# Patient Record
Sex: Male | Born: 1945
Health system: Southern US, Community
[De-identification: ages and names within clinical notes are randomized; demographics above are authoritative.]

## PROBLEM LIST (undated history)

## (undated) DIAGNOSIS — M109 Gout, unspecified: Secondary | ICD-10-CM

## (undated) DIAGNOSIS — E785 Hyperlipidemia, unspecified: Secondary | ICD-10-CM

## (undated) DIAGNOSIS — G20 Parkinson's disease: Secondary | ICD-10-CM

## (undated) DIAGNOSIS — G250 Essential tremor: Secondary | ICD-10-CM

## (undated) DIAGNOSIS — G20A1 Parkinson's disease without dyskinesia, without mention of fluctuations: Secondary | ICD-10-CM

## (undated) DIAGNOSIS — R809 Proteinuria, unspecified: Secondary | ICD-10-CM

## (undated) DIAGNOSIS — R7303 Prediabetes: Secondary | ICD-10-CM

## (undated) DIAGNOSIS — I10 Essential (primary) hypertension: Secondary | ICD-10-CM

## (undated) DIAGNOSIS — E559 Vitamin D deficiency, unspecified: Secondary | ICD-10-CM

## (undated) DIAGNOSIS — I2510 Atherosclerotic heart disease of native coronary artery without angina pectoris: Secondary | ICD-10-CM

## (undated) DIAGNOSIS — K259 Gastric ulcer, unspecified as acute or chronic, without hemorrhage or perforation: Secondary | ICD-10-CM

## (undated) HISTORY — DX: Gastric ulcer, unspecified as acute or chronic, without hemorrhage or perforation: K25.9

## (undated) HISTORY — PX: TONSILLECTOMY: SUR1361

## (undated) HISTORY — DX: Prediabetes: R73.03

## (undated) HISTORY — DX: Gout, unspecified: M10.9

## (undated) HISTORY — PX: CHOLECYSTECTOMY: SHX55

## (undated) HISTORY — DX: Vitamin D deficiency, unspecified: E55.9

## (undated) HISTORY — DX: Parkinson's disease without dyskinesia, without mention of fluctuations: G20.A1

## (undated) HISTORY — DX: Atherosclerotic heart disease of native coronary artery without angina pectoris: I25.10

## (undated) HISTORY — PX: CYST EXCISION: SHX5701

## (undated) HISTORY — DX: Parkinson's disease: G20

## (undated) HISTORY — DX: Proteinuria, unspecified: R80.9

## (undated) HISTORY — DX: Essential tremor: G25.0

## (undated) HISTORY — PX: KNEE SURGERY: SHX244

## (undated) HISTORY — PX: PACEMAKER IMPLANT: EP1218

---

## 2002-03-25 ENCOUNTER — Ambulatory Visit (HOSPITAL_COMMUNITY): Admission: RE | Admit: 2002-03-25 | Discharge: 2002-03-25 | Payer: Self-pay | Admitting: Gastroenterology

## 2004-02-19 ENCOUNTER — Ambulatory Visit (HOSPITAL_COMMUNITY): Admission: RE | Admit: 2004-02-19 | Discharge: 2004-02-19 | Payer: Self-pay | Admitting: Internal Medicine

## 2006-01-20 ENCOUNTER — Ambulatory Visit (HOSPITAL_BASED_OUTPATIENT_CLINIC_OR_DEPARTMENT_OTHER): Admission: RE | Admit: 2006-01-20 | Discharge: 2006-01-20 | Payer: Self-pay | Admitting: Surgery

## 2006-01-20 ENCOUNTER — Encounter (INDEPENDENT_AMBULATORY_CARE_PROVIDER_SITE_OTHER): Payer: Self-pay | Admitting: Specialist

## 2006-11-21 ENCOUNTER — Ambulatory Visit: Payer: Self-pay | Admitting: Cardiovascular Disease

## 2006-12-01 ENCOUNTER — Ambulatory Visit: Payer: Self-pay | Admitting: Cardiovascular Disease

## 2006-12-01 ENCOUNTER — Encounter: Payer: Self-pay | Admitting: Cardiology

## 2006-12-01 ENCOUNTER — Ambulatory Visit: Payer: Self-pay

## 2006-12-14 ENCOUNTER — Ambulatory Visit: Payer: Self-pay | Admitting: Cardiovascular Disease

## 2008-03-06 ENCOUNTER — Encounter: Admission: RE | Admit: 2008-03-06 | Discharge: 2008-03-06 | Payer: Self-pay | Admitting: Internal Medicine

## 2008-04-15 ENCOUNTER — Encounter: Admission: RE | Admit: 2008-04-15 | Discharge: 2008-04-15 | Payer: Self-pay | Admitting: *Deleted

## 2010-10-10 ENCOUNTER — Other Ambulatory Visit: Payer: Self-pay | Admitting: Family Medicine

## 2010-10-10 ENCOUNTER — Ambulatory Visit
Admission: RE | Admit: 2010-10-10 | Discharge: 2010-10-10 | Disposition: A | Discharge status: Home / Self Care | Payer: Self-pay | Source: Ambulatory Visit | Attending: Family Medicine | Admitting: Family Medicine

## 2010-10-10 ENCOUNTER — Encounter: Payer: Self-pay | Admitting: Family Medicine

## 2010-10-10 ENCOUNTER — Ambulatory Visit (INDEPENDENT_AMBULATORY_CARE_PROVIDER_SITE_OTHER): Payer: Self-pay | Admitting: Family Medicine

## 2010-10-10 DIAGNOSIS — M25562 Pain in left knee: Secondary | ICD-10-CM

## 2010-10-10 DIAGNOSIS — M25569 Pain in unspecified knee: Secondary | ICD-10-CM

## 2010-10-10 DIAGNOSIS — I10 Essential (primary) hypertension: Secondary | ICD-10-CM | POA: Insufficient documentation

## 2010-10-19 NOTE — Assessment & Plan Note (Signed)
Summary: Knee stiff/tm(rm3)   Vital Signs:  Patient Profile:   65 Years Old Male CC:      left knee pain Height:     215 inches Weight:      72 pounds O2 Sat:      99 % O2 treatment:    Room Air Temp:     97.5 degrees F oral Resp:     16 per minute BP sitting:   117 / 71  (left arm) Cuff size:   large  Pt. in pain?   yes    Location:   knee(left)    Intensity:   8    Type:       sharp  Vitals Entered By: Linton Flemings RN (October 10, 2010 3:41 PM)                   Updated Prior Medication List: GEMFIBROZIL  POWD (GEMFIBROZIL) twice daily ATENOLOL 50 MG TABS (ATENOLOL) daily COZAAR 100 MG TABS (LOSARTAN POTASSIUM) daily ZETIA 10 MG TABS (EZETIMIBE) daily AIRAVITE 2.5-25-1 MG TABS (FOLIC ACID-VIT B6-VIT B12)  maganesium 500mg  three times a day Vit D 2000 daily Vit B12 Current Allergies: ! ASAHistory of Present Illness Chief Complaint: left knee pain History of Present Illness:  Subjective:  Patient complains of onset of pain/stiffness in left knee 3 days ago after riding a new motorcycle for several hours.  Has pain when attempting to flex knee. + sensation of giving way.  Has been using crutches  REVIEW OF SYSTEMS Constitutional Symptoms      Denies fever, chills, night sweats, weight loss, weight gain, and fatigue.  Eyes       Denies change in vision, eye pain, eye discharge, glasses, contact lenses, and eye surgery. Ear/Nose/Throat/Mouth       Denies hearing loss/aids, change in hearing, ear pain, ear discharge, dizziness, frequent runny nose, frequent nose bleeds, sinus problems, sore throat, hoarseness, and tooth pain or bleeding.  Respiratory       Denies dry cough, productive cough, wheezing, shortness of breath, asthma, bronchitis, and emphysema/COPD.  Cardiovascular       Denies murmurs, chest pain, and tires easily with exhertion.    Gastrointestinal       Denies stomach pain, nausea/vomiting, diarrhea, constipation, blood in bowel movements, and  indigestion. Genitourniary       Denies painful urination, kidney stones, and loss of urinary control. Neurological       Denies paralysis, seizures, and fainting/blackouts. Musculoskeletal       Complains of joint stiffness.      Denies muscle pain, joint pain, decreased range of motion, redness, swelling, muscle weakness, and gout.  Skin       Denies bruising, unusual mles/lumps or sores, and hair/skin or nail changes.  Psych       Denies mood changes, temper/anger issues, anxiety/stress, speech problems, depression, and sleep problems. Other Comments: rode motorcycyle to [place], pain to left knee started friday night   Past History:  Past Medical History: Hyperlipidemia Hypertension  Past Surgical History: right knee x2  Family History: none  Social History: smoke- no alcohol- yes drugs-no   Objective:  No acute distress.  Walks slowly and deliberately; has difficulty bearing weight on left knee.  Left knee:  + mild effusion anteriorly.  No erythema or warmth.  + tenderness anteriorly over patella.   Patient cannot flex knee.  Cannot adequately assess knee because of patient's inabiltity to flex.  Distal neurovascular intact. X-ray left  knee:   IMPRESSION: No acute bony findings. Assessment New Problems: PATELLO-FEMORAL SYNDROME (ICD-719.46) KNEE PAIN, LEFT, ACUTE (ICD-719.46) HYPERTENSION (ICD-401.9) HYPERLIPIDEMIA (ICD-272.4)   Plan New Medications/Changes: VOLTAREN 1 % GEL (DICLOFENAC SODIUM) Apply four grams to affected area   three times a day to four times a day  #100gm x 2, 10/10/2010, Donna Christen MD  New Orders: T-DG Knee 2 Views*L* [73560] New Patient Level III [04540] Planning Comments:   Continue crutches for 3 to 5 days.  Continue applying ice packs several times daily.  Begin Voltaren gel.  Tylenol for pain at night.  When improved may use a neoprene knee sleeve.  When improved, begin knee exercises ([employer] information and  instruction patient handout given)  Follow-up with sports med clinic if not improving 2 weeks.   The patient and/or caregiver has been counseled thoroughly with regard to medications prescribed including dosage, schedule, interactions, rationale for use, and possible side effects and they verbalize understanding.  Diagnoses and expected course of recovery discussed and will return if not improved as expected or if the condition worsens. Patient and/or caregiver verbalized understanding.  Prescriptions: VOLTAREN 1 % GEL (DICLOFENAC SODIUM) Apply four grams to affected area   three times a day to four times a day  #100gm x 2   Entered and Authorized by:   Donna Christen MD   Signed by:   Donna Christen MD on 10/10/2010   Method used:   Print then Give to Patient   RxID:   [id number]   Orders Added: 1)  T-DG Knee 2 Views*L* [73560] 2)  New Patient Level III [08657]

## 2010-12-21 NOTE — Assessment & Plan Note (Signed)
[facility]                            CARDIOLOGY OFFICE NOTE   NAME:Barno, ELISHUA RADFORD                       MRN:          [id number]  DATE:12/14/2006                            DOB:          [birth year]    Pacen Watford was seen back in followup at the [facility] Office  on Dec 14, 2006.  He was initially evaluated here on November 21, 2006 for  exertional dyspnea.  He has underlying cardiac risk factors of  hypertension and dyslipidemia.  His physical exam and EKG were  unrevealing.  He underwent some diagnostic testing that included a chest  x-ray which showed no active lung disease.  He had a transthoracic  echocardiogram performed on December 01, 2006 that demonstrated normal left  ventricular systolic function with a left ventricular ejection fraction  in the range of 50-55% without regional wall motion abnormalities.  There was the mention of a density on the aortic valve that likely  represented artifact.  His diastolic filling parameters were normal.  He  also underwent an adenosine Myoview stress study on December 01, 2006 which  was negative for ischemia.  The left ventricular ejection fraction was  calculated at 56%.  There was no sign of scar of myocardial ischemia.   From a symptomatic standpoint, Mr. Gatliff is doing better.  He has  appreciated less dyspnea since his office visit.  He has not started his  exercise program because he wanted to make sure all of the tests had  good results prior to initiating regular exercise.  He has not had any  chest pain, orthopnea, PND, edema, or other complaints.   MEDICATIONS:  1. Colchicine 0.6 mg twice daily.  2. Hyzaar 100/25 mg daily.  3. Crestor 40 mg one-half daily.  4. Atenolol 50 mg daily.  5. Zetia 10 mg at bedtime.  6. Claritin 10 mg daily.  7. Cortisone cream 0.05% twice daily.  8. Fish oil 1200 mg, two daily.   ALLERGIES:  ASPIRIN.   PHYSICAL EXAMINATION:  GENERAL:  He is an alert and  oriented, well-  appearing male in no acute distress.  VITAL SIGNS:  Weight is 229 pounds.  Blood pressure 110/70, heart rate  68.   The remainder of the exam was deferred today, as our time today was  spent in discussion.   Mr. Mainville is a 65 year old man with mild shortness of breath that is  likely secondary to deconditioning.  He does not have any structural  heart disease.  His diastolic parameters on his echocardiogram are  normal, and he does not appear to have any signs of diastolic  dysfunction from hypertension.  His hypertension is under optimal  control on his current medication, and Dr. Oneta Rack is treating his  dyslipidemia aggressively as well.  We reviewed in detail today  approaches to a lifelong exercise program.  He certainly does not have  any evidence of cardiac disease and can confidently begin regular  exercise.   I would be happy to see Mr. Pryer back at any time if he develops  problems.  Otherwise,  I will plan on seeing him back just on an as  needed basis.     Veverly Fells. Excell Seltzer, MD     MDC/MedQ  DD: 12/14/2006  DT: 12/14/2006  Job #: [id number]   cc:   Lucky Cowboy, M.D.

## 2010-12-24 NOTE — Letter (Signed)
November 21, 2006    Lucky Cowboy, M.D.  [address], [address]  [place], [place] [address]   RE:  CRISTHIAN, VANHOOK  MRN:  [id number]  /  DOB:  Sep 24, 1945   Dear Dr. Oneta Rack:   It was my pleasure to see Leroy Griffin at the [facility]  in outpatient consultation on November 21, 2006.   As you know, Leroy Griffin is a 65 year old gentleman who presents here  with a chief complaint of shortness of breath.   Leroy Griffin has noted slowly progressive exertional dyspnea over the last  6-8 weeks. He has noticed this with activities such as mowing his lawn.  He occasionally has resting symptoms where his breathing feels  restricted. He does admit to some tightness in his chest but thinks this  may be related to his breathing. This does not seem to be a prominent  complaint for him.   He has not had any orthopnea, PND, edema, cough, fever or chills. He has  had no prolonged travel or recent surgeries.   He has lost a significant amount of weight over the last 1-1/2 years by  using [employer]. His weight has gone from 280 pounds down to 236  pounds. He informs me that he has a goal weight of 205-210 pounds. He  has no history of cardiac disease or cardiac problems. He was told by  his pediatrician when he was young that he had a heart murmur but this  has not been describes as an adult. He has no other complaints at  today's visit.   PAST MEDICAL HISTORY:  1. Essential hypertension. He has had hypertension for many years and      is on multi-drug therapy with good control. He reports blood      pressures generally in the range of 120/80.  2. Dyslipidemia. Currently on Crestor, Zetia and fish oil.  3. History of bleeding gastric ulcers related to nonsteroidal      antiinflammatory use.  4. Multiple right knee surgeries 5-10 years ago.  5. Cyst removal from the back approximately 1 year ago.  6. Arthritis.  7. Gout.  8. Hiatal hernia.   CURRENT MEDICATIONS:  1.  Colchicine 0.6 mg twice daily.  2. Hyzaar 100/25 mg daily.  3. Crestor daily.  4. Atenolol daily.  5. Zetia 10 mg at bedtime.  6. Fish oil 1000 mg 2 daily.  7. Claritin daily.  8. Cortisone cream twice daily.   ALLERGIES:  Aspirin and Nonsteroidals are listed but the intolerance is  due to bleeding ulcers an does not represent a true drug allergy.   SOCIAL HISTORY:  The patient is married with 1 child. He works as an  Banker for [employer]. He does not engage in regular  exercise. He is a remote smoker from the 33s. He has no history of  drug use and he drinks 1 alcoholic beverage daily. He drinks  approximately 2 cups of caffeinated beverages daily.   FAMILY HISTORY:  There is longevity in the family as his mother died at  age 62 of old age and father died at age 33. His father had a stroke at  age 70. He has 3 siblings who are in fairly good health. His 42 year old  brother has emphysema. His sisters have no medical problems and are ages  57 and 9. There is no coronary artery disease in the family.   REVIEW OF SYSTEMS:  A complete 12-point review of systems  was performed.  Pertinent positives included only aches and pains from arthritis and  gout as well as shortness of breath relating to his chief complaint. No  other pertinent positives to report.   PHYSICAL EXAMINATION:  GENERAL:  The patient is alert and oriented. He  is in no acute distress. He is a well-groomed gentleman.  VITAL SIGNS:  Weight is 236 pounds, blood pressure is 112/64, heart rate  is 56, respiratory rate is 12.  HEENT:  Normal.  NECK:  Normal carotid upstrokes without bruit. Jugular venous pressure  is normal with no thyromegaly or thyroid nodules.  LUNGS:  Clear to auscultation bilaterally.  HEART:  The apex is discreet and nondisplaced. There is no right  ventricular heave or lift. The heart is regular rate and rhythm without  murmurs or gallops.  ABDOMEN:  Soft, nontender, no  organomegaly, no masses, no abdominal  bruits.  BACK:  There is no paraspinal or flank tenderness.  EXTREMITIES:  No clubbing, cyanosis or edema. Peripheral pulses are 2+  and equal throughout. There are no femoral artery bruits.  SKIN:  Warm and dry without rash.  LYMPHATICS:  There is no adenopathy.  NEUROLOGIC:  Cranial nerves II-XII are intact. Strength is 5/5 and equal  in the arms and legs bilaterally.   EKG shows sinus bradycardia and is otherwise within normal limits.   ASSESSMENT:  Leroy Griffin is a 65 year old gentleman with exertional  dyspnea. Differential diagnosis is broad and includes myocardial  ischemia, left ventricular dysfunction or diastolic congestive heart  failure or a primary lung problem. Will start his evaluation with a  chest x-ray, echocardiogram and adenosine Myoview. If his echo and  Myoview study are normal, this would rule out any significant cardiac  component to his shortness of breath. He is at risk for coronary artery  disease in the setting of his risk factors of hypertension and  dyslipidemia even though he does not have classic symptoms. He is at  risk of diastolic heart failure in the setting of his hypertension  although it appears that his control is quite good. I will plan on  following up with Leroy Griffin after his studies are completed. I did not  detect any abnormalities on his physical examination today and his EKG  is normal. Both of these findings were reassuring.   Dr. Oneta Rack, thanks again for allowing me the opportunity to see Mr.  Griffin. Please feel free to call at any time with questions regarding  his care.    Sincerely,      Veverly Fells. Excell Seltzer, MD  Electronically Signed    MDC/MedQ  DD: 11/21/2006  DT: 11/21/2006  Job #: [id number]

## 2010-12-24 NOTE — Op Note (Signed)
   Leroy Griffin, Leroy Griffin                         ACCOUNT NO.:  [id number]   MEDICAL RECORD NO.:  [id number]                   PATIENT TYPE:  AMB   LOCATION:  ENDO                                 FACILITY:  [facility]   PHYSICIAN:  James L. Malon Kindle., M.D.          DATE OF BIRTH:  12/17/1945   DATE OF PROCEDURE:  03/25/2002  DATE OF DISCHARGE:                                 OPERATIVE REPORT   PROCEDURE:  Colonoscopy,.   MEDICATIONS:  Fentanyl 90 mcg, Versed 8 mg IV.   INDICATIONS FOR PROCEDURE:  Colon cancer screening.   DESCRIPTION OF PROCEDURE:  The procedure had been explained to the patient  and consent obtained. With the patient in the left lateral decubitus  position, the Olympus pediatric adjustable colonoscope was inserted and  advanced under direct visualization. The prep was excellent. We arrived at  the cecum without difficulty. The ileocecal valve and appendiceal orifice  were seen. The scope was withdrawn and the cecum, ascending colon, hepatic  flexure, transverse colon, splenic flexure, descending and sigmoid colon  were seen well. No polyps or other lesions were seen. The rectum was also  free of polyps. The scope was withdrawn and the patient tolerated the  procedure well.   ASSESSMENT:  1. Essentially normal colonoscopy.   PLAN:  Routine follow-up. Recommend yearly Hemoccults.  Consider colonoscopy  at age 4.                                               James L. Malon Kindle., M.D.    Waldron Session  D:  03/25/2002  T:  03/25/2002  Job:  [id number]   cc:   Marinus Maw, M.D.

## 2010-12-24 NOTE — Op Note (Signed)
Leroy Griffin, Leroy Griffin                ACCOUNT NO.:  [id number]   MEDICAL RECORD NO.:  [id number]          PATIENT TYPE:  AMB   LOCATION:  DSC                          FACILITY:  [facility]   PHYSICIAN:  Wilmon Arms. Corliss Skains, M.D. DATE OF BIRTH:  1946-07-03   DATE OF PROCEDURE:  01/20/2006  DATE OF DISCHARGE:                                 OPERATIVE REPORT   PREOPERATIVE DIAGNOSIS:  1.  Sebaceous cyst scalp.  2.  Chronic infected sebaceous cyst mid-back.   POSTOPERATIVE DIAGNOSIS:  1.  Sebaceous cyst scalp.  2.  Chronic infected sebaceous cyst mid-back.   PROCEDURE PERFORMED:  Excision of sebaceous cyst from the scalp and the mid-  back.   SURGEON:  Wilmon Arms. Tsuei, M.D.   ANESTHESIA:  General endotracheal.   INDICATIONS:  The patient is a 65 year old male in excellent health who  recently had a sebaceous cyst on his mid-back that became infected.  This  was drained and the inflammation has resolved.  He also has had a cyst on  the posterior right scalp for some time.  He requests excision of both of  these.   DESCRIPTION OF PROCEDURE:  The patient is brought to the operating room and  placed in supine position on stretcher.  After adequate general anesthesia  was obtained he is rotated to a prone position.  A small area of hair was  shaved around the scalp cyst.  This area was prepped with Betadine and  draped in sterile fashion.  An incision was made directly over the cyst  carried down to the capsule of cyst.  This was bluntly dissected free and  was delivered intact.  Hemostasis obtained with cautery.  The wound was then  closed with interrupted 4-0 Ethilon sutures.  The back was then prepped with  Betadine and draped in sterile fashion.  An elliptical incision was made  around the skin opening.  Dissection was carried down into the subcutaneous  tissues.  Cautery was used to excise the entire inflamed scarred cyst back  to healthy tissue.  Hemostasis was obtained with cautery.   The wound was  closed with 4-0 Ethilon.  Antibiotic ointment clean dressings were applied.  The patient was then extubated and brought to recovery room in stable  condition.      Wilmon Arms. Tsuei, M.D.  Electronically Signed     MKT/MEDQ  D:  01/20/2006  T:  01/20/2006  Job:  [id number]

## 2011-03-07 ENCOUNTER — Other Ambulatory Visit (HOSPITAL_COMMUNITY): Payer: Self-pay | Admitting: Internal Medicine

## 2011-03-07 ENCOUNTER — Ambulatory Visit (HOSPITAL_COMMUNITY)
Admission: RE | Admit: 2011-03-07 | Discharge: 2011-03-07 | Disposition: A | Discharge status: Home / Self Care | Payer: Medicare Other | Source: Ambulatory Visit | Attending: Internal Medicine | Admitting: Internal Medicine

## 2011-03-07 DIAGNOSIS — M545 Low back pain, unspecified: Secondary | ICD-10-CM | POA: Insufficient documentation

## 2011-03-07 DIAGNOSIS — R52 Pain, unspecified: Secondary | ICD-10-CM

## 2011-08-15 DIAGNOSIS — M109 Gout, unspecified: Secondary | ICD-10-CM | POA: Diagnosis not present

## 2011-08-15 DIAGNOSIS — I10 Essential (primary) hypertension: Secondary | ICD-10-CM | POA: Diagnosis not present

## 2011-09-06 DIAGNOSIS — R7309 Other abnormal glucose: Secondary | ICD-10-CM | POA: Diagnosis not present

## 2011-09-06 DIAGNOSIS — M109 Gout, unspecified: Secondary | ICD-10-CM | POA: Diagnosis not present

## 2011-09-06 DIAGNOSIS — E782 Mixed hyperlipidemia: Secondary | ICD-10-CM | POA: Diagnosis not present

## 2011-09-06 DIAGNOSIS — I10 Essential (primary) hypertension: Secondary | ICD-10-CM | POA: Diagnosis not present

## 2011-09-06 DIAGNOSIS — Z79899 Other long term (current) drug therapy: Secondary | ICD-10-CM | POA: Diagnosis not present

## 2011-09-06 DIAGNOSIS — E559 Vitamin D deficiency, unspecified: Secondary | ICD-10-CM | POA: Diagnosis not present

## 2011-10-25 DIAGNOSIS — M109 Gout, unspecified: Secondary | ICD-10-CM | POA: Diagnosis not present

## 2011-10-25 DIAGNOSIS — R809 Proteinuria, unspecified: Secondary | ICD-10-CM | POA: Diagnosis not present

## 2011-11-01 DIAGNOSIS — R809 Proteinuria, unspecified: Secondary | ICD-10-CM | POA: Diagnosis not present

## 2011-11-21 DIAGNOSIS — I10 Essential (primary) hypertension: Secondary | ICD-10-CM | POA: Diagnosis not present

## 2011-11-21 DIAGNOSIS — E782 Mixed hyperlipidemia: Secondary | ICD-10-CM | POA: Diagnosis not present

## 2011-11-21 DIAGNOSIS — M109 Gout, unspecified: Secondary | ICD-10-CM | POA: Diagnosis not present

## 2011-11-21 DIAGNOSIS — R7309 Other abnormal glucose: Secondary | ICD-10-CM | POA: Diagnosis not present

## 2011-11-21 DIAGNOSIS — E559 Vitamin D deficiency, unspecified: Secondary | ICD-10-CM | POA: Diagnosis not present

## 2011-11-21 DIAGNOSIS — Z79899 Other long term (current) drug therapy: Secondary | ICD-10-CM | POA: Diagnosis not present

## 2011-11-25 DIAGNOSIS — R809 Proteinuria, unspecified: Secondary | ICD-10-CM | POA: Diagnosis not present

## 2012-01-09 DIAGNOSIS — R002 Palpitations: Secondary | ICD-10-CM | POA: Diagnosis not present

## 2012-01-09 DIAGNOSIS — I10 Essential (primary) hypertension: Secondary | ICD-10-CM | POA: Diagnosis not present

## 2012-01-09 DIAGNOSIS — E782 Mixed hyperlipidemia: Secondary | ICD-10-CM | POA: Diagnosis not present

## 2012-01-09 DIAGNOSIS — R809 Proteinuria, unspecified: Secondary | ICD-10-CM | POA: Diagnosis not present

## 2012-01-09 DIAGNOSIS — M109 Gout, unspecified: Secondary | ICD-10-CM | POA: Diagnosis not present

## 2012-01-18 DIAGNOSIS — R809 Proteinuria, unspecified: Secondary | ICD-10-CM | POA: Diagnosis not present

## 2012-02-03 LAB — HM COLONOSCOPY

## 2012-03-22 DIAGNOSIS — R5381 Other malaise: Secondary | ICD-10-CM | POA: Diagnosis not present

## 2012-03-22 DIAGNOSIS — E782 Mixed hyperlipidemia: Secondary | ICD-10-CM | POA: Diagnosis not present

## 2012-03-22 DIAGNOSIS — R5383 Other fatigue: Secondary | ICD-10-CM | POA: Diagnosis not present

## 2012-03-22 DIAGNOSIS — L639 Alopecia areata, unspecified: Secondary | ICD-10-CM | POA: Diagnosis not present

## 2012-03-22 DIAGNOSIS — E559 Vitamin D deficiency, unspecified: Secondary | ICD-10-CM | POA: Diagnosis not present

## 2012-03-22 DIAGNOSIS — E538 Deficiency of other specified B group vitamins: Secondary | ICD-10-CM | POA: Diagnosis not present

## 2012-03-22 DIAGNOSIS — L259 Unspecified contact dermatitis, unspecified cause: Secondary | ICD-10-CM | POA: Diagnosis not present

## 2012-03-22 DIAGNOSIS — I10 Essential (primary) hypertension: Secondary | ICD-10-CM | POA: Diagnosis not present

## 2012-03-22 DIAGNOSIS — R7309 Other abnormal glucose: Secondary | ICD-10-CM | POA: Diagnosis not present

## 2012-03-22 DIAGNOSIS — D649 Anemia, unspecified: Secondary | ICD-10-CM | POA: Diagnosis not present

## 2012-03-22 DIAGNOSIS — M109 Gout, unspecified: Secondary | ICD-10-CM | POA: Diagnosis not present

## 2012-04-06 DIAGNOSIS — R809 Proteinuria, unspecified: Secondary | ICD-10-CM | POA: Diagnosis not present

## 2012-05-04 DIAGNOSIS — R809 Proteinuria, unspecified: Secondary | ICD-10-CM | POA: Diagnosis not present

## 2012-06-11 ENCOUNTER — Emergency Department (INDEPENDENT_AMBULATORY_CARE_PROVIDER_SITE_OTHER)
Admission: EM | Admit: 2012-06-11 | Discharge: 2012-06-11 | Disposition: A | Discharge status: Home / Self Care | Payer: Medicare Other | Source: Home / Self Care | Attending: Family Medicine | Admitting: Family Medicine

## 2012-06-11 ENCOUNTER — Emergency Department (INDEPENDENT_AMBULATORY_CARE_PROVIDER_SITE_OTHER): Payer: Medicare Other

## 2012-06-11 ENCOUNTER — Ambulatory Visit (INDEPENDENT_AMBULATORY_CARE_PROVIDER_SITE_OTHER): Payer: Medicare Other | Admitting: Sports Medicine

## 2012-06-11 ENCOUNTER — Encounter: Payer: Self-pay | Admitting: Emergency Medicine

## 2012-06-11 DIAGNOSIS — R209 Unspecified disturbances of skin sensation: Secondary | ICD-10-CM

## 2012-06-11 DIAGNOSIS — M25512 Pain in left shoulder: Secondary | ICD-10-CM | POA: Insufficient documentation

## 2012-06-11 DIAGNOSIS — M25519 Pain in unspecified shoulder: Secondary | ICD-10-CM | POA: Diagnosis not present

## 2012-06-11 DIAGNOSIS — M75100 Unspecified rotator cuff tear or rupture of unspecified shoulder, not specified as traumatic: Secondary | ICD-10-CM

## 2012-06-11 DIAGNOSIS — R200 Anesthesia of skin: Secondary | ICD-10-CM

## 2012-06-11 DIAGNOSIS — M67919 Unspecified disorder of synovium and tendon, unspecified shoulder: Secondary | ICD-10-CM | POA: Diagnosis not present

## 2012-06-11 DIAGNOSIS — M898X9 Other specified disorders of bone, unspecified site: Secondary | ICD-10-CM

## 2012-06-11 DIAGNOSIS — M719 Bursopathy, unspecified: Secondary | ICD-10-CM | POA: Diagnosis not present

## 2012-06-11 HISTORY — DX: Hyperlipidemia, unspecified: E78.5

## 2012-06-11 HISTORY — DX: Essential (primary) hypertension: I10

## 2012-06-11 MED ORDER — TRAMADOL HCL 50 MG PO TABS: 50.0000 mg | ORAL_TABLET | Freq: Four times a day (QID) | ORAL | Status: DC | PRN

## 2012-06-11 NOTE — Assessment & Plan Note (Signed)
Symptoms are classic for rotator cuff dysfunction. I do think his subscapularis and supraspinatus are involved. We'll start conservatively with rehabilitation exercises, and tramadol. He is unable to take NSAIDs due to GI bleeding requiring hospitalization and transfusions in the past. If he is no better at his followup visit, we will ultrasound, and possibly inject the subacromial bursa.

## 2012-06-11 NOTE — Progress Notes (Signed)
SPORTS MEDICINE CONSULTATION REPORT   Subjective:    I'm seeing this patient as a consultation for:  Dr. Cathren Harsh  CC: Left shoulder pain  HPI: Leroy Griffin is a very pleasant 66 year old male comes in with a one-year history of pain that he localizes over his left lateral deltoid. This pain does not wake him up at night, he is unable to reach overhead. The pain is localized, does not radiate, is moderate in nature. He denies any associated injuries. He's never been treated for this before.  Numbness and tingling: Notes numbness and tingling for many years in the last 2 fingers. He does note that he has a known cervical degenerative disc disease. He is agreeable to discuss this at a future visit.  Past medical history, Surgical history, Family history, Social history, Allergies, and medications have been entered into the medical record, reviewed, and no changes needed.   Review of Systems: No headache, visual changes, nausea, vomiting, diarrhea, constipation, dizziness, abdominal pain, skin rash, fevers, chills, night sweats, weight loss, swollen lymph nodes, body aches, joint swelling, muscle aches, chest pain, or shortness of breath.   Objective:   Vitals:  Afebrile, vital signs stable. General: Well Developed, well nourished, and in no acute distress.  Neuro/Psych: Alert and oriented x3, extra-ocular muscles intact, able to move all 4 extremities.  Skin: Warm and dry, no rashes noted.  Respiratory: Not using accessory muscles, speaking in full sentences, trachea midline.  Cardiovascular: Pulses palpable, no extremity edema. Abdomen: Does not appear distended. Left Shoulder: Inspection reveals no abnormalities, atrophy or asymmetry. Palpation is normal with no tenderness over AC joint or bicipital groove. ROM is limited in internal rotation to lower lumbar. Rotator cuff strength weak to abduction, internal rotation. Resisted internal rotation is very painful with a positive liftoff  sign. Positive Hawkins, and if they can sign with weakness, negative Neer sign. Speeds test is positive, Yergason test negative. No labral pathology noted with negative Obrien's, negative clunk and good stability. Normal scapular function observed. Positive painful arc without a drop arm sign No apprehension sign  X-rays reviewed by me, she'll mild glenohumeral degenerative changes, and some subacromial spurring, but type I acromion. No other signs of fracture dislocation. A small piece of wire was noted in his mid lateral upper arm, he was not tender over this area   Impression and Recommendations:   This case required medical decision making of moderate complexity.

## 2012-06-11 NOTE — ED Notes (Signed)
Left shoulder pain x 5 days.

## 2012-06-11 NOTE — Assessment & Plan Note (Signed)
Symptoms are suggestive of a bilateral C8 radiculopathy. With a history of cervical degenerative disc disease, and bilateral symptoms I would be worried about cervical spinal stenosis other than a lateralizing disc protrusion.

## 2012-06-11 NOTE — ED Provider Notes (Signed)
History     CSN: [id number]  Arrival date & time 06/11/12  1059   First MD Initiated Contact with Patient 06/11/12 1136      Chief Complaint  Patient presents with  . Shoulder Pain      HPI Comments: Patient states that about 5 days ago he was filling a gas tank, and later noticed pain and decrease range of motion in his left shoulder that has persisted.  He is left-handed, and states that he has had increasing frequency of episodes of pain in his left shoulder that generally improve spontaneously.  His last episode of left shoulder pain was about 6 weeks ago.  He recalls no injury.  Patient is a 66 y.o. male presenting with shoulder pain. The history is provided by the patient.  Shoulder Pain This is a recurrent problem. Episode onset: 5 days ago. The problem occurs constantly. The problem has been gradually worsening. Associated symptoms comments: none. Exacerbated by: raising his left arm. Nothing relieves the symptoms. Treatments tried: heat. The treatment provided no relief.    Past Medical History  Diagnosis Date  . Hypertension   . Hyperlipidemia     Past Surgical History  Procedure Date  . Rt knee     Family History  Problem Relation Age of Onset  . Dementia Mother   . Stroke Father     History  Substance Use Topics  . Smoking status: Never Smoker   . Smokeless tobacco: Not on file  . Alcohol Use: Yes      Review of Systems  All other systems reviewed and are negative.    Allergies  Aspirin  Home Medications   Current Outpatient Rx  Name  Route  Sig  Dispense  Refill  . ALLOPURINOL 300 MG PO TABS   Oral   Take 300 mg by mouth daily.         . ATENOLOL 50 MG PO TABS   Oral   Take 50 mg by mouth daily.         Marland Kitchen EZETIMIBE 10 MG PO TABS   Oral   Take 10 mg by mouth daily.         Marland Kitchen LOSARTAN POTASSIUM 50 MG PO TABS   Oral   Take 50 mg by mouth daily.         Marland Kitchen ROSUVASTATIN CALCIUM 10 MG PO TABS   Oral   Take 10 mg by mouth  daily.         . TRAMADOL HCL 50 MG PO TABS   Oral   Take 1 tablet (50 mg total) by mouth every 6 (six) hours as needed for pain.   50 tablet   2     BP 129/74  Pulse 60  Temp 97.7 F (36.5 C) (Oral)  Resp 16  Ht 6' (1.829 m)  Wt 225 lb (102.059 kg)  BMI 30.52 kg/m2  SpO2 97%  Physical Exam  Nursing note and vitals reviewed. Constitutional: He is oriented to person, place, and time. He appears well-developed and well-nourished. No distress.  HENT:  Head: Atraumatic.  Eyes: Conjunctivae normal are normal. Pupils are equal, round, and reactive to light.  Neck: Normal range of motion.  Cardiovascular: Normal rate, regular rhythm and normal heart sounds.   Pulmonary/Chest: Effort normal and breath sounds normal.  Musculoskeletal:       Left shoulder: He exhibits decreased range of motion, tenderness, pain and decreased strength. He exhibits no bony tenderness, no swelling, no effusion,  no crepitus, no deformity, no laceration, no spasm and normal pulse.       Patient has significant difficulty abducting his left shoulder above horizontal.  There is tenderness over the insertion of long head of biceps tendon.  He has difficulty with Aply's test.  Lift off test is positive for subscapularis dysfunction.  Empty can test is positive.  Distal Neurovascular function is intact.   Neurological: He is alert and oriented to person, place, and time.  Skin: Skin is warm and dry. No rash noted.    ED Course  Procedures none   Dg Shoulder Left  06/11/2012  *RADIOLOGY REPORT*  Clinical Data: Progressive chronic left shoulder pain.  Decreased range of motion.  LEFT SHOULDER - 2+ VIEW  Comparison: None.  Findings: Glenohumeral joint appears normal.  No soft tissue calcifications.  There is a small osteophyte on the inferior surface of the distal clavicle.  AC joint is otherwise normal.  There is a broken wire fragment in the soft tissues of the posterior aspect of the left upper arm.   IMPRESSION: Minimal osteophyte formation on the distal clavicle.  Osseous structures are otherwise normal.  Broken wire fragment in the soft tissues of the posterior aspect of the left upper arm.   Original Report Authenticated By: Francene Boyers, M.D.      1. Rotator cuff syndrome       MDM  Because of the patient's increasing frequency of episodes of shoulder pain, and significantly decreased left shoulder function today, will obtain consultation with Dr. Rodney Langton for more comprehensive management.        Lattie Haw, MD 06/12/12 1700

## 2012-06-15 DIAGNOSIS — R809 Proteinuria, unspecified: Secondary | ICD-10-CM | POA: Diagnosis not present

## 2012-06-19 DIAGNOSIS — Z23 Encounter for immunization: Secondary | ICD-10-CM | POA: Diagnosis not present

## 2012-06-19 DIAGNOSIS — E782 Mixed hyperlipidemia: Secondary | ICD-10-CM | POA: Diagnosis not present

## 2012-06-19 DIAGNOSIS — Z79899 Other long term (current) drug therapy: Secondary | ICD-10-CM | POA: Diagnosis not present

## 2012-06-19 DIAGNOSIS — I10 Essential (primary) hypertension: Secondary | ICD-10-CM | POA: Diagnosis not present

## 2012-06-19 DIAGNOSIS — Z125 Encounter for screening for malignant neoplasm of prostate: Secondary | ICD-10-CM | POA: Diagnosis not present

## 2012-06-19 DIAGNOSIS — M109 Gout, unspecified: Secondary | ICD-10-CM | POA: Diagnosis not present

## 2012-06-19 DIAGNOSIS — E559 Vitamin D deficiency, unspecified: Secondary | ICD-10-CM | POA: Diagnosis not present

## 2012-06-19 DIAGNOSIS — R7309 Other abnormal glucose: Secondary | ICD-10-CM | POA: Diagnosis not present

## 2012-06-19 DIAGNOSIS — Z1212 Encounter for screening for malignant neoplasm of rectum: Secondary | ICD-10-CM | POA: Diagnosis not present

## 2012-06-20 DIAGNOSIS — L639 Alopecia areata, unspecified: Secondary | ICD-10-CM | POA: Diagnosis not present

## 2012-06-27 DIAGNOSIS — I10 Essential (primary) hypertension: Secondary | ICD-10-CM | POA: Diagnosis not present

## 2012-06-27 DIAGNOSIS — R809 Proteinuria, unspecified: Secondary | ICD-10-CM | POA: Diagnosis not present

## 2012-07-09 ENCOUNTER — Ambulatory Visit (INDEPENDENT_AMBULATORY_CARE_PROVIDER_SITE_OTHER): Payer: Medicare Other | Admitting: Sports Medicine

## 2012-07-09 ENCOUNTER — Encounter: Payer: Self-pay | Admitting: Sports Medicine

## 2012-07-09 VITALS — BP 129/76 | HR 69 | Wt 220.0 lb

## 2012-07-09 DIAGNOSIS — M25519 Pain in unspecified shoulder: Secondary | ICD-10-CM

## 2012-07-09 DIAGNOSIS — M25512 Pain in left shoulder: Secondary | ICD-10-CM

## 2012-07-09 NOTE — Assessment & Plan Note (Signed)
Pain resolved with ultrasound-guided subacromial bursa injection. This confirms the rotator cuff is the pain generator. He should take it easy for a week, but continue the rehabilitation exercises. I will see him back in 4 weeks. He cannot take NSAIDs due to prior GI bleeds.

## 2012-07-09 NOTE — Progress Notes (Signed)
SPORTS MEDICINE CONSULTATION REPORT  Subjective:    CC: Followup  HPI: This pleasant patient comes back after about 4 weeks of rotator cuff rehabilitation for his left shoulder. He has been unable to use NSAIDs due to prior GI bleeds. Overall he notes he is about 50% better, but this does not work he wants to be. He also has some radicular type symptoms, but exam at my initial consultation was predominantly rotator cuff. The pain is localized over the left deltoid, worse with overhead activities, and reaching behind his back. Pain does not radiate, is moderate.  Past medical history, Surgical history, Family history, Social history, Allergies, and medications have been entered into the medical record, reviewed, and no changes needed.   Review of Systems: No headache, visual changes, nausea, vomiting, diarrhea, constipation, dizziness, abdominal pain, skin rash, fevers, chills, night sweats, weight loss, swollen lymph nodes, body aches, joint swelling, muscle aches, chest pain, shortness of breath, mood changes, visual or auditory hallucinations.   Objective:   Vitals:  Afebrile, vital signs stable. General: Well Developed, well nourished, and in no acute distress.  Neuro/Psych: Alert and oriented x3, extra-ocular muscles intact, able to move all 4 extremities.  Skin: Warm and dry, no rashes noted.  Respiratory: Not using accessory muscles, speaking in full sentences, trachea midline.  Cardiovascular: Pulses palpable, no extremity edema. Abdomen: Does not appear distended. Left Shoulder: Inspection reveals no abnormalities, atrophy or asymmetry. Palpation is normal with no tenderness over AC joint or bicipital groove. ROM is limited in internal rotation to lower lumbar. Rotator cuff strength weak to abduction, internal rotation. Resisted internal rotation is very painful with a positive liftoff sign. Positive Hawkins, and if they can sign with weakness, negative Neer sign. Speeds test is  positive, Yergason test negative. No labral pathology noted with negative Obrien's, negative clunk and good stability. Normal scapular function observed. Positive painful arc without a drop arm sign No apprehension sign  Procedure: Real-time Ultrasound Guided Injection of left subacromial bursa Device: GE Logiq E  Ultrasound guided injection is preferred based studies that show increased duration, increased effect, greater accuracy, decreased procedural pain, increased response rate, and decreased cost with ultrasound guided versus blind injection.  Verbal informed consent obtained.  Time-out conducted.  Noted no overlying erythema, induration, or other signs of local infection.  Skin prepped in a sterile fashion.  Local anesthesia: Topical Ethyl chloride.  With sterile technique and under real time ultrasound guidance:  1 cc Kenalog 40, 4 cc lidocaine injected into the bursa. Completed without difficulty  Pain immediately resolved suggesting accurate placement of the medication.  Advised to call if fevers/chills, erythema, induration, drainage, or persistent bleeding.  Images permanently stored and available for review in the ultrasound unit.  Impression: Technically successful ultrasound guided injection.  Impression and Recommendations:   This case required medical decision making of moderate complexity.

## 2012-07-25 DIAGNOSIS — L639 Alopecia areata, unspecified: Secondary | ICD-10-CM | POA: Diagnosis not present

## 2012-08-09 ENCOUNTER — Encounter: Payer: Self-pay | Admitting: Sports Medicine

## 2012-08-09 ENCOUNTER — Ambulatory Visit (INDEPENDENT_AMBULATORY_CARE_PROVIDER_SITE_OTHER): Payer: Medicare Other | Admitting: Sports Medicine

## 2012-08-09 VITALS — BP 138/85 | HR 50 | Wt 218.0 lb

## 2012-08-09 DIAGNOSIS — S46219A Strain of muscle, fascia and tendon of other parts of biceps, unspecified arm, initial encounter: Secondary | ICD-10-CM

## 2012-08-09 DIAGNOSIS — M25519 Pain in unspecified shoulder: Secondary | ICD-10-CM | POA: Diagnosis not present

## 2012-08-09 DIAGNOSIS — M25512 Pain in left shoulder: Secondary | ICD-10-CM

## 2012-08-09 NOTE — Progress Notes (Signed)
SPORTS MEDICINE CONSULTATION REPORT  Subjective:    CC: Followup  HPI: Left shoulder pain: Pain is 80% resolved, he has no pain at night, and no pain at rest after ultrasound-guided subacromial injection. He is very happy with the results.  Left biceps tendon rupture: Interestingly, 3 weeks ago he reached up with his left hand to swat a flight from the ceiling. He felt a pop, a short period of immediate pain, which then resolved. And then had some bruising. He now has a lump in his distal upper arm. He denies pain, and denies any loss of function.  Past medical history, Surgical history, Family history, Social history, Allergies, and medications have been entered into the medical record, reviewed, and no changes needed.   Review of Systems: No headache, visual changes, nausea, vomiting, diarrhea, constipation, dizziness, abdominal pain, skin rash, fevers, chills, night sweats, weight loss, swollen lymph nodes, body aches, joint swelling, muscle aches, chest pain, shortness of breath, mood changes, visual or auditory hallucinations.   Objective:   Vitals:  Afebrile, vital signs stable. General: Well Developed, well nourished, and in no acute distress.  Neuro/Psych: Alert and oriented x3, extra-ocular muscles intact, able to move all 4 extremities.  Skin: Warm and dry, no rashes noted.  Respiratory: Not using accessory muscles, speaking in full sentences, trachea midline.  Cardiovascular: Pulses palpable, no extremity edema. Abdomen: Does not appear distended. Left Shoulder: Inspection reveals no abnormalities, atrophy or asymmetry. Palpation is normal with no tenderness over AC joint or bicipital groove. ROM is full in all planes. Rotator cuff strength normal throughout. No signs of impingement with negative Neer and Hawkin's tests, empty can sign. Speeds and Yergason's tests normal. No labral pathology noted with negative Obrien's, negative clunk and good stability. Normal scapular  function observed. No painful arc and no drop arm sign. No apprehension sign Positive Popeye's sign with bruising in the distal upper arm. He has excellent strength elbow flexion, and is slightly weak to forearm supination.  Impression and Recommendations:   This case required medical decision making of moderate complexity.

## 2012-08-09 NOTE — Assessment & Plan Note (Signed)
80% resolved s/p injection. Continue rehabilitation exercises. Come back as needed.

## 2012-08-09 NOTE — Assessment & Plan Note (Signed)
With Popeye deformity Still has excellent strength to flexion, related to brachialis strength. This does not need surgical intervention, and will likely remain asymptomatic. Of note, he did rupture the right side 5 years ago.

## 2012-08-23 ENCOUNTER — Emergency Department (INDEPENDENT_AMBULATORY_CARE_PROVIDER_SITE_OTHER)
Admission: EM | Admit: 2012-08-23 | Discharge: 2012-08-23 | Disposition: A | Discharge status: Home / Self Care | Payer: BC Managed Care – PPO | Source: Home / Self Care | Attending: Family Medicine | Admitting: Family Medicine

## 2012-08-23 ENCOUNTER — Encounter: Payer: Self-pay | Admitting: *Deleted

## 2012-08-23 ENCOUNTER — Emergency Department (INDEPENDENT_AMBULATORY_CARE_PROVIDER_SITE_OTHER): Payer: Medicare Other

## 2012-08-23 DIAGNOSIS — M79609 Pain in unspecified limb: Secondary | ICD-10-CM

## 2012-08-23 DIAGNOSIS — S6990XA Unspecified injury of unspecified wrist, hand and finger(s), initial encounter: Secondary | ICD-10-CM | POA: Diagnosis not present

## 2012-08-23 DIAGNOSIS — M7989 Other specified soft tissue disorders: Secondary | ICD-10-CM

## 2012-08-23 DIAGNOSIS — M11839 Other specified crystal arthropathies, unspecified wrist: Secondary | ICD-10-CM

## 2012-08-23 DIAGNOSIS — M25539 Pain in unspecified wrist: Secondary | ICD-10-CM

## 2012-08-23 DIAGNOSIS — S63509A Unspecified sprain of unspecified wrist, initial encounter: Secondary | ICD-10-CM | POA: Diagnosis not present

## 2012-08-23 DIAGNOSIS — M1990 Unspecified osteoarthritis, unspecified site: Secondary | ICD-10-CM

## 2012-08-23 DIAGNOSIS — S59909A Unspecified injury of unspecified elbow, initial encounter: Secondary | ICD-10-CM | POA: Diagnosis not present

## 2012-08-23 DIAGNOSIS — W19XXXA Unspecified fall, initial encounter: Secondary | ICD-10-CM

## 2012-08-23 NOTE — ED Notes (Signed)
Patient reports falling yesterday and landing on left hand. Swelling and pain present, limited ROM on ulnar side. Used ice. Possible previous injury.

## 2012-08-23 NOTE — ED Provider Notes (Signed)
History     CSN: [id number]  Arrival date & time 08/23/12  1127   First MD Initiated Contact with Patient 08/23/12 1130      Chief Complaint  Patient presents with  . Hand Injury    left    HPI L hand and wrist pain x 2 days. Pt fell and landed on hand yesterday  Has significant pain and swelling since this point.  No numbness or paresthesias.  Decreased ROM 2/2 pain.   Past Medical History  Diagnosis Date  . Hypertension   . Hyperlipidemia     Past Surgical History  Procedure Date  . Rt knee     Family History  Problem Relation Age of Onset  . Dementia Mother   . Stroke Father     History  Substance Use Topics  . Smoking status: Never Smoker   . Smokeless tobacco: Not on file  . Alcohol Use: Yes      Review of Systems  All other systems reviewed and are negative.    Allergies  Aspirin  Home Medications   Current Outpatient Rx  Name  Route  Sig  Dispense  Refill  . ALLOPURINOL 300 MG PO TABS   Oral   Take 300 mg by mouth daily.         . ATENOLOL 50 MG PO TABS   Oral   Take 50 mg by mouth daily.         Marland Kitchen VITAMIN D 1000 UNITS PO TABS   Oral   Take 1,000 Units by mouth daily.         Marland Kitchen DIAZEPAM 5 MG PO TABS   Oral   Take 5 mg by mouth every 8 (eight) hours as needed.         Marland Kitchen EZETIMIBE 10 MG PO TABS   Oral   Take 10 mg by mouth daily.         . OMEGA-3 FATTY ACIDS 1000 MG PO CAPS   Oral   Take 2 g by mouth 2 (two) times daily.         Marland Kitchen FLAX SEED OIL 1000 MG PO CAPS   Oral   Take 1,000 mg by mouth daily.         Marland Kitchen LOSARTAN POTASSIUM 50 MG PO TABS   Oral   Take 50 mg by mouth daily.         Marland Kitchen MAGNESIUM GLUCONATE 500 MG PO TABS   Oral   Take 500 mg by mouth 2 (two) times daily.         Marland Kitchen PROPRANOLOL HCL ER 120 MG PO CP24   Oral   Take 120 mg by mouth daily.         Marland Kitchen ROSUVASTATIN CALCIUM 10 MG PO TABS   Oral   Take 20 mg by mouth daily.          . TRAMADOL HCL 50 MG PO TABS   Oral   Take 1  tablet (50 mg total) by mouth every 6 (six) hours as needed for pain.   50 tablet   2   . VITAMIN B-12 1000 MCG PO TABS   Oral   Take 1,000 mcg by mouth daily.         Marland Kitchen ZINC GLUCONATE 50 MG PO TABS   Oral   Take 50 mg by mouth daily.           BP 150/71  Pulse 63  Resp 12  Ht  6' (1.829 m)  Wt 220 lb (99.791 kg)  BMI 29.84 kg/m2  SpO2 96%  Physical Exam  Constitutional: He appears well-developed and well-nourished.  HENT:  Head: Normocephalic and atraumatic.  Eyes: Conjunctivae normal are normal. Pupils are equal, round, and reactive to light.  Neck: Normal range of motion. Neck supple.  Cardiovascular: Normal rate, regular rhythm and normal heart sounds.   Pulmonary/Chest: Effort normal.  Abdominal: Soft.  Musculoskeletal:       Hands:      Generalized swelling along dorsal aspect of L wrist.  Decreased ROM 2/2 pain  + ttp over distribution of ulnar styloid.    Neurological: He is alert.  Skin: Skin is warm.    ED Course  Procedures (including critical care time)  Labs Reviewed - No data to display Dg Wrist Complete Left  08/23/2012  *RADIOLOGY REPORT*  Clinical Data: Hand injury post fall  LEFT WRIST - COMPLETE 3+ VIEW  Comparison: None.  Findings: Four views of the left wrist submitted.  No acute fracture or subluxation.  No radiopaque foreign body.  IMPRESSION: No acute fracture or subluxation.   Original Report Authenticated By: Natasha Mead, M.D.    Dg Hand Complete Left  08/23/2012  *RADIOLOGY REPORT*  Clinical Data: Fall yesterday with swelling and pain.  LEFT HAND - COMPLETE 3+ VIEW  Comparison: None.  Findings: Dorsal soft tissue swelling at the level of the distal metacarpals and metacarpal phalangeal joints. No acute fracture or dislocation.  Degenerative change at the radiocarpal joints and distal radial ulnar joint.  There is a suggestion of calcification in the triangular fibrocartilage.  IMPRESSION: Dorsal soft tissue swelling, without acute osseous  abnormality.  Osteoarthritis.  Suspect  calcification in the triangular fibrocartilage indicative of calcium pyrophosphate deposition disease.   Original Report Authenticated By: Jeronimo Greaves, M.D.      1. 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 OA (osteoarthritis)       MDM  xrays negative for fracture.  Wrist brace placed.  Discussed RICE and tylenol (avoid NSAIDs in setting of hx/o ASA related GI bleeding). Noted CPPD disease in [facility]. Suspect this is an incidental finding.  Pt with prior hx/o pseudogout flares in the past. Pain is not similar.  Will hold on treatment.  Follow up with PCP if pain persists.     The patient and/or caregiver has been counseled thoroughly with regard to treatment plan and/or medications prescribed including dosage, schedule, interactions, rationale for use, and possible side effects and they verbalize understanding. Diagnoses and expected course of recovery discussed and will return if not improved as expected or if the condition worsens. Patient and/or caregiver verbalized understanding.               Doree Albee, MD 08/23/12 1258

## 2012-09-04 DIAGNOSIS — L639 Alopecia areata, unspecified: Secondary | ICD-10-CM | POA: Diagnosis not present

## 2012-09-26 DIAGNOSIS — H2510 Age-related nuclear cataract, unspecified eye: Secondary | ICD-10-CM | POA: Diagnosis not present

## 2012-10-02 DIAGNOSIS — I10 Essential (primary) hypertension: Secondary | ICD-10-CM | POA: Diagnosis not present

## 2012-10-02 DIAGNOSIS — M79609 Pain in unspecified limb: Secondary | ICD-10-CM | POA: Diagnosis not present

## 2012-10-02 DIAGNOSIS — R7309 Other abnormal glucose: Secondary | ICD-10-CM | POA: Diagnosis not present

## 2012-10-02 DIAGNOSIS — E782 Mixed hyperlipidemia: Secondary | ICD-10-CM | POA: Diagnosis not present

## 2012-10-02 DIAGNOSIS — E559 Vitamin D deficiency, unspecified: Secondary | ICD-10-CM | POA: Diagnosis not present

## 2012-10-02 DIAGNOSIS — Z79899 Other long term (current) drug therapy: Secondary | ICD-10-CM | POA: Diagnosis not present

## 2012-10-15 DIAGNOSIS — R809 Proteinuria, unspecified: Secondary | ICD-10-CM | POA: Diagnosis not present

## 2012-10-16 DIAGNOSIS — L639 Alopecia areata, unspecified: Secondary | ICD-10-CM | POA: Diagnosis not present

## 2012-10-16 DIAGNOSIS — R809 Proteinuria, unspecified: Secondary | ICD-10-CM | POA: Diagnosis not present

## 2012-10-23 DIAGNOSIS — M1990 Unspecified osteoarthritis, unspecified site: Secondary | ICD-10-CM | POA: Diagnosis not present

## 2012-10-23 DIAGNOSIS — R809 Proteinuria, unspecified: Secondary | ICD-10-CM | POA: Diagnosis not present

## 2012-10-23 DIAGNOSIS — I10 Essential (primary) hypertension: Secondary | ICD-10-CM | POA: Diagnosis not present

## 2012-11-08 DIAGNOSIS — Z1211 Encounter for screening for malignant neoplasm of colon: Secondary | ICD-10-CM | POA: Diagnosis not present

## 2012-11-21 DIAGNOSIS — M48061 Spinal stenosis, lumbar region without neurogenic claudication: Secondary | ICD-10-CM | POA: Diagnosis not present

## 2012-11-22 ENCOUNTER — Other Ambulatory Visit: Payer: Self-pay | Admitting: Neurological Surgery

## 2012-11-22 DIAGNOSIS — M48061 Spinal stenosis, lumbar region without neurogenic claudication: Secondary | ICD-10-CM

## 2012-11-28 ENCOUNTER — Ambulatory Visit
Admission: RE | Admit: 2012-11-28 | Discharge: 2012-11-28 | Disposition: A | Discharge status: Home / Self Care | Payer: Medicare Other | Source: Ambulatory Visit | Attending: Neurological Surgery | Admitting: Neurological Surgery

## 2012-11-28 DIAGNOSIS — M47817 Spondylosis without myelopathy or radiculopathy, lumbosacral region: Secondary | ICD-10-CM | POA: Diagnosis not present

## 2012-11-28 DIAGNOSIS — M48061 Spinal stenosis, lumbar region without neurogenic claudication: Secondary | ICD-10-CM

## 2012-11-28 DIAGNOSIS — M4310 Spondylolisthesis, site unspecified: Secondary | ICD-10-CM | POA: Diagnosis not present

## 2012-12-05 DIAGNOSIS — M48061 Spinal stenosis, lumbar region without neurogenic claudication: Secondary | ICD-10-CM | POA: Diagnosis not present

## 2013-01-02 DIAGNOSIS — M109 Gout, unspecified: Secondary | ICD-10-CM | POA: Diagnosis not present

## 2013-01-02 DIAGNOSIS — I10 Essential (primary) hypertension: Secondary | ICD-10-CM | POA: Diagnosis not present

## 2013-01-02 DIAGNOSIS — E782 Mixed hyperlipidemia: Secondary | ICD-10-CM | POA: Diagnosis not present

## 2013-01-02 DIAGNOSIS — R7309 Other abnormal glucose: Secondary | ICD-10-CM | POA: Diagnosis not present

## 2013-01-02 DIAGNOSIS — Z79899 Other long term (current) drug therapy: Secondary | ICD-10-CM | POA: Diagnosis not present

## 2013-02-26 DIAGNOSIS — R809 Proteinuria, unspecified: Secondary | ICD-10-CM | POA: Diagnosis not present

## 2013-03-14 DIAGNOSIS — R809 Proteinuria, unspecified: Secondary | ICD-10-CM | POA: Diagnosis not present

## 2013-03-14 DIAGNOSIS — E785 Hyperlipidemia, unspecified: Secondary | ICD-10-CM | POA: Diagnosis not present

## 2013-03-14 DIAGNOSIS — I10 Essential (primary) hypertension: Secondary | ICD-10-CM | POA: Diagnosis not present

## 2013-05-06 DIAGNOSIS — M79609 Pain in unspecified limb: Secondary | ICD-10-CM | POA: Diagnosis not present

## 2013-05-06 DIAGNOSIS — R7309 Other abnormal glucose: Secondary | ICD-10-CM | POA: Diagnosis not present

## 2013-05-06 DIAGNOSIS — I10 Essential (primary) hypertension: Secondary | ICD-10-CM | POA: Diagnosis not present

## 2013-05-06 DIAGNOSIS — Z79899 Other long term (current) drug therapy: Secondary | ICD-10-CM | POA: Diagnosis not present

## 2013-05-06 DIAGNOSIS — E559 Vitamin D deficiency, unspecified: Secondary | ICD-10-CM | POA: Diagnosis not present

## 2013-05-06 DIAGNOSIS — E782 Mixed hyperlipidemia: Secondary | ICD-10-CM | POA: Diagnosis not present

## 2013-05-14 DIAGNOSIS — L639 Alopecia areata, unspecified: Secondary | ICD-10-CM | POA: Diagnosis not present

## 2013-05-23 DIAGNOSIS — Z23 Encounter for immunization: Secondary | ICD-10-CM | POA: Diagnosis not present

## 2013-06-25 DIAGNOSIS — L639 Alopecia areata, unspecified: Secondary | ICD-10-CM | POA: Diagnosis not present

## 2013-06-26 ENCOUNTER — Encounter: Payer: Self-pay | Admitting: Internal Medicine

## 2013-08-14 DIAGNOSIS — L639 Alopecia areata, unspecified: Secondary | ICD-10-CM | POA: Diagnosis not present

## 2013-08-26 DIAGNOSIS — R7309 Other abnormal glucose: Secondary | ICD-10-CM | POA: Insufficient documentation

## 2013-08-26 DIAGNOSIS — E559 Vitamin D deficiency, unspecified: Secondary | ICD-10-CM | POA: Insufficient documentation

## 2013-08-26 DIAGNOSIS — M109 Gout, unspecified: Secondary | ICD-10-CM | POA: Insufficient documentation

## 2013-08-27 ENCOUNTER — Encounter: Payer: Self-pay | Admitting: Internal Medicine

## 2013-08-27 ENCOUNTER — Other Ambulatory Visit: Payer: Self-pay | Admitting: Internal Medicine

## 2013-08-27 ENCOUNTER — Ambulatory Visit (INDEPENDENT_AMBULATORY_CARE_PROVIDER_SITE_OTHER): Payer: Medicare Other | Admitting: Internal Medicine

## 2013-08-27 VITALS — BP 132/76 | HR 64 | Temp 98.8°F | Resp 18 | Wt 228.0 lb

## 2013-08-27 DIAGNOSIS — G250 Essential tremor: Secondary | ICD-10-CM | POA: Insufficient documentation

## 2013-08-27 DIAGNOSIS — E559 Vitamin D deficiency, unspecified: Secondary | ICD-10-CM

## 2013-08-27 DIAGNOSIS — R809 Proteinuria, unspecified: Secondary | ICD-10-CM | POA: Insufficient documentation

## 2013-08-27 DIAGNOSIS — G259 Extrapyramidal and movement disorder, unspecified: Secondary | ICD-10-CM | POA: Insufficient documentation

## 2013-08-27 DIAGNOSIS — I10 Essential (primary) hypertension: Secondary | ICD-10-CM | POA: Diagnosis not present

## 2013-08-27 DIAGNOSIS — E782 Mixed hyperlipidemia: Secondary | ICD-10-CM | POA: Diagnosis not present

## 2013-08-27 DIAGNOSIS — Z79899 Other long term (current) drug therapy: Secondary | ICD-10-CM | POA: Diagnosis not present

## 2013-08-27 DIAGNOSIS — R7309 Other abnormal glucose: Secondary | ICD-10-CM

## 2013-08-27 LAB — HEPATIC FUNCTION PANEL
ALT: 21 U/L (ref 0–53)
AST: 29 U/L (ref 0–37)
Albumin: 4 g/dL (ref 3.5–5.2)
Alkaline Phosphatase: 45 U/L (ref 39–117)
Bilirubin, Direct: 0.2 mg/dL (ref 0.0–0.3)
Indirect Bilirubin: 0.7 mg/dL (ref 0.0–0.9)
Total Bilirubin: 0.9 mg/dL (ref 0.3–1.2)
Total Protein: 6.8 g/dL (ref 6.0–8.3)

## 2013-08-27 LAB — CBC WITH DIFFERENTIAL/PLATELET
Basophils Absolute: 0 10*3/uL (ref 0.0–0.1)
Basophils Relative: 1 % (ref 0–1)
Eosinophils Absolute: 0.1 10*3/uL (ref 0.0–0.7)
Eosinophils Relative: 3 % (ref 0–5)
HCT: 48.1 % (ref 39.0–52.0)
Hemoglobin: 16.6 g/dL (ref 13.0–17.0)
Lymphocytes Relative: 34 % (ref 12–46)
Lymphs Abs: 1.5 10*3/uL (ref 0.7–4.0)
MCH: 34.2 pg — ABNORMAL HIGH (ref 26.0–34.0)
MCHC: 34.5 g/dL (ref 30.0–36.0)
MCV: 99.2 fL (ref 78.0–100.0)
Monocytes Absolute: 0.5 10*3/uL (ref 0.1–1.0)
Monocytes Relative: 12 % (ref 3–12)
Neutro Abs: 2.3 10*3/uL (ref 1.7–7.7)
Neutrophils Relative %: 50 % (ref 43–77)
Platelets: 157 10*3/uL (ref 150–400)
RBC: 4.85 MIL/uL (ref 4.22–5.81)
RDW: 14.4 % (ref 11.5–15.5)
WBC: 4.4 10*3/uL (ref 4.0–10.5)

## 2013-08-27 LAB — BASIC METABOLIC PANEL WITH GFR
BUN: 18 mg/dL (ref 6–23)
CO2: 28 mEq/L (ref 19–32)
Calcium: 10.3 mg/dL (ref 8.4–10.5)
Chloride: 103 mEq/L (ref 96–112)
Creat: 0.87 mg/dL (ref 0.50–1.35)
GFR, Est African American: >89 mL/min
GFR, Est Non African American: 88 mL/min
Glucose, Bld: 80 mg/dL (ref 70–99)
Potassium: 4.4 mEq/L (ref 3.5–5.3)
Sodium: 139 mEq/L (ref 135–145)

## 2013-08-27 LAB — LIPID PANEL
Cholesterol: 205 mg/dL — ABNORMAL HIGH (ref 0–200)
HDL: 84 mg/dL (ref >39)
LDL Cholesterol: 94 mg/dL (ref 0–99)
Total CHOL/HDL Ratio: 2.4 Ratio
Triglycerides: 133 mg/dL (ref <150)
VLDL: 27 mg/dL (ref 0–40)

## 2013-08-27 LAB — MAGNESIUM: Magnesium: 1.4 mg/dL — ABNORMAL LOW (ref 1.5–2.5)

## 2013-08-27 LAB — HEMOGLOBIN A1C
Hgb A1c MFr Bld: 5.7 % — ABNORMAL HIGH (ref <5.7)
Mean Plasma Glucose: 117 mg/dL — ABNORMAL HIGH (ref <117)

## 2013-08-27 LAB — TSH: TSH: 1.716 u[IU]/mL (ref 0.350–4.500)

## 2013-08-27 MED ORDER — LOSARTAN POTASSIUM 100 MG PO TABS: 50.0000 mg | ORAL_TABLET | Freq: Two times a day (BID) | ORAL | Status: DC

## 2013-08-27 NOTE — Progress Notes (Signed)
Patient ID: Leroy Griffin, male   DOB: 26-Nov-1945, 68 y.o.   MRN: [id number]   This very nice 68 y.o. MWM presents for 3 month follow up with Hypertension, Hyperlipidemia, Pre-Diabetes and Vitamin D Deficiency.    HTN predates since 69. BP has been controlled at home. Today's BP: 132/76 mmHg.  He also has been followed by Dr Caryn Section for Probable Benign Proteinuria. Patient denies any cardiac type chest pain, palpitations, dyspnea/orthopnea/PND, dizziness, claudication, or dependent edema.   Hyperlipidemia is controlled with diet & meds. Last Cholesterol was 202, Triglycerides were 133, HDL 64 and LDL 111. Patient denies myalgias or other med SE's.    Also, the patient has history of PreDiabetes with last A1c of 5.6% in Sept 2014. Patient denies any symptoms of reactive hypoglycemia, diabetic polys, paresthesias or visual blurring.   Further, Patient has history of Vitamin D Deficiency of 11 in 2009 with last vitamin D of   75 in Sept 2014. Patient supplements vitamin D without any suspected side-effects.    Medication List       acetaminophen 500 MG tablet  Commonly known as:  TYLENOL  Take 500 mg by mouth every 6 (six) hours as needed.     acetaminophen-codeine 300-30 MG per tablet  Commonly known as:  TYLENOL #3     allopurinol 300 MG tablet  Commonly known as:  ZYLOPRIM  Take 300 mg by mouth daily. Takes 1/2 tab daily     cholecalciferol 1000 UNITS tablet  Commonly known as:  VITAMIN D  Take 1,000 Units by mouth daily.     diazepam 5 MG tablet  Commonly known as:  VALIUM  Take 5 mg by mouth every 8 (eight) hours as needed.     fish oil-omega-3 fatty acids 1000 MG capsule  Take 2 g by mouth 2 (two) times daily.     Flax Seed Oil 1000 MG Caps  Take 1,000 mg by mouth daily.     losartan 100 MG tablet  Commonly known as:  COZAAR  Take 0.5 tablets (50 mg total) by mouth 2 (two) times daily.     magnesium gluconate 500 MG tablet  Commonly known as:  MAGONATE  Take 500 mg by  mouth 2 (two) times daily.     propranolol ER 120 MG 24 hr capsule  Commonly known as:  INDERAL LA  Take 120 mg by mouth daily.     rosuvastatin 20 MG tablet  Commonly known as:  CRESTOR  Take 20 mg by mouth daily.     vitamin B-12 1000 MCG tablet  Commonly known as:  CYANOCOBALAMIN  Take 1,000 mcg by mouth daily.     zinc gluconate 50 MG tablet  Take 50 mg by mouth daily.         Allergies  Allergen Reactions  . Aspirin     Internal bleeding  . Welchol [Colesevelam Hcl] Diarrhea  . Vasotec [Enalaprilat] Rash    PMHx:   Past Medical History  Diagnosis Date  . Hypertension   . Hyperlipidemia   . Prediabetes   . Gout   . Vitamin D deficiency     FHx:    Reviewed / unchanged  SHx:    Reviewed / unchanged  Systems Review: Constitutional: Denies fever, chills, wt changes, headaches, insomnia, fatigue, night sweats, change in appetite. Eyes: Denies redness, blurred vision, diplopia, discharge, itchy, watery eyes.  ENT: Denies discharge, congestion, post nasal drip, epistaxis, sore throat, earache, hearing loss, dental pain, tinnitus, vertigo,  sinus pain, snoring.  CV: Denies chest pain, palpitations, irregular heartbeat, syncope, dyspnea, diaphoresis, orthopnea, PND, claudication, edema. Respiratory: denies cough, dyspnea, DOE, pleurisy, hoarseness, laryngitis, wheezing.  Gastrointestinal: Denies dysphagia, odynophagia, heartburn, reflux, water brash, abdominal pain or cramps, nausea, vomiting, bloating, diarrhea, constipation, hematemesis, melena, hematochezia,  or hemorrhoids. Genitourinary: Denies dysuria, frequency, urgency, nocturia, hesitancy, discharge, hematuria, flank pain. Musculoskeletal: Denies arthralgias, myalgias, stiffness, jt. swelling, pain, limp, strain/sprain.  Skin: Denies pruritus, rash, hives, warts, acne, eczema, change in skin lesion(s). Neuro: No weakness, tremor, incoordination, spasms, paresthesia, or pain. Psychiatric: Denies confusion,  memory loss, or sensory loss. Endo: Denies change in weight, skin, hair change.  Heme/Lymph: No excessive bleeding, bruising, orenlarged lymph nodes.  BP: 132/76  Pulse: 64  Temp: 98.8 F (37.1 C)  Resp: 18    Estimated body mass index is 30.92 kg/(m^2) as calculated from the following:   Height as of 08/23/12: 6' (1.829 m).   Weight as of this encounter: 228 lb (103.42 kg).  On Exam: Appears well nourished - in no distress. Eyes: PERRLA, EOMs, conjunctiva no swelling or erythema. Sinuses: No frontal/maxillary tenderness ENT/Mouth: EAC's clear, TM's nl w/o erythema, bulging. Nares clear w/o erythema, swelling, exudates. Oropharynx clear without erythema or exudates. Oral hygiene is good. Tongue normal, non obstructing. Hearing intact.  Neck: Supple. Thyroid nl. Car 2+/2+ without bruits, nodes or JVD. Chest: Respirations nl with BS clear & equal w/o rales, rhonchi, wheezing or stridor.  Cor: Heart sounds normal w/ regular rate and rhythm without sig. murmurs, gallops, clicks, or rubs. Peripheral pulses normal and equal  without edema.  Abdomen: Soft & bowel sounds normal. Non-tender w/o guarding, rebound, hernias, masses, or organomegaly.  Lymphatics: Unremarkable.  Musculoskeletal: Full ROM all peripheral extremities, joint stability, 5/5 strength, and normal gait.  Skin: Warm, dry without exposed rashes, lesions, ecchymosis apparent.  Neuro: Cranial nerves intact, reflexes equal bilaterally. Sensory-motor testing grossly intact. Tendon reflexes grossly intact.  Pysch: Alert & oriented x 3. Insight and judgement nl & appropriate. No ideations.  Assessment and Plan:  1. Hypertension - Continue monitor blood pressure at home. Continue diet/meds same.  2. Hyperlipidemia - Continue diet/meds, exercise,& lifestyle modifications. Continue monitor periodic cholesterol/liver & renal functions   3. Pre-diabetes - Continue diet, exercise, lifestyle modifications. Monitor appropriate  labs.  4. Vitamin D Deficiency - Continue supplementation.  Recommended regular exercise, BP monitoring, weight control, and discussed med and SE's. Recommended labs to assess and monitor clinical status. Further disposition pending results of labs.

## 2013-08-27 NOTE — Patient Instructions (Signed)

## 2013-08-28 LAB — INSULIN, FASTING: Insulin fasting, serum: 10 u[IU]/mL (ref 3–28)

## 2013-08-28 LAB — VITAMIN D 25 HYDROXY (VIT D DEFICIENCY, FRACTURES): Vit D, 25-Hydroxy: 72 ng/mL (ref 30–89)

## 2013-09-25 DIAGNOSIS — L639 Alopecia areata, unspecified: Secondary | ICD-10-CM | POA: Diagnosis not present

## 2013-09-26 DIAGNOSIS — H259 Unspecified age-related cataract: Secondary | ICD-10-CM | POA: Diagnosis not present

## 2013-10-04 ENCOUNTER — Other Ambulatory Visit: Payer: Self-pay | Admitting: Physician Assistant

## 2013-10-04 MED ORDER — DIAZEPAM 5 MG PO TABS: 5.0000 mg | ORAL_TABLET | Freq: Three times a day (TID) | ORAL | Status: DC | PRN

## 2013-10-06 DIAGNOSIS — Z79899 Other long term (current) drug therapy: Secondary | ICD-10-CM | POA: Insufficient documentation

## 2013-10-06 NOTE — Patient Instructions (Addendum)

## 2013-10-06 NOTE — Progress Notes (Signed)
Patient ID: Leroy Griffin, male   DOB: [birth year], 68 y.o.   MRN: [id number]   Annual  Comprehensive Examination  This very nice 68 y.o.  MWM presents for complete physical.  Patient has been followed for HTN, Prediabetes, Hyperlipidemia, Benign Proteinuria, Essential Tremor and Vitamin D Deficiency.   HTN predates since 72. Patient's BP has been controlled at home.Today's BP: 130/86 mmHg. Patient denies any cardiac symptoms as chest pain, palpitations, shortness of breath, dizziness or ankle swelling.   Patient's hyperlipidemia is controlled with diet and medications. Patient denies myalgias or other medication SE's. Last Lipid Profile at goal in Jan 2014 is as follows: Lab Results  Component Value Date   CHOL 205* 08/27/2013   HDL 84 08/27/2013   LDLCALC 94 08/27/2013   TRIG 133 08/27/2013   CHOLHDL 2.4 08/27/2013        Patient has Obesity (BMI 31.20) and is screened for prediabetes and insulin resistance with A1c was 5.7% in Jan 2014 . Patient denies reactive hypoglycemic symptoms, visual blurring, diabetic polys, or paresthesias.    Patient has Benign Proteinuria and has been followed by Dr Caryn Section and now will be followed by Dr Julien Girt.   Finally, patient has history of Vitamin D Deficiency of 11 in 2009 with last vitamin D 75 in Sept 2014.   Medication List       acetaminophen-codeine 300-30 MG per tablet  Commonly known as:  TYLENOL #3     allopurinol 300 MG tablet  Commonly known as:  ZYLOPRIM  Take 300 mg by mouth daily. Takes 1/2 tab daily     diazepam 5 MG tablet  Commonly known as:  VALIUM  Take 1 tablet (5 mg total) by mouth every 8 (eight) hours as needed.     Fish Oil 1200 MG Cpdr  Take by mouth 2 (two) times daily.     Flax Seed Oil 1000 MG Caps  Take 1,000 mg by mouth daily.     losartan 100 MG tablet  Commonly known as:  COZAAR  Take 0.5 tablets (50 mg total) by mouth 2 (two) times daily.     magnesium gluconate 500 MG tablet  Commonly known as:   MAGONATE  Take 500 mg by mouth 2 (two) times daily.     propranolol ER 120 MG 24 hr capsule  Commonly known as:  INDERAL LA  Take 120 mg by mouth daily.     rosuvastatin 20 MG tablet  Commonly known as:  CRESTOR  Take 20 mg by mouth daily.     vitamin B-12 1000 MCG tablet  Commonly known as:  CYANOCOBALAMIN  Take 1,000 mcg by mouth daily.     VITAMIN D PO  Take 2,000 Units by mouth 2 (two) times daily.     zinc gluconate 50 MG tablet  Take 50 mg by mouth daily.       Allergies  Allergen Reactions  . Aspirin     Internal bleeding  . Welchol [Colesevelam Hcl] Diarrhea  . Vasotec [Enalaprilat] Rash    Past Medical History  Diagnosis Date  . Hypertension   . Hyperlipidemia   . Prediabetes   . Gout   . Vitamin D deficiency   . Essential tremor   . Proteinuria    Past Surgical History  Procedure Laterality Date  . Rt knee     Family History  Problem Relation Age of Onset  . Dementia Mother   . Stroke Father    History  Social History  . Marital Status: Married    Spouse Name: N/A    Number of Children: N/A  . Years of Education: N/A   Occupational History  . 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   Social History Main Topics  . Smoking status: Never Smoker   . Smokeless tobacco: Not on file  . Alcohol Use: 7.0 oz/week    14 drink(s) per week  . Drug Use: No  . Sexual Activity: Active    ROS Constitutional: Denies fever, chills, weight loss/gain, headaches, insomnia, fatigue, night sweats, and change in appetite. Eyes: Denies redness, blurred vision, diplopia, discharge, itchy, watery eyes.  ENT: Denies discharge, congestion, post nasal drip, epistaxis, sore throat, earache, hearing loss, dental pain, Tinnitus, Vertigo, Sinus pain, snoring.  Cardio: Denies chest pain, palpitations, irregular heartbeat, syncope, dyspnea, diaphoresis, orthopnea, PND, claudication, edema Respiratory: denies cough, dyspnea, DOE, pleurisy, hoarseness,  laryngitis, wheezing.  Gastrointestinal: Denies dysphagia, heartburn, reflux, water brash, pain, cramps, nausea, vomiting, bloating, diarrhea, constipation, hematemesis, melena, hematochezia, jaundice, hemorrhoids Genitourinary: Denies dysuria, frequency, urgency, nocturia, hesitancy, discharge, hematuria, flank pain Musculoskeletal: Denies arthralgia, myalgia, stiffness, Jt. Swelling, pain, limp, and strain/sprain. Skin: Denies puritis, rash, hives, warts, acne, eczema, changing in skin lesion Neuro: No weakness, tremor, incoordination, spasms, paresthesia, pain Psychiatric: Denies confusion, memory loss, sensory loss Endocrine: Denies change in weight, skin, hair change, nocturia, and paresthesia, diabetic polys, visual blurring, hyper / hypo glycemic episodes.  Heme/Lymph: No excessive bleeding, bruising, or elarged lymph nodes.  Filed Vitals:   10/07/13 1602  BP: 130/86  Pulse: 64  Temp: 98.6 F (37 C)  Resp: 16    Estimated body mass index is 31.28 kg/(m^2) as calculated from the following:   Height as of this encounter: 6' 0.5" (1.842 m).   Weight as of this encounter: 234 lb (106.142 kg).  Physical Exam General Appearance: Well nourished, in no apparent distress. Eyes: PERRLA, EOMs, conjunctiva no swelling or erythema, normal fundi and vessels. Sinuses: No frontal/maxillary tenderness ENT/Mouth: EACs patent / TMs  nl. Nares clear without erythema, swelling, mucoid exudates. Oral hygiene is good. No erythema, swelling, or exudate. Tongue normal, non-obstructing. Tonsils not swollen or erythematous. Hearing normal.  Neck: Supple, thyroid normal. No bruits, nodes or JVD. Respiratory: Respiratory effort normal.  BS equal and clear bilateral without rales, rhonci, wheezing or stridor. Cardio: Heart sounds are normal with regular rate and rhythm and no murmurs, rubs or gallops. Peripheral pulses are normal and equal bilaterally without edema. No aortic or femoral bruits. Chest:  symmetric with normal excursions and percussion.  Abdomen: Flat, soft, with bowl sounds. Nontender, no guarding, rebound, hernias, masses, or organomegaly.  Lymphatics: Non tender without lymphadenopathy.  Genitourinary: No hernias.Testes nl. DRE - prostate nl for age - smooth & firm w/o nodules. Musculoskeletal: Full ROM all peripheral extremities, joint stability, 5/5 strength, and normal gait. Skin: Warm and dry without rashes, lesions, cyanosis, clubbing or  ecchymosis.  Neuro: Cranial nerves intact, reflexes equal bilaterally. Normal muscle tone, no cerebellar symptoms. Sensation intact. Very slight high frequency low amplitude tremor of the hands - assym to the Rt. Pysch: Awake and oriented X 3, normal affect, insight and judgment appropriate.   Assessment and Plan  1. Comprehensive Annual Exam  2. Hypertension  3. Hyperlipidemia 4. Pre Diabetes 5. Vitamin D Deficiency 6. Benign Proteinuria &. Essential Tremor  Continue prudent diet as discussed, weight control, BP monitoring, regular exercise, and medications as discussed.  Discussed med effects and SE's. Routine screening  labs and tests as requested with regular follow-up as recommended.  EKG today shows Mobitz II AVB and as patient has been Assx , it's speculated that it may well be due to his Betablocker therapy which he is advised to discontinue. He is to return in 7948 Hr for repeat EKG and is advised if  EKG still abnormal that he will be referred to Cardiology.

## 2013-10-07 ENCOUNTER — Encounter: Payer: Self-pay | Admitting: Internal Medicine

## 2013-10-07 ENCOUNTER — Ambulatory Visit (INDEPENDENT_AMBULATORY_CARE_PROVIDER_SITE_OTHER): Payer: Medicare Other | Admitting: Internal Medicine

## 2013-10-07 VITALS — BP 130/86 | HR 64 | Temp 98.6°F | Resp 16 | Ht 72.5 in | Wt 234.0 lb

## 2013-10-07 DIAGNOSIS — I10 Essential (primary) hypertension: Secondary | ICD-10-CM | POA: Diagnosis not present

## 2013-10-07 DIAGNOSIS — Z125 Encounter for screening for malignant neoplasm of prostate: Secondary | ICD-10-CM

## 2013-10-07 DIAGNOSIS — Z0000 Encounter for general adult medical examination without abnormal findings: Secondary | ICD-10-CM

## 2013-10-07 DIAGNOSIS — R7309 Other abnormal glucose: Secondary | ICD-10-CM

## 2013-10-07 DIAGNOSIS — Z1212 Encounter for screening for malignant neoplasm of rectum: Secondary | ICD-10-CM

## 2013-10-07 DIAGNOSIS — E559 Vitamin D deficiency, unspecified: Secondary | ICD-10-CM

## 2013-10-07 DIAGNOSIS — E785 Hyperlipidemia, unspecified: Secondary | ICD-10-CM

## 2013-10-07 DIAGNOSIS — Z79899 Other long term (current) drug therapy: Secondary | ICD-10-CM | POA: Diagnosis not present

## 2013-10-08 LAB — URINALYSIS, MICROSCOPIC ONLY
Bacteria, UA: NONE SEEN
Casts: NONE SEEN
Crystals: NONE SEEN
Squamous Epithelial / LPF: NONE SEEN

## 2013-10-08 LAB — PSA: PSA: 1.69 ng/mL (ref <=4.00)

## 2013-10-08 LAB — MICROALBUMIN / CREATININE URINE RATIO
Creatinine, Urine: 118 mg/dL
Microalb Creat Ratio: 1167.5 mg/g — ABNORMAL HIGH (ref 0.0–30.0)
Microalb, Ur: 137.77 mg/dL — ABNORMAL HIGH (ref 0.00–1.89)

## 2013-10-09 ENCOUNTER — Ambulatory Visit (INDEPENDENT_AMBULATORY_CARE_PROVIDER_SITE_OTHER): Payer: Medicare Other | Admitting: Internal Medicine

## 2013-10-09 ENCOUNTER — Encounter: Payer: Self-pay | Admitting: Internal Medicine

## 2013-10-09 VITALS — BP 130/84 | HR 68 | Temp 97.9°F | Resp 18 | Wt 230.6 lb

## 2013-10-09 DIAGNOSIS — I499 Cardiac arrhythmia, unspecified: Secondary | ICD-10-CM | POA: Diagnosis not present

## 2013-10-09 DIAGNOSIS — R9431 Abnormal electrocardiogram [ECG] [EKG]: Secondary | ICD-10-CM | POA: Diagnosis not present

## 2013-10-09 NOTE — Progress Notes (Signed)
   Subjective:    Patient ID: Leroy Griffin, male    DOB: 05/03/1946, 68 y.o.   MRN: [id number]  HPI 68 yo MWM seen 3 days ago for annual CPE and screening EKG showed Mobitz II AV block. Patient has been completely asymptomatic and was advised to discontinue his beta  Blocker (Inderal 120 mg) and return for repeat EKG.  Meds, FH, SH and ROS unremarkable from 3 days ago    Review of Systems Neg to above.  BP: 130/84  Pulse: 68  Temp: 97.9 F (36.6 C)  Resp: 18   Objective:   Physical Exam HEENT - Neg. Neck - Supple. No Bruits/JVD. Chest- Clear . Cor- Nl HS w/slightly irregular rhythm and no sig M.No edema. Abd - Benign.  Repeat EKG - no clear cut Mobitz II - but appears possible wandering atrial pacemaker on rhythm strips.  Assessment & Plan:   1. Cardiac Arrhythmia ? SN Dysfunction  Discussed w/ Dr Sanjuana KavaMcAlhaney who will have his office contact patient for cardiac evaluation.

## 2013-10-10 ENCOUNTER — Encounter: Payer: Self-pay | Admitting: Internal Medicine

## 2013-10-10 ENCOUNTER — Ambulatory Visit (INDEPENDENT_AMBULATORY_CARE_PROVIDER_SITE_OTHER): Payer: Medicare Other | Admitting: Internal Medicine

## 2013-10-10 VITALS — BP 158/78 | HR 78 | Ht 72.5 in | Wt 231.0 lb

## 2013-10-10 DIAGNOSIS — I441 Atrioventricular block, second degree: Secondary | ICD-10-CM

## 2013-10-10 DIAGNOSIS — I455 Other specified heart block: Secondary | ICD-10-CM

## 2013-10-10 DIAGNOSIS — I495 Sick sinus syndrome: Secondary | ICD-10-CM | POA: Insufficient documentation

## 2013-10-10 NOTE — Progress Notes (Signed)
ELECTROPHYSIOLOGY CONSULT NOTE  Patient ID: Leroy Griffin, MRN: [id number], DOB/AGE: 70/09/1945 70 y.o. Admit date: (Not on file) Date of Consult: 10/10/2013  Primary Physician: Nadean CorwinMCKEOWN,WILLIAM DAVID, MD Primary Cardiologist: new  Chief Complaint:  Abnormal ECG   HPI Leroy Griffin is a 68 y.o. male  Referred because of an abnormal electrocardiogram concerning for Mobitz heart block.  The patient is a retired Clinical research associatelawyer from [employer] who is very sad and was no limitations of exercise tolerance. He climbs 3 flights of stairs routinely. 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 He denies episodes of lightheadedness or presyncope or syncope.  During a routine evaluation for his physical he was noted to have an abnormal heart rhythm. ECG was obtained that demonstrated multiple pauses. There are also some blocked PACs given rise to the concern about Mobitz heart block.    Past Medical History  Diagnosis Date  . Hypertension   . Hyperlipidemia   . Prediabetes   . Gout   . Vitamin D deficiency   . Essential tremor   . Proteinuria       Surgical History:  Past Surgical History  Procedure Laterality Date  . Rt knee       Home Meds: Prior to Admission medications   Medication Sig Start Date End Date Taking? Authorizing Provider  allopurinol (ZYLOPRIM) 300 MG tablet Take 300 mg by mouth daily. Takes 1/2 tab daily   Yes Historical Provider, MD  Cholecalciferol (VITAMIN D PO) Take 2,000 Units by mouth 2 (two) times daily.   Yes Historical Provider, MD  diazepam (VALIUM) 5 MG tablet Take 1 tablet (5 mg total) by mouth every 8 (eight) hours as needed. 10/04/13  Yes Quentin MullingAmanda Collier, PA-C  Flaxseed, Linseed, (FLAX SEED OIL) 1000 MG CAPS Take 1,000 mg by mouth daily.   Yes Historical Provider, MD  losartan (COZAAR) 100 MG tablet Take 0.5 tablets (50 mg total) by mouth 2 (two) times daily. 08/27/13  Yes Lucky CowboyWilliam McKeown, MD  magnesium gluconate (MAGONATE) 500 MG tablet Take 500 mg by mouth  2 (two) times daily.   Yes Historical Provider, MD  Omega-3 Fatty Acids (FISH OIL) 1200 MG CPDR Take by mouth 2 (two) times daily.   Yes Historical Provider, MD  rosuvastatin (CRESTOR) 20 MG tablet Take 20 mg by mouth daily.   Yes Historical Provider, MD  vitamin B-12 (CYANOCOBALAMIN) 1000 MCG tablet Take 1,000 mcg by mouth daily.   Yes Historical Provider, MD  zinc gluconate 50 MG tablet Take 50 mg by mouth daily.   Yes Historical Provider, MD     Allergies:  Allergies  Allergen Reactions  . Aspirin     Internal bleeding  . Welchol [Colesevelam Hcl] Diarrhea  . Vasotec [Enalaprilat] Rash    History   Social History  . Marital Status: Married    Spouse Name: N/A    Number of Children: N/A  . Years of Education: N/A   Occupational History  . Not on file.   Social History Main Topics  . Smoking status: Never Smoker   . Smokeless tobacco: Not on file  . Alcohol Use: 7.0 oz/week    14 drink(s) per week  . Drug Use: No  . Sexual Activity: Not on file   Other Topics Concern  . Not on file   Social History Narrative  . No narrative on file     Family History  Problem Relation Age of Onset  . Dementia Mother   .  Stroke Father      ROS:  Please see the history of present illness.     All other systems reviewed and negative.    Physical Exam: Blood pressure 158/78, pulse 78, height 6' 0.5" (1.842 m), weight 231 lb (104.781 kg). General: Well developed, well nourished male in no acute distress. Head: Normocephalic, atraumatic, sclera non-icteric, no xanthomas, nares are without discharge. EENT: normal Lymph Nodes:  none Back: without scoliosis/kyphosis, no CVA tendersness Neck: Negative for carotid bruits. JVD not elevated. Lungs: Clear bilaterally to auscultation without wheezes, rales, or rhonchi. Breathing is unlabored. Heart: RRR with S1 S2. No  murmur , rubs, or gallops appreciated. Abdomen: Soft, non-tender, non-distended with normoactive bowel sounds. No  hepatomegaly. No rebound/guarding. No obvious abdominal masses. Msk:  Strength and tone appear normal for age. Extremities: No clubbing or cyanosis. No  edema.  Distal pedal pulses are 2+ and equal bilaterally. Skin: Warm and Dry Neuro: Alert and oriented X 3. CN III-XII intact Grossly normal sensory and motor function . Psych:  Responds to questions appropriately with a normal affect.      Labs: Cardiac Enzymes No results found for this basename: CKTOTAL, CKMB, TROPONINI,  in the last 72 hours CBC Lab Results  Component Value Date   WBC 4.4 08/27/2013   HGB 16.6 08/27/2013   HCT 48.1 08/27/2013   MCV 99.2 08/27/2013   PLT 157 08/27/2013   PROTIME: No results found for this basename: LABPROT, INR,  in the last 72 hours Chemistry No results found for this basename: NA, K, CL, CO2, BUN, CREATININE, CALCIUM, LABALBU, PROT, BILITOT, ALKPHOS, ALT, AST, GLUCOSE,  in the last 168 hours Lipids Lab Results  Component Value Date   CHOL 205* 08/27/2013   HDL 84 08/27/2013   LDLCALC 94 08/27/2013   TRIG 133 08/27/2013   BNP No results found for this basename: probnp   Miscellaneous No results found for this basename: DDIMER    Radiology/Studies:  No results found.  EKG:  Sinus rhythm with PACs ECGs from outside hospital were reviewed these demonstrate sinus rhythm with blocked PACs, blocked PVCs. There is also sinus arrhythmia   Assessment and Plan:   Sinus arrhythmia with blocked PACs and PVCs  the patient has asymptomatic sinus arrhythmia and blocked PACs and PVCs. There is no effort intolerance or symptoms of lightheadedness. I reviewed the physiology with Leroy Griffin. She will let us know if any symptoms when form. Nothing needs to be done currently.   Sherryl Manges

## 2013-10-29 ENCOUNTER — Other Ambulatory Visit: Payer: Self-pay | Admitting: *Deleted

## 2013-10-29 MED ORDER — LOSARTAN POTASSIUM 100 MG PO TABS: 50.0000 mg | ORAL_TABLET | Freq: Two times a day (BID) | ORAL | Status: DC

## 2013-11-11 DIAGNOSIS — R809 Proteinuria, unspecified: Secondary | ICD-10-CM | POA: Diagnosis not present

## 2013-11-11 DIAGNOSIS — I129 Hypertensive chronic kidney disease with stage 1 through stage 4 chronic kidney disease, or unspecified chronic kidney disease: Secondary | ICD-10-CM | POA: Diagnosis not present

## 2013-11-11 DIAGNOSIS — E785 Hyperlipidemia, unspecified: Secondary | ICD-10-CM | POA: Diagnosis not present

## 2013-11-19 DIAGNOSIS — L639 Alopecia areata, unspecified: Secondary | ICD-10-CM | POA: Diagnosis not present

## 2013-11-27 ENCOUNTER — Other Ambulatory Visit: Payer: Self-pay | Admitting: Internal Medicine

## 2013-12-15 ENCOUNTER — Other Ambulatory Visit: Payer: Self-pay | Admitting: Internal Medicine

## 2013-12-25 DIAGNOSIS — L639 Alopecia areata, unspecified: Secondary | ICD-10-CM | POA: Diagnosis not present

## 2014-01-08 ENCOUNTER — Ambulatory Visit: Payer: Self-pay | Admitting: Physician Assistant

## 2014-01-28 ENCOUNTER — Encounter: Payer: Self-pay | Admitting: Physician Assistant

## 2014-01-28 ENCOUNTER — Ambulatory Visit (INDEPENDENT_AMBULATORY_CARE_PROVIDER_SITE_OTHER): Payer: Medicare Other | Admitting: Physician Assistant

## 2014-01-28 VITALS — BP 122/78 | HR 68 | Temp 98.2°F | Resp 16 | Ht 72.5 in | Wt 239.0 lb

## 2014-01-28 DIAGNOSIS — I10 Essential (primary) hypertension: Secondary | ICD-10-CM

## 2014-01-28 DIAGNOSIS — M109 Gout, unspecified: Secondary | ICD-10-CM

## 2014-01-28 DIAGNOSIS — R7309 Other abnormal glucose: Secondary | ICD-10-CM | POA: Diagnosis not present

## 2014-01-28 DIAGNOSIS — Z79899 Other long term (current) drug therapy: Secondary | ICD-10-CM

## 2014-01-28 DIAGNOSIS — Z23 Encounter for immunization: Secondary | ICD-10-CM

## 2014-01-28 DIAGNOSIS — Z1331 Encounter for screening for depression: Secondary | ICD-10-CM

## 2014-01-28 DIAGNOSIS — Z9181 History of falling: Secondary | ICD-10-CM

## 2014-01-28 DIAGNOSIS — R7303 Prediabetes: Secondary | ICD-10-CM

## 2014-01-28 DIAGNOSIS — E559 Vitamin D deficiency, unspecified: Secondary | ICD-10-CM

## 2014-01-28 DIAGNOSIS — E782 Mixed hyperlipidemia: Secondary | ICD-10-CM

## 2014-01-28 DIAGNOSIS — Z0000 Encounter for general adult medical examination without abnormal findings: Secondary | ICD-10-CM

## 2014-01-28 LAB — CBC WITH DIFFERENTIAL/PLATELET
Basophils Absolute: 0 10*3/uL (ref 0.0–0.1)
Basophils Relative: 1 % (ref 0–1)
Eosinophils Absolute: 0.2 10*3/uL (ref 0.0–0.7)
Eosinophils Relative: 5 % (ref 0–5)
HCT: 44.5 % (ref 39.0–52.0)
Hemoglobin: 15.6 g/dL (ref 13.0–17.0)
Lymphocytes Relative: 38 % (ref 12–46)
Lymphs Abs: 1.4 10*3/uL (ref 0.7–4.0)
MCH: 33.5 pg (ref 26.0–34.0)
MCHC: 35.1 g/dL (ref 30.0–36.0)
MCV: 95.7 fL (ref 78.0–100.0)
Monocytes Absolute: 0.5 10*3/uL (ref 0.1–1.0)
Monocytes Relative: 14 % — ABNORMAL HIGH (ref 3–12)
Neutro Abs: 1.5 10*3/uL — ABNORMAL LOW (ref 1.7–7.7)
Neutrophils Relative %: 42 % — ABNORMAL LOW (ref 43–77)
Platelets: 129 10*3/uL — ABNORMAL LOW (ref 150–400)
RBC: 4.65 MIL/uL (ref 4.22–5.81)
RDW: 14.4 % (ref 11.5–15.5)
WBC: 3.6 10*3/uL — ABNORMAL LOW (ref 4.0–10.5)

## 2014-01-28 LAB — BASIC METABOLIC PANEL WITH GFR
BUN: 16 mg/dL (ref 6–23)
CO2: 28 mEq/L (ref 19–32)
Calcium: 9.8 mg/dL (ref 8.4–10.5)
Chloride: 102 mEq/L (ref 96–112)
Creat: 0.9 mg/dL (ref 0.50–1.35)
GFR, Est African American: >89 mL/min
GFR, Est Non African American: 87 mL/min
Glucose, Bld: 88 mg/dL (ref 70–99)
Potassium: 4.3 mEq/L (ref 3.5–5.3)
Sodium: 138 mEq/L (ref 135–145)

## 2014-01-28 LAB — HEPATIC FUNCTION PANEL
ALT: 20 U/L (ref 0–53)
AST: 28 U/L (ref 0–37)
Albumin: 3.9 g/dL (ref 3.5–5.2)
Alkaline Phosphatase: 45 U/L (ref 39–117)
Bilirubin, Direct: 0.2 mg/dL (ref 0.0–0.3)
Indirect Bilirubin: 0.7 mg/dL (ref 0.2–1.2)
Total Bilirubin: 0.9 mg/dL (ref 0.2–1.2)
Total Protein: 6.3 g/dL (ref 6.0–8.3)

## 2014-01-28 LAB — LIPID PANEL
Cholesterol: 179 mg/dL (ref 0–200)
HDL: 54 mg/dL (ref >39)
LDL Cholesterol: 98 mg/dL (ref 0–99)
Total CHOL/HDL Ratio: 3.3 Ratio
Triglycerides: 135 mg/dL (ref <150)
VLDL: 27 mg/dL (ref 0–40)

## 2014-01-28 LAB — HEMOGLOBIN A1C
Hgb A1c MFr Bld: 5.5 % (ref <5.7)
Mean Plasma Glucose: 111 mg/dL (ref <117)

## 2014-01-28 LAB — MAGNESIUM: Magnesium: 1.2 mg/dL — ABNORMAL LOW (ref 1.5–2.5)

## 2014-01-28 NOTE — Patient Instructions (Signed)
Preventative Care for Adults, Male       REGULAR HEALTH EXAMS:  A routine yearly physical is a good way to check in with your primary care provider about your health and preventive screening. It is also an opportunity to share updates about your health and any concerns you have, and receive a thorough all-over exam.   Most health insurance companies pay for at least some preventative services.  Check with your health plan for specific coverages.  WHAT PREVENTATIVE SERVICES DO MEN NEED?  Adult men should have their weight and blood pressure checked regularly.   Men age 35 and older should have their cholesterol levels checked regularly.  Beginning at age 50 and continuing to age 75, men should be screened for colorectal cancer.  Certain people should may need continued testing until age 85.  Other cancer screening may include exams for testicular and prostate cancer.  Updating vaccinations is part of preventative care.  Vaccinations help protect against diseases such as the flu.  Lab tests are generally done as part of preventative care to screen for anemia and blood disorders, to screen for problems with the kidneys and liver, to screen for bladder problems, to check blood sugar, and to check your cholesterol level.  Preventative services generally include counseling about diet, exercise, avoiding tobacco, drugs, excessive alcohol consumption, and sexually transmitted infections.    GENERAL RECOMMENDATIONS FOR GOOD HEALTH:  Healthy diet:  Eat a variety of foods, including fruit, vegetables, animal or vegetable protein, such as meat, fish, chicken, and eggs, or beans, lentils, tofu, and grains, such as rice.  Drink plenty of water daily.  Decrease saturated fat in the diet, avoid lots of red meat, processed foods, sweets, fast foods, and fried foods.  Exercise:  Aerobic exercise helps maintain good heart health. At least 30-40 minutes of moderate-intensity exercise is recommended.  For example, a brisk walk that increases your heart rate and breathing. This should be done on most days of the week.   Find a type of exercise or a variety of exercises that you enjoy so that it becomes a part of your daily life.  Examples are running, walking, swimming, water aerobics, and biking.  For motivation and support, explore group exercise such as aerobic class, spin class, Zumba, Yoga,or  martial arts, etc.    Set exercise goals for yourself, such as a certain weight goal, walk or run in a race such as a 5k walk/run.  Speak to your primary care provider about exercise goals.  Disease prevention:  If you smoke or chew tobacco, find out from your caregiver how to quit. It can literally save your life, no matter how long you have been a tobacco user. If you do not use tobacco, never begin.   Maintain a healthy diet and normal weight. Increased weight leads to problems with blood pressure and diabetes.   The Body Mass Index or BMI is a way of measuring how much of your body is fat. Having a BMI above 27 increases the risk of heart disease, diabetes, hypertension, stroke and other problems related to obesity. Your caregiver can help determine your BMI and based on it develop an exercise and dietary program to help you achieve or maintain this important measurement at a healthful level.  High blood pressure causes heart and blood vessel problems.  Persistent high blood pressure should be treated with medicine if weight loss and exercise do not work.   Fat and cholesterol leaves deposits in your arteries   that can block them. This causes heart disease and vessel disease elsewhere in your body.  If your cholesterol is found to be high, or if you have heart disease or certain other medical conditions, then you may need to have your cholesterol monitored frequently and be treated with medication.   Ask if you should have a stress test if your history suggests this. A stress test is a test done on  a treadmill that looks for heart disease. This test can find disease prior to there being a problem.  Avoid drinking alcohol in excess (more than two drinks per day).  Avoid use of street drugs. Do not share needles with anyone. Ask for professional help if you need assistance or instructions on stopping the use of alcohol, cigarettes, and/or drugs.  Brush your teeth twice a day with fluoride toothpaste, and floss once a day. Good oral hygiene prevents tooth decay and gum disease. The problems can be painful, unattractive, and can cause other health problems. Visit your dentist for a routine oral and dental check up and preventive care every 6-12 months.   Look at your skin regularly.  Use a mirror to look at your back. Notify your caregivers of changes in moles, especially if there are changes in shapes, colors, a size larger than a pencil eraser, an irregular border, or development of new moles.  Safety:  Use seatbelts 100% of the time, whether driving or as a passenger.  Use safety devices such as hearing protection if you work in environments with loud noise or significant background noise.  Use safety glasses when doing any work that could send debris in to the eyes.  Use a helmet if you ride a bike or motorcycle.  Use appropriate safety gear for contact sports.  Talk to your caregiver about gun safety.  Use sunscreen with a SPF (or skin protection factor) of 15 or greater.  Lighter skinned people are at a greater risk of skin cancer. Don't forget to also wear sunglasses in order to protect your eyes from too much damaging sunlight. Damaging sunlight can accelerate cataract formation.   Practice safe sex. Use condoms. Condoms are used for birth control and to help reduce the spread of sexually transmitted infections (or STIs).  Some of the STIs are gonorrhea (the clap), chlamydia, syphilis, trichomonas, herpes, HPV (human papilloma virus) and HIV (human immunodeficiency virus) which causes AIDS.  The herpes, HIV and HPV are viral illnesses that have no cure. These can result in disability, cancer and death.   Keep carbon monoxide and smoke detectors in your home functioning at all times. Change the batteries every 6 months or use a model that plugs into the wall.   Vaccinations:  Stay up to date with your tetanus shots and other required immunizations. You should have a booster for tetanus every 10 years. Be sure to get your flu shot every year, since 5%-20% of the [place] population comes down with the flu. The flu vaccine changes each year, so being vaccinated once is not enough. Get your shot in the fall, before the flu season peaks.   Other vaccines to consider:  Pneumococcal vaccine to protect against certain types of pneumonia.  This is normally recommended for adults age 65 or older.  However, adults younger than 70 years old with certain underlying conditions such as diabetes, heart or lung disease should also receive the vaccine.  Shingles vaccine to protect against Varicella Zoster if you are older than age 60, or younger   than 70 years old with certain underlying illness.  Hepatitis A vaccine to protect against a form of infection of the liver by a virus acquired from food.  Hepatitis B vaccine to protect against a form of infection of the liver by a virus acquired from blood or body fluids, particularly if you work in health care.  If you plan to travel internationally, check with your local health department for specific vaccination recommendations.  Cancer Screening:  Most routine colon cancer screening begins at the age of 50. On a yearly basis, doctors may provide special easy to use take-home tests to check for hidden blood in the stool. Sigmoidoscopy or colonoscopy can detect the earliest forms of colon cancer and is life saving. These tests use a small camera at the end of a tube to directly examine the colon. Speak to your caregiver about this at age 50, when routine  screening begins (and is repeated every 5 years unless early forms of pre-cancerous polyps or small growths are found).   At the age of 50 men usually start screening for prostate cancer every year. Screening may begin at a younger age for those with higher risk. Those at higher risk include African-Americans or having a family history of prostate cancer. There are two types of tests for prostate cancer:   Prostate-specific antigen (PSA) testing. Recent studies raise questions about prostate cancer using PSA and you should discuss this with your caregiver.   Digital rectal exam (in which your doctor's lubricated and gloved finger feels for enlargement of the prostate through the anus).   Screening for testicular cancer.  Do a monthly exam of your testicles. Gently roll each testicle between your thumb and fingers, feeling for any abnormal lumps. The best time to do this is after a hot shower or bath when the tissues are looser. Notify your caregivers of any lumps, tenderness or changes in size or shape immediately.     

## 2014-01-28 NOTE — Progress Notes (Signed)
MEDICARE ANNUAL WELLNESS VISIT AND FOLLOW UP Assessment:   1. HYPERTENSION - CBC with Differential - BASIC METABOLIC PANEL WITH GFR - Hepatic function panel - TSH  2. Prediabetes Discussed general issues about diabetes pathophysiology and management., Educational material distributed., Suggested low cholesterol diet., Encouraged aerobic exercise., Discussed foot care., Reminded to get yearly retinal exam. - Hemoglobin A1c - HM DIABETES FOOT EXAM  3. Encounter for long-term (current) use of other medications - Magnesium  4. Mixed hyperlipidemia - Lipid panel  5. Vitamin D deficiency  6. Gout without tophus, unspecified cause, unspecified chronicity, unspecified site  TDAP this visit due to new grandson Prevnar next visit   Plan:   During the course of the visit the patient was educated and counseled about appropriate screening and preventive services including:    Pneumococcal vaccine   Hepatitis B vaccine  Td vaccine  Screening electrocardiogram  Colorectal cancer screening  Diabetes screening  Glaucoma screening  Nutrition counseling   Screening recommendations, referrals: Vaccinations: Tdap vaccine Will get TDAP due to new grandson Influenza vaccine will get this year Pneumococcal vaccine up to date Shingles vaccine not indicated Hep B vaccine not indicated  Nutrition assessed and recommended  Colonoscopy up to date Recommended yearly ophthalmology/optometry visit for glaucoma screening and checkup Recommended yearly dental visit for hygiene and checkup Advanced directives - requested  Conditions/risks identified: BMI: Discussed weight loss, diet, and increase physical activity.  Increase physical activity: AHA recommends 150 minutes of physical activity a week.  Medications reviewed Diabetes is at goal, ACE/ARB therapy: Yes. Urinary Incontinence is not an issue: discussed non pharmacology and pharmacology options.  Fall risk: moderate- discussed  PT, home fall assessment, medications.    Subjective:  Leroy Griffin is a 68 y.o. male who presents for Medicare Annual Wellness Visit and 3 month follow up for HTN, hyperlipidemia, prediabetes, and vitamin D Def.  Date of last medicare wellness visit was is unknown.  His blood pressure has been controlled at home, today their BP is BP: 122/78 mmHg He does workout. He denies chest pain, shortness of breath, dizziness.  He is on cholesterol medication and denies myalgias. His cholesterol is at goal. The cholesterol last visit was:   Lab Results  Component Value Date   CHOL 205* 08/27/2013   HDL 84 08/27/2013   LDLCALC 94 08/27/2013   TRIG 133 08/27/2013   CHOLHDL 2.4 08/27/2013   He has been working on diet and exercise for prediabetes, and denies paresthesia of the feet, polydipsia and polyuria. Last A1C in the office was:  Lab Results  Component Value Date   HGBA1C 5.7* 08/27/2013   Patient is on Vitamin D supplement.   He has chronic left shoulder pain and back pain, he states he is very active and does stationary bike and works out in his yard.  He has a new grandson Leroy Griffin in July.   Names of Other Physician/Practitioners you currently use: 1. [facility] here for primary care 2. Dr. Margarette AsalFreemond, [facility] doctor, last visit yearly 3. Dr. Pollyann KennedyaylorDr/ [place] , dentist, last visit q 3 months Patient Care Team: Lucky CowboyWilliam McKeown, MD as PCP - General (Internal Medicine) Zada Girtichard F Fox, MD as Consulting Physician (Nephrology) Vertell NovakJames L Edwards Jr., MD as Consulting Physician (Gastroenterology) Jacqlyn Krausshristina L Haverstock, MD as Referring Physician (Dermatology) Mittie BodoJeffrey L Klein, MD as Referring Physician (Cardiology)  Medication Review: Current Outpatient Prescriptions on File Prior to Visit  Medication Sig Dispense Refill  . acetaminophen-codeine (  TYLENOL #3) 300-30 MG per tablet TAKE 1 TABLET BY MOUTH FOUR TIMES DAILY AS NEEDED FOR PAIN  100  tablet  0  . allopurinol (ZYLOPRIM) 300 MG tablet Take 300 mg by mouth daily. Takes 1/2 tab daily      . Cholecalciferol (VITAMIN D PO) Take 2,000 Units by mouth 2 (two) times daily.      . CRESTOR 20 MG tablet TAKE 1 TABLET BY MOUTH DAILY FOR CHOLESTEROL  30 tablet  3  . diazepam (VALIUM) 5 MG tablet Take 1 tablet (5 mg total) by mouth every 8 (eight) hours as needed.  90 tablet  0  . Flaxseed, Linseed, (FLAX SEED OIL) 1000 MG CAPS Take 1,000 mg by mouth daily.      Marland Kitchen. losartan (COZAAR) 100 MG tablet Take 0.5 tablets (50 mg total) by mouth 2 (two) times daily.  90 tablet  1  . magnesium gluconate (MAGONATE) 500 MG tablet Take 500 mg by mouth 2 (two) times daily.      . Omega-3 Fatty Acids (FISH OIL) 1200 MG CPDR Take by mouth 2 (two) times daily.      . vitamin B-12 (CYANOCOBALAMIN) 1000 MCG tablet Take 1,000 mcg by mouth daily.      Marland Kitchen. zinc gluconate 50 MG tablet Take 50 mg by mouth daily.       No current facility-administered medications on file prior to visit.    Current Problems (verified) Patient Active Problem List   Diagnosis Date Noted  . Sinus pause   With blocked PACs, PVCs and sinus arrhythmia 10/10/2013  . Encounter for long-term (current) use of other medications 10/06/2013  . Mixed hyperlipidemia 08/27/2013  . Essential tremor   . Proteinuria   . Prediabetes   . Gout   . Vitamin D deficiency   . Rupture of left biceps tendon 08/09/2012  . Left rotator cuff syndrome 06/11/2012  . Numbness of fingers of both hands 06/11/2012  . HYPERTENSION 10/10/2010    Screening Tests Health Maintenance  Topic Date Due  . Colonoscopy  03/09/1994  . Influenza Vaccine  03/08/2014  . Tetanus/tdap  06/01/2020  . Pneumococcal Polysaccharide Vaccine Age 70 And Over  Completed  . Zostavax  Completed    Immunization History  Administered Date(s) Administered  . Influenza Split 05/23/2013  . Pneumococcal Polysaccharide-23 06/01/2010  . Td 06/01/2010  . Zoster 08/08/2004     Preventative care: Last colonoscopy: 2013  Prior vaccinations: TD or Tdap: 2011  Influenza: 2014 Pneumococcal: 2011 Prevnar Next visit Shingles/Zostavax: 2006  History reviewed: allergies, current medications, past family history, past medical history, past social history, past surgical history and problem list   Risk Factors: Tobacco History  Substance Use Topics  . Smoking status: Never Smoker   . Smokeless tobacco: Not on file  . Alcohol Use: 7.0 oz/week    14 drink(s) per week   He does not smoke.  Patient is not a former smoker. Are there smokers in your home (other than you)?  No  Alcohol Current alcohol use: social drinker  Caffeine Current caffeine use: coffee 1 /day  Exercise Current exercise: bicycling and yard work  Nutrition/Diet Current diet: in general, a "healthy" diet    Cardiac risk factors: advanced age (older than 8155 for men, 4665 for women), dyslipidemia, hypertension, male gender and obesity (BMI >= 30 kg/m2).  Depression Screen (Note: if answer to either of the following is "Yes", a more complete depression screening is indicated)   Q1: Over the past two  weeks, have you felt down, depressed or hopeless? No  Q2: Over the past two weeks, have you felt little interest or pleasure in doing things? No  Have you lost interest or pleasure in daily life? No  Do you often feel hopeless? No  Do you cry easily over simple problems? No  Activities of Daily Living In your present state of health, do you have any difficulty performing the following activities?:  Driving? No Managing money?  No Feeding yourself? No Getting from bed to chair? No Climbing a flight of stairs? No Preparing food and eating?: No Bathing or showering? No Getting dressed: No Getting to the toilet? No Using the toilet:No Moving around from place to place: No In the past year have you fallen or had a near fall?:Yes while carrying things   Are you sexually active?   No  Do you have more than one partner?  No  Vision Difficulties: No  Hearing Difficulties: No Do you often ask people to speak up or repeat themselves? No Do you experience ringing or noises in your ears? No Do you have difficulty understanding soft or whispered voices? No  Cognition  Do you feel that you have a problem with memory?No  Do you often misplace items? No  Do you feel safe at home?  Yes  Advanced directives Does patient have a Health Care Power of Attorney? Yes Does patient have a Living Will? Yes   Objective:   Blood pressure 122/78, pulse 68, temperature 98.2 F (36.8 C), resp. rate 16, height 6' 0.5" (1.842 m), weight 239 lb (108.41 kg). Body mass index is 31.95 kg/(m^2).  General appearance: alert, no distress, WD/WN, male Cognitive Testing  Alert? Yes  Normal Appearance?Yes  Oriented to person? Yes  Place? Yes   Time? Yes  Recall of three objects?  Yes  Can perform simple calculations? Yes  Displays appropriate judgment?Yes  Can read the correct time from a watch face?Yes  HEENT: normocephalic, sclerae anicteric, TMs pearly, nares patent, no discharge or erythema, pharynx normal Oral cavity: MMM, no lesions Neck: supple, no lymphadenopathy, no thyromegaly, no masses Heart: RRR, normal S1, S2, no murmurs Lungs: CTA bilaterally, no wheezes, rhonchi, or rales Abdomen: +bs, soft, non tender, non distended, no masses, no hepatomegaly, no splenomegaly Musculoskeletal: nontender, no swelling, no obvious deformity Extremities: no edema, no cyanosis, no clubbing Pulses: 2+ symmetric, upper and lower extremities, normal cap refill Neurological: alert, oriented x 3, CN2-12 intact, strength normal upper extremities and lower extremities, sensation normal throughout, DTRs 2+ throughout, no cerebellar signs, gait normal Psychiatric: normal affect, behavior normal, pleasant   Medicare Attestation I have personally reviewed: The patient's medical and social  history Their use of alcohol, tobacco or illicit drugs Their current medications and supplements The patient's functional ability including ADLs,fall risks, home safety risks, cognitive, and hearing and visual impairment Diet and physical activities Evidence for depression or mood disorders  The patient's weight, height, BMI, and visual acuity have been recorded in the chart.  I have made referrals, counseling, and provided education to the patient based on review of the above and I have provided the patient with a written personalized care plan for preventive services.     Quentin Mulling, PA-C   01/28/2014

## 2014-01-29 LAB — TSH: TSH: 2.082 u[IU]/mL (ref 0.350–4.500)

## 2014-02-04 DIAGNOSIS — L639 Alopecia areata, unspecified: Secondary | ICD-10-CM | POA: Diagnosis not present

## 2014-03-20 DIAGNOSIS — L639 Alopecia areata, unspecified: Secondary | ICD-10-CM | POA: Diagnosis not present

## 2014-03-27 ENCOUNTER — Other Ambulatory Visit: Payer: Self-pay | Admitting: Internal Medicine

## 2014-04-10 ENCOUNTER — Ambulatory Visit: Payer: Self-pay | Admitting: Internal Medicine

## 2014-04-23 DIAGNOSIS — M67919 Unspecified disorder of synovium and tendon, unspecified shoulder: Secondary | ICD-10-CM | POA: Diagnosis not present

## 2014-04-24 DIAGNOSIS — L639 Alopecia areata, unspecified: Secondary | ICD-10-CM | POA: Diagnosis not present

## 2014-04-25 ENCOUNTER — Other Ambulatory Visit: Payer: Self-pay | Admitting: Physician Assistant

## 2014-04-25 ENCOUNTER — Other Ambulatory Visit: Payer: Self-pay | Admitting: Internal Medicine

## 2014-04-30 DIAGNOSIS — M19019 Primary osteoarthritis, unspecified shoulder: Secondary | ICD-10-CM | POA: Diagnosis not present

## 2014-05-02 ENCOUNTER — Ambulatory Visit: Payer: Self-pay | Admitting: Internal Medicine

## 2014-05-05 DIAGNOSIS — M25819 Other specified joint disorders, unspecified shoulder: Secondary | ICD-10-CM | POA: Diagnosis not present

## 2014-05-06 ENCOUNTER — Ambulatory Visit: Payer: Medicare Other | Admitting: Internal Medicine

## 2014-05-06 ENCOUNTER — Encounter: Payer: Self-pay | Admitting: Internal Medicine

## 2014-05-06 NOTE — Progress Notes (Signed)
Patient ID: Brantley StageHeino W Mooney, male   DOB: 02/23/1946, 68 y.o.   MRN: [id number]  [facility]

## 2014-05-14 ENCOUNTER — Encounter: Payer: Self-pay | Admitting: Internal Medicine

## 2014-05-14 ENCOUNTER — Ambulatory Visit (INDEPENDENT_AMBULATORY_CARE_PROVIDER_SITE_OTHER): Payer: Medicare Other | Admitting: Internal Medicine

## 2014-05-14 VITALS — BP 122/74 | HR 64 | Temp 98.2°F | Resp 16 | Ht 72.5 in | Wt 227.0 lb

## 2014-05-14 DIAGNOSIS — Z79899 Other long term (current) drug therapy: Secondary | ICD-10-CM | POA: Diagnosis not present

## 2014-05-14 DIAGNOSIS — I10 Essential (primary) hypertension: Secondary | ICD-10-CM

## 2014-05-14 DIAGNOSIS — Z23 Encounter for immunization: Secondary | ICD-10-CM

## 2014-05-14 DIAGNOSIS — R7303 Prediabetes: Secondary | ICD-10-CM

## 2014-05-14 DIAGNOSIS — Z716 Tobacco abuse counseling: Secondary | ICD-10-CM

## 2014-05-14 DIAGNOSIS — E782 Mixed hyperlipidemia: Secondary | ICD-10-CM | POA: Diagnosis not present

## 2014-05-14 DIAGNOSIS — F17210 Nicotine dependence, cigarettes, uncomplicated: Secondary | ICD-10-CM | POA: Diagnosis not present

## 2014-05-14 DIAGNOSIS — R7309 Other abnormal glucose: Secondary | ICD-10-CM | POA: Diagnosis not present

## 2014-05-14 DIAGNOSIS — E559 Vitamin D deficiency, unspecified: Secondary | ICD-10-CM

## 2014-05-14 NOTE — Patient Instructions (Signed)

## 2014-05-14 NOTE — Progress Notes (Signed)
Patient ID: Leroy Griffin, male   DOB: 05/24/1946, 68 y.o.   MRN: [id number]   This very nice 68 y.o.male presents for 3 month follow up with Hypertension, Hyperlipidemia, Pre-Diabetes and Vitamin D Deficiency.    Patient is treated for HTN (1998)& BP has been controlled at home. Today's BP: 122/74 mmHg.  Patient has had no complaints of any cardiac type chest pain, palpitations, dyspnea/orthopnea/PND, dizziness, claudication, or dependent edema. Patient is being followed by Nephrology    Hyperlipidemia is controlled with diet & meds. Patient denies myalgias or other med SE's. Last Lipids were Total Chol 179; HDL  54; LDL 98; Trig 135 on 01/28/2014.   Also, the patient has history of PreDiabetes and has had no symptoms of reactive hypoglycemia, diabetic polys, paresthesias or visual blurring.  Last A1c was  5.5% on 01/28/2014.    Further, the patient also has history of Vitamin D Deficiency and supplements vitamin D without any suspected side-effects. Last vitamin D was 08/27/2013: Vit D, 25-Hydroxy 72 on    Medication List   acetaminophen-codeine 300-30 MG per tablet  Commonly known as:  TYLENOL #3  TAKE 1 TABLET BY MOUTH FOUR TIMES DAILY AS NEEDED FOR PAIN     allopurinol 300 MG tablet  Commonly known as:  ZYLOPRIM  TAKE 1/2 TABLET BY MOUTH DAILY FOR GOUT     CRESTOR 20 MG tablet  Generic drug:  rosuvastatin  TAKE 1 TABLET BY MOUTH EVERY DAY FOR CHOLESTEROL     diazepam 5 MG tablet  Commonly known as:  VALIUM  Take 1 tablet (5 mg total) by mouth every 8 (eight) hours as needed.     Fish Oil 1200 MG Cpdr  Take by mouth 2 (two) times daily.     Flax Seed Oil 1000 MG Caps  Take 1,000 mg by mouth daily.     losartan 100 MG tablet  Commonly known as:  COZAAR  TAKE 1/2 TABLET BY MOUTH TWICE DAILY     magnesium gluconate 500 MG tablet  Commonly known as:  MAGONATE  Take 500 mg by mouth 2 (two) times daily.     vitamin B-12 1000 MCG tablet  Commonly known as:  CYANOCOBALAMIN  Take  1,000 mcg by mouth daily.     VITAMIN D PO  Take 2,000 Units by mouth 2 (two) times daily.     zinc gluconate 50 MG tablet  Take 50 mg by mouth daily.     Allergies  Allergen Reactions  . Aspirin     Internal bleeding  . Welchol [Colesevelam Hcl] Diarrhea  . Vasotec [Enalaprilat] Rash   PMHx:   Past Medical History  Diagnosis Date  . Hypertension   . Hyperlipidemia   . Prediabetes   . Gout   . Vitamin D deficiency   . Essential tremor   . Proteinuria    FHx:    Reviewed / unchanged  SHx:    Reviewed / unchanged  Systems Review:  Constitutional: Denies fever, chills, wt changes, headaches, insomnia, fatigue, night sweats, change in appetite. Eyes: Denies redness, blurred vision, diplopia, discharge, itchy, watery eyes.  ENT: Denies discharge, congestion, post nasal drip, epistaxis, sore throat, earache, hearing loss, dental pain, tinnitus, vertigo, sinus pain, snoring.  CV: Denies chest pain, palpitations, irregular heartbeat, syncope, dyspnea, diaphoresis, orthopnea, PND, claudication or edema. Respiratory: denies cough, dyspnea, DOE, pleurisy, hoarseness, laryngitis, wheezing.  Gastrointestinal: Denies dysphagia, odynophagia, heartburn, reflux, water brash, abdominal pain or cramps, nausea, vomiting, bloating, diarrhea, constipation, hematemesis, melena,  hematochezia  or hemorrhoids. Genitourinary: Denies dysuria, frequency, urgency, nocturia, hesitancy, discharge, hematuria or flank pain. Musculoskeletal: Denies arthralgias, myalgias, stiffness, jt. swelling, pain, limping or strain/sprain.  Skin: Denies pruritus, rash, hives, warts, acne, eczema or change in skin lesion(s). Neuro: No weakness, tremor, incoordination, spasms, paresthesia or pain. Psychiatric: Denies confusion, memory loss or sensory loss. Endo: Denies change in weight, skin or hair change.  Heme/Lymph: No excessive bleeding, bruising or enlarged lymph nodes.  Exam:  BP 122/74  Pulse 64  Temp  98.2  F   Resp 16  Ht 6' 0.5"   Wt 227 lb    BMI 30.35  Appears well nourished and in no distress. Eyes: PERRLA, EOMs, conjunctiva no swelling or erythema. Sinuses: No frontal/maxillary tenderness ENT/Mouth: EAC's clear, TM's nl w/o erythema, bulging. Nares clear w/o erythema, swelling, exudates. Oropharynx clear without erythema or exudates. Oral hygiene is good. Tongue normal, non obstructing. Hearing intact.  Neck: Supple. Thyroid nl. Car 2+/2+ without bruits, nodes or JVD. Chest: Respirations nl with BS clear & equal w/o rales, rhonchi, wheezing or stridor.  Cor: Heart sounds normal w/ regular rate and rhythm without sig. murmurs, gallops, clicks, or rubs. Peripheral pulses normal and equal  without edema.  Abdomen: Soft & bowel sounds normal. Non-tender w/o guarding, rebound, hernias, masses, or organomegaly.  Lymphatics: Unremarkable.  Musculoskeletal: Full ROM all peripheral extremities, joint stability, 5/5 strength, and normal gait.  Skin: Warm, dry without exposed rashes, lesions or ecchymosis apparent.  Neuro: Cranial nerves intact, reflexes equal bilaterally. Sensory-motor testing grossly intact. Tendon reflexes grossly intact.  Pysch: Alert & oriented x 3.  Insight and judgement nl & appropriate. No ideations.  Assessment and Plan:  1. Hypertension - Continue monitor blood pressure at home. Continue diet/meds same.  2. Hyperlipidemia - Continue diet/meds, exercise,& lifestyle modifications. Continue monitor periodic cholesterol/liver & renal functions   3. T2_NIDDM Pre-Diabetes - Continue diet, exercise, lifestyle modifications. Monitor appropriate labs.  4. Vitamin D Deficiency - Continue supplementation.   Recommended regular exercise, BP monitoring, weight control, and discussed med and SE's. Recommended labs to assess and monitor clinical status. Further disposition pending results of labs.

## 2014-05-15 LAB — CBC WITH DIFFERENTIAL/PLATELET
Basophils Absolute: 0 10*3/uL (ref 0.0–0.1)
Basophils Relative: 1 % (ref 0–1)
Eosinophils Absolute: 0.1 10*3/uL (ref 0.0–0.7)
Eosinophils Relative: 2 % (ref 0–5)
HCT: 46.9 % (ref 39.0–52.0)
Hemoglobin: 16.7 g/dL (ref 13.0–17.0)
Lymphocytes Relative: 37 % (ref 12–46)
Lymphs Abs: 1.3 10*3/uL (ref 0.7–4.0)
MCH: 33.4 pg (ref 26.0–34.0)
MCHC: 35.6 g/dL (ref 30.0–36.0)
MCV: 93.8 fL (ref 78.0–100.0)
Monocytes Absolute: 0.5 10*3/uL (ref 0.1–1.0)
Monocytes Relative: 13 % — ABNORMAL HIGH (ref 3–12)
Neutro Abs: 1.6 10*3/uL — ABNORMAL LOW (ref 1.7–7.7)
Neutrophils Relative %: 47 % (ref 43–77)
Platelets: 147 10*3/uL — ABNORMAL LOW (ref 150–400)
RBC: 5 MIL/uL (ref 4.22–5.81)
RDW: 14.9 % (ref 11.5–15.5)
WBC: 3.5 10*3/uL — ABNORMAL LOW (ref 4.0–10.5)

## 2014-05-15 LAB — BASIC METABOLIC PANEL WITH GFR
BUN: 23 mg/dL (ref 6–23)
CO2: 27 mEq/L (ref 19–32)
Calcium: 9.9 mg/dL (ref 8.4–10.5)
Chloride: 98 mEq/L (ref 96–112)
Creat: 0.91 mg/dL (ref 0.50–1.35)
GFR, Est African American: >89 mL/min
GFR, Est Non African American: 85 mL/min
Glucose, Bld: 129 mg/dL — ABNORMAL HIGH (ref 70–99)
Potassium: 4.2 mEq/L (ref 3.5–5.3)
Sodium: 137 mEq/L (ref 135–145)

## 2014-05-15 LAB — HEPATIC FUNCTION PANEL
ALT: 20 U/L (ref 0–53)
AST: 25 U/L (ref 0–37)
Albumin: 4.1 g/dL (ref 3.5–5.2)
Alkaline Phosphatase: 46 U/L (ref 39–117)
Bilirubin, Direct: 0.2 mg/dL (ref 0.0–0.3)
Indirect Bilirubin: 0.8 mg/dL (ref 0.2–1.2)
Total Bilirubin: 1 mg/dL (ref 0.2–1.2)
Total Protein: 6.7 g/dL (ref 6.0–8.3)

## 2014-05-15 LAB — LIPID PANEL
Cholesterol: 201 mg/dL — ABNORMAL HIGH (ref 0–200)
HDL: 91 mg/dL (ref >39)
LDL Cholesterol: 90 mg/dL (ref 0–99)
Total CHOL/HDL Ratio: 2.2 Ratio
Triglycerides: 98 mg/dL (ref <150)
VLDL: 20 mg/dL (ref 0–40)

## 2014-05-15 LAB — MAGNESIUM: Magnesium: 1.7 mg/dL (ref 1.5–2.5)

## 2014-05-15 LAB — VITAMIN D 25 HYDROXY (VIT D DEFICIENCY, FRACTURES): Vit D, 25-Hydroxy: 73 ng/mL (ref 30–89)

## 2014-05-15 LAB — TSH: TSH: 2.444 u[IU]/mL (ref 0.350–4.500)

## 2014-05-15 LAB — HEMOGLOBIN A1C
Hgb A1c MFr Bld: 5.5 % (ref <5.7)
Mean Plasma Glucose: 111 mg/dL (ref <117)

## 2014-05-15 LAB — INSULIN, FASTING: Insulin fasting, serum: 32.2 u[IU]/mL — ABNORMAL HIGH (ref 2.0–19.6)

## 2014-05-26 DIAGNOSIS — M67912 Unspecified disorder of synovium and tendon, left shoulder: Secondary | ICD-10-CM | POA: Diagnosis not present

## 2014-05-27 DIAGNOSIS — L639 Alopecia areata, unspecified: Secondary | ICD-10-CM | POA: Diagnosis not present

## 2014-06-30 ENCOUNTER — Other Ambulatory Visit: Payer: Self-pay | Admitting: *Deleted

## 2014-06-30 ENCOUNTER — Encounter: Payer: Self-pay | Admitting: Internal Medicine

## 2014-06-30 MED ORDER — ROSUVASTATIN CALCIUM 20 MG PO TABS: ORAL_TABLET | ORAL | Status: DC

## 2014-08-26 ENCOUNTER — Ambulatory Visit (INDEPENDENT_AMBULATORY_CARE_PROVIDER_SITE_OTHER): Payer: Medicare Other | Admitting: Physician Assistant

## 2014-08-26 ENCOUNTER — Encounter: Payer: Self-pay | Admitting: Physician Assistant

## 2014-08-26 VITALS — BP 132/78 | HR 76 | Temp 97.9°F | Resp 16 | Ht 72.5 in | Wt 225.0 lb

## 2014-08-26 DIAGNOSIS — I10 Essential (primary) hypertension: Secondary | ICD-10-CM | POA: Diagnosis not present

## 2014-08-26 DIAGNOSIS — E559 Vitamin D deficiency, unspecified: Secondary | ICD-10-CM

## 2014-08-26 DIAGNOSIS — R7309 Other abnormal glucose: Secondary | ICD-10-CM | POA: Diagnosis not present

## 2014-08-26 DIAGNOSIS — M791 Myalgia, unspecified site: Secondary | ICD-10-CM

## 2014-08-26 DIAGNOSIS — R809 Proteinuria, unspecified: Secondary | ICD-10-CM

## 2014-08-26 DIAGNOSIS — R7303 Prediabetes: Secondary | ICD-10-CM

## 2014-08-26 DIAGNOSIS — E782 Mixed hyperlipidemia: Secondary | ICD-10-CM

## 2014-08-26 DIAGNOSIS — Z79899 Other long term (current) drug therapy: Secondary | ICD-10-CM

## 2014-08-26 DIAGNOSIS — E663 Overweight: Secondary | ICD-10-CM | POA: Insufficient documentation

## 2014-08-26 DIAGNOSIS — E669 Obesity, unspecified: Secondary | ICD-10-CM

## 2014-08-26 LAB — CBC WITH DIFFERENTIAL/PLATELET
Basophils Absolute: 0 10*3/uL (ref 0.0–0.1)
Basophils Relative: 0 % (ref 0–1)
Eosinophils Absolute: 0.1 10*3/uL (ref 0.0–0.7)
Eosinophils Relative: 2 % (ref 0–5)
HCT: 48.1 % (ref 39.0–52.0)
Hemoglobin: 16.5 g/dL (ref 13.0–17.0)
Lymphocytes Relative: 33 % (ref 12–46)
Lymphs Abs: 1.7 10*3/uL (ref 0.7–4.0)
MCH: 33.1 pg (ref 26.0–34.0)
MCHC: 34.3 g/dL (ref 30.0–36.0)
MCV: 96.6 fL (ref 78.0–100.0)
MPV: 9.5 fL (ref 8.6–12.4)
Monocytes Absolute: 0.5 10*3/uL (ref 0.1–1.0)
Monocytes Relative: 10 % (ref 3–12)
Neutro Abs: 2.8 10*3/uL (ref 1.7–7.7)
Neutrophils Relative %: 55 % (ref 43–77)
Platelets: 152 10*3/uL (ref 150–400)
RBC: 4.98 MIL/uL (ref 4.22–5.81)
RDW: 13.9 % (ref 11.5–15.5)
WBC: 5 10*3/uL (ref 4.0–10.5)

## 2014-08-26 LAB — HEMOGLOBIN A1C
Hgb A1c MFr Bld: 5.3 % (ref <5.7)
Mean Plasma Glucose: 105 mg/dL (ref <117)

## 2014-08-26 NOTE — Progress Notes (Signed)
Assessment and Plan:  Hypertension: Continue medication, monitor blood pressure at home. Continue DASH diet.  Reminder to go to the ER if any CP, SOB, nausea, dizziness, severe HA, changes vision/speech, left arm numbness and tingling, and jaw pain. Cholesterol: Continue diet and exercise. Check cholesterol.  Pre-diabetes-Continue diet and exercise. Check A1C Vitamin D Def- check level and continue medications.  Myalgias- stop statin, take NSAIDS PRN, increase fluids, will check CPK, TSH, ESR, magnesium, potassium, denies recent tick exposure. Gout- recheck Uric acid as needed, Diet discussed, continue medications.  Continue diet and meds as discussed. Further disposition pending results of labs.  HPI 69 y.o. male  presents for 3 month follow up with hypertension, hyperlipidemia, prediabetes and vitamin D. His blood pressure has been controlled at home, today their BP is BP: 132/78 mmHg He does not workout. He denies chest pain, shortness of breath, dizziness.  He is on cholesterol medication, crestor 66m and has fatigue/myalgias through out the day, has pain ankle, calves, knees. Some leg weakness, sees Dr. EEllene Routefor lower back pain.  His cholesterol is at goal. The cholesterol last visit was:   Lab Results  Component Value Date   CHOL 201* 05/14/2014   HDL 91 05/14/2014   LDLCALC 90 05/14/2014   TRIG 98 05/14/2014   CHOLHDL 2.2 05/14/2014  He has been working on diet and exercise for prediabetes, and denies paresthesia of the feet, polydipsia, polyuria and visual disturbances. Last A1C in the office was:  Lab Results  Component Value Date   HGBA1C 5.5 05/14/2014  Patient is on Vitamin D supplement.   Lab Results  Component Value Date   VD25OH 73 05/14/2014   BMI is Body mass index is 30.08 kg/(m^2)., he is working on diet and exercise. Wt Readings from Last 3 Encounters:  08/26/14 225 lb (102.059 kg)  05/14/14 227 lb (102.967 kg)  01/28/14 239 lb (108.41 kg)  Patient is on  allopurinol 1/2 tablet for gout and does not report a recent flare.   Current Medications:  Current Outpatient Prescriptions on File Prior to Visit  Medication Sig Dispense Refill  . acetaminophen-codeine (TYLENOL #3) 300-30 MG per tablet TAKE 1 TABLET BY MOUTH FOUR TIMES DAILY AS NEEDED FOR PAIN 100 tablet 0  . allopurinol (ZYLOPRIM) 300 MG tablet TAKE 1/2 TABLET BY MOUTH DAILY FOR GOUT 45 tablet 1  . Cholecalciferol (VITAMIN D PO) Take 2,000 Units by mouth 2 (two) times daily.    . diazepam (VALIUM) 5 MG tablet Take 1 tablet (5 mg total) by mouth every 8 (eight) hours as needed. 90 tablet 0  . Flaxseed, Linseed, (FLAX SEED OIL) 1000 MG CAPS Take 1,000 mg by mouth daily.    .Marland Kitchenlosartan (COZAAR) 100 MG tablet TAKE 1/2 TABLET BY MOUTH TWICE DAILY 90 tablet 1  . magnesium gluconate (MAGONATE) 500 MG tablet Take 500 mg by mouth 2 (two) times daily.    . Omega-3 Fatty Acids (FISH OIL) 1200 MG CPDR Take by mouth 2 (two) times daily.    . rosuvastatin (CRESTOR) 20 MG tablet TAKE 1 TABLET BY MOUTH EVERY DAY FOR CHOLESTEROL 30 tablet 2  . vitamin B-12 (CYANOCOBALAMIN) 1000 MCG tablet Take 1,000 mcg by mouth daily.    .Marland Kitchenzinc gluconate 50 MG tablet Take 50 mg by mouth daily.     No current facility-administered medications on file prior to visit.   Medical History:  Past Medical History  Diagnosis Date  . Hypertension   . Hyperlipidemia   . Prediabetes   .  Gout   . Vitamin D deficiency   . Essential tremor   . Proteinuria    Allergies:  Allergies  Allergen Reactions  . Aspirin     Internal bleeding  . Welchol [Colesevelam Hcl] Diarrhea  . Vasotec [Enalaprilat] Rash    Review of Systems:  Review of Systems  Constitutional: Negative.   HENT: Negative.   Respiratory: Negative.   Cardiovascular: Negative.   Gastrointestinal: Positive for diarrhea (depends on food). Negative for heartburn, nausea, vomiting, abdominal pain, constipation, blood in stool and melena.  Genitourinary:  Negative.   Musculoskeletal: Positive for myalgias (x 1 year) and back pain. Negative for joint pain, falls and neck pain.  Skin: Negative.   Neurological: Positive for focal weakness (legs). Negative for dizziness, tingling, tremors, sensory change, speech change, seizures and loss of consciousness.  Psychiatric/Behavioral: Negative.      Family history- Review and unchanged Social history- Review and unchanged Physical Exam: BP 132/78 mmHg  Pulse 76  Temp(Src) 97.9 F (36.6 C)  Resp 16  Ht 6' 0.5" (1.842 m)  Wt 225 lb (102.059 kg)  BMI 30.08 kg/m2 Wt Readings from Last 3 Encounters:  08/26/14 225 lb (102.059 kg)  05/14/14 227 lb (102.967 kg)  01/28/14 239 lb (108.41 kg)   General Appearance: Well nourished, in no apparent distress. Eyes: PERRLA, EOMs, conjunctiva no swelling or erythema Sinuses: No Frontal/maxillary tenderness ENT/Mouth: Ext aud canals clear, TMs without erythema, bulging. No erythema, swelling, or exudate on post pharynx.  Tonsils not swollen or erythematous. Hearing normal.  Neck: Supple, thyroid normal.  Respiratory: Respiratory effort normal, BS equal bilaterally without rales, rhonchi, wheezing or stridor.  Cardio: RRR with no MRGs. Brisk peripheral pulses without edema.  Abdomen: Soft, + BS.  Non tender, no guarding, rebound, hernias, masses. Lymphatics: Non tender without lymphadenopathy.  Musculoskeletal: Full ROM, 5/5 strength, normal gait.  Skin: Warm, dry without rashes, lesions, ecchymosis.  Neuro: Cranial nerves intact. Normal muscle tone, no cerebellar symptoms. Sensation intact. Decreased reflexes left leg. Psych: Awake and oriented X 3, normal affect, Insight and Judgment appropriate.    Vicie Mutters, PA-C 2:34 PM [facility]

## 2014-08-26 NOTE — Patient Instructions (Signed)
Muscle aches Stop the statin for 1 week and then start back after we get the lab back. We are getting a lab that will look for muscle break down with the statin.  We will check your potassium, magnesium to see if these are low which can cause muscle aches.  Please make sure you are taking 64 oz of water a day as long as you do now have a heart condition.  -Please take Tylenol or Aleve for pain. -You can take tylenol (500mg ) or tylenol arthritis (650mg ). The max you can take of tylenol a day is 3000mg  daily, this is a max of 6 pills a day of the regular tyelnol (500mg ) or a max of 4 a day of the tylenol arthritis (650mg ) as long as no other medications you are taking contain tylenol.  -Aleve/Naproxen Sodium- can take 1-2 in the morning and 1 at night. Max 3 a day. Take with food to avoid ulcers. Does affect your kidney function so use sparingly.   Benefiber is good for constipation/diarrhea/irritable bowel syndrome, it helps with weight loss and can help lower your bad cholesterol. Please do 1-2 TBSP in the morning in water, coffee, or tea. It can take up to a month before you can see a difference with your bowel movements. It is cheapest from [employer], [employer], [employer].

## 2014-08-27 LAB — INSULIN, FASTING: Insulin fasting, serum: 9.9 u[IU]/mL (ref 2.0–19.6)

## 2014-08-27 LAB — HEPATIC FUNCTION PANEL
ALT: 29 U/L (ref 0–53)
AST: 40 U/L — ABNORMAL HIGH (ref 0–37)
Albumin: 4 g/dL (ref 3.5–5.2)
Alkaline Phosphatase: 46 U/L (ref 39–117)
Bilirubin, Direct: 0.2 mg/dL (ref 0.0–0.3)
Indirect Bilirubin: 0.6 mg/dL (ref 0.2–1.2)
Total Bilirubin: 0.8 mg/dL (ref 0.2–1.2)
Total Protein: 6.7 g/dL (ref 6.0–8.3)

## 2014-08-27 LAB — BASIC METABOLIC PANEL WITH GFR
BUN: 17 mg/dL (ref 6–23)
CO2: 27 mEq/L (ref 19–32)
Calcium: 10.1 mg/dL (ref 8.4–10.5)
Chloride: 104 mEq/L (ref 96–112)
Creat: 0.93 mg/dL (ref 0.50–1.35)
GFR, Est African American: >89 mL/min
GFR, Est Non African American: 83 mL/min
Glucose, Bld: 88 mg/dL (ref 70–99)
Potassium: 4.6 mEq/L (ref 3.5–5.3)
Sodium: 140 mEq/L (ref 135–145)

## 2014-08-27 LAB — LIPID PANEL
Cholesterol: 187 mg/dL (ref 0–200)
HDL: 58 mg/dL (ref >39)
LDL Cholesterol: 70 mg/dL (ref 0–99)
Total CHOL/HDL Ratio: 3.2 Ratio
Triglycerides: 295 mg/dL — ABNORMAL HIGH (ref <150)
VLDL: 59 mg/dL — ABNORMAL HIGH (ref 0–40)

## 2014-08-27 LAB — TSH: TSH: 1.572 u[IU]/mL (ref 0.350–4.500)

## 2014-08-27 LAB — CK: Total CK: 78 U/L (ref 7–232)

## 2014-08-27 LAB — MAGNESIUM: Magnesium: 1.6 mg/dL (ref 1.5–2.5)

## 2014-08-27 LAB — SEDIMENTATION RATE: Sed Rate: 7 mm/hr (ref 0–16)

## 2014-09-04 ENCOUNTER — Encounter: Payer: Self-pay | Admitting: Physician Assistant

## 2014-09-08 MED ORDER — ATORVASTATIN CALCIUM 20 MG PO TABS: ORAL_TABLET | ORAL | Status: DC

## 2014-09-09 DIAGNOSIS — L639 Alopecia areata, unspecified: Secondary | ICD-10-CM | POA: Diagnosis not present

## 2014-09-17 ENCOUNTER — Other Ambulatory Visit: Payer: Self-pay | Admitting: Physician Assistant

## 2014-10-08 ENCOUNTER — Encounter: Payer: Self-pay | Admitting: Internal Medicine

## 2014-10-21 DIAGNOSIS — L639 Alopecia areata, unspecified: Secondary | ICD-10-CM | POA: Diagnosis not present

## 2014-11-17 ENCOUNTER — Other Ambulatory Visit: Payer: Self-pay | Admitting: Internal Medicine

## 2014-11-28 DIAGNOSIS — N189 Chronic kidney disease, unspecified: Secondary | ICD-10-CM | POA: Diagnosis not present

## 2014-11-28 DIAGNOSIS — R809 Proteinuria, unspecified: Secondary | ICD-10-CM | POA: Diagnosis not present

## 2014-12-01 DIAGNOSIS — E785 Hyperlipidemia, unspecified: Secondary | ICD-10-CM | POA: Diagnosis not present

## 2014-12-01 DIAGNOSIS — N189 Chronic kidney disease, unspecified: Secondary | ICD-10-CM | POA: Diagnosis not present

## 2014-12-01 DIAGNOSIS — R809 Proteinuria, unspecified: Secondary | ICD-10-CM | POA: Diagnosis not present

## 2014-12-01 DIAGNOSIS — I129 Hypertensive chronic kidney disease with stage 1 through stage 4 chronic kidney disease, or unspecified chronic kidney disease: Secondary | ICD-10-CM | POA: Diagnosis not present

## 2014-12-02 DIAGNOSIS — L639 Alopecia areata, unspecified: Secondary | ICD-10-CM | POA: Diagnosis not present

## 2014-12-03 ENCOUNTER — Encounter: Payer: Self-pay | Admitting: Internal Medicine

## 2014-12-03 ENCOUNTER — Ambulatory Visit (INDEPENDENT_AMBULATORY_CARE_PROVIDER_SITE_OTHER): Payer: Medicare Other | Admitting: Internal Medicine

## 2014-12-03 VITALS — BP 112/64 | HR 68 | Temp 97.9°F | Resp 16 | Ht 72.5 in | Wt 222.8 lb

## 2014-12-03 DIAGNOSIS — Z9181 History of falling: Secondary | ICD-10-CM

## 2014-12-03 DIAGNOSIS — Z23 Encounter for immunization: Secondary | ICD-10-CM | POA: Diagnosis not present

## 2014-12-03 DIAGNOSIS — E782 Mixed hyperlipidemia: Secondary | ICD-10-CM

## 2014-12-03 DIAGNOSIS — Z1212 Encounter for screening for malignant neoplasm of rectum: Secondary | ICD-10-CM

## 2014-12-03 DIAGNOSIS — Z79899 Other long term (current) drug therapy: Secondary | ICD-10-CM | POA: Diagnosis not present

## 2014-12-03 DIAGNOSIS — R7309 Other abnormal glucose: Secondary | ICD-10-CM | POA: Diagnosis not present

## 2014-12-03 DIAGNOSIS — M109 Gout, unspecified: Secondary | ICD-10-CM

## 2014-12-03 DIAGNOSIS — E559 Vitamin D deficiency, unspecified: Secondary | ICD-10-CM

## 2014-12-03 DIAGNOSIS — R7303 Prediabetes: Secondary | ICD-10-CM

## 2014-12-03 DIAGNOSIS — I10 Essential (primary) hypertension: Secondary | ICD-10-CM | POA: Diagnosis not present

## 2014-12-03 DIAGNOSIS — Z125 Encounter for screening for malignant neoplasm of prostate: Secondary | ICD-10-CM

## 2014-12-03 DIAGNOSIS — G250 Essential tremor: Secondary | ICD-10-CM

## 2014-12-03 DIAGNOSIS — Z1331 Encounter for screening for depression: Secondary | ICD-10-CM

## 2014-12-03 LAB — CBC WITH DIFFERENTIAL/PLATELET
Basophils Absolute: 0 10*3/uL (ref 0.0–0.1)
Basophils Relative: 0 % (ref 0–1)
Eosinophils Absolute: 0.1 10*3/uL (ref 0.0–0.7)
Eosinophils Relative: 2 % (ref 0–5)
HCT: 46.9 % (ref 39.0–52.0)
Hemoglobin: 16.1 g/dL (ref 13.0–17.0)
Lymphocytes Relative: 28 % (ref 12–46)
Lymphs Abs: 1.3 10*3/uL (ref 0.7–4.0)
MCH: 33.6 pg (ref 26.0–34.0)
MCHC: 34.3 g/dL (ref 30.0–36.0)
MCV: 97.9 fL (ref 78.0–100.0)
MPV: 9.2 fL (ref 8.6–12.4)
Monocytes Absolute: 0.5 10*3/uL (ref 0.1–1.0)
Monocytes Relative: 10 % (ref 3–12)
Neutro Abs: 2.9 10*3/uL (ref 1.7–7.7)
Neutrophils Relative %: 60 % (ref 43–77)
Platelets: 160 10*3/uL (ref 150–400)
RBC: 4.79 MIL/uL (ref 4.22–5.81)
RDW: 14.7 % (ref 11.5–15.5)
WBC: 4.8 10*3/uL (ref 4.0–10.5)

## 2014-12-03 LAB — LIPID PANEL
Cholesterol: 218 mg/dL — ABNORMAL HIGH (ref 0–200)
HDL: 59 mg/dL (ref >=40)
LDL Cholesterol: 118 mg/dL — ABNORMAL HIGH (ref 0–99)
Total CHOL/HDL Ratio: 3.7 Ratio
Triglycerides: 205 mg/dL — ABNORMAL HIGH (ref <150)
VLDL: 41 mg/dL — ABNORMAL HIGH (ref 0–40)

## 2014-12-03 LAB — BASIC METABOLIC PANEL WITH GFR
BUN: 18 mg/dL (ref 6–23)
CO2: 28 mEq/L (ref 19–32)
Calcium: 10 mg/dL (ref 8.4–10.5)
Chloride: 101 mEq/L (ref 96–112)
Creat: 0.86 mg/dL (ref 0.50–1.35)
GFR, Est African American: >89 mL/min
GFR, Est Non African American: 88 mL/min
Glucose, Bld: 122 mg/dL — ABNORMAL HIGH (ref 70–99)
Potassium: 4.5 mEq/L (ref 3.5–5.3)
Sodium: 139 mEq/L (ref 135–145)

## 2014-12-03 LAB — HEMOGLOBIN A1C
Hgb A1c MFr Bld: 5.5 % (ref <5.7)
Mean Plasma Glucose: 111 mg/dL (ref <117)

## 2014-12-03 LAB — HEPATIC FUNCTION PANEL
ALT: 21 U/L (ref 0–53)
AST: 29 U/L (ref 0–37)
Albumin: 4.1 g/dL (ref 3.5–5.2)
Alkaline Phosphatase: 49 U/L (ref 39–117)
Bilirubin, Direct: 0.2 mg/dL (ref 0.0–0.3)
Indirect Bilirubin: 0.7 mg/dL (ref 0.2–1.2)
Total Bilirubin: 0.9 mg/dL (ref 0.2–1.2)
Total Protein: 7.3 g/dL (ref 6.0–8.3)

## 2014-12-03 LAB — MAGNESIUM: Magnesium: 1.7 mg/dL (ref 1.5–2.5)

## 2014-12-03 LAB — URIC ACID: Uric Acid, Serum: 4.2 mg/dL (ref 4.0–7.8)

## 2014-12-03 LAB — TSH: TSH: 1.35 u[IU]/mL (ref 0.350–4.500)

## 2014-12-03 MED ORDER — PROPRANOLOL HCL ER 120 MG PO CP24: 120.0000 mg | ORAL_CAPSULE | Freq: Every day | ORAL | Status: DC

## 2014-12-03 NOTE — Progress Notes (Signed)
Patient ID: Leroy Griffin, male   DOB: 09/19/1945, 69 y.o.   MRN: [id number]  Annual Comprehensive Examination  This very nice MWM presents for complete physical.  Patient has been followed for HTN, Prediabetes, Hyperlipidemia, and Vitamin D Deficiency.   HTN predates since [id number]. Patient's BP has been controlled at home.Today's BP: 112/64 mmHg. Patient denies any cardiac symptoms as chest pain, palpitations, shortness of breath, dizziness or ankle swelling. Several years ago patient was referred to Dr Caryn SectionFox for proteinuria with concerns that it might be due to Crestor. He had since deen started back on Crestor until recently changed due to very high co-pay and was switched to atorvastatin.     Patient's hyperlipidemia is controlled with diet and medications. Patient denies myalgias or other medication SE's. Last lipids were at goal - Total Chol  187; HDL 58; LDL  70; but with elevated Triglycerides 295 on 08/26/2014.   Patient has morbid obesity and  is monitored for prediabetes and patient denies reactive hypoglycemic symptoms, visual blurring, diabetic polys or paresthesias. Last A1c was 5.3% on 08/26/2014.   Finally, patient has history of Vitamin D Deficiency of 11 in 2009 and last vitamin D was  73 on 05/14/2014.      Medication Sig  . acetaminophen-codeine (TYLENOL #3) 300-30 MG per tablet TAKE 1 TABLET BY MOUTH FOUR TIMES DAILY  . allopurinol (ZYLOPRIM) 300 MG tablet TAKE 1/2 TABLET BY MOUTH DAILY FOR GOUT  . atorvastatin (LIPITOR) 20 MG tablet 1/2-1 pill daily for cholesterol  . VITAMIN D  Take 2,000 Units by mouth 2 (two) times daily.  . Diazepam 5 MG tablet Take 1 tablet (5 mg total) by mouth every 8 (eight) hours as needed.  Marland Kitchen. FLAX SEED OIL 1000 MG CAPS Take 1,000 mg by mouth daily.  Marland Kitchen. losartan  100 MG tablet TAKE 1/2 TABLET BY MOUTH TWICE DAILY  . magnesium gluconate  500 MG tablet Take 500 mg by mouth 2 (two) times daily.  Marland Kitchen. FISH OIL 1200 MG  Take by mouth 2 (two) times daily.  .  vitamin B-12  1000 MCG tablet Take 1,000 mcg by mouth daily.  Marland Kitchen. zinc gluconate 50 MG tablet Take 50 mg by mouth daily.   Allergies  Allergen Reactions  . Aspirin     Internal bleeding  . Welchol [Colesevelam Hcl] Diarrhea  . Vasotec [Enalaprilat] Rash   Past Medical History  Diagnosis Date  . Hypertension   . Hyperlipidemia   . Prediabetes   . Gout   . Vitamin D deficiency   . Essential tremor   . Proteinuria    Health Maintenance  Topic Date Due  . COLONOSCOPY  03/09/1994  . PNA vac Low Risk Adult (2 of 2 - PCV13) 06/02/2011  . INFLUENZA VACCINE  03/09/2015  . TETANUS/TDAP  01/29/2024  . ZOSTAVAX  Completed   Immunization History  Administered Date(s) Administered  . Influenza Split 05/23/2013  . Influenza, High Dose Seasonal PF 05/14/2014  . Pneumococcal Polysaccharide-23 06/01/2010  . Td 06/01/2010  . Tdap 01/28/2014  . Zoster 08/08/2004   Past Surgical History  Procedure Laterality Date  . Rt knee     Family History  Problem Relation Age of Onset  . Dementia Mother   . Stroke Father    History   Social History  . Marital Status: Married    Spouse Name: N/A  . Number of Children: N/A  . Years of Education: N/A   Occupational History  . 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   Social History Main Topics  . Smoking status: Never Smoker   . Smokeless tobacco: Not on file  . Alcohol Use: 7.0 oz/week    14 drink(s) per week  . Drug Use: No  . Sexual Activity: Not on file    ROS Constitutional: Denies fever, chills, weight loss/gain, headaches, insomnia,  night sweats or change in appetite. Does c/o fatigue. Eyes: Denies redness, blurred vision, diplopia, discharge, itchy or watery eyes.  ENT: Denies discharge, congestion, post nasal drip, epistaxis, sore throat, earache, hearing loss, dental pain, Tinnitus, Vertigo, Sinus pain or snoring.  Cardio: Denies chest pain, palpitations, irregular heartbeat, syncope,  dyspnea, diaphoresis, orthopnea, PND, claudication or edema Respiratory: denies cough, dyspnea, DOE, pleurisy, hoarseness, laryngitis or wheezing.  Gastrointestinal: Denies dysphagia, heartburn, reflux, water brash, pain, cramps, nausea, vomiting, bloating, diarrhea, constipation, hematemesis, melena, hematochezia, jaundice or hemorrhoids Genitourinary: Denies dysuria, frequency, urgency, nocturia, hesitancy, discharge, hematuria or flank pain Musculoskeletal: Denies arthralgia, myalgia, stiffness, Jt. Swelling, pain, limp or strain/sprain. Denies Falls. Skin: Denies puritis, rash, hives, warts, acne, eczema or change in skin lesion Neuro: No weakness, tremor, incoordination, spasms, paresthesia or pain Psychiatric: Denies confusion, memory loss or sensory loss. Denies Depression. Endocrine: Denies change in weight, skin, hair change, nocturia, and paresthesia, diabetic polys, visual blurring or hyper / hypo glycemic episodes.  Heme/Lymph: No excessive bleeding, bruising or enlarged lymph nodes.  Physical Exam  BP 112/64   Pulse 68  Temp 97.9 F   Resp 16  Ht 6' 0.5"   Wt 222 lb 12.8 oz     BMI 29.79   General Appearance: Well nourished, in no apparent distress. Eyes: PERRLA, EOMs, conjunctiva no swelling or erythema, normal fundi and vessels. Sinuses: No frontal/maxillary tenderness ENT/Mouth: EACs patent / TMs  nl. Nares clear without erythema, swelling, mucoid exudates. Oral hygiene is good. No erythema, swelling, or exudate. Tongue normal, non-obstructing. Tonsils not swollen or erythematous. Hearing normal.  Neck: Supple, thyroid normal. No bruits, nodes or JVD. Respiratory: Respiratory effort normal.  BS equal and clear bilateral without rales, rhonci, wheezing or stridor. Cardio: Heart sounds are normal with regular rate and rhythm and no murmurs, rubs or gallops. Peripheral pulses are normal and equal bilaterally without edema. No aortic or femoral bruits. Chest: symmetric with  normal excursions and percussion.  Abdomen: Flat, soft, with bowl sounds. Nontender, no guarding, rebound, hernias, masses, or organomegaly.  Lymphatics: Non tender without lymphadenopathy.  Genitourinary: No hernias.Testes nl. DRE - prostate nl for age - smooth & firm w/o nodules. Musculoskeletal: Full ROM all peripheral extremities, joint stability, 5/5 strength, and normal gait. Skin: Warm and dry without rashes, lesions, cyanosis, clubbing or  ecchymosis.  Neuro: Cranial nerves intact, reflexes equal bilaterally. Normal muscle tone, no cerebellar symptoms. Sensation intact.  Pysch: Awake and oriented X 3 with normal affect, insight and judgment appropriate.   Assessment and Plan  1. Essential hypertension  - Microalbumin / creatinine urine ratio - TSH  2. Hyperlipidemia  - Lipid panel  3. Prediabetes  - Hemoglobin A1c - Insulin, random  4. Vitamin D deficiency  - Vit D  25 hydroxy (rtn osteoporosis monitoring)  5. Gout   - Uric acid  6. Essential tremor   7. Morbid obesity   8. Screening for rectal cancer  - POC Hemoccult Bld/Stl (3-Cd Home Screen); Future  9. Screening for prostate cancer   10. Depression screen   11. At low risk for fall   12.  Medication management  - Urine Microscopic - CBC with Differential/Platelet - BASIC METABOLIC PANEL WITH GFR - Hepatic function panel - Magnesium   Continue prudent diet as discussed, weight control, BP monitoring, regular exercise, and medications as discussed.  Discussed med effects and SE's. Routine screening labs and tests as requested with regular follow-up as recommended. Over 40 minutes of exam, counseling &  chart review was performed

## 2014-12-03 NOTE — Patient Instructions (Signed)
++++++++++++++++++++++++++++++++++  Recommend Low dose or baby Aspirin 81 mg daily   To reduce risk of Colon Cancer 20 %, Skin Cancer 26 % , Melanoma 46% and   Pancreatic cancer 60%  +++++++++++++++++++++++++++++++++ +++++++++++++++++++++++++++++++++++++++++++++++++++++++++++  Vitamin D goal is between 70-100.   Please make sure that you are taking your Vitamin D as directed.   It is very important as a natural antiinflammatory   helping hair, skin, and nails, as well as reducing stroke and heart attack risk.   It helps your bones and helps with mood.  It also decreases numerous cancer risks so please take it as directed.   +++++++++++++++++++++++++++++++++++++++++++++++++++++++++++  Recommend the book "The END of DIETING" by Dr Joel Fuhrman   & the book "The END of DIABETES " by Dr Joel Fuhrman  At [employer] - get book & Audio CD's     Being diabetic has a  300% increased risk for heart attack, stroke, cancer, and alzheimer- type vascular dementia. It is very important that you work harder with diet by avoiding all foods that are white. Avoid white rice (brown & wild rice is OK), white potatoes (sweetpotatoes in moderation is OK), White bread or wheat bread or anything made out of white flour like bagels, donuts, rolls, buns, biscuits, cakes, pastries, cookies, pizza crust, and pasta (made from white flour & egg whites) - vegetarian pasta or spinach or wheat pasta is OK. Multigrain breads like Arnold's or Pepper[employer], or multigrain sandwich thins or flatbreads.  Diet, exercise and weight loss can reverse and cure diabetes in the early stages.  Diet, exercise and weight loss is very important in the control and prevention of complications of diabetes which affects every system in your body, ie. Brain - dementia/stroke, eyes - glaucoma/blindness, heart - heart attack/heart failure, kidneys - dialysis, stomach - gastric paralysis, intestines - malabsorption, nerves - severe painful  neuritis, circulation - gangrene & loss of a leg(s), and finally cancer and Alzheimers.    I recommend avoid fried & greasy foods,  sweets/candy, white rice (brown or wild rice or Quinoa is OK), white potatoes (sweet potatoes are OK) - anything made from white flour - bagels, doughnuts, rolls, buns, biscuits,white and wheat breads, pizza crust and traditional pasta made of white flour & egg white(vegetarian pasta or spinach or wheat pasta is OK).  Multi-grain bread is OK - like multi-grain flat bread or sandwich thins. Avoid alcohol in excess. Exercise is also important.    Eat all the vegetables you want - avoid meat, especially red meat and dairy - especially cheese.  Cheese is the most concentrated form of trans-fats which is the worst thing to clog up our arteries. Veggie cheese is OK which can be found in the fresh produce section at [employer] or [employer] or [employer]  ++++++++++++++++++++++++++++++++++++++++++++++++++++++++ Preventive Care for Adults A healthy lifestyle and preventive care can promote health and wellness. Preventive health guidelines for men include the following key practices:  A routine yearly physical is a good way to check with your health care provider about your health and preventative screening. It is a chance to share any concerns and updates on your health and to receive a thorough exam.  Visit your dentist for a routine exam and preventative care every 6 months. Brush your teeth twice a day and floss once a day. Good oral hygiene prevents tooth decay and gum disease.  The frequency of eye exams is based on your age, health, family medical history, use of contact lenses,   and other factors. Follow your health care provider's recommendations for frequency of eye exams.  Eat a healthy diet. Foods such as vegetables, fruits, whole grains, low-fat dairy products, and lean protein foods contain the nutrients you need without too many calories. Decrease your intake of  foods high in solid fats, added sugars, and salt. Eat the right amount of calories for you.Get information about a proper diet from your health care provider, if necessary.  Regular physical exercise is one of the most important things you can do for your health. Most adults should get at least 150 minutes of moderate-intensity exercise (any activity that increases your heart rate and causes you to sweat) each week. In addition, most adults need muscle-strengthening exercises on 2 or more days a week.  Maintain a healthy weight. The body mass index (BMI) is a screening tool to identify possible weight problems. It provides an estimate of body fat based on height and weight. Your health care provider can find your BMI and can help you achieve or maintain a healthy weight.For adults 20 years and older:  A BMI below 18.5 is considered underweight.  A BMI of 18.5 to 24.9 is normal.  A BMI of 25 to 29.9 is considered overweight.  A BMI of 30 and above is considered obese.  Maintain normal blood lipids and cholesterol levels by exercising and minimizing your intake of saturated fat. Eat a balanced diet with plenty of fruit and vegetables. Blood tests for lipids and cholesterol should begin at age 20 and be repeated every 5 years. If your lipid or cholesterol levels are high, you are over 50, or you are at high risk for heart disease, you may need your cholesterol levels checked more frequently.Ongoing high lipid and cholesterol levels should be treated with medicines if diet and exercise are not working.  If you smoke, find out from your health care provider how to quit. If you do not use tobacco, do not start.  Lung cancer screening is recommended for adults aged 55-80 years who are at high risk for developing lung cancer because of a history of smoking. A yearly low-dose CT scan of the lungs is recommended for people who have at least a 30-pack-year history of smoking and are a current smoker or  have quit within the past 15 years. A pack year of smoking is smoking an average of 1 pack of cigarettes a day for 1 year (for example: 1 pack a day for 30 years or 2 packs a day for 15 years). Yearly screening should continue until the smoker has stopped smoking for at least 15 years. Yearly screening should be stopped for people who develop a health problem that would prevent them from having lung cancer treatment.  If you choose to drink alcohol, do not have more than 2 drinks per day. One drink is considered to be 12 ounces (355 mL) of beer, 5 ounces (148 mL) of wine, or 1.5 ounces (44 mL) of liquor.  Avoid use of street drugs. Do not share needles with anyone. Ask for help if you need support or instructions about stopping the use of drugs.  High blood pressure causes heart disease and increases the risk of stroke. Your blood pressure should be checked at least every 1-2 years. Ongoing high blood pressure should be treated with medicines, if weight loss and exercise are not effective.  If you are 45-79 years old, ask your health care provider if you should take aspirin to prevent heart disease.    Diabetes screening involves taking a blood sample to check your fasting blood sugar level. Testing should be considered at a younger age or be carried out more frequently if you are overweight and have at least 1 risk factor for diabetes.  Colorectal cancer can be detected and often prevented. Most routine colorectal cancer screening begins at the age of 50 and continues through age 75. However, your health care provider may recommend screening at an earlier age if you have risk factors for colon cancer. On a yearly basis, your health care provider may provide home test kits to check for hidden blood in the stool. Use of a small camera at the end of a tube to directly examine the colon (sigmoidoscopy or colonoscopy) can detect the earliest forms of colorectal cancer. Talk to your health care provider about  this at age 50, when routine screening begins. Direct exam of the colon should be repeated every 5-10 years through age 75, unless early forms of precancerous polyps or small growths are found.  Hepatitis C blood testing is recommended for all people born from 1945 through 1965 and any individual with known risks for hepatitis C.  Screening for abdominal aortic aneurysm (AAA)  by ultrasound is recommended for people who have history of high blood pressure or who are current or former smokers.  Healthy men should  receive prostate-specific antigen (PSA) blood tests as part of routine cancer screening. Talk with your health care provider about prostate cancer screening.  Testicular cancer screening is  recommended for adult males. Screening includes self-exam, a health care provider exam, and other screening tests. Consult with your health care provider about any symptoms you have or any concerns you have about testicular cancer.  Use sunscreen. Apply sunscreen liberally and repeatedly throughout the day. You should seek shade when your shadow is shorter than you. Protect yourself by wearing long sleeves, pants, a wide-brimmed hat, and sunglasses year round, whenever you are outdoors.  Once a month, do a whole-body skin exam, using a mirror to look at the skin on your back. Tell your health care provider about new moles, moles that have irregular borders, moles that are larger than a pencil eraser, or moles that have changed in shape or color.  Stay current with required vaccines (immunizations).  Influenza vaccine. All adults should be immunized every year.  Tetanus, diphtheria, and acellular pertussis (Td, Tdap) vaccine. An adult who has not previously received Tdap or who does not know his vaccine status should receive 1 dose of Tdap. This initial dose should be followed by tetanus and diphtheria toxoids (Td) booster doses every 10 years. Adults with an unknown or incomplete history of completing  a 3-dose immunization series with Td-containing vaccines should begin or complete a primary immunization series including a Tdap dose. Adults should receive a Td booster every 10 years.  Zoster vaccine. One dose is recommended for adults aged 60 years or older unless certain conditions are present.    PREVNAR - Pneumococcal 13-valent conjugate (PCV13) vaccine. When indicated, a person who is uncertain of his immunization history and has no record of immunization should receive the PCV13 vaccine. An adult aged 19 years or older who has certain medical conditions and has not been previously immunized should receive 1 dose of PCV13 vaccine. This PCV13 should be followed with a dose of pneumococcal polysaccharide (PPSV23) vaccine. The PPSV23 vaccine dose should be obtained at least 8 weeks after the dose of PCV13 vaccine. An adult aged 19 years or   older who has certain medical conditions and previously received 1 or more doses of PPSV23 vaccine should receive 1 dose of PCV13. The PCV13 vaccine dose should be obtained 1 or more years after the last PPSV23 vaccine dose.    PNEUMOVAX - Pneumococcal polysaccharide (PPSV23) vaccine. When PCV13 is also indicated, PCV13 should be obtained first. All adults aged 65 years and older should be immunized. An adult younger than age 65 years who has certain medical conditions should be immunized. Any person who resides in a nursing home or long-term care facility should be immunized. An adult smoker should be immunized. People with an immunocompromised condition and certain other conditions should receive both PCV13 and PPSV23 vaccines. People with human immunodeficiency virus (HIV) infection should be immunized as soon as possible after diagnosis. Immunization during chemotherapy or radiation therapy should be avoided. Routine use of PPSV23 vaccine is not recommended for American Indians, [place] Natives, or people younger than 65 years unless there are medical conditions  that require PPSV23 vaccine. When indicated, people who have unknown immunization and have no record of immunization should receive PPSV23 vaccine. One-time revaccination 5 years after the first dose of PPSV23 is recommended for people aged 19-64 years who have chronic kidney failure, nephrotic syndrome, asplenia, or immunocompromised conditions. People who received 1-2 doses of PPSV23 before age 65 years should receive another dose of PPSV23 vaccine at age 65 years or later if at least 5 years have passed since the previous dose. Doses of PPSV23 are not needed for people immunized with PPSV23 at or after age 65 years.    Hepatitis A vaccine. Adults who wish to be protected from this disease, have certain high-risk conditions, work with hepatitis A-infected animals, work in hepatitis A research labs, or travel to or work in countries with a high rate of hepatitis A should be immunized. Adults who were previously unvaccinated and who anticipate close contact with an international adoptee during the first 60 days after arrival in the [place] from a country with a high rate of hepatitis A should be immunized.    Hepatitis B vaccine. Adults should be immunized if they wish to be protected from this disease, have certain high-risk conditions, may be exposed to blood or other infectious body fluids, are household contacts or sex partners of hepatitis B positive people, are clients or workers in certain care facilities, or travel to or work in countries with a high rate of hepatitis B.   Preventive Service / Frequency   Ages 65 and over  Blood pressure check.  Lipid and cholesterol check.  Lung cancer screening. / Every year if you are aged 55-80 years and have a 30-pack-year history of smoking and currently smoke or have quit within the past 15 years. Yearly screening is stopped once you have quit smoking for at least 15 years or develop a health problem that would prevent you from having lung  cancer treatment.  Fecal occult blood test (FOBT) of stool. You may not have to do this test if you get a colonoscopy every 10 years.  Flexible sigmoidoscopy** or colonoscopy.** / Every 5 years for a flexible sigmoidoscopy or every 10 years for a colonoscopy beginning at age 50 and continuing until age 75.  Hepatitis C blood test.** / For all people born from 1945 through 1965 and any individual with known risks for hepatitis C.  Abdominal aortic aneurysm (AAA) screening./ Screening current or former smokers or have Hypertension.  Skin self-exam. / Monthly.  Influenza   vaccine. / Every year.  Tetanus, diphtheria, and acellular pertussis (Tdap/Td) vaccine.** / 1 dose of Td every 10 years.   Zoster vaccine.** / 1 dose for adults aged 60 years or older.         Pneumococcal 13-valent conjugate (PCV13) vaccine.    Pneumococcal polysaccharide (PPSV23) vaccine.     Hepatitis A vaccine.** / Consult your health care provider.  Hepatitis B vaccine.** / Consult your health care provider. Screening for abdominal aortic aneurysm (AAA)  by ultrasound is recommended for people who have history of high blood pressure or who are current or former smokers. 

## 2014-12-04 LAB — URINALYSIS, MICROSCOPIC ONLY
Bacteria, UA: NONE SEEN
Casts: NONE SEEN
Crystals: NONE SEEN
Squamous Epithelial / LPF: NONE SEEN

## 2014-12-04 LAB — INSULIN, RANDOM: Insulin: 16.7 u[IU]/mL (ref 2.0–19.6)

## 2014-12-04 LAB — VITAMIN D 25 HYDROXY (VIT D DEFICIENCY, FRACTURES): Vit D, 25-Hydroxy: 46 ng/mL (ref 30–100)

## 2014-12-13 NOTE — Addendum Note (Signed)
Addended by: Rubert Frediani A on: 12/13/2014 01:17 PM   Modules accepted: Orders

## 2014-12-13 NOTE — Addendum Note (Signed)
Addended by: Bernece Gall A on: 12/13/2014 12:06 PM   Modules accepted: Orders

## 2015-01-02 DIAGNOSIS — Z886 Allergy status to analgesic agent status: Secondary | ICD-10-CM | POA: Diagnosis not present

## 2015-01-02 DIAGNOSIS — Z823 Family history of stroke: Secondary | ICD-10-CM | POA: Diagnosis not present

## 2015-01-02 DIAGNOSIS — E785 Hyperlipidemia, unspecified: Secondary | ICD-10-CM | POA: Diagnosis not present

## 2015-01-02 DIAGNOSIS — K922 Gastrointestinal hemorrhage, unspecified: Secondary | ICD-10-CM | POA: Diagnosis not present

## 2015-01-02 DIAGNOSIS — K319 Disease of stomach and duodenum, unspecified: Secondary | ICD-10-CM | POA: Diagnosis not present

## 2015-01-02 DIAGNOSIS — R0602 Shortness of breath: Secondary | ICD-10-CM | POA: Diagnosis not present

## 2015-01-02 DIAGNOSIS — K921 Melena: Secondary | ICD-10-CM | POA: Diagnosis not present

## 2015-01-02 DIAGNOSIS — Z79899 Other long term (current) drug therapy: Secondary | ICD-10-CM | POA: Diagnosis not present

## 2015-01-02 DIAGNOSIS — K2960 Other gastritis without bleeding: Secondary | ICD-10-CM | POA: Diagnosis not present

## 2015-01-02 DIAGNOSIS — I10 Essential (primary) hypertension: Secondary | ICD-10-CM | POA: Diagnosis not present

## 2015-01-03 DIAGNOSIS — K253 Acute gastric ulcer without hemorrhage or perforation: Secondary | ICD-10-CM | POA: Diagnosis not present

## 2015-01-03 DIAGNOSIS — K922 Gastrointestinal hemorrhage, unspecified: Secondary | ICD-10-CM | POA: Diagnosis not present

## 2015-01-03 DIAGNOSIS — K921 Melena: Secondary | ICD-10-CM | POA: Diagnosis not present

## 2015-01-03 DIAGNOSIS — K259 Gastric ulcer, unspecified as acute or chronic, without hemorrhage or perforation: Secondary | ICD-10-CM | POA: Diagnosis not present

## 2015-01-09 DIAGNOSIS — H259 Unspecified age-related cataract: Secondary | ICD-10-CM | POA: Diagnosis not present

## 2015-01-14 ENCOUNTER — Ambulatory Visit: Payer: Medicare Other | Admitting: Internal Medicine

## 2015-01-14 ENCOUNTER — Encounter: Payer: Self-pay | Admitting: Internal Medicine

## 2015-01-14 ENCOUNTER — Ambulatory Visit (INDEPENDENT_AMBULATORY_CARE_PROVIDER_SITE_OTHER): Payer: Medicare Other | Admitting: Physician Assistant

## 2015-01-14 VITALS — BP 126/60 | HR 68 | Temp 97.9°F | Resp 16 | Ht 77.0 in | Wt 217.6 lb

## 2015-01-14 DIAGNOSIS — I10 Essential (primary) hypertension: Secondary | ICD-10-CM | POA: Diagnosis not present

## 2015-01-14 DIAGNOSIS — Z0001 Encounter for general adult medical examination with abnormal findings: Secondary | ICD-10-CM | POA: Diagnosis not present

## 2015-01-14 DIAGNOSIS — R809 Proteinuria, unspecified: Secondary | ICD-10-CM

## 2015-01-14 DIAGNOSIS — I455 Other specified heart block: Secondary | ICD-10-CM

## 2015-01-14 DIAGNOSIS — R7303 Prediabetes: Secondary | ICD-10-CM

## 2015-01-14 DIAGNOSIS — Z0000 Encounter for general adult medical examination without abnormal findings: Secondary | ICD-10-CM

## 2015-01-14 DIAGNOSIS — E559 Vitamin D deficiency, unspecified: Secondary | ICD-10-CM

## 2015-01-14 DIAGNOSIS — R6889 Other general symptoms and signs: Secondary | ICD-10-CM

## 2015-01-14 DIAGNOSIS — R7309 Other abnormal glucose: Secondary | ICD-10-CM | POA: Diagnosis not present

## 2015-01-14 DIAGNOSIS — M25512 Pain in left shoulder: Secondary | ICD-10-CM

## 2015-01-14 DIAGNOSIS — Z9181 History of falling: Secondary | ICD-10-CM

## 2015-01-14 DIAGNOSIS — K253 Acute gastric ulcer without hemorrhage or perforation: Secondary | ICD-10-CM

## 2015-01-14 DIAGNOSIS — K259 Gastric ulcer, unspecified as acute or chronic, without hemorrhage or perforation: Secondary | ICD-10-CM

## 2015-01-14 DIAGNOSIS — D62 Acute posthemorrhagic anemia: Secondary | ICD-10-CM

## 2015-01-14 DIAGNOSIS — M109 Gout, unspecified: Secondary | ICD-10-CM

## 2015-01-14 DIAGNOSIS — Z79899 Other long term (current) drug therapy: Secondary | ICD-10-CM

## 2015-01-14 DIAGNOSIS — E782 Mixed hyperlipidemia: Secondary | ICD-10-CM

## 2015-01-14 DIAGNOSIS — G250 Essential tremor: Secondary | ICD-10-CM

## 2015-01-14 HISTORY — DX: Gastric ulcer, unspecified as acute or chronic, without hemorrhage or perforation: K25.9

## 2015-01-14 NOTE — Progress Notes (Signed)
MEDICARE ANNUAL WELLNESS VISIT AND FOLLOW UP Assessment:   1. Essential hypertension - continue medications, DASH diet, exercise and monitor at home. Call if greater than 130/80.  - CBC with Differential/Platelet - BASIC METABOLIC PANEL WITH GFR  2. Prediabetes Discussed general issues about diabetes pathophysiology and management., Educational material distributed., Suggested low cholesterol diet., Encouraged aerobic exercise., Discussed foot care., Reminded to get yearly retinal exam.  3. Sinus pause   With blocked PACs, PVCs and sinus arrhythmia Monitor, asypmptomatic  4. Essential tremor Monitor, avoid triggers  5. Hyperlipidemia -continue medications, check lipids, decrease fatty foods, increase activity.   6. Morbid obesity (BMI 30+) Obesity with co morbidities- long discussion about weight loss, diet, and exercise  7. Left rotator cuff syndrome Follow up ortho PRN, continue medications  8. Medication management  9. Gout without tophus, unspecified cause, unspecified chronicity, unspecified site Gout- recheck Uric acid as needed, Diet discussed, continue medications.  10. Proteinuria Monitor, continue control HTN, sugars, chol  11. Vitamin D deficiency Continue supplement  12. Acute gastric ulcer Continue protonix, no ASA, NSAIDS, alcohol  13. Acute blood loss anemia Check labs, still some mild SOB improving, no CP - Iron and TIBC - Ferritin - Folate RBC   Plan:   During the course of the visit the patient was educated and counseled about appropriate screening and preventive services including:    Pneumococcal vaccine   Hepatitis B vaccine  Td vaccine  Screening electrocardiogram  Colorectal cancer screening  Diabetes screening  Glaucoma screening  Nutrition counseling   Conditions/risks identified: BMI: Discussed weight loss, diet, and increase physical activity.  Increase physical activity: AHA recommends 150 minutes of physical activity a  week.  Medications reviewed Diabetes is at goal, ACE/ARB therapy: Yes. Urinary Incontinence is not an issue: discussed non pharmacology and pharmacology options.  Fall risk: moderate- discussed PT, home fall assessment, medications.    Subjective:  Leroy Griffin is a 69 y.o. male who presents for Medicare Annual Wellness Visit and 3 month follow up for HTN, hyperlipidemia, prediabetes, and vitamin D Def.  Date of last medicare wellness visit was 02/02/2014  His blood pressure has been controlled at home, today their BP is BP: 126/60 mmHg He does workout. He denies chest pain, shortness of breath, dizziness.  He is on cholesterol medication and denies myalgias. His cholesterol is at goal. The cholesterol last visit was:   Lab Results  Component Value Date   CHOL 218* 12/03/2014   HDL 59 12/03/2014   LDLCALC 118* 12/03/2014   TRIG 205* 12/03/2014   CHOLHDL 3.7 12/03/2014   He has been working on diet and exercise for prediabetes, and denies paresthesia of the feet, polydipsia and polyuria. Last A1C in the office was:  Lab Results  Component Value Date   HGBA1C 5.5 12/03/2014   Patient is on Vitamin D supplement.   He has chronic left shoulder pain and back pain, he states he is very active and does stationary bike and works out in his yard.  He has a new grandson Leroy Griffin, 71 year old. He has a history of GI bleeds, most recently he had melana and [facility], had EGD and found small ulcer, He denies NSAID, very rare will have a beer, did start bASA daily, he is off ASA and is on protonix. H Pylori negative.  BUN was 39, Cr 0,93, GFR >60, H/H 10.2/30.6, MCV 100, and platetlets 140.  He still has some epigastric discomfort and some  SOB with exertion, no CP, nausea, dizziness.   Names of Other Physician/Practitioners you currently use: 1. [facility] here for primary care 2. Dr. Margarette Asal, [facility] eye doctor, last  visit yearly 3. Dr. Pollyann Kennedy , dentist, last visit q 3 months Patient Care Team: Lucky Cowboy, MD as PCP - General (Internal Medicine) Marina Gravel, MD as Consulting Physician (Nephrology) Carman Ching, MD as Consulting Physician (Gastroenterology) Jacqlyn Krauss, MD as Referring Physician (Dermatology) Mittie Bodo, MD as Referring Physician (Cardiology)  Medication Review: Current Outpatient Prescriptions on File Prior to Visit  Medication Sig Dispense Refill  . acetaminophen-codeine (TYLENOL #3) 300-30 MG per tablet TAKE 1 TABLET BY MOUTH FOUR TIMES DAILY 100 tablet 3  . allopurinol (ZYLOPRIM) 300 MG tablet TAKE 1/2 TABLET BY MOUTH DAILY FOR GOUT 45 tablet 3  . atorvastatin (LIPITOR) 20 MG tablet 1/2-1 pill daily for cholesterol 30 tablet 3  . Cholecalciferol (VITAMIN D PO) Take 4,000 Units by mouth 2 (two) times daily.     . diazepam (VALIUM) 5 MG tablet Take 1 tablet (5 mg total) by mouth every 8 (eight) hours as needed. 90 tablet 0  . Flaxseed, Linseed, (FLAX SEED OIL) 1000 MG CAPS Take 1,000 mg by mouth daily.    Marland Kitchen losartan (COZAAR) 100 MG tablet TAKE 1/2 TABLET BY MOUTH TWICE DAILY 90 tablet 1  . magnesium gluconate (MAGONATE) 500 MG tablet Take 500 mg by mouth 2 (two) times daily.    . Omega-3 Fatty Acids (FISH OIL) 1200 MG CPDR Take by mouth 2 (two) times daily.    . vitamin B-12 (CYANOCOBALAMIN) 1000 MCG tablet Take 1,000 mcg by mouth daily.    Marland Kitchen zinc gluconate 50 MG tablet Take 50 mg by mouth daily.     No current facility-administered medications on file prior to visit.    Current Problems (verified) Patient Active Problem List   Diagnosis Date Noted  . Morbid obesity (BMI 30+) 08/26/2014  . Sinus pause   With blocked PACs, PVCs and sinus arrhythmia 10/10/2013  . Medication management 10/06/2013  . Hyperlipidemia 08/27/2013  . Essential tremor   . Proteinuria   . Prediabetes   . Gout   . Vitamin D deficiency   . Rupture of left biceps tendon  08/09/2012  . Left rotator cuff syndrome 06/11/2012  . Numbness of fingers of both hands 06/11/2012  . Essential hypertension 10/10/2010    Screening Tests Health Maintenance  Topic Date Due  . COLONOSCOPY  03/09/1994  . INFLUENZA VACCINE  03/09/2015  . TETANUS/TDAP  01/29/2024  . ZOSTAVAX  Completed  . PNA vac Low Risk Adult  Completed    Immunization History  Administered Date(s) Administered  . Influenza Split 05/23/2013  . Influenza, High Dose Seasonal PF 05/14/2014  . Pneumococcal Conjugate-13 12/03/2014  . Pneumococcal Polysaccharide-23 06/01/2010  . Td 06/01/2010  . Tdap 01/28/2014  . Zoster 08/08/2004    Preventative care: Last colonoscopy: 2013 EGD: 2016  Prior vaccinations: TD or Tdap: 2011  Influenza: 2014 Pneumococcal: 2011 Prevnar Next visit Shingles/Zostavax: 2006  Allergies Allergies  Allergen Reactions  . Aspirin     Internal bleeding  . Nsaids     Internal bleeding  . Welchol [Colesevelam Hcl] Diarrhea  . Vasotec [Enalaprilat] Rash   Surgical history Past Surgical History  Procedure Laterality Date  . Rt knee     Family history Family History  Problem Relation Age of Onset  . Dementia Mother   . Stroke  Father     Risk Factors: obacco History  Substance Use Topics  . Smoking status: Never Smoker   . Smokeless tobacco: Not on file  . Alcohol Use: 7.0 oz/week    14 drink(s) per week   He does not smoke.  Patient is not a former smoker. Are there smokers in your home (other than you)?  No  Alcohol Current alcohol use: social drinker  Caffeine Current caffeine use: coffee 1 /day  Exercise Current exercise: bicycling and yard work  Nutrition/Diet Current diet: in general, a "healthy" diet    Cardiac risk factors: advanced age (older than 76 for men, 58 for women), dyslipidemia, hypertension, male gender and obesity (BMI >= 30 kg/m2).  Depression Screen (Note: if answer to either of the following is "Yes", a more  complete depression screening is indicated)   Q1: Over the past two weeks, have you felt down, depressed or hopeless? No  Q2: Over the past two weeks, have you felt little interest or pleasure in doing things? No  Have you lost interest or pleasure in daily life? No  Do you often feel hopeless? No  Do you cry easily over simple problems? No  Activities of Daily Living In your present state of health, do you have any difficulty performing the following activities?:  Driving? No Managing money?  No Feeding yourself? No Getting from bed to chair? No Climbing a flight of stairs? No Preparing food and eating?: No Bathing or showering? No Getting dressed: No Getting to the toilet? No Using the toilet:No Moving around from place to place: No In the past year have you fallen or had a near fall?:Yes while carrying things   Are you sexually active?  No  Do you have more than one partner?  No  Vision Difficulties: No  Hearing Difficulties: No Do you often ask people to speak up or repeat themselves? No Do you experience ringing or noises in your ears? No Do you have difficulty understanding soft or whispered voices? No  Cognition  Do you feel that you have a problem with memory?No  Do you often misplace items? No  Do you feel safe at home?  Yes  Advanced directives Does patient have a Health Care Power of Attorney? Yes Does patient have a Living Will? Yes   Objective:   Blood pressure 126/60, pulse 68, temperature 97.9 F (36.6 C), resp. rate 16, height  (1.956 m), weight 217 lb 9.6 oz (98.703 kg). Body mass index is 25.8 kg/(m^2).  General appearance: alert, no distress, WD/WN, male Cognitive Testing  Alert? Yes  Normal Appearance?Yes  Oriented to person? Yes  Place? Yes   Time? Yes  Recall of three objects?  Yes  Can perform simple calculations? Yes  Displays appropriate judgment?Yes  Can read the correct time from a watch face?Yes  HEENT: normocephalic, sclerae  anicteric, TMs pearly, nares patent, no discharge or erythema, pharynx normal Oral cavity: MMM, no lesions Neck: supple, no lymphadenopathy, no thyromegaly, no masses Heart: RRR, + occ PVCs, normal S1, S2, no murmurs Lungs: CTA bilaterally, no wheezes, rhonchi, or rales Abdomen: +bs, soft, non tender, non distended, no masses, no hepatomegaly, no splenomegaly Musculoskeletal: nontender, no swelling, no obvious deformity Extremities: no edema, no cyanosis, no clubbing Pulses: 2+ symmetric, upper and lower extremities, normal cap refill Neurological: alert, oriented x 3, CN2-12 intact, strength normal upper extremities and lower extremities, sensation normal throughout, DTRs 2+ throughout, no cerebellar signs, gait normal Psychiatric: normal affect, behavior normal,  pleasant   Medicare Attestation I have personally reviewed: The patient's medical and social history Their use of alcohol, tobacco or illicit drugs Their current medications and supplements The patient's functional ability including ADLs,fall risks, home safety risks, cognitive, and hearing and visual impairment Diet and physical activities Evidence for depression or mood disorders  The patient's weight, height, BMI, and visual acuity have been recorded in the chart.  I have made referrals, counseling, and provided education to the patient based on review of the above and I have provided the patient with a written personalized care plan for preventive services.     Quentin Mulling, PA-C   01/14/2015

## 2015-01-14 NOTE — Patient Instructions (Signed)
Preventive Care for Adults A healthy lifestyle and preventive care can promote health and wellness. Preventive health guidelines for men include the following key practices:  A routine yearly physical is a good way to check with your health care provider about your health and preventative screening. It is a chance to share any concerns and updates on your health and to receive a thorough exam.  Visit your dentist for a routine exam and preventative care every 6 months. Brush your teeth twice a day and floss once a day. Good oral hygiene prevents tooth decay and gum disease.  The frequency of eye exams is based on your age, health, family medical history, use of contact lenses, and other factors. Follow your health care provider's recommendations for frequency of eye exams.  Eat a healthy diet. Foods such as vegetables, fruits, whole grains, low-fat dairy products, and lean protein foods contain the nutrients you need without too many calories. Decrease your intake of foods high in solid fats, added sugars, and salt. Eat the right amount of calories for you.Get information about a proper diet from your health care provider, if necessary.  Regular physical exercise is one of the most important things you can do for your health. Most adults should get at least 150 minutes of moderate-intensity exercise (any activity that increases your heart rate and causes you to sweat) each week. In addition, most adults need muscle-strengthening exercises on 2 or more days a week.  Maintain a healthy weight. The body mass index (BMI) is a screening tool to identify possible weight problems. It provides an estimate of body fat based on height and weight. Your health care provider can find your BMI and can help you achieve or maintain a healthy weight.For adults 20 years and older:  A BMI below 18.5 is considered underweight.  A BMI of 18.5 to 24.9 is normal.  A BMI of 25 to 29.9 is considered overweight.  A BMI  of 30 and above is considered obese.  Maintain normal blood lipids and cholesterol levels by exercising and minimizing your intake of saturated fat. Eat a balanced diet with plenty of fruit and vegetables. Blood tests for lipids and cholesterol should begin at age 50 and be repeated every 5 years. If your lipid or cholesterol levels are high, you are over 50, or you are at high risk for heart disease, you may need your cholesterol levels checked more frequently.Ongoing high lipid and cholesterol levels should be treated with medicines if diet and exercise are not working.  If you smoke, find out from your health care provider how to quit. If you do not use tobacco, do not start.  Lung cancer screening is recommended for adults aged 73-80 years who are at high risk for developing lung cancer because of a history of smoking. A yearly low-dose CT scan of the lungs is recommended for people who have at least a 30-pack-year history of smoking and are a current smoker or have quit within the past 15 years. A pack year of smoking is smoking an average of 1 pack of cigarettes a day for 1 year (for example: 1 pack a day for 30 years or 2 packs a day for 15 years). Yearly screening should continue until the smoker has stopped smoking for at least 15 years. Yearly screening should be stopped for people who develop a health problem that would prevent them from having lung cancer treatment.  If you choose to drink alcohol, do not have more than  2 drinks per day. One drink is considered to be 12 ounces (355 mL) of beer, 5 ounces (148 mL) of wine, or 1.5 ounces (44 mL) of liquor.  Avoid use of street drugs. Do not share needles with anyone. Ask for help if you need support or instructions about stopping the use of drugs.  High blood pressure causes heart disease and increases the risk of stroke. Your blood pressure should be checked at least every 1-2 years. Ongoing high blood pressure should be treated with  medicines, if weight loss and exercise are not effective.  If you are 45-79 years old, ask your health care provider if you should take aspirin to prevent heart disease.  Diabetes screening involves taking a blood sample to check your fasting blood sugar level. Testing should be considered at a younger age or be carried out more frequently if you are overweight and have at least 1 risk factor for diabetes.  Colorectal cancer can be detected and often prevented. Most routine colorectal cancer screening begins at the age of 50 and continues through age 75. However, your health care provider may recommend screening at an earlier age if you have risk factors for colon cancer. On a yearly basis, your health care provider may provide home test kits to check for hidden blood in the stool. Use of a small camera at the end of a tube to directly examine the colon (sigmoidoscopy or colonoscopy) can detect the earliest forms of colorectal cancer. Talk to your health care provider about this at age 50, when routine screening begins. Direct exam of the colon should be repeated every 5-10 years through age 75, unless early forms of precancerous polyps or small growths are found.  Hepatitis C blood testing is recommended for all people born from 1945 through 1965 and any individual with known risks for hepatitis C.  New guidelines recommend a once time screening for HIV.   Screening for abdominal aortic aneurysm (AAA)  by ultrasound is recommended for people who have history of high blood pressure or who are current or former smokers.  Healthy men should  receive prostate-specific antigen (PSA) blood tests as part of routine cancer screening. Talk with your health care provider about prostate cancer screening.  Testicular cancer screening is  recommended for adult males. Screening includes self-exam, a health care provider exam, and other screening tests. Consult with your health care provider about any symptoms you  have or any concerns you have about testicular cancer.  Use sunscreen. Apply sunscreen liberally and repeatedly throughout the day. You should seek shade when your shadow is shorter than you. Protect yourself by wearing long sleeves, pants, a wide-brimmed hat, and sunglasses year round, whenever you are outdoors.  Once a month, do a whole-body skin exam, using a mirror to look at the skin on your back. Tell your health care provider about new moles, moles that have irregular borders, moles that are larger than a pencil eraser, or moles that have changed in shape or color.  Stay current with required vaccines (immunizations).  Influenza vaccine. All adults should be immunized every year.  Tetanus, diphtheria, and acellular pertussis (Td, Tdap) vaccine. An adult who has not previously received Tdap or who does not know his vaccine status should receive 1 dose of Tdap. This initial dose should be followed by tetanus and diphtheria toxoids (Td) booster doses every 10 years. Adults with an unknown or incomplete history of completing a 3-dose immunization series with Td-containing vaccines should begin or   complete a primary immunization series including a Tdap dose. Adults should receive a Td booster every 10 years.  Zoster vaccine. One dose is recommended for adults aged 60 years or older unless certain conditions are present.    PREVNAR - Pneumococcal 13-valent conjugate (PCV13) vaccine. When indicated, a person who is uncertain of his immunization history and has no record of immunization should receive the PCV13 vaccine. An adult aged 19 years or older who has certain medical conditions and has not been previously immunized should receive 1 dose of PCV13 vaccine. This PCV13 should be followed with a dose of pneumococcal polysaccharide (PPSV23) vaccine. The PPSV23 vaccine dose should be obtained at least 8 weeks after the dose of PCV13 vaccine. An adult aged 19 years or older who has certain medical  conditions and previously received 1 or more doses of PPSV23 vaccine should receive 1 dose of PCV13. The PCV13 vaccine dose should be obtained 1 or more years after the last PPSV23 vaccine dose.    PNEUMOVAX - Pneumococcal polysaccharide (PPSV23) vaccine. When PCV13 is also indicated, PCV13 should be obtained first. All adults aged 65 years and older should be immunized. An adult younger than age 65 years who has certain medical conditions should be immunized. Any person who resides in a nursing home or long-term care facility should be immunized. An adult smoker should be immunized. People with an immunocompromised condition and certain other conditions should receive both PCV13 and PPSV23 vaccines. People with human immunodeficiency virus (HIV) infection should be immunized as soon as possible after diagnosis. Immunization during chemotherapy or radiation therapy should be avoided. Routine use of PPSV23 vaccine is not recommended for American Indians, [place] Natives, or people younger than 65 years unless there are medical conditions that require PPSV23 vaccine. When indicated, people who have unknown immunization and have no record of immunization should receive PPSV23 vaccine. One-time revaccination 5 years after the first dose of PPSV23 is recommended for people aged 19-64 years who have chronic kidney failure, nephrotic syndrome, asplenia, or immunocompromised conditions. People who received 1-2 doses of PPSV23 before age 65 years should receive another dose of PPSV23 vaccine at age 65 years or later if at least 5 years have passed since the previous dose. Doses of PPSV23 are not needed for people immunized with PPSV23 at or after age 65 years.    Hepatitis A vaccine. Adults who wish to be protected from this disease, have certain high-risk conditions, work with hepatitis A-infected animals, work in hepatitis A research labs, or travel to or work in countries with a high rate of hepatitis A should be  immunized. Adults who were previously unvaccinated and who anticipate close contact with an international adoptee during the first 60 days after arrival in the [place] from a country with a high rate of hepatitis A should be immunized.    Hepatitis B vaccine. Adults should be immunized if they wish to be protected from this disease, have certain high-risk conditions, may be exposed to blood or other infectious body fluids, are household contacts or sex partners of hepatitis B positive people, are clients or workers in certain care facilities, or travel to or work in countries with a high rate of hepatitis B.   Preventive Service / Frequency   Ages 65 and over  Blood pressure check.  Lipid and cholesterol check.  Lung cancer screening. / Every year if you are aged 55-80 years and have a 30-pack-year history of smoking and currently smoke or have quit   within the past 15 years. Yearly screening is stopped once you have quit smoking for at least 15 years or develop a health problem that would prevent you from having lung cancer treatment.  Fecal occult blood test (FOBT) of stool. You may not have to do this test if you get a colonoscopy every 10 years.  Flexible sigmoidoscopy** or colonoscopy.** / Every 5 years for a flexible sigmoidoscopy or every 10 years for a colonoscopy beginning at age 74 and continuing until age 12.  Hepatitis C blood test.** / For all people born from [birth year] through 1965 and any individual with known risks for hepatitis C.  Abdominal aortic aneurysm (AAA) screening./ Screening current or former smokers or have Hypertension.  Skin self-exam. / Monthly.  Influenza vaccine. / Every year.  Tetanus, diphtheria, and acellular pertussis (Tdap/Td) vaccine.** / 1 dose of Td every 10 years.   Zoster vaccine.** / 1 dose for adults aged 23 years or older.         Pneumococcal 13-valent conjugate (PCV13) vaccine.    Pneumococcal polysaccharide (PPSV23) vaccine.      Hepatitis A vaccine.** / Consult your health care provider.  Hepatitis B vaccine.** / Consult your health care provider. Screening for abdominal aortic aneurysm (AAA)  by ultrasound is recommended for people who have history of high blood pressure or who are current or former smokers.    Peptic Ulcer A peptic ulcer is a sore in the lining of your esophagus (esophageal ulcer), stomach (gastric ulcer), or in the first part of your small intestine (duodenal ulcer). The ulcer causes erosion into the deeper tissue. CAUSES  Normally, the lining of the stomach and the small intestine protects itself from the acid that digests food. The protective lining can be damaged by:  An infection caused by a bacterium called Helicobacter pylori (H. pylori).  Regular use of nonsteroidal anti-inflammatory drugs (NSAIDs), such as ibuprofen or aspirin.  Smoking tobacco. Other risk factors include being older than 50, drinking alcohol excessively, and having a family history of ulcer disease.  SYMPTOMS   Burning pain or gnawing in the area between the chest and the belly button.  Heartburn.  Nausea and vomiting.  Bloating. The pain can be worse on an empty stomach and at night. If the ulcer results in bleeding, it can cause:  Black, tarry stools.  Vomiting of bright red blood.  Vomiting of coffee-ground-looking materials. DIAGNOSIS  A diagnosis is usually made based upon your history and an exam. Other tests and procedures may be performed to find the cause of the ulcer. Finding a cause will help determine the best treatment. Tests and procedures may include:  Blood tests, stool tests, or breath tests to check for the bacterium H. pylori.  An upper gastrointestinal (GI) series of the esophagus, stomach, and small intestine.  An endoscopy to examine the esophagus, stomach, and small intestine.  A biopsy. TREATMENT  Treatment may include:  Eliminating the cause of the ulcer, such as  smoking, NSAIDs, or alcohol.  Medicines to reduce the amount of acid in your digestive tract.  Antibiotic medicines if the ulcer is caused by the H. pylori bacterium.  An upper endoscopy to treat a bleeding ulcer.  Surgery if the bleeding is severe or if the ulcer created a hole somewhere in the digestive system. HOME CARE INSTRUCTIONS   Avoid tobacco, alcohol, and caffeine. Smoking can increase the acid in the stomach, and continued smoking will impair the healing of ulcers.  Avoid foods and drinks  that seem to cause discomfort or aggravate your ulcer.  Only take medicines as directed by your caregiver. Do not substitute over-the-counter medicines for prescription medicines without talking to your caregiver.  Keep any follow-up appointments and tests as directed. SEEK MEDICAL CARE IF:   Your do not improve within 7 days of starting treatment.  You have ongoing indigestion or heartburn. SEEK IMMEDIATE MEDICAL CARE IF:   You have sudden, sharp, or persistent abdominal pain.  You have bloody or dark black, tarry stools.  You vomit blood or vomit that looks like coffee grounds.  You become light-headed, weak, or feel faint.  You become sweaty or clammy. MAKE SURE YOU:   Understand these instructions.  Will watch your condition.  Will get help right away if you are not doing well or get worse. Document Released: 07/22/2000 Document Revised: 12/09/2013 Document Reviewed: 02/22/2012 [facility] Patient Information 2015 [employer], [place]. This information is not intended to replace advice given to you by your health care provider. Make sure you discuss any questions you have with your health care provider.

## 2015-01-15 ENCOUNTER — Other Ambulatory Visit: Payer: Self-pay | Admitting: Physician Assistant

## 2015-01-15 LAB — CBC WITH DIFFERENTIAL/PLATELET
Basophils Absolute: 0 10*3/uL (ref 0.0–0.1)
Basophils Relative: 1 % (ref 0–1)
Eosinophils Absolute: 0.1 10*3/uL (ref 0.0–0.7)
Eosinophils Relative: 3 % (ref 0–5)
HCT: 33.6 % — ABNORMAL LOW (ref 39.0–52.0)
Hemoglobin: 11.1 g/dL — ABNORMAL LOW (ref 13.0–17.0)
Lymphocytes Relative: 40 % (ref 12–46)
Lymphs Abs: 1.6 10*3/uL (ref 0.7–4.0)
MCH: 33.3 pg (ref 26.0–34.0)
MCHC: 33 g/dL (ref 30.0–36.0)
MCV: 100.9 fL — ABNORMAL HIGH (ref 78.0–100.0)
MPV: 9.5 fL (ref 8.6–12.4)
Monocytes Absolute: 0.5 10*3/uL (ref 0.1–1.0)
Monocytes Relative: 12 % (ref 3–12)
Neutro Abs: 1.7 10*3/uL (ref 1.7–7.7)
Neutrophils Relative %: 44 % (ref 43–77)
Platelets: 198 10*3/uL (ref 150–400)
RBC: 3.33 MIL/uL — ABNORMAL LOW (ref 4.22–5.81)
RDW: 14.9 % (ref 11.5–15.5)
WBC: 3.9 10*3/uL — ABNORMAL LOW (ref 4.0–10.5)

## 2015-01-15 LAB — FERRITIN: Ferritin: 132 ng/mL (ref 22–322)

## 2015-01-15 LAB — BASIC METABOLIC PANEL WITH GFR
BUN: 17 mg/dL (ref 6–23)
CO2: 27 mEq/L (ref 19–32)
Calcium: 9.5 mg/dL (ref 8.4–10.5)
Chloride: 104 mEq/L (ref 96–112)
Creat: 1.03 mg/dL (ref 0.50–1.35)
GFR, Est African American: 85 mL/min
GFR, Est Non African American: 73 mL/min
Glucose, Bld: 75 mg/dL (ref 70–99)
Potassium: 5.2 mEq/L (ref 3.5–5.3)
Sodium: 140 mEq/L (ref 135–145)

## 2015-01-15 LAB — FOLATE RBC: RBC Folate: 775 ng/mL (ref >280)

## 2015-01-15 LAB — IRON AND TIBC
%SAT: 32 % (ref 20–55)
Iron: 76 ug/dL (ref 42–165)
TIBC: 237 ug/dL (ref 215–435)
UIBC: 161 ug/dL (ref 125–400)

## 2015-01-18 NOTE — Progress Notes (Signed)
Rescheduled

## 2015-02-09 ENCOUNTER — Encounter: Payer: Self-pay | Admitting: *Deleted

## 2015-03-05 DIAGNOSIS — L639 Alopecia areata, unspecified: Secondary | ICD-10-CM | POA: Diagnosis not present

## 2015-03-08 ENCOUNTER — Other Ambulatory Visit: Payer: Self-pay | Admitting: Physician Assistant

## 2015-03-09 NOTE — Telephone Encounter (Signed)
Called Rx into [facility].  

## 2015-03-10 ENCOUNTER — Encounter: Payer: Self-pay | Admitting: Internal Medicine

## 2015-03-10 ENCOUNTER — Ambulatory Visit (INDEPENDENT_AMBULATORY_CARE_PROVIDER_SITE_OTHER): Payer: Medicare Other | Admitting: Internal Medicine

## 2015-03-10 VITALS — BP 126/70 | HR 76 | Temp 98.6°F | Resp 16 | Ht 72.5 in | Wt 221.0 lb

## 2015-03-10 DIAGNOSIS — R7309 Other abnormal glucose: Secondary | ICD-10-CM

## 2015-03-10 DIAGNOSIS — E782 Mixed hyperlipidemia: Secondary | ICD-10-CM

## 2015-03-10 DIAGNOSIS — E559 Vitamin D deficiency, unspecified: Secondary | ICD-10-CM | POA: Diagnosis not present

## 2015-03-10 DIAGNOSIS — R739 Hyperglycemia, unspecified: Secondary | ICD-10-CM

## 2015-03-10 DIAGNOSIS — R7303 Prediabetes: Secondary | ICD-10-CM

## 2015-03-10 DIAGNOSIS — Z79899 Other long term (current) drug therapy: Secondary | ICD-10-CM | POA: Diagnosis not present

## 2015-03-10 DIAGNOSIS — I10 Essential (primary) hypertension: Secondary | ICD-10-CM

## 2015-03-10 LAB — BASIC METABOLIC PANEL WITH GFR
BUN: 16 mg/dL (ref 7–25)
CO2: 25 mmol/L (ref 20–31)
Calcium: 10 mg/dL (ref 8.6–10.3)
Chloride: 104 mmol/L (ref 98–110)
Creat: 0.95 mg/dL (ref 0.70–1.18)
GFR, Est African American: >89 mL/min (ref >=60)
GFR, Est Non African American: 80 mL/min (ref >=60)
Glucose, Bld: 78 mg/dL (ref 65–99)
Potassium: 4.4 mmol/L (ref 3.5–5.3)
Sodium: 142 mmol/L (ref 135–146)

## 2015-03-10 LAB — LIPID PANEL
Cholesterol: 171 mg/dL (ref 125–200)
HDL: 71 mg/dL (ref >=40)
LDL Cholesterol: 83 mg/dL (ref <130)
Total CHOL/HDL Ratio: 2.4 Ratio (ref <=5.0)
Triglycerides: 87 mg/dL (ref <150)
VLDL: 17 mg/dL (ref <30)

## 2015-03-10 LAB — CBC WITH DIFFERENTIAL/PLATELET
Basophils Absolute: 0 10*3/uL (ref 0.0–0.1)
Basophils Relative: 1 % (ref 0–1)
Eosinophils Absolute: 0.1 10*3/uL (ref 0.0–0.7)
Eosinophils Relative: 2 % (ref 0–5)
HCT: 43.2 % (ref 39.0–52.0)
Hemoglobin: 14.9 g/dL (ref 13.0–17.0)
Lymphocytes Relative: 37 % (ref 12–46)
Lymphs Abs: 1.3 10*3/uL (ref 0.7–4.0)
MCH: 33.5 pg (ref 26.0–34.0)
MCHC: 34.5 g/dL (ref 30.0–36.0)
MCV: 97.1 fL (ref 78.0–100.0)
MPV: 9.5 fL (ref 8.6–12.4)
Monocytes Absolute: 0.4 10*3/uL (ref 0.1–1.0)
Monocytes Relative: 11 % (ref 3–12)
Neutro Abs: 1.7 10*3/uL (ref 1.7–7.7)
Neutrophils Relative %: 49 % (ref 43–77)
Platelets: 134 10*3/uL — ABNORMAL LOW (ref 150–400)
RBC: 4.45 MIL/uL (ref 4.22–5.81)
RDW: 13.8 % (ref 11.5–15.5)
WBC: 3.5 10*3/uL — ABNORMAL LOW (ref 4.0–10.5)

## 2015-03-10 LAB — HEPATIC FUNCTION PANEL
ALT: 23 U/L (ref 9–46)
AST: 30 U/L (ref 10–35)
Albumin: 4 g/dL (ref 3.6–5.1)
Alkaline Phosphatase: 48 U/L (ref 40–115)
Bilirubin, Direct: 0.1 mg/dL (ref <=0.2)
Indirect Bilirubin: 0.4 mg/dL (ref 0.2–1.2)
Total Bilirubin: 0.5 mg/dL (ref 0.2–1.2)
Total Protein: 6.6 g/dL (ref 6.1–8.1)

## 2015-03-10 LAB — HEMOGLOBIN A1C
Hgb A1c MFr Bld: 5.2 % (ref <5.7)
Mean Plasma Glucose: 103 mg/dL (ref <117)

## 2015-03-10 LAB — MAGNESIUM: Magnesium: 1.4 mg/dL — ABNORMAL LOW (ref 1.5–2.5)

## 2015-03-10 LAB — TSH: TSH: 2.067 u[IU]/mL (ref 0.350–4.500)

## 2015-03-10 NOTE — Progress Notes (Signed)
Patient ID: Leroy Griffin, male   DOB: 11-Nov-1945, 69 y.o.   MRN: [id number]  Assessment and Plan:  Hypertension:  -Continue medication,  -monitor blood pressure at home.  -Continue DASH diet.   -Reminder to go to the ER if any CP, SOB, nausea, dizziness, severe HA, changes vision/speech, left arm numbness and tingling, and jaw pain.  Cholesterol: -Continue diet and exercise.  -Check cholesterol.   Pre-diabetes: -Continue diet and exercise.  -Check A1C  Vitamin D Def: -check level -continue medications.   Continue diet and meds as discussed. Further disposition pending results of labs.  HPI 69 y.o. male  presents for 3 month follow up with hypertension, hyperlipidemia, prediabetes and vitamin D.   His blood pressure has been controlled at home, today their BP is BP: 126/70 mmHg.   He does workout. He denies chest pain, shortness of breath, dizziness.   He is on cholesterol medication and denies myalgias. His cholesterol is not at goal. The cholesterol last visit was:   Lab Results  Component Value Date   CHOL 218* 12/03/2014   HDL 59 12/03/2014   LDLCALC 118* 12/03/2014   TRIG 205* 12/03/2014   CHOLHDL 3.7 12/03/2014     He has been working on diet and exercise for prediabetes, and denies foot ulcerations, hyperglycemia, hypoglycemia , increased appetite, nausea, paresthesia of the feet, polydipsia, polyuria, visual disturbances, vomiting and weight loss. Last A1C in the office was:  Lab Results  Component Value Date   HGBA1C 5.5 12/03/2014  He reports that he is usually doing meats and he is doing a lot of salads.  He is working on cutting portion sizes and avoiding white starches.    Patient is on Vitamin D supplement.  Lab Results  Component Value Date   VD25OH 46 12/03/2014     Patient reports that his back has been bothering him.  He reports that he was recommended to have lumbar fusion.  He feels that his back has been doing well.  He only takes tylenol 3 as  needed for when his back is really bothering him.  Current Medications:  Current Outpatient Prescriptions on File Prior to Visit  Medication Sig Dispense Refill  . allopurinol (ZYLOPRIM) 300 MG tablet TAKE 1/2 TABLET BY MOUTH DAILY FOR GOUT 45 tablet 3  . atorvastatin (LIPITOR) 20 MG tablet TAKE 1/2 TO 1 TABLET BY MOUTH EVERY DAY FOR CHOLESTEROL 30 tablet 5  . Cholecalciferol (VITAMIN D PO) Take 4,000 Units by mouth daily.     . diazepam (VALIUM) 5 MG tablet TAKE 1 TABLET BY MOUTH EVERY 8 HOURS AS NEEDED 90 tablet 0  . Flaxseed, Linseed, (FLAX SEED OIL) 1000 MG CAPS Take 1,000 mg by mouth daily.    Marland Kitchen losartan (COZAAR) 100 MG tablet TAKE 1/2 TABLET BY MOUTH TWICE DAILY 90 tablet 1  . magnesium gluconate (MAGONATE) 500 MG tablet Take 500 mg by mouth 2 (two) times daily.    . Omega-3 Fatty Acids (FISH OIL) 1200 MG CPDR Take by mouth at bedtime.     . vitamin B-12 (CYANOCOBALAMIN) 1000 MCG tablet Take 1,000 mcg by mouth daily.    Marland Kitchen zinc gluconate 50 MG tablet Take 50 mg by mouth daily.     No current facility-administered medications on file prior to visit.    Medical History:  Past Medical History  Diagnosis Date  . Hypertension   . Hyperlipidemia   . Prediabetes   . Gout   . Vitamin D deficiency   .  Essential tremor   . Proteinuria     Allergies:  Allergies  Allergen Reactions  . Aspirin     Internal bleeding  . Nsaids     Internal bleeding  . Welchol [Colesevelam Hcl] Diarrhea  . Vasotec [Enalaprilat] Rash     Review of Systems:  Review of Systems  Constitutional: Negative for fever, chills and malaise/fatigue.  HENT: Negative for congestion, ear pain and sore throat.   Eyes: Negative.   Respiratory: Negative for cough, shortness of breath and wheezing.   Cardiovascular: Negative for chest pain, palpitations, claudication and leg swelling.  Gastrointestinal: Negative for heartburn, diarrhea, constipation, blood in stool and melena.  Genitourinary: Negative.   Skin:  Negative.   Neurological: Negative for dizziness, sensory change, loss of consciousness and headaches.  Psychiatric/Behavioral: Negative for depression. The patient is not nervous/anxious and does not have insomnia.     Family history- Review and unchanged  Social history- Review and unchanged  Physical Exam: BP 126/70 mmHg  Pulse 76  Temp(Src) 98.6 F (37 C) (Temporal)  Resp 16  Ht 6' 0.5" (1.842 m)  Wt 221 lb (100.245 kg)  BMI 29.54 kg/m2 Wt Readings from Last 3 Encounters:  03/10/15 221 lb (100.245 kg)  01/14/15 217 lb 9.6 oz (98.703 kg)  12/03/14 222 lb 12.8 oz (101.061 kg)    General Appearance: Well nourished well developed, in no apparent distress. Eyes: PERRLA, EOMs, conjunctiva no swelling or erythema ENT/Mouth: Ear canals normal without obstruction, swelling, erythma, discharge.  TMs normal bilaterally.  Oropharynx moist, clear, without exudate, or postoropharyngeal swelling. Neck: Supple, thyroid normal,no cervical adenopathy  Respiratory: Respiratory effort normal, Breath sounds clear A&P without rhonchi, wheeze, or rale.  No retractions, no accessory usage. Cardio: Reg irregularly with no MRGs. Brisk peripheral pulses without edema.  Abdomen: Soft, + BS,  Non tender, no guarding, rebound, hernias, masses. Musculoskeletal: Full ROM, 5/5 strength, Normal gait Skin: Warm, dry without rashes, lesions, ecchymosis.  Neuro: Awake and oriented X 3, Cranial nerves intact. Normal muscle tone, no cerebellar symptoms. Psych: Normal affect, Insight and Judgment appropriate.    Terri Piedra, PA-C 11:05 AM [facility]

## 2015-03-11 LAB — VITAMIN D 25 HYDROXY (VIT D DEFICIENCY, FRACTURES): Vit D, 25-Hydroxy: 76 ng/mL (ref 30–100)

## 2015-03-11 LAB — INSULIN, RANDOM: Insulin: 8.2 u[IU]/mL (ref 2.0–19.6)

## 2015-04-07 DIAGNOSIS — L639 Alopecia areata, unspecified: Secondary | ICD-10-CM | POA: Diagnosis not present

## 2015-04-12 DIAGNOSIS — T6091XA Toxic effect of unspecified pesticide, accidental (unintentional), initial encounter: Secondary | ICD-10-CM | POA: Diagnosis not present

## 2015-04-12 DIAGNOSIS — T7840XA Allergy, unspecified, initial encounter: Secondary | ICD-10-CM | POA: Diagnosis not present

## 2015-04-12 DIAGNOSIS — L509 Urticaria, unspecified: Secondary | ICD-10-CM | POA: Diagnosis not present

## 2015-05-07 ENCOUNTER — Other Ambulatory Visit: Payer: Self-pay | Admitting: Internal Medicine

## 2015-05-20 ENCOUNTER — Other Ambulatory Visit: Payer: Self-pay | Admitting: Internal Medicine

## 2015-05-20 DIAGNOSIS — L639 Alopecia areata, unspecified: Secondary | ICD-10-CM | POA: Diagnosis not present

## 2015-06-11 ENCOUNTER — Ambulatory Visit (INDEPENDENT_AMBULATORY_CARE_PROVIDER_SITE_OTHER): Payer: Medicare Other | Admitting: Internal Medicine

## 2015-06-11 ENCOUNTER — Encounter: Payer: Self-pay | Admitting: Internal Medicine

## 2015-06-11 VITALS — BP 140/76 | HR 80 | Temp 97.0°F | Resp 16 | Ht 72.5 in | Wt 220.0 lb

## 2015-06-11 DIAGNOSIS — Z6829 Body mass index (BMI) 29.0-29.9, adult: Secondary | ICD-10-CM

## 2015-06-11 DIAGNOSIS — E559 Vitamin D deficiency, unspecified: Secondary | ICD-10-CM | POA: Diagnosis not present

## 2015-06-11 DIAGNOSIS — R7309 Other abnormal glucose: Secondary | ICD-10-CM | POA: Diagnosis not present

## 2015-06-11 DIAGNOSIS — Z23 Encounter for immunization: Secondary | ICD-10-CM

## 2015-06-11 DIAGNOSIS — E782 Mixed hyperlipidemia: Secondary | ICD-10-CM

## 2015-06-11 DIAGNOSIS — R7303 Prediabetes: Secondary | ICD-10-CM

## 2015-06-11 DIAGNOSIS — I10 Essential (primary) hypertension: Secondary | ICD-10-CM | POA: Diagnosis not present

## 2015-06-11 DIAGNOSIS — I209 Angina pectoris, unspecified: Secondary | ICD-10-CM

## 2015-06-11 DIAGNOSIS — Z79899 Other long term (current) drug therapy: Secondary | ICD-10-CM

## 2015-06-11 DIAGNOSIS — M109 Gout, unspecified: Secondary | ICD-10-CM | POA: Diagnosis not present

## 2015-06-11 DIAGNOSIS — M1000 Idiopathic gout, unspecified site: Secondary | ICD-10-CM | POA: Diagnosis not present

## 2015-06-11 LAB — CBC WITH DIFFERENTIAL/PLATELET
Basophils Absolute: 0 10*3/uL (ref 0.0–0.1)
Basophils Relative: 1 % (ref 0–1)
Eosinophils Absolute: 0.1 10*3/uL (ref 0.0–0.7)
Eosinophils Relative: 3 % (ref 0–5)
HCT: 48.5 % (ref 39.0–52.0)
Hemoglobin: 16.2 g/dL (ref 13.0–17.0)
Lymphocytes Relative: 37 % (ref 12–46)
Lymphs Abs: 1.6 10*3/uL (ref 0.7–4.0)
MCH: 32.3 pg (ref 26.0–34.0)
MCHC: 33.4 g/dL (ref 30.0–36.0)
MCV: 96.6 fL (ref 78.0–100.0)
MPV: 9.3 fL (ref 8.6–12.4)
Monocytes Absolute: 0.4 10*3/uL (ref 0.1–1.0)
Monocytes Relative: 10 % (ref 3–12)
Neutro Abs: 2.1 10*3/uL (ref 1.7–7.7)
Neutrophils Relative %: 49 % (ref 43–77)
Platelets: 146 10*3/uL — ABNORMAL LOW (ref 150–400)
RBC: 5.02 MIL/uL (ref 4.22–5.81)
RDW: 15.5 % (ref 11.5–15.5)
WBC: 4.3 10*3/uL (ref 4.0–10.5)

## 2015-06-11 LAB — BASIC METABOLIC PANEL WITH GFR
BUN: 19 mg/dL (ref 7–25)
CO2: 30 mmol/L (ref 20–31)
Calcium: 10 mg/dL (ref 8.6–10.3)
Chloride: 101 mmol/L (ref 98–110)
Creat: 0.95 mg/dL (ref 0.70–1.18)
GFR, Est African American: >89 mL/min (ref >=60)
GFR, Est Non African American: 80 mL/min (ref >=60)
Glucose, Bld: 92 mg/dL (ref 65–99)
Potassium: 4.6 mmol/L (ref 3.5–5.3)
Sodium: 140 mmol/L (ref 135–146)

## 2015-06-11 LAB — HEPATIC FUNCTION PANEL
ALT: 21 U/L (ref 9–46)
AST: 26 U/L (ref 10–35)
Albumin: 4 g/dL (ref 3.6–5.1)
Alkaline Phosphatase: 52 U/L (ref 40–115)
Bilirubin, Direct: 0.2 mg/dL (ref <=0.2)
Indirect Bilirubin: 0.7 mg/dL (ref 0.2–1.2)
Total Bilirubin: 0.9 mg/dL (ref 0.2–1.2)
Total Protein: 6.7 g/dL (ref 6.1–8.1)

## 2015-06-11 LAB — LIPID PANEL
Cholesterol: 183 mg/dL (ref 125–200)
HDL: 75 mg/dL (ref >=40)
LDL Cholesterol: 89 mg/dL (ref <130)
Total CHOL/HDL Ratio: 2.4 Ratio (ref <=5.0)
Triglycerides: 93 mg/dL (ref <150)
VLDL: 19 mg/dL (ref <30)

## 2015-06-11 LAB — HEMOGLOBIN A1C
Hgb A1c MFr Bld: 5.5 % (ref <5.7)
Mean Plasma Glucose: 111 mg/dL (ref <117)

## 2015-06-11 LAB — URIC ACID: Uric Acid, Serum: 3.7 mg/dL — ABNORMAL LOW (ref 4.0–7.8)

## 2015-06-11 LAB — MAGNESIUM: Magnesium: 1.7 mg/dL (ref 1.5–2.5)

## 2015-06-11 LAB — TSH: TSH: 1.946 u[IU]/mL (ref 0.350–4.500)

## 2015-06-11 NOTE — Patient Instructions (Signed)

## 2015-06-11 NOTE — Progress Notes (Signed)
Patient ID: Leroy Griffin, male   DOB: 08/19/1945, 69 y.o.   MRN: [id number]     This very nice 69 y.o. MWM presents for 6 month follow up with Hypertension, Hyperlipidemia, Pre-Diabetes, Gout  and Vitamin D Deficiency.      Patient is treated for HTN & BP has been controlled at home. Today's BP: 140/76 mmHg. Today patient mentions he's had some DOE walking a flight of stairs for the last 1-2 months. Very vague as to whether he actually has a discomfort vs the dyspnea, but it is usu promptly relieved with rest. Has seen Dr Graciela HusbandsKlein in the very remote past for a neg nuclear stress test and last year for a ? Abn EKG felt OK by Dr Graciela HusbandsKlein. Patient has had no complaints of  palpitations, orthopnea/PND, dizziness, claudication, or dependent edema.  Also, patient has been followed by Dr Caryn SectionFox for benign proteinuria.      Hyperlipidemia is controlled with diet & meds. Patient denies myalgias or other med SE's. Last Lipids were at goal with Cholesterol 171; HDL 71; LDL 83; and Triglycerides 87 on 03/10/2015.     Also, the patient has history of Morbid Obesity (BMI 30+) and is screened proactively & expectantly for PreDiabetes and has had no symptoms of reactive hypoglycemia, diabetic polys, paresthesias or visual blurring.  His last A1c was 5.2% on 03/10/2015.     In May 2016, patient  was admitted overnight for UGI bleed attributed to a challenge & retrial on bASA 81 mg as confirmed by EGD. He was treated for a period of time with Protonix. Patient also has gout which is asymptomatic on allopurinol. Further, the patient also has history of Vitamin D Deficiency of "11" in 2009 and he supplements vitamin D without any suspected side-effects. Last vitamin D was 76 in 03/10/2015.  Medication Sig  . TYLENOL #3 TAKE 1 TABLET BY MOUTH FOUR TIMES DAILY  . allopurinol  300 MG tablet TAKE 1/2 TABLET BY MOUTH DAILY FOR GOUT  . atorvastatin  20 MG tablet TAKE 1/2 TO 1 TABLET BY MOUTH EVERY DAY FOR CHOLESTEROL  . VITAMIN D Take 8,000  Units by mouth daily.   . diazepam  5 MG tablet TAKE 1 TABLET BY MOUTH EVERY 8 HOURS AS NEEDED  . FLAX SEED OIL 1000 MG Take 1,000 mg by mouth daily.  Marland Kitchen. losartan100 MG tablet TAKE 1/2 TABLET BY MOUTH TWICE DAILY  . magnesium 500 MG  Take 500 mg by mouth 2 (two) times daily.  Marland Kitchen. FISH OIL 1200 MG Take by mouth at bedtime.   . vitamin B-12  1000 MCG tab Take 1,000 mcg by mouth daily.  Marland Kitchen. zinc 50 MG tablet Take 50 mg by mouth daily.   Allergies  Allergen Reactions  . Aspirin     Internal bleeding  . Nsaids     Internal bleeding  . Welchol [Colesevelam Hcl] Diarrhea  . Vasotec [Enalaprilat] Rash   PMHx:   Past Medical History  Diagnosis Date  . Hypertension   . Hyperlipidemia   . Prediabetes   . Gout   . Vitamin D deficiency   . Essential tremor   . Proteinuria    Immunization History  Administered Date(s) Administered  . Influenza Split 05/23/2013  . Influenza, High Dose Seasonal PF 05/14/2014  . Pneumococcal Conjugate-13 12/03/2014  . Pneumococcal Polysaccharide-23 06/01/2010  . Td 06/01/2010  . Tdap 01/28/2014  . Zoster 08/08/2004   Past Surgical History  Procedure Laterality Date  . Rt  knee     FHx:    Reviewed / unchanged  SHx:    Reviewed / unchanged  Systems Review:  Constitutional: Denies fever, chills, wt changes, headaches, insomnia, fatigue, night sweats, change in appetite. Eyes: Denies redness, blurred vision, diplopia, discharge, itchy, watery eyes.  ENT: Denies discharge, congestion, post nasal drip, epistaxis, sore throat, earache, hearing loss, dental pain, tinnitus, vertigo, sinus pain, snoring.  CV: Denies chest pain, palpitations, irregular heartbeat, syncope, dyspnea, diaphoresis, orthopnea, PND, claudication or edema. Respiratory: denies cough, dyspnea, DOE, pleurisy, hoarseness, laryngitis, wheezing.  Gastrointestinal: Denies dysphagia, odynophagia, heartburn, reflux, water brash, abdominal pain or cramps, nausea, vomiting, bloating, diarrhea,  constipation, hematemesis, melena, hematochezia  or hemorrhoids. Genitourinary: Denies dysuria, frequency, urgency, nocturia, hesitancy, discharge, hematuria or flank pain. Musculoskeletal: Denies arthralgias, myalgias, stiffness, jt. swelling, pain, limping or strain/sprain.  Skin: Denies pruritus, rash, hives, warts, acne, eczema or change in skin lesion(s). Neuro: No weakness, tremor, incoordination, spasms, paresthesia or pain. Psychiatric: Denies confusion, memory loss or sensory loss. Endo: Denies change in weight, skin or hair change.  Heme/Lymph: No excessive bleeding, bruising or enlarged lymph nodes.  Physical Exam  BP 140/76 mmHg  Pulse 80  Temp(Src) 97 F (36.1 C)  Resp 16  Ht 6' 0.5" (1.842 m)  Wt 220 lb (99.791 kg)  BMI 29.41 kg/m2  Appears well nourished and in no distress. Eyes: PERRLA, EOMs, conjunctiva no swelling or erythema. Sinuses: No frontal/maxillary tenderness ENT/Mouth: EAC's clear, TM's nl w/o erythema, bulging. Nares clear w/o erythema, swelling, exudates. Oropharynx clear without erythema or exudates. Oral hygiene is good. Tongue normal, non obstructing. Hearing intact.  Neck: Supple. Thyroid nl. Car 2+/2+ without bruits, nodes or JVD. Chest: Respirations nl with BS clear & equal w/o rales, rhonchi, wheezing or stridor.  Cor: Heart sounds normal w/ regular rate and rhythm without sig. murmurs, gallops, clicks, or rubs. Peripheral pulses normal and equal  without edema.  Abdomen: Soft & bowel sounds normal. Non-tender w/o guarding, rebound, hernias, masses, or organomegaly.  Lymphatics: Unremarkable.  Musculoskeletal: Full ROM all peripheral extremities, joint stability, 5/5 strength, and normal gait.  Skin: Warm, dry without exposed rashes, lesions or ecchymosis apparent.  Neuro: Cranial nerves intact, reflexes equal bilaterally. Sensory-motor testing grossly intact. Tendon reflexes grossly intact.  Pysch: Alert & oriented x 3.  Insight and judgement nl &  appropriate. No ideations.  Assessment and Plan:  1. Essential hypertension  - TSH  2. Hyperlipidemia  - Lipid panel  3. Prediabetes  - Hemoglobin A1c - Insulin, random  4. Vitamin D deficiency  - Vit D  25 hydroxy   5. Idiopathic gout  - Uric acid  6. Medication management  - CBC with Differential/Platelet - BASIC METABOLIC PANEL WITH GFR - Hepatic function panel - Magnesium  7. Other abnormal glucose  - Hemoglobin A1c - Insulin, random  8. BMI 29.0-29.9,adult  9. Query CP/exertional Dyspnea  -  Refer back to Dr Clide Cliff for consideration for Provacative Cardiac Testing   Recommended regular exercise, BP monitoring, weight control, and discussed med and SE's. Recommended labs to assess and monitor clinical status. Further disposition pending results of labs. Over 30 minutes of exam, counseling, chart review was performed

## 2015-06-12 LAB — INSULIN, RANDOM: Insulin: 13 u[IU]/mL (ref 2.0–19.6)

## 2015-06-12 LAB — VITAMIN D 25 HYDROXY (VIT D DEFICIENCY, FRACTURES): Vit D, 25-Hydroxy: 70 ng/mL (ref 30–100)

## 2015-06-26 ENCOUNTER — Ambulatory Visit: Payer: Medicare Other | Admitting: Internal Medicine

## 2015-06-26 ENCOUNTER — Encounter: Payer: Self-pay | Admitting: Cardiology

## 2015-06-26 ENCOUNTER — Ambulatory Visit (INDEPENDENT_AMBULATORY_CARE_PROVIDER_SITE_OTHER): Payer: Medicare Other | Admitting: Cardiology

## 2015-06-26 VITALS — BP 120/78 | HR 77 | Ht 72.0 in | Wt 219.0 lb

## 2015-06-26 DIAGNOSIS — R079 Chest pain, unspecified: Secondary | ICD-10-CM

## 2015-06-26 DIAGNOSIS — R0609 Other forms of dyspnea: Secondary | ICD-10-CM | POA: Diagnosis not present

## 2015-06-26 DIAGNOSIS — E785 Hyperlipidemia, unspecified: Secondary | ICD-10-CM | POA: Diagnosis not present

## 2015-06-26 DIAGNOSIS — I10 Essential (primary) hypertension: Secondary | ICD-10-CM

## 2015-06-26 DIAGNOSIS — R0600 Dyspnea, unspecified: Secondary | ICD-10-CM

## 2015-06-26 DIAGNOSIS — I498 Other specified cardiac arrhythmias: Secondary | ICD-10-CM

## 2015-06-26 NOTE — Patient Instructions (Addendum)
Medication Instructions:  Your physician recommends that you continue on your current medications as directed. Please refer to the Current Medication list given to you today.   Labwork: None ordered  Testing/Procedures: Your physician has requested that you have a lexiscan myoview. For further information please visit [email or web address]. Please follow instruction sheet, as given.    Follow-Up: Your physician recommends that you schedule a follow-up appointment in:  6 WEEKS WITH DR. Graciela HusbandsKLEIN   Any Other Special Instructions Will Be Listed Below (If Applicable).  Pharmacologic Stress Electrocardiogram A pharmacologic stress electrocardiogram is a heart (cardiac) test that uses nuclear imaging to evaluate the blood supply to your heart. This test may also be called a pharmacologic stress electrocardiography. Pharmacologic means that a medicine is used to increase your heart rate and blood pressure.  This stress test is done to find areas of poor blood flow to the heart by determining the extent of coronary artery disease (CAD). Some people exercise on a treadmill, which naturally increases the blood flow to the heart. For those people unable to exercise on a treadmill, a medicine is used. This medicine stimulates your heart and will cause your heart to beat harder and more quickly, as if you were exercising.  Pharmacologic stress tests can help determine:  The adequacy of blood flow to your heart during increased levels of activity in order to clear you for discharge home.  The extent of coronary artery blockage caused by CAD.  Your prognosis if you have suffered a heart attack.  The effectiveness of cardiac procedures done, such as an angioplasty, which can increase the circulation in your coronary arteries.  Causes of chest pain or pressure. LET [facility] PROVID[facility] KNOW ABOUT:  Any allergies you have.  All medicines you are taking, including vitamins, herbs, eye drops, creams,  and over-the-counter medicines.  Previous problems you or members of your family have had with the use of anesthetics.  Any blood disorders you have.  Previous surgeries you have had.  Medical conditions you have.  Possibility of pregnancy, if this applies.  If you are currently breastfeeding. RISKS AND COMPLICATIONS Generally, this is a safe procedure. However, as with any procedure, complications can occur. Possible complications include:  You develop pain or pressure in the following areas:  Chest.  Jaw or neck.  Between your shoulder blades.  Radiating down your left arm.  Headache.  Dizziness or light-headedness.  Shortness of breath.  Increased or irregular heartbeat.  Low blood pressure.  Nausea or vomiting.  Flushing.  Redness going up the arm and slight pain during injection of medicine.  Heart attack (rare). BEFORE THE PROCEDURE   Avoid all forms of caffeine for 24 hours before your test or as directed by your health care provider. This includes coffee, tea (even decaffeinated tea), caffeinated sodas, chocolate, cocoa, and certain pain medicines.  Follow your health care provider's instructions regarding eating and drinking before the test.  Take your medicines as directed at regular times with water unless instructed otherwise. Exceptions may include:  If you have diabetes, ask how you are to take your insulin or pills. It is common to adjust insulin dosing the morning of the test.  If you are taking beta-blocker medicines, it is important to talk to your health care provider about these medicines well before the date of your test. Taking beta-blocker medicines may interfere with the test. In some cases, these medicines need to be changed or stopped 24 hours or more before the test.  If you wear a nitroglycerin patch, it may need to be removed prior to the test. Ask your health care provider if the patch should be removed before the test.  If you  use an inhaler for any breathing condition, bring it with you to the test.  If you are an outpatient, bring a snack so you can eat right after the stress phase of the test.  Do not smoke for 4 hours prior to the test or as directed by your health care provider.  Do not apply lotions, powders, creams, or oils on your chest prior to the test.  Wear comfortable shoes and clothing. Let your health care provider know if you were unable to complete or follow the preparations for your test. PROCEDURE   Multiple patches (electrodes) will be put on your chest. If needed, small areas of your chest may be shaved to get better contact with the electrodes. Once the electrodes are attached to your body, multiple wires will be attached to the electrodes, and your heart rate will be monitored.  An IV access will be started. A nuclear trace (isotope) is given. The isotope may be given intravenously, or it may be swallowed. Nuclear refers to several types of radioactive isotopes, and the nuclear isotope lights up the arteries so that the nuclear images are clear. The isotope is absorbed by your body. This results in low radiation exposure.  A resting nuclear image is taken to show how your heart functions at rest.  A medicine is given through the IV access.  A second scan is done about 1 hour after the medicine injection and determines how your heart functions under stress.  During this stress phase, you will be connected to an electrocardiogram machine. Your blood pressure and oxygen levels will be monitored. AFTER THE PROCEDURE   Your heart rate and blood pressure will be monitored after the test.  You may return to your normal schedule, including diet,activities, and medicines, unless your health care provider tells you otherwise.   This information is not intended to replace advice given to you by your health care provider. Make sure you discuss any questions you have with your health care  provider.   Document Released: 12/11/2008 Document Revised: 07/30/2013 Document Reviewed: 04/01/2013 Elsevier Interactive Patient Education [employer]   If you need a refill on your cardiac medications before your next appointment, please call your pharmacy.

## 2015-06-26 NOTE — Progress Notes (Signed)
Cardiology Office Note   Date:  06/26/2015   ID:  Leroy Griffin, DOB 01/28/1946, MRN [id number]  PCP:  Nadean CorwinMCKEOWN,WILLIAM DAVID, MD  Cardiologist:  Dr. Graciela HusbandsKlein    Chief Complaint  Patient presents with  . Shortness of Breath    with exertion      History of Present Illness: Leroy StageHeino W Griffin is a 69 y.o. male who presents for DOE.    He has a history of HTN, hyperlipidemia-controlled with diet and exercise. Morbid obesity. Hx UGI bleed, and gout.  Hx of GI bleed on ASA X 2 and NSAIDS X 1 most recent episode 3 months ago. He is unable to take ASA.    Has been seen by Dr. Graciela HusbandsKlein for an abnormal heart rhythm. ECG was obtained that demonstrated multiple pauses. There are also some blocked PACs given rise to the concern about Mobitz heart block.  Dr. Graciela HusbandsKlein felt the arrhthymia was blocked PACs and PVCs.  Over last 2 months pt has been DOE no real chest pain. And the SOB is not every day but it is noticeable and not his usual.  He has no lightheadedness, no diaphoresis and no nausea.  With rest it resolves.  No awareness of irregular HR.   Past Medical History  Diagnosis Date  . Hypertension   . Hyperlipidemia   . Prediabetes   . Gout   . Vitamin D deficiency   . Essential tremor   . Proteinuria     Past Surgical History  Procedure Laterality Date  . Rt knee       Current Outpatient Prescriptions  Medication Sig Dispense Refill  . acetaminophen-codeine (TYLENOL #3) 300-30 MG tablet TAKE 1 TABLET BY MOUTH FOUR TIMES DAILY 100 tablet 0  . allopurinol (ZYLOPRIM) 300 MG tablet TAKE 1/2 TABLET BY MOUTH DAILY FOR GOUT 45 tablet 3  . atorvastatin (LIPITOR) 20 MG tablet Take 20 mg by mouth daily at 6 PM.    . Cholecalciferol (VITAMIN D PO) Take 8,000 Units by mouth daily.     . diazepam (VALIUM) 5 MG tablet TAKE 1 TABLET BY MOUTH EVERY 8 HOURS AS NEEDED 90 tablet 0  . Flaxseed, Linseed, (FLAX SEED OIL) 1000 MG CAPS Take 1,000 mg by mouth daily.    Marland Kitchen. losartan (COZAAR) 100 MG tablet  TAKE 1/2 TABLET BY MOUTH TWICE DAILY 90 tablet 1  . magnesium gluconate (MAGONATE) 500 MG tablet Take 500 mg by mouth 2 (two) times daily.    . Omega-3 Fatty Acids (FISH OIL) 1200 MG CPDR Take by mouth at bedtime.     . vitamin B-12 (CYANOCOBALAMIN) 1000 MCG tablet Take 1,000 mcg by mouth daily.    Marland Kitchen. zinc gluconate 50 MG tablet Take 50 mg by mouth daily.     No current facility-administered medications for this visit.    Allergies:   Enalapril; Aspirin; Nsaids; Welchol; and Vasotec    Social History:  The patient  reports that he has never smoked. He does not have any smokeless tobacco history on file. He reports that he drinks about 7.0 oz of alcohol per week. He reports that he does not use illicit drugs.   Family History:  The patient's family history includes Dementia in his mother; Emphysema in his brother; Stroke in his father.    ROS:  General:no colds or fevers, no weight changes Skin:no rashes or ulcers HEENT:no blurred vision, no congestion CV:see HPI PUL:see HPI GI:no diarrhea constipation or melena, no indigestion GU:no hematuria, no dysuria MS:no  joint pain, no claudication Neuro:no syncope, no lightheadedness Endo:no diabetes, no thyroid disease  Wt Readings from Last 3 Encounters:  06/26/15 219 lb (99.338 kg)  06/11/15 220 lb (99.791 kg)  03/10/15 221 lb (100.245 kg)     PHYSICAL EXAM: VS:  BP 120/78 mmHg  Pulse 77  Ht 6' (1.829 m)  Wt 219 lb (99.338 kg)  BMI 29.70 kg/m2 , BMI Body mass index is 29.7 kg/(m^2). General:Pleasant affect, NAD Skin:Warm and dry, brisk capillary refill HEENT:normocephalic, sclera clear, mucus membranes moist Neck:supple, no JVD, no bruits  Heart:S1S2 RRR without murmur, gallup, rub or click Lungs:clear without rales, rhonchi, or wheezes ZOX:WRUE, non tender, + BS, do not palpate liver spleen or masses Ext:no lower ext edema, 2+ pedal pulses, 2+ radial pulses Neuro:alert and oriented X 3, MAE, follows commands, + facial  symmetry    EKG:  EKG is ordered today. The ekg ordered today demonstrates SR with sinus arrhythmia with 1st degree AV block at 232 ms, no ST changes from previous.     Recent Labs: 06/11/2015: ALT 21; BUN 19; Creat 0.95; Hemoglobin 16.2; Magnesium 1.7; Platelets 146*; Potassium 4.6; Sodium 140; TSH 1.946    Lipid Panel    Component Value Date/Time   CHOL 183 06/11/2015 1149   TRIG 93 06/11/2015 1149   HDL 75 06/11/2015 1149   CHOLHDL 2.4 06/11/2015 1149   VLDL 19 06/11/2015 1149   LDLCALC 89 06/11/2015 1149       Other studies Reviewed: Additional studies/ records that were reviewed today include: PCP OV and Dr. Odessa Fleming OV.  Labs..   ASSESSMENT AND PLAN:  1.  DOE, possible anginal equivalent, new over last 2 months will plan lexiscan myoview to rule out ischemia. Pt cannot work on treadmill due to knee pain.  He will follow up with Dr. Graciela Husbands for results.  If positive we discussed poss cardiac cath, but his hx of GI bleed on ASA, NSAIDS will be considered.   2. HTN controlled  3. Hyperlipidemia controlled on last labs, followed by PCP.  4. Hx GI bleed X 3.    Current medicines are reviewed with the patient today.  The patient Has no concerns regarding medicines.  The following changes have been made:  See above Labs/ tests ordered today include:see above  Disposition:   FU:  see above  Nyoka Lint, NP  06/26/2015 5:30 PM    [facility] [address] [place], [place], [place]  [address]/ [address] [place], [place] Phone: [phone]; Fax: [phone]  [phone]

## 2015-06-30 DIAGNOSIS — L639 Alopecia areata, unspecified: Secondary | ICD-10-CM | POA: Diagnosis not present

## 2015-07-01 ENCOUNTER — Encounter: Payer: Self-pay | Admitting: Cardiovascular Disease

## 2015-07-13 ENCOUNTER — Ambulatory Visit (HOSPITAL_COMMUNITY): Payer: Medicare Other | Attending: Cardiology

## 2015-07-13 VITALS — Ht 72.0 in | Wt 219.0 lb

## 2015-07-13 DIAGNOSIS — R0609 Other forms of dyspnea: Secondary | ICD-10-CM | POA: Insufficient documentation

## 2015-07-13 DIAGNOSIS — I10 Essential (primary) hypertension: Secondary | ICD-10-CM | POA: Insufficient documentation

## 2015-07-13 DIAGNOSIS — G4440 Drug-induced headache, not elsewhere classified, not intractable: Secondary | ICD-10-CM

## 2015-07-13 DIAGNOSIS — R51 Headache: Secondary | ICD-10-CM | POA: Diagnosis not present

## 2015-07-13 DIAGNOSIS — R079 Chest pain, unspecified: Secondary | ICD-10-CM

## 2015-07-13 DIAGNOSIS — R0602 Shortness of breath: Secondary | ICD-10-CM

## 2015-07-13 LAB — MYOCARDIAL PERFUSION IMAGING
LV dias vol: 124 mL
LV sys vol: 57 mL
Peak HR: 82 {beats}/min
RATE: 0.26
Rest HR: 63 {beats}/min
SDS: 3
SRS: 4
SSS: 7
TID: 0.85

## 2015-07-13 MED ORDER — TECHNETIUM TC 99M SESTAMIBI GENERIC - CARDIOLITE
30.8000 | Freq: Once | INTRAVENOUS | Status: AC | PRN
  Administered 2015-07-13: 30.8 via INTRAVENOUS

## 2015-07-13 MED ORDER — REGADENOSON 0.4 MG/5ML IV SOLN
0.4000 mg | Freq: Once | INTRAVENOUS | Status: AC
  Administered 2015-07-13: 0.4 mg via INTRAVENOUS

## 2015-07-13 MED ORDER — AMINOPHYLLINE 25 MG/ML IV SOLN
75.0000 mg | Freq: Once | INTRAVENOUS | Status: AC
  Administered 2015-07-13: 75 mg via INTRAVENOUS

## 2015-07-13 MED ORDER — TECHNETIUM TC 99M SESTAMIBI GENERIC - CARDIOLITE
10.6000 | Freq: Once | INTRAVENOUS | Status: AC | PRN
  Administered 2015-07-13: 11 via INTRAVENOUS

## 2015-07-14 ENCOUNTER — Other Ambulatory Visit: Payer: Self-pay | Admitting: *Deleted

## 2015-07-14 DIAGNOSIS — R079 Chest pain, unspecified: Secondary | ICD-10-CM

## 2015-07-16 ENCOUNTER — Encounter: Payer: Self-pay | Admitting: Cardiology

## 2015-07-19 ENCOUNTER — Other Ambulatory Visit: Payer: Self-pay | Admitting: Internal Medicine

## 2015-08-05 ENCOUNTER — Ambulatory Visit: Payer: Medicare Other | Admitting: Internal Medicine

## 2015-08-11 ENCOUNTER — Other Ambulatory Visit: Payer: Self-pay

## 2015-08-11 ENCOUNTER — Ambulatory Visit (HOSPITAL_COMMUNITY): Payer: Medicare Other | Attending: Cardiovascular Disease

## 2015-08-11 ENCOUNTER — Telehealth: Payer: Self-pay | Admitting: *Deleted

## 2015-08-11 DIAGNOSIS — I517 Cardiomegaly: Secondary | ICD-10-CM | POA: Diagnosis not present

## 2015-08-11 DIAGNOSIS — I10 Essential (primary) hypertension: Secondary | ICD-10-CM | POA: Insufficient documentation

## 2015-08-11 DIAGNOSIS — R079 Chest pain, unspecified: Secondary | ICD-10-CM

## 2015-08-11 DIAGNOSIS — Z6829 Body mass index (BMI) 29.0-29.9, adult: Secondary | ICD-10-CM | POA: Diagnosis not present

## 2015-08-11 DIAGNOSIS — E785 Hyperlipidemia, unspecified: Secondary | ICD-10-CM | POA: Diagnosis not present

## 2015-08-11 NOTE — Telephone Encounter (Signed)
-----   Message from Leone BrandLaura R Ingold, NP sent at 08/11/2015  2:57 PM EST ----- Let pt know some mild decrease in EF - keep appt with Dr. Graciela HusbandsKlein

## 2015-08-11 NOTE — Telephone Encounter (Signed)
Called pt and advised him of his ECHO results.  Pt also was reminded of his apt with Dr. Graciela HusbandsKlein 08/24/15.  Pt verbalized understanding.

## 2015-08-14 ENCOUNTER — Ambulatory Visit: Payer: Medicare Other | Admitting: Cardiology

## 2015-08-20 ENCOUNTER — Other Ambulatory Visit: Payer: Self-pay | Admitting: Internal Medicine

## 2015-08-20 DIAGNOSIS — M8949 Other hypertrophic osteoarthropathy, multiple sites: Secondary | ICD-10-CM

## 2015-08-20 DIAGNOSIS — M150 Primary generalized (osteo)arthritis: Principal | ICD-10-CM

## 2015-08-20 DIAGNOSIS — M159 Polyosteoarthritis, unspecified: Secondary | ICD-10-CM

## 2015-08-21 NOTE — Progress Notes (Signed)
Patient Care Team: Lucky Cowboy, MD as PCP - General (Internal Medicine) Marina Gravel, MD as Consulting Physician (Nephrology) Carman Ching, MD as Consulting Physician (Gastroenterology) Jacqlyn Krauss, MD as Referring Physician (Dermatology) Mittie Bodo, MD as Referring Physician (Cardiology)   HPI  Leroy Griffin is a 70 y.o. male Seen a year ago with asymptomatic PACs and PVCs.  Seen 11/16 LI-NP for DOE. Concern for possible ischemia and thus underwent lexi myoview with EF 45-55% and no perfusion defects Subsequently echo>> Distal septal and inferobasal hypokinesis. The cavity size was mildly dilated. There was mild concentric hypertrophy. Systolic function was normal. The estimated ejection fraction was in the range of 50% to 55%.  His dyspnea on exertion is notable things like chopping wood. He's had no complaint chest discomfort and no edema nocturnal dyspnea or palpitations.    Records and Results Reviewed outpt records and imaging   Past Medical History  Diagnosis Date  . Hypertension   . Hyperlipidemia   . Prediabetes   . Gout   . Vitamin D deficiency   . Essential tremor   . Proteinuria     Past Surgical History  Procedure Laterality Date  . Rt knee      Current Outpatient Prescriptions  Medication Sig Dispense Refill  . acetaminophen-codeine (TYLENOL #3) 300-30 MG tablet TAKE 1 TABLET BY MOUTH FOUR TIMES DAILY 120 tablet 5  . allopurinol (ZYLOPRIM) 300 MG tablet TAKE 1/2 TABLET BY MOUTH DAILY FOR GOUT 45 tablet 3  . atorvastatin (LIPITOR) 20 MG tablet TAKE 1/2 TO 1 TABLET BY MOUTH EVERY DAY FOR CHOLESTEROL 90 tablet 1  . Cholecalciferol (VITAMIN D PO) Take 8,000 Units by mouth daily.     . diazepam (VALIUM) 5 MG tablet TAKE 1 TABLET BY MOUTH EVERY 8 HOURS AS NEEDED 90 tablet 0  . Flaxseed, Linseed, (FLAX SEED OIL) 1000 MG CAPS Take 1,000 mg by mouth daily.    Marland Kitchen losartan (COZAAR) 100 MG tablet TAKE 1/2 TABLET BY MOUTH TWICE  DAILY 90 tablet 1  . magnesium gluconate (MAGONATE) 500 MG tablet Take 500 mg by mouth 2 (two) times daily.    . Omega-3 Fatty Acids (FISH OIL) 1200 MG CPDR Take by mouth at bedtime.     . vitamin B-12 (CYANOCOBALAMIN) 1000 MCG tablet Take 1,000 mcg by mouth daily.    Marland Kitchen zinc gluconate 50 MG tablet Take 50 mg by mouth daily.     No current facility-administered medications for this visit.    Allergies  Allergen Reactions  . Enalapril Rash  . Aspirin     Internal bleeding  . Nsaids     Internal bleeding  . Welchol [Colesevelam Hcl] Diarrhea  . Vasotec [Enalaprilat] Rash      Review of Systems negative except from HPI and PMH  Physical Exam BP 122/68 mmHg  Pulse 82  Ht 6\' 1"  (1.854 m)  Wt 221 lb (100.245 kg)  BMI 29.16 kg/m2 Well developed and well nourished in no acute distress HENT normal E scleral and icterus clear Neck Supple JVP flat; carotids brisk and full Clear to ausculation  Regular rate and rhythm,2/6 early systolic Soft with active bowel sounds No clubbing cyanosis  Edema Alert and oriented, grossly normal motor and sensory function Skin Warm and Dry  ECG 11/16   Sinus with short marked sinus arrhythmia 77 23/09/35 Morning The patient has mild LV dysfunctionfar we don't have an explanation for it. CardiacMRI  May give [place] further insights  by looking LV function and structure  His ECG suggests the possibility of atrial tachycardia contributing to his dyspnea. Stress testing would be most useful here. Unfortunately his Myoview was done pharmacologically.  Cardiopulmonary stress testing would give us further insight are limited to heart rate response to exercise but also whether there may be a pulmonary contribution or fitness contribution.  As relates to therapy for his cardiomyopathy he is already on an ARB; this would be reasonable based on the SAVE trial  We spent more than 50% of our >25 min visit in face to face counseling regarding the above

## 2015-08-24 ENCOUNTER — Encounter: Payer: Self-pay | Admitting: Internal Medicine

## 2015-08-24 ENCOUNTER — Telehealth (HOSPITAL_COMMUNITY): Payer: Self-pay | Admitting: Vascular Surgery

## 2015-08-24 ENCOUNTER — Ambulatory Visit (INDEPENDENT_AMBULATORY_CARE_PROVIDER_SITE_OTHER): Payer: Medicare Other | Admitting: Internal Medicine

## 2015-08-24 ENCOUNTER — Other Ambulatory Visit: Payer: Self-pay | Admitting: Internal Medicine

## 2015-08-24 VITALS — BP 122/68 | HR 82 | Ht 73.0 in | Wt 221.0 lb

## 2015-08-24 DIAGNOSIS — I519 Heart disease, unspecified: Secondary | ICD-10-CM

## 2015-08-24 DIAGNOSIS — R0600 Dyspnea, unspecified: Secondary | ICD-10-CM

## 2015-08-24 DIAGNOSIS — R0609 Other forms of dyspnea: Secondary | ICD-10-CM

## 2015-08-24 NOTE — Telephone Encounter (Signed)
Rx called in to [facility] pharmacy.

## 2015-08-24 NOTE — Telephone Encounter (Signed)
Left pt message to make appt to get CPX

## 2015-08-24 NOTE — Patient Instructions (Signed)
Medication Instructions: - no changes  Labwork: - none  Procedures/Testing: - Your physician has recommended that you have a cardiopulmonary stress test (CPX). CPX testing is a non-invasive measurement of heart and lung function. It replaces a traditional treadmill stress test. This type of test provides a tremendous amount of information that relates not only to your present condition but also for future outcomes. This test combines measurements of you ventilation, respiratory gas exchange in the lungs, electrocardiogram (EKG), blood pressure and physical response before, during, and following an exercise protocol.  - Your physician has requested that you have a cardiac MRI. Cardiac MRI uses a computer to create images of your heart as its beating, producing both still and moving pictures of your heart and major blood vessels. For further information please visit [email or web address]. Please follow the instruction sheet given to you today for more information.  Follow-Up: - Your physician recommends that you schedule a follow-up appointment in: 6-8 weeks with Dr. Graciela HusbandsKlein.   Any Additional Special Instructions Will Be Listed Below (If Applicable).  ** The day of your Cardiac MRI- please take Valium 10 mg x one dose [facility] **

## 2015-08-31 ENCOUNTER — Other Ambulatory Visit: Payer: Self-pay | Admitting: Physician Assistant

## 2015-09-07 ENCOUNTER — Ambulatory Visit (HOSPITAL_COMMUNITY)
Admission: RE | Admit: 2015-09-07 | Discharge: 2015-09-07 | Disposition: A | Discharge status: Home / Self Care | Payer: Medicare Other | Source: Ambulatory Visit | Attending: Internal Medicine | Admitting: Internal Medicine

## 2015-09-07 DIAGNOSIS — I519 Heart disease, unspecified: Secondary | ICD-10-CM

## 2015-09-07 DIAGNOSIS — Z0189 Encounter for other specified special examinations: Secondary | ICD-10-CM | POA: Insufficient documentation

## 2015-09-07 DIAGNOSIS — R0600 Dyspnea, unspecified: Secondary | ICD-10-CM

## 2015-09-07 DIAGNOSIS — R0609 Other forms of dyspnea: Secondary | ICD-10-CM

## 2015-09-07 LAB — CREATININE, SERUM
Creatinine, Ser: 0.99 mg/dL (ref 0.61–1.24)
GFR calc Af Amer: >60 mL/min (ref >60)
GFR calc non Af Amer: >60 mL/min (ref >60)

## 2015-09-07 MED ORDER — GADOBENATE DIMEGLUMINE 529 MG/ML IV SOLN
30.0000 mL | Freq: Once | INTRAVENOUS | Status: AC | PRN
  Administered 2015-09-07: 30 mL via INTRAVENOUS

## 2015-09-11 ENCOUNTER — Encounter: Payer: Self-pay | Admitting: Internal Medicine

## 2015-09-11 ENCOUNTER — Ambulatory Visit (INDEPENDENT_AMBULATORY_CARE_PROVIDER_SITE_OTHER): Payer: Medicare Other | Admitting: Internal Medicine

## 2015-09-11 VITALS — BP 122/70 | HR 62 | Temp 98.2°F | Resp 16 | Ht 72.05 in | Wt 219.0 lb

## 2015-09-11 DIAGNOSIS — R7303 Prediabetes: Secondary | ICD-10-CM | POA: Diagnosis not present

## 2015-09-11 DIAGNOSIS — Z79899 Other long term (current) drug therapy: Secondary | ICD-10-CM | POA: Diagnosis not present

## 2015-09-11 DIAGNOSIS — I10 Essential (primary) hypertension: Secondary | ICD-10-CM | POA: Diagnosis not present

## 2015-09-11 DIAGNOSIS — E559 Vitamin D deficiency, unspecified: Secondary | ICD-10-CM | POA: Diagnosis not present

## 2015-09-11 DIAGNOSIS — E782 Mixed hyperlipidemia: Secondary | ICD-10-CM | POA: Diagnosis not present

## 2015-09-11 DIAGNOSIS — R7309 Other abnormal glucose: Secondary | ICD-10-CM | POA: Diagnosis not present

## 2015-09-11 LAB — LIPID PANEL
Cholesterol: 167 mg/dL (ref 125–200)
HDL: 58 mg/dL (ref >=40)
LDL Cholesterol: 93 mg/dL (ref <130)
Total CHOL/HDL Ratio: 2.9 Ratio (ref <=5.0)
Triglycerides: 79 mg/dL (ref <150)
VLDL: 16 mg/dL (ref <30)

## 2015-09-11 LAB — CBC WITH DIFFERENTIAL/PLATELET
Basophils Absolute: 0 10*3/uL (ref 0.0–0.1)
Basophils Relative: 1 % (ref 0–1)
Eosinophils Absolute: 0.1 10*3/uL (ref 0.0–0.7)
Eosinophils Relative: 3 % (ref 0–5)
HCT: 47.7 % (ref 39.0–52.0)
Hemoglobin: 16.2 g/dL (ref 13.0–17.0)
Lymphocytes Relative: 42 % (ref 12–46)
Lymphs Abs: 1.6 10*3/uL (ref 0.7–4.0)
MCH: 33.1 pg (ref 26.0–34.0)
MCHC: 34 g/dL (ref 30.0–36.0)
MCV: 97.3 fL (ref 78.0–100.0)
MPV: 9.4 fL (ref 8.6–12.4)
Monocytes Absolute: 0.5 10*3/uL (ref 0.1–1.0)
Monocytes Relative: 13 % — ABNORMAL HIGH (ref 3–12)
Neutro Abs: 1.5 10*3/uL — ABNORMAL LOW (ref 1.7–7.7)
Neutrophils Relative %: 41 % — ABNORMAL LOW (ref 43–77)
Platelets: 130 10*3/uL — ABNORMAL LOW (ref 150–400)
RBC: 4.9 MIL/uL (ref 4.22–5.81)
RDW: 14.5 % (ref 11.5–15.5)
WBC: 3.7 10*3/uL — ABNORMAL LOW (ref 4.0–10.5)

## 2015-09-11 LAB — BASIC METABOLIC PANEL WITH GFR
BUN: 16 mg/dL (ref 7–25)
CO2: 28 mmol/L (ref 20–31)
Calcium: 9.7 mg/dL (ref 8.6–10.3)
Chloride: 100 mmol/L (ref 98–110)
Creat: 0.9 mg/dL (ref 0.70–1.18)
GFR, Est African American: >89 mL/min (ref >=60)
GFR, Est Non African American: 86 mL/min (ref >=60)
Glucose, Bld: 94 mg/dL (ref 65–99)
Potassium: 4.7 mmol/L (ref 3.5–5.3)
Sodium: 140 mmol/L (ref 135–146)

## 2015-09-11 LAB — HEPATIC FUNCTION PANEL
ALT: 22 U/L (ref 9–46)
AST: 29 U/L (ref 10–35)
Albumin: 3.8 g/dL (ref 3.6–5.1)
Alkaline Phosphatase: 46 U/L (ref 40–115)
Bilirubin, Direct: 0.2 mg/dL (ref <=0.2)
Indirect Bilirubin: 0.7 mg/dL (ref 0.2–1.2)
Total Bilirubin: 0.9 mg/dL (ref 0.2–1.2)
Total Protein: 6.4 g/dL (ref 6.1–8.1)

## 2015-09-11 LAB — HEMOGLOBIN A1C
Hgb A1c MFr Bld: 5.5 % (ref <5.7)
Mean Plasma Glucose: 111 mg/dL (ref <117)

## 2015-09-11 LAB — TSH: TSH: 2.056 u[IU]/mL (ref 0.350–4.500)

## 2015-09-11 NOTE — Progress Notes (Signed)
Patient ID: OWENS HARA, male   DOB: 1946/01/03, 70 y.o.   MRN: [id number]  Assessment and Plan:  Hypertension:  -Continue medication,  -monitor blood pressure at home.  -Continue DASH diet.   -Reminder to go to the ER if any CP, SOB, nausea, dizziness, severe HA, changes vision/speech, left arm numbness and tingling, and jaw pain.  Cholesterol: -Continue diet and exercise.  -Check cholesterol.   Pre-diabetes: -Continue diet and exercise.  -Check A1C  Vitamin D Def: -check level -continue medications.   Cardiac rehab for shortness of breath -under Dr. Odessa Fleming care -MRI done -EF 54% -PFTs going  Continue diet and meds as discussed. Further disposition pending results of labs.  HPI 70 y.o. male  presents for 3 month follow up with hypertension, hyperlipidemia, prediabetes and vitamin D.   His blood pressure has been controlled at home, today their BP is BP: 122/70 mmHg.   He does not workout. He denies chest pain and dizziness.   He is on cholesterol medication and denies myalgias. His cholesterol is at goal. The cholesterol last visit was:   Lab Results  Component Value Date   CHOL 183 06/11/2015   HDL 75 06/11/2015   LDLCALC 89 06/11/2015   TRIG 93 06/11/2015   CHOLHDL 2.4 06/11/2015     He has been working on diet and exercise for prediabetes, and denies foot ulcerations, hyperglycemia, hypoglycemia , increased appetite, nausea, paresthesia of the feet, polydipsia, polyuria, visual disturbances, vomiting and weight loss. Last A1C in the office was:  Lab Results  Component Value Date   HGBA1C 5.5 06/11/2015    Patient is on Vitamin D supplement.  Lab Results  Component Value Date   VD25OH 41 06/11/2015      He has been following for cardiology with Dr. Graciela Husbands.  He reports that they told him his EF was 54% and he has a scheduled MRI of his heart and also a CPX test to be done next week.  He is due to see Dr. Graciela Husbands next month at March 8th.    Current  Medications:  Current Outpatient Prescriptions on File Prior to Visit  Medication Sig Dispense Refill  . acetaminophen-codeine (TYLENOL #3) 300-30 MG tablet TAKE 1 TABLET BY MOUTH FOUR TIMES DAILY 100 tablet 0  . allopurinol (ZYLOPRIM) 300 MG tablet TAKE 1/2 TABLET BY MOUTH DAILY FOR GOUT 45 tablet 3  . atorvastatin (LIPITOR) 20 MG tablet TAKE 1/2 TO 1 TABLET BY MOUTH EVERY DAY FOR CHOLESTEROL (Patient taking differently: 1 TABLET BY MOUTH EVERY DAY FOR CHOLESTEROL) 90 tablet 1  . Cholecalciferol (VITAMIN D PO) Take 8,000 Units by mouth daily.     . diazepam (VALIUM) 5 MG tablet TAKE 1 TABLET BY MOUTH EVERY 8 HOURS AS NEEDED 30 tablet 0  . Flaxseed, Linseed, (FLAX SEED OIL) 1000 MG CAPS Take 1,000 mg by mouth daily.    Marland Kitchen losartan (COZAAR) 100 MG tablet TAKE 1/2 TABLET BY MOUTH TWICE DAILY 90 tablet 1  . magnesium gluconate (MAGONATE) 500 MG tablet Take 500 mg by mouth 2 (two) times daily.    . Omega-3 Fatty Acids (FISH OIL) 1200 MG CPDR Take by mouth at bedtime.     . vitamin B-12 (CYANOCOBALAMIN) 1000 MCG tablet Take 1,000 mcg by mouth daily.    Marland Kitchen zinc gluconate 50 MG tablet Take 50 mg by mouth daily.     No current facility-administered medications on file prior to visit.    Medical History:  Past Medical History  Diagnosis Date  . Hypertension   . Hyperlipidemia   . Prediabetes   . Gout   . Vitamin D deficiency   . Essential tremor   . Proteinuria     Allergies:  Allergies  Allergen Reactions  . Enalapril Rash  . Aspirin     Internal bleeding  . Nsaids     Internal bleeding  . Welchol [Colesevelam Hcl] Diarrhea  . Vasotec [Enalaprilat] Rash     Review of Systems:  Review of Systems  Constitutional: Negative for fever, chills and malaise/fatigue.  HENT: Negative for congestion, ear pain and sore throat.   Eyes: Negative.   Respiratory: Positive for shortness of breath. Negative for cough and wheezing.   Cardiovascular: Negative for chest pain, palpitations and leg  swelling.  Gastrointestinal: Negative for heartburn, diarrhea, constipation, blood in stool and melena.  Genitourinary: Negative.   Skin: Negative.   Neurological: Negative for dizziness, loss of consciousness and headaches.  Psychiatric/Behavioral: Negative for depression. The patient is not nervous/anxious and does not have insomnia.     Family history- Review and unchanged  Social history- Review and unchanged  Physical Exam: BP 122/70 mmHg  Pulse 62  Temp(Src) 98.2 F (36.8 C) (Temporal)  Resp 16  Ht 6' 0.05" (1.83 m)  Wt 219 lb (99.338 kg)  BMI 29.66 kg/m2 Wt Readings from Last 3 Encounters:  09/11/15 219 lb (99.338 kg)  08/24/15 221 lb (100.245 kg)  07/13/15 219 lb (99.338 kg)    General Appearance: Well nourished well developed, in no apparent distress. Eyes: PERRLA, EOMs, conjunctiva no swelling or erythema ENT/Mouth: Ear canals normal without obstruction, swelling, erythma, discharge.  TMs normal bilaterally.  Oropharynx moist, clear, without exudate, or postoropharyngeal swelling. Neck: Supple, thyroid normal,no cervical adenopathy  Respiratory: Respiratory effort normal, Breath sounds clear A&P without rhonchi, wheeze, or rale.  No retractions, no accessory usage. Cardio: RRR with no MRGs. Brisk peripheral pulses without edema.  Abdomen: Soft, + BS,  Non tender, no guarding, rebound, hernias, masses. Musculoskeletal: Full ROM, 5/5 strength, Normal gait Skin: Warm, dry without rashes, lesions, ecchymosis.  Neuro: Awake and oriented X 3, Cranial nerves intact. Normal muscle tone, no cerebellar symptoms. Psych: Normal affect, Insight and Judgment appropriate.    Terri Piedra, PA-C 11:14 AM [facility]

## 2015-09-14 ENCOUNTER — Ambulatory Visit (HOSPITAL_COMMUNITY): Payer: Medicare Other | Attending: Internal Medicine

## 2015-09-14 DIAGNOSIS — R0600 Dyspnea, unspecified: Secondary | ICD-10-CM | POA: Insufficient documentation

## 2015-09-14 DIAGNOSIS — R0609 Other forms of dyspnea: Secondary | ICD-10-CM | POA: Diagnosis not present

## 2015-10-14 ENCOUNTER — Encounter: Payer: Self-pay | Admitting: Internal Medicine

## 2015-10-14 ENCOUNTER — Ambulatory Visit (INDEPENDENT_AMBULATORY_CARE_PROVIDER_SITE_OTHER): Payer: Medicare Other | Admitting: Internal Medicine

## 2015-10-14 VITALS — BP 128/74 | HR 76 | Ht 73.0 in | Wt 221.4 lb

## 2015-10-14 DIAGNOSIS — I498 Other specified cardiac arrhythmias: Secondary | ICD-10-CM

## 2015-10-14 DIAGNOSIS — I429 Cardiomyopathy, unspecified: Secondary | ICD-10-CM

## 2015-10-14 NOTE — Patient Instructions (Signed)
Your physician wants you to follow-up in: ONE YEAR WITH DR KLEIN You will receive a reminder letter in the mail two months in advance. If you don't receive a letter, please call our office to schedule the follow-up appointment.    

## 2015-10-14 NOTE — Progress Notes (Signed)
Patient Care Team: Lucky CowboyWilliam McKeown, MD as PCP - General (Internal Medicine) Jacqlyn Krausshristina L Haverstock, MD as Referring Physician (Dermatology) Mittie BodoJeffrey L Klein, MD as Referring Physician (Cardiology) Annie SableKellie Goldsborough, MD as Consulting Physician (Nephrology) Marney Settingandy Peters, MD as Referring Physician (Gastroenterology)   HPI  Leroy Griffin is a 70 y.o. male Seen in follow-up for dyspnea on exertion associated with mild abnormalities on Myoview scan and echocardiogram with an ejection fraction of 45-50%  Further assessment of LV function by MR scanning demonstrated mild LVH depression EF 51% without evidence of gadolinium enhancement  Cardiopulmonary stress testing demonstrated "no evidence of overt ventilatory or circulatory limitation. At peak exercise, patient appears limited due to deconditioning and his body habitus. There is evidence of mild chronotropic incompetence." Maximum heart rate was 114  He has a history of asymptomatic PACs and PVCs.    as he exercises he notes improved exercise tolerance   Records and Results Reviewed outpt records and imaging   Past Medical History  Diagnosis Date  . Hypertension   . Hyperlipidemia   . Prediabetes   . Gout   . Vitamin D deficiency   . Essential tremor   . Proteinuria     Past Surgical History  Procedure Laterality Date  . Rt knee      Current Outpatient Prescriptions  Medication Sig Dispense Refill  . acetaminophen-codeine (TYLENOL #3) 300-30 MG tablet Take 1 tablet by mouth daily.    Marland Kitchen. allopurinol (ZYLOPRIM) 300 MG tablet TAKE 1/2 TABLET BY MOUTH DAILY FOR GOUT 45 tablet 3  . atorvastatin (LIPITOR) 20 MG tablet TAKE 1/2 TO 1 TABLET BY MOUTH EVERY DAY FOR CHOLESTEROL (Patient taking differently: 1 TABLET BY MOUTH EVERY DAY FOR CHOLESTEROL) 90 tablet 1  . Cholecalciferol (VITAMIN D PO) Take 8,000 Units by mouth daily.     . diazepam (VALIUM) 2 MG tablet Take 2 mg by mouth daily.    . Flaxseed, Linseed, (FLAX SEED  OIL) 1000 MG CAPS Take 1,000 mg by mouth daily.    Marland Kitchen. losartan (COZAAR) 100 MG tablet TAKE 1/2 TABLET BY MOUTH TWICE DAILY 90 tablet 1  . magnesium gluconate (MAGONATE) 500 MG tablet Take 500 mg by mouth 2 (two) times daily.    . Omega-3 Fatty Acids (FISH OIL) 1200 MG CPDR Take by mouth at bedtime.     . vitamin B-12 (CYANOCOBALAMIN) 1000 MCG tablet Take 1,000 mcg by mouth daily.    Marland Kitchen. zinc gluconate 50 MG tablet Take 50 mg by mouth daily.     No current facility-administered medications for this visit.    Allergies  Allergen Reactions  . Enalapril Rash  . Aspirin     Internal bleeding  . Nsaids     Internal bleeding  . Welchol [Colesevelam Hcl] Diarrhea  . Vasotec [Enalaprilat] Rash      Review of Systems negative except from HPI and PMH  Physical Exam BP 128/74 mmHg  Pulse 76  Ht 6\' 1"  (1.854 m)  Wt 221 lb 6.4 oz (100.426 kg)  BMI 29.22 kg/m2 Well developed and well nourished in no acute distress HENT normal E scleral and icterus clear Neck Supple JVP flat; carotids brisk and full Clear to ausculation  Regular rate and rhythm,2/6 early systolic Soft with active bowel sounds No clubbing cyanosis  Edema Alert and oriented, grossly normal motor and sensory function Skin Warm and Dry  ECG   Sinus at 76 Intervals 23/09/35    Impression  PACs/PVCs  Chronotropic incompetence-borderline  Dyspnea on exertion  Cardiomyopathy-mild   Continuing to improve with increase in his exercise.  It is possible that there is an association between his mild cardiomyopathy and is chronotropic limits. On the other hand, these may just be at the extremes of the standard distribution of the normal population. As he is exercising and things are improving, we will continue him on his current ARB and then anticipate seeing him in a year unless her changes in symptoms.  It is

## 2015-10-22 ENCOUNTER — Other Ambulatory Visit: Payer: Self-pay | Admitting: Internal Medicine

## 2015-11-13 ENCOUNTER — Other Ambulatory Visit: Payer: Self-pay | Admitting: Physician Assistant

## 2015-11-13 ENCOUNTER — Encounter: Payer: Self-pay | Admitting: Physician Assistant

## 2015-12-03 DIAGNOSIS — I129 Hypertensive chronic kidney disease with stage 1 through stage 4 chronic kidney disease, or unspecified chronic kidney disease: Secondary | ICD-10-CM | POA: Diagnosis not present

## 2015-12-03 DIAGNOSIS — R809 Proteinuria, unspecified: Secondary | ICD-10-CM | POA: Diagnosis not present

## 2015-12-03 DIAGNOSIS — E785 Hyperlipidemia, unspecified: Secondary | ICD-10-CM | POA: Diagnosis not present

## 2015-12-04 ENCOUNTER — Other Ambulatory Visit: Payer: Self-pay | Admitting: Internal Medicine

## 2015-12-04 ENCOUNTER — Other Ambulatory Visit: Payer: Self-pay | Admitting: Physician Assistant

## 2015-12-04 DIAGNOSIS — F419 Anxiety disorder, unspecified: Secondary | ICD-10-CM

## 2015-12-04 DIAGNOSIS — I10 Essential (primary) hypertension: Secondary | ICD-10-CM

## 2015-12-04 DIAGNOSIS — M1000 Idiopathic gout, unspecified site: Secondary | ICD-10-CM

## 2015-12-07 NOTE — Telephone Encounter (Signed)
Rx called into [facility]

## 2015-12-07 NOTE — Progress Notes (Signed)
Rx was called in to pharmacy. 

## 2015-12-08 ENCOUNTER — Encounter: Payer: Self-pay | Admitting: Internal Medicine

## 2015-12-21 ENCOUNTER — Encounter: Payer: Self-pay | Admitting: Internal Medicine

## 2016-02-16 ENCOUNTER — Ambulatory Visit (INDEPENDENT_AMBULATORY_CARE_PROVIDER_SITE_OTHER): Payer: Medicare Other | Admitting: Internal Medicine

## 2016-02-16 ENCOUNTER — Encounter: Payer: Self-pay | Admitting: Internal Medicine

## 2016-02-16 VITALS — BP 118/72 | HR 76 | Temp 97.5°F | Resp 16 | Ht 72.0 in | Wt 224.6 lb

## 2016-02-16 DIAGNOSIS — R7303 Prediabetes: Secondary | ICD-10-CM

## 2016-02-16 DIAGNOSIS — G250 Essential tremor: Secondary | ICD-10-CM | POA: Diagnosis not present

## 2016-02-16 DIAGNOSIS — E559 Vitamin D deficiency, unspecified: Secondary | ICD-10-CM | POA: Diagnosis not present

## 2016-02-16 DIAGNOSIS — M1000 Idiopathic gout, unspecified site: Secondary | ICD-10-CM | POA: Diagnosis not present

## 2016-02-16 DIAGNOSIS — I10 Essential (primary) hypertension: Secondary | ICD-10-CM

## 2016-02-16 DIAGNOSIS — Z125 Encounter for screening for malignant neoplasm of prostate: Secondary | ICD-10-CM | POA: Diagnosis not present

## 2016-02-16 DIAGNOSIS — Z136 Encounter for screening for cardiovascular disorders: Secondary | ICD-10-CM | POA: Diagnosis not present

## 2016-02-16 DIAGNOSIS — Z1212 Encounter for screening for malignant neoplasm of rectum: Secondary | ICD-10-CM

## 2016-02-16 DIAGNOSIS — Z79899 Other long term (current) drug therapy: Secondary | ICD-10-CM

## 2016-02-16 DIAGNOSIS — E782 Mixed hyperlipidemia: Secondary | ICD-10-CM | POA: Diagnosis not present

## 2016-02-16 DIAGNOSIS — N320 Bladder-neck obstruction: Secondary | ICD-10-CM | POA: Diagnosis not present

## 2016-02-16 LAB — CBC WITH DIFFERENTIAL/PLATELET
Basophils Absolute: 0 cells/uL (ref 0–200)
Basophils Relative: 0 %
Eosinophils Absolute: 112 cells/uL (ref 15–500)
Eosinophils Relative: 2 %
HCT: 45.4 % (ref 38.5–50.0)
Hemoglobin: 15.6 g/dL (ref 13.2–17.1)
Lymphocytes Relative: 38 %
Lymphs Abs: 2128 cells/uL (ref 850–3900)
MCH: 33.3 pg — ABNORMAL HIGH (ref 27.0–33.0)
MCHC: 34.4 g/dL (ref 32.0–36.0)
MCV: 96.8 fL (ref 80.0–100.0)
MPV: 9.9 fL (ref 7.5–12.5)
Monocytes Absolute: 560 cells/uL (ref 200–950)
Monocytes Relative: 10 %
Neutro Abs: 2800 cells/uL (ref 1500–7800)
Neutrophils Relative %: 50 %
Platelets: 143 10*3/uL (ref 140–400)
RBC: 4.69 MIL/uL (ref 4.20–5.80)
RDW: 14.4 % (ref 11.0–15.0)
WBC: 5.6 10*3/uL (ref 3.8–10.8)

## 2016-02-16 NOTE — Progress Notes (Signed)
Patient ID: Leroy Griffin, male   DOB: 06/19/1946, 70 y.o.   MRN: [id number]  [facility]    Lucky CowboyWilliam Jaaron Oleson, M.D.    Dyanne CarrelAmanda R. Steffanie Dunnollier, P.A.-C      Terri Piedraourtney Forcucci, P.A.-C   [facility]                [address]                [place], [place] [id number] Telephone [phone] Telefax [phone]) [phone]  Comprehensive Evaluation & Examination     This very nice 70 y.o. MWM presents for a comprehensive evaluation and management of multiple medical co-morbidities.  Patient has been followed for HTN, Prediabetes, Hyperlipidemia and Vitamin D Deficiency.     HTN predates since 481988. Patient's BP has been controlled at home usu ranging betw 120-130/65-75. .Today's BP is  118/72 mmHg. Patient has HT CKD 2  (GFR 86 ml/min) and proteinuria and is followed by Dr Julien GirtKelli Goldsborough. Patient is followed by Dr Graciela HusbandsKlein for asymptomatic PVB's & PAB's and last year had neg & normal nuclear cardiac screening. Patient denies any cardiac symptoms as chest pain, palpitations, dizziness or ankle swelling, but does have exertional shortness of breath attributed to deconditioning.      Patient's hyperlipidemia is controlled with diet and medications. Patient denies myalgias or other medication SE's. Last lipids were Cholesterol 167; HDL 58; LDL 93; Triglycerides 79 on 09/11/2015.      Patient has Morbid Obesity & is proactively screened expectantly for prediabetes  and patient denies reactive hypoglycemic symptoms, visual blurring, diabetic polys or paresthesias. Last A1c was  5.5% on 09/11/2015.     Finally, patient has history of Vitamin D Deficiency of "11" in 2009 and last vitamin D was 70 on 06/11/2015.    Medication Sig  . TYL #3 300-30 MG TAKE 1 TABLET BY MOUTH FOUR TIMES DAILY  . Allopurinol 300 MG  Take 1/2 to 1 tablet daily as directed for Gout  . atorvastatin 20 MG  TAKE 1/2 TO 1 TABLET BY MOUTH EVERY DAY FOR CHOL  . VITAMIN D  Take 8,000  Units by mouth daily.   . diazepam  5 MG tablet Take 1/2 to 1 tablet 3 x/day if needed for anxiety or muscle spasm  . FLAX SEED OIL 1000 MG  Take 1,000 mg by mouth daily.  Marland Kitchen. losartan ) 100 MG tablet TAKE 1/2 TABLET BY MOUTH TWICE DAILY  . magnesium  500 MG tablet Take 500 mg by mouth 2 (two) times daily.  . Omega-3 FISH OIL 1200 MG  Take by mouth at bedtime.   . vitamin B-12 1000 MCG tab Take 1,000 mcg by mouth daily.  Marland Kitchen. zinc  50 MG tablet Take 50 mg by mouth daily.   Allergies  Allergen Reactions  . Enalapril Rash  . Aspirin     Internal bleeding  . Nsaids     Internal bleeding  . Welchol [Colesevelam Hcl] Diarrhea  . Vasotec [Enalaprilat] Rash   Past Medical History  Diagnosis Date  . Hypertension   . Hyperlipidemia   . Prediabetes   . Gout   . Vitamin D deficiency   . Essential tremor   . Proteinuria    Health Maintenance  Topic Date Due  . Hepatitis C Screening  Nov 15, 1945  . COLONOSCOPY  03/09/1994  . INFLUENZA VACCINE  03/08/2016  . TETANUS/TDAP  01/29/2024  . ZOSTAVAX  Completed  . PNA vac Low  Risk Adult  Completed   Immunization History  Administered Date(s) Administered  . Influenza Split 05/23/2013  . Influenza, High Dose Seasonal PF 05/14/2014, 06/11/2015  . Pneumococcal Conjugate-13 12/03/2014  . Pneumococcal Polysaccharide-23 06/01/2010  . Td 06/01/2010  . Tdap 01/28/2014  . Zoster 08/08/2004   Past Surgical History  Procedure Laterality Date  . Rt knee     Family History  Problem Relation Age of Onset  . Dementia Mother   . Stroke Father   . Emphysema Brother    Social History   Social History  . Marital Status: Married    Spouse Name: N/A  . Number of Children: N/A  . Years of Education: N/A   Occupational History  . Retired [employer]   Social History Main Topics  . Smoking status: Never Smoker   . Smokeless tobacco: Not on file  . Alcohol Use: 7.0 oz/week    14 drink(s) per week  . Drug Use: No    ROS Constitutional: Denies  fever, chills, weight loss/gain, headaches, insomnia,  night sweats or change in appetite. Does c/o fatigue. Eyes: Denies redness, blurred vision, diplopia, discharge, itchy or watery eyes.  ENT: Denies discharge, congestion, post nasal drip, epistaxis, sore throat, earache, hearing loss, dental pain, Tinnitus, Vertigo, Sinus pain or snoring.  Cardio: Denies chest pain, palpitations, irregular heartbeat, syncope, dyspnea, diaphoresis, orthopnea, PND, claudication or edema Respiratory: denies cough, dyspnea, DOE, pleurisy, hoarseness, laryngitis or wheezing.  Gastrointestinal: Denies dysphagia, heartburn, reflux, water brash, pain, cramps, nausea, vomiting, bloating, diarrhea, constipation, hematemesis, melena, hematochezia, jaundice or hemorrhoids Genitourinary: Denies dysuria, frequency, urgency, nocturia, hesitancy, discharge, hematuria or flank pain Musculoskeletal: Denies arthralgia, myalgia, stiffness, Jt. Swelling, pain, limp or strain/sprain. Denies Falls. Skin: Denies puritis, rash, hives, warts, acne, eczema or change in skin lesion Neuro: No weakness, tremor, incoordination, spasms, paresthesia or pain Psychiatric: Denies confusion, memory loss or sensory loss. Denies Depression. Endocrine: Denies change in weight, skin, hair change, nocturia, and paresthesia, diabetic polys, visual blurring or hyper / hypo glycemic episodes.  Heme/Lymph: No excessive bleeding, bruising or enlarged lymph nodes.  Physical Exam  BP 118/72     Pulse 76  Temp97.5 F   Resp 16  Ht 6' Wt 224 lb 9.6 oz     BMI 30.45   General Appearance: Well nourished, in no apparent distress. Eyes: PERRLA, EOMs, conjunctiva no swelling or erythema, normal fundi and vessels. Sinuses: No frontal/maxillary tenderness ENT/Mouth: EACs patent / TMs  nl. Nares clear without erythema, swelling, mucoid exudates. Oral hygiene is good. No erythema, swelling, or exudate. Tongue normal, non-obstructing. Tonsils not swollen or  erythematous. Hearing normal.  Neck: Supple, thyroid normal. No bruits, nodes or JVD. Respiratory: Respiratory effort normal.  BS equal and clear bilateral without rales, rhonci, wheezing or stridor. Cardio: Heart sounds are normal with regular rate and rhythm and no murmurs, rubs or gallops. Peripheral pulses are normal and equal bilaterally without edema. No aortic or femoral bruits. Chest: symmetric with normal excursions and percussion.  Abdomen: Soft, with Nl bowel sounds. Nontender, no guarding, rebound, hernias, masses, or organomegaly.  Lymphatics: Non tender without lymphadenopathy.  Genitourinary: No hernias.Testes nl. DRE - prostate nl for age - smooth & firm w/o nodules. Musculoskeletal: Full ROM all peripheral extremities, joint stability, 5/5 strength, and normal gait. Skin: Warm and dry without rashes, lesions, cyanosis, clubbing or  ecchymosis.  Neuro: Cranial nerves intact, reflexes equal bilaterally. Normal muscle tone, no cerebellar symptoms. Sensation intact.  Pysch: Alert and oriented X 3 with  normal affect, insight and judgment appropriate.   Assessment and Plan  1. Annual Preventative/Screening Exam   - Microalbumin / creatinine urine ratio - EKG 12-Lead - [place], RETROPERITNL ABD,  LTD - TSH  2. Hyperlipidemia  - [place], RETROPERITNL ABD,  LTD - Lipid panel - TSH  3. Prediabetes  - [place], RETROPERITNL ABD,  LTD - Hemoglobin A1c - Insulin, random  4. Vitamin D deficiency  - VITAMIN D 25 Hydroxy   5. Morbid obesity due to excess calories (HCC)   6. Idiopathic gout, unspecified chronicity, unspecified site   7. Essential tremor   8. Screening for rectal cancer  - POC Hemoccult Bld/Stl   9. Prostate cancer screening  - PSA  10. Medication management  - Urinalysis, Routine w reflex microscopic  - CBC with Differential/Platelet - BASIC METABOLIC PANEL WITH GFR - Magnesium - Hepatic function panel  11. Screening for AAA (aortic abdominal  aneurysm)  - [place], RETROPERITNL ABD,  LTD  12. Screening for ischemic heart disease  - [place], RETROPERITNL ABD,  LTD  13. Bladder neck obstruction  - PSA   Continue prudent diet as discussed, weight control, BP monitoring, regular exercise, and medications as discussed.  Discussed med effects and SE's. Routine screening labs and tests as requested with regular follow-up as recommended. Over 40 minutes of exam, counseling, chart review and high complex critical decision making was performed

## 2016-02-16 NOTE — Patient Instructions (Signed)

## 2016-02-17 LAB — MICROALBUMIN / CREATININE URINE RATIO
Creatinine, Urine: 247 mg/dL (ref 20–370)
Microalb Creat Ratio: 334 mcg/mg creat — ABNORMAL HIGH (ref <30)
Microalb, Ur: 82.4 mg/dL

## 2016-02-17 LAB — LIPID PANEL
Cholesterol: 189 mg/dL (ref 125–200)
HDL: 53 mg/dL (ref >=40)
LDL Cholesterol: 87 mg/dL (ref <130)
Total CHOL/HDL Ratio: 3.6 Ratio (ref <=5.0)
Triglycerides: 245 mg/dL — ABNORMAL HIGH (ref <150)
VLDL: 49 mg/dL — ABNORMAL HIGH (ref <30)

## 2016-02-17 LAB — HEPATIC FUNCTION PANEL
ALT: 16 U/L (ref 9–46)
AST: 22 U/L (ref 10–35)
Albumin: 4.1 g/dL (ref 3.6–5.1)
Alkaline Phosphatase: 50 U/L (ref 40–115)
Bilirubin, Direct: 0.2 mg/dL (ref <=0.2)
Indirect Bilirubin: 0.7 mg/dL (ref 0.2–1.2)
Total Bilirubin: 0.9 mg/dL (ref 0.2–1.2)
Total Protein: 6.6 g/dL (ref 6.1–8.1)

## 2016-02-17 LAB — BASIC METABOLIC PANEL WITH GFR
BUN: 23 mg/dL (ref 7–25)
CO2: 27 mmol/L (ref 20–31)
Calcium: 9.7 mg/dL (ref 8.6–10.3)
Chloride: 103 mmol/L (ref 98–110)
Creat: 1.31 mg/dL — ABNORMAL HIGH (ref 0.70–1.18)
GFR, Est African American: 63 mL/min (ref >=60)
GFR, Est Non African American: 54 mL/min — ABNORMAL LOW (ref >=60)
Glucose, Bld: 112 mg/dL — ABNORMAL HIGH (ref 65–99)
Potassium: 4.6 mmol/L (ref 3.5–5.3)
Sodium: 140 mmol/L (ref 135–146)

## 2016-02-17 LAB — URINALYSIS, ROUTINE W REFLEX MICROSCOPIC
Bilirubin Urine: NEGATIVE
Glucose, UA: NEGATIVE
Hgb urine dipstick: NEGATIVE
Ketones, ur: NEGATIVE
Leukocytes, UA: NEGATIVE
Nitrite: NEGATIVE
Specific Gravity, Urine: 1.021 (ref 1.001–1.035)
pH: 6.5 (ref 5.0–8.0)

## 2016-02-17 LAB — URINALYSIS, MICROSCOPIC ONLY
Bacteria, UA: NONE SEEN [HPF]
Casts: NONE SEEN [LPF]
Crystals: NONE SEEN [HPF]
Squamous Epithelial / LPF: NONE SEEN [HPF] (ref <=5)
Yeast: NONE SEEN [HPF]

## 2016-02-17 LAB — HEMOGLOBIN A1C
Hgb A1c MFr Bld: 5.4 % (ref <5.7)
Mean Plasma Glucose: 108 mg/dL

## 2016-02-17 LAB — MAGNESIUM: Magnesium: 1.8 mg/dL (ref 1.5–2.5)

## 2016-02-17 LAB — TSH: TSH: 2.55 mIU/L (ref 0.40–4.50)

## 2016-02-17 LAB — PSA: PSA: 1.32 ng/mL (ref <=4.00)

## 2016-02-17 LAB — INSULIN, RANDOM: Insulin: 20.2 u[IU]/mL — ABNORMAL HIGH (ref 2.0–19.6)

## 2016-02-17 LAB — VITAMIN D 25 HYDROXY (VIT D DEFICIENCY, FRACTURES): Vit D, 25-Hydroxy: 74 ng/mL (ref 30–100)

## 2016-02-23 DIAGNOSIS — H259 Unspecified age-related cataract: Secondary | ICD-10-CM | POA: Diagnosis not present

## 2016-03-05 ENCOUNTER — Other Ambulatory Visit: Payer: Self-pay | Admitting: Internal Medicine

## 2016-05-19 ENCOUNTER — Encounter: Payer: Self-pay | Admitting: Internal Medicine

## 2016-05-19 ENCOUNTER — Ambulatory Visit (INDEPENDENT_AMBULATORY_CARE_PROVIDER_SITE_OTHER): Payer: Medicare Other | Admitting: Internal Medicine

## 2016-05-19 VITALS — BP 142/80 | HR 70 | Temp 98.2°F | Resp 16 | Wt 223.0 lb

## 2016-05-19 DIAGNOSIS — Z23 Encounter for immunization: Secondary | ICD-10-CM | POA: Diagnosis not present

## 2016-05-19 DIAGNOSIS — E559 Vitamin D deficiency, unspecified: Secondary | ICD-10-CM

## 2016-05-19 DIAGNOSIS — Z0001 Encounter for general adult medical examination with abnormal findings: Secondary | ICD-10-CM | POA: Diagnosis not present

## 2016-05-19 DIAGNOSIS — I455 Other specified heart block: Secondary | ICD-10-CM

## 2016-05-19 DIAGNOSIS — Z136 Encounter for screening for cardiovascular disorders: Secondary | ICD-10-CM | POA: Diagnosis not present

## 2016-05-19 DIAGNOSIS — R7303 Prediabetes: Secondary | ICD-10-CM

## 2016-05-19 DIAGNOSIS — I10 Essential (primary) hypertension: Secondary | ICD-10-CM | POA: Diagnosis not present

## 2016-05-19 DIAGNOSIS — R6889 Other general symptoms and signs: Secondary | ICD-10-CM

## 2016-05-19 DIAGNOSIS — Z79899 Other long term (current) drug therapy: Secondary | ICD-10-CM

## 2016-05-19 DIAGNOSIS — G250 Essential tremor: Secondary | ICD-10-CM

## 2016-05-19 DIAGNOSIS — E782 Mixed hyperlipidemia: Secondary | ICD-10-CM | POA: Diagnosis not present

## 2016-05-19 DIAGNOSIS — Z0000 Encounter for general adult medical examination without abnormal findings: Secondary | ICD-10-CM

## 2016-05-19 LAB — CBC WITH DIFFERENTIAL/PLATELET
Basophils Absolute: 0 cells/uL (ref 0–200)
Basophils Relative: 0 %
Eosinophils Absolute: 98 cells/uL (ref 15–500)
Eosinophils Relative: 2 %
HCT: 47 % (ref 38.5–50.0)
Hemoglobin: 16.1 g/dL (ref 13.2–17.1)
Lymphocytes Relative: 38 %
Lymphs Abs: 1862 cells/uL (ref 850–3900)
MCH: 33.1 pg — ABNORMAL HIGH (ref 27.0–33.0)
MCHC: 34.3 g/dL (ref 32.0–36.0)
MCV: 96.7 fL (ref 80.0–100.0)
MPV: 9.1 fL (ref 7.5–12.5)
Monocytes Absolute: 441 cells/uL (ref 200–950)
Monocytes Relative: 9 %
Neutro Abs: 2499 cells/uL (ref 1500–7800)
Neutrophils Relative %: 51 %
Platelets: 144 10*3/uL (ref 140–400)
RBC: 4.86 MIL/uL (ref 4.20–5.80)
RDW: 14.4 % (ref 11.0–15.0)
WBC: 4.9 10*3/uL (ref 3.8–10.8)

## 2016-05-19 LAB — LIPID PANEL
Cholesterol: 195 mg/dL (ref 125–200)
HDL: 57 mg/dL (ref >=40)
LDL Cholesterol: 102 mg/dL (ref <130)
Total CHOL/HDL Ratio: 3.4 Ratio (ref <=5.0)
Triglycerides: 178 mg/dL — ABNORMAL HIGH (ref <150)
VLDL: 36 mg/dL — ABNORMAL HIGH (ref <30)

## 2016-05-19 LAB — BASIC METABOLIC PANEL WITHOUT GFR
BUN: 19 mg/dL (ref 7–25)
CO2: 25 mmol/L (ref 20–31)
Calcium: 9.9 mg/dL (ref 8.6–10.3)
Chloride: 102 mmol/L (ref 98–110)
Creat: 0.87 mg/dL (ref 0.70–1.18)
GFR, Est African American: >89 mL/min
GFR, Est Non African American: 86 mL/min
Glucose, Bld: 91 mg/dL (ref 65–99)
Potassium: 4.5 mmol/L (ref 3.5–5.3)
Sodium: 138 mmol/L (ref 135–146)

## 2016-05-19 LAB — HEPATIC FUNCTION PANEL
ALT: 16 U/L (ref 9–46)
AST: 26 U/L (ref 10–35)
Albumin: 4 g/dL (ref 3.6–5.1)
Alkaline Phosphatase: 48 U/L (ref 40–115)
Bilirubin, Direct: 0.2 mg/dL (ref <=0.2)
Indirect Bilirubin: 0.7 mg/dL (ref 0.2–1.2)
Total Bilirubin: 0.9 mg/dL (ref 0.2–1.2)
Total Protein: 6.7 g/dL (ref 6.1–8.1)

## 2016-05-19 LAB — TSH: TSH: 2.2 mIU/L (ref 0.40–4.50)

## 2016-05-19 NOTE — Progress Notes (Signed)
MEDICARE ANNUAL WELLNESS VISIT AND FOLLOW UP Assessment:   1. Need for prophylactic vaccination and inoculation against influenza -due next year - Flu vaccine HIGH DOSE PF (Fluzone High dose)  2. Essential hypertension -well controlled -dash diet -monitor at home -cont meds - TSH  3. Sinus pause   With blocked PACs, PVCs and sinus arrhythmia -followed by cards -cont diet and exercise as tolerated  4. Essential tremor -cont valium -possible that this is tremor from parkinsons instead - TSH  5. Hyperlipidemia -cont diet and exercise - Lipid panel  6. Prediabetes -cont diet and exercise - Hemoglobin A1c  7. Vitamin D deficiency -cont Vit D supplement  8. Medication management -check labs quartely - CBC with Differential/Platelet - BASIC METABOLIC PANEL WITH GFR - Hepatic function panel  9.  Balance difficulty -some cog wheel rigidity, shuffling bent over gait, tremor and mild micrographia on exam as well as mask facies.  Concern for possible parkinsons.   -get MRI and will have him see Dr. Oneta RackMcKeown for further evaluation -can do trial of sinemet at next visit when he comes in to see Dr. Oneta RackMcKeown.   -also may benefit from physical therapy  Over 30 minutes of exam, counseling, chart review, and critical decision making was performed  Future Appointments Date Time Provider Department Center  08/24/2016 11:00 AM Lucky CowboyWilliam McKeown, MD [facility] None  03/28/2017 3:00 PM Lucky CowboyWilliam McKeown, MD [facility] None     Plan:   During the course of the visit the patient was educated and counseled about appropriate screening and preventive services including:    Pneumococcal vaccine   Influenza vaccine  Prevnar 13  Td vaccine  Screening electrocardiogram  Colorectal cancer screening  Diabetes screening  Glaucoma screening  Nutrition counseling    Subjective:  Leroy Griffin is a 70 y.o. male who presents for Medicare Annual Wellness Visit and 3 month follow up  for HTN, hyperlipidemia, prediabetes, and vitamin D Def.   His blood pressure has been controlled at home, today their BP is BP: (!) 142/80 He does not workout. He denies chest pain, shortness of breath, dizziness.  He is on cholesterol medication and denies myalgias. His cholesterol is at goal. The cholesterol last visit was:  Lab Results  Component Value Date   CHOL 189 02/16/2016   HDL 53 02/16/2016   LDLCALC 87 02/16/2016   TRIG 245 (H) 02/16/2016   CHOLHDL 3.6 02/16/2016   He does have diet controlled prediabetes history.  He has been well controlled and asymptomatic for the last several months.  He does exercise daily with his home bicycle. Lab Results  Component Value Date   HGBA1C 5.4 02/16/2016   Last GFR Lab Results  Component Value Date   GFRNONAA 54 (L) 02/16/2016     Lab Results  Component Value Date   GFRAA 63 02/16/2016   Patient is on Vitamin D supplement.   Lab Results  Component Value Date   VD25OH 74 02/16/2016      Patient reports that he has been having some trouble with walking.  He reports that his balance is off.  He feels like his knees are aching and he has some known issues with his back.  He reports that he has fallen or stumbled multiple times.  He reports that he is tripping a lot.  He reports that he does not feel buckling sensations.  No tingling or numbness.  He does feel like the quads get sore, but no weak sensations.  He does  lean forward with walking.  He constantly holds on to frames and furniture to walk without falling or stumbling.  No dizziness or lightheadedness.  He reports that he only takes a half tablet of valium once in the morning.  He will take a full tablet of Tylenol #3 in the mornings.  He reports that his hand writing is getting smaller and worse.  He does have difficulty sometimes initiating movements.  He does have a tremor which responds well to valium.  There is no family history of parkinsons disease he is aware of, but both  his dad and his brother also stoop significantly with walking.      Medication Review: Current Outpatient Prescriptions on File Prior to Visit  Medication Sig Dispense Refill  . acetaminophen-codeine (TYLENOL #3) 300-30 MG tablet TAKE 1 TABLET BY MOUTH FOUR TIMES DAILY 100 tablet 3  . allopurinol (ZYLOPRIM) 300 MG tablet Take 1/2 to 1 tablet daily as directed for Gout 90 tablet 1  . atorvastatin (LIPITOR) 20 MG tablet TAKE 1/2 TO 1 TABLET BY MOUTH EVERY DAY FOR CHOLESTEROL 90 tablet 1  . Cholecalciferol (VITAMIN D PO) Take 8,000 Units by mouth daily.     . diazepam (VALIUM) 5 MG tablet Take 1/2 to 1 tablet 3 x/day if needed for anxiety or muscle spasm 90 tablet 1  . Flaxseed, Linseed, (FLAX SEED OIL) 1000 MG CAPS Take 1,000 mg by mouth daily.    . magnesium gluconate (MAGONATE) 500 MG tablet Take 500 mg by mouth 2 (two) times daily.    . Omega-3 Fatty Acids (FISH OIL) 1200 MG CPDR Take by mouth at bedtime.     . vitamin B-12 (CYANOCOBALAMIN) 1000 MCG tablet Take 1,000 mcg by mouth daily.    Marland Kitchen zinc gluconate 50 MG tablet Take 50 mg by mouth daily.     No current facility-administered medications on file prior to visit.     Allergies: Allergies  Allergen Reactions  . Enalapril Rash  . Aspirin     Internal bleeding  . Nsaids     Internal bleeding  . Welchol [Colesevelam Hcl] Diarrhea  . Vasotec [Enalaprilat] Rash    Current Problems (verified) has Essential hypertension; Left rotator cuff syndrome; Prediabetes; Gout; Vitamin D deficiency; Hyperlipidemia; Essential tremor; Proteinuria; Medication management; Sinus pause   With blocked PACs, PVCs and sinus arrhythmia; Morbid obesity (BMI 30+); Gastric ulcer; and BMI 29.0-29.9,adult on his problem list.  Screening Tests Immunization History  Administered Date(s) Administered  . Influenza Split 05/23/2013  . Influenza, High Dose Seasonal PF 05/14/2014, 06/11/2015  . Pneumococcal Conjugate-13 12/03/2014  . Pneumococcal  Polysaccharide-23 06/01/2010  . Td 06/01/2010  . Tdap 01/28/2014  . Zoster 08/08/2004    Preventative care: Last colonoscopy: 2013, [place]  Prior vaccinations: TD or Tdap: 2015  Influenza: 2017  Pneumococcal: 2011 Prevnar13: 2016 Shingles/Zostavax: 2006  Names of Other Physician/Practitioners you currently use: 1. [facility] here for primary care 2. , eye doctor, last visit  3. , dentist, last visit  Patient Care Team: Lucky Cowboy, MD as PCP - General (Internal Medicine) Jacqlyn Krauss, MD as Referring Physician (Dermatology) Mittie Bodo, MD as Referring Physician (Cardiology) Annie Sable, MD as Consulting Physician (Nephrology) Marney Setting, MD as Referring Physician (Gastroenterology)  Surgical: He  has a past surgical history that includes Rt knee. Family His family history includes Dementia in his mother; Emphysema in his brother; Stroke in his father. Social history  He reports that he  has never smoked. He does not have any smokeless tobacco history on file. He reports that he drinks about 7.0 oz of alcohol per week . He reports that he does not use drugs.  MEDICARE WELLNESS OBJECTIVES: Physical activity:   Cardiac risk factors:   Depression/mood screen:   Depression screen [facility] 2/9 02/16/2016  Decreased Interest 0  Down, Depressed, Hopeless 0  PHQ - 2 Score 0    ADLs:  In your present state of health, do you have any difficulty performing the following activities: 02/16/2016  Hearing? N  Vision? N  Difficulty concentrating or making decisions? N  Walking or climbing stairs? N  Dressing or bathing? N  Doing errands, shopping? N  Some recent data might be hidden     Cognitive Testing  Alert? Yes  Normal Appearance?Yes  Oriented to person? Yes  Place? Yes   Time? Yes  Recall of three objects?  Yes  Can perform simple calculations? Yes  Displays appropriate judgment?Yes  Can read the  correct time from a watch face?Yes  EOL planning:     Objective:   Today's Vitals   05/19/16 1107  BP: (!) 142/80  Pulse: 70  Resp: 16  Temp: 98.2 F (36.8 C)  TempSrc: Temporal  Weight: 223 lb (101.2 kg)  Height: (P) 6' (1.829 m)   Body mass index is 30.24 kg/m (pended).  General appearance: alert, no distress, WD/WN, male, Mask facies HEENT: normocephalic, sclerae anicteric, TMs pearly, nares patent, no discharge or erythema, pharynx normal Oral cavity: MMM, no lesions Neck: supple, no lymphadenopathy, no thyromegaly, no masses Heart: RRR, normal S1, S2, no murmurs Lungs: CTA bilaterally, no wheezes, rhonchi, or rales Abdomen: +bs, soft, non tender, non distended, no masses, no hepatomegaly, no splenomegaly Musculoskeletal: nontender, no swelling, no obvious deformity Extremities: no edema, no cyanosis, no clubbing Pulses: 2+ symmetric, upper and lower extremities, normal cap refill Neurological: alert, oriented x 3, CN2-12 intact, strength normal upper extremities and lower extremities, micrographia with writing although handwriting is legible, cogwheel rigidity present in bilateral upper extremities, mild tremor of the right hand present, sensation normal throughout, DTRs 2+ throughout, no cerebellar signs, shuffling bent over gait.   Psychiatric: normal affect, behavior normal, pleasant   Medicare Attestation I have personally reviewed: The patient's medical and social history Their use of alcohol, tobacco or illicit drugs Their current medications and supplements The patient's functional ability including ADLs,fall risks, home safety risks, cognitive, and hearing and visual impairment Diet and physical activities Evidence for depression or mood disorders  The patient's weight, height, BMI, and visual acuity have been recorded in the chart.  I have made referrals, counseling, and provided education to the patient based on review of the above and I have provided the  patient with a written personalized care plan for preventive services.     Terri Piedra, PA-C   05/19/2016

## 2016-05-20 LAB — HEMOGLOBIN A1C
Hgb A1c MFr Bld: 5.2 % (ref <5.7)
Mean Plasma Glucose: 103 mg/dL

## 2016-05-31 DIAGNOSIS — Z23 Encounter for immunization: Secondary | ICD-10-CM | POA: Diagnosis not present

## 2016-05-31 DIAGNOSIS — L639 Alopecia areata, unspecified: Secondary | ICD-10-CM | POA: Diagnosis not present

## 2016-06-13 ENCOUNTER — Other Ambulatory Visit: Payer: Self-pay | Admitting: Internal Medicine

## 2016-06-14 DIAGNOSIS — R809 Proteinuria, unspecified: Secondary | ICD-10-CM | POA: Diagnosis not present

## 2016-06-16 ENCOUNTER — Encounter: Payer: Self-pay | Admitting: Internal Medicine

## 2016-06-16 ENCOUNTER — Ambulatory Visit (INDEPENDENT_AMBULATORY_CARE_PROVIDER_SITE_OTHER): Payer: Medicare Other | Admitting: Internal Medicine

## 2016-06-16 VITALS — BP 178/82 | HR 60 | Temp 97.0°F | Resp 16 | Ht 72.0 in | Wt 220.6 lb

## 2016-06-16 DIAGNOSIS — G20 Parkinson's disease: Secondary | ICD-10-CM | POA: Diagnosis not present

## 2016-06-16 MED ORDER — CARBIDOPA-LEVODOPA 10-100 MG PO TABS: ORAL_TABLET | ORAL | 5 refills | Status: DC

## 2016-06-16 NOTE — Patient Instructions (Signed)
Parkinson Disease Parkinson disease is a disorder of the central nervous system, which includes the brain and spinal cord. A person with this disease slowly loses the ability to completely control body movements. Within the brain, there is a group of nerve cells (basal ganglia) that help control movement. The basal ganglia are damaged and do not work properly in a person with Parkinson disease. In addition, the basal ganglia produce and use a brain chemical called dopamine. The dopamine chemical sends messages to other parts of the body to control and coordinate body movements. Dopamine levels are low in a person with Parkinson disease. If the dopamine levels are low, then the body does not receive the correct messages it needs to move normally.  CAUSES  The exact reason why the basal ganglia get damaged is not known. Some medical researchers have thought that infection, genes, environment, and certain medicines may contribute to the cause.  SYMPTOMS   An early symptom of Parkinson disease is often an uncontrolled shaking (tremor) of the hands. The tremor will often disappear when the affected hand is consciously used.  As the disease progresses, walking, talking, getting out of a chair, and new movements become more difficult.  Muscles get stiff and movements become slower.  Balance and coordination become harder.  Depression, trouble swallowing, urinary problems, constipation, and sleep problems can occur.  Later in the disease, memory and thought processes may deteriorate. DIAGNOSIS  There are no specific tests to diagnose Parkinson disease. You may be referred to a neurologist for evaluation. Your caregiver will ask about your medical history, symptoms, and perform a physical exam. Blood tests and imaging tests of your brain may be performed to rule out other diseases. The imaging tests may include an MRI or a CT scan. TREATMENT  The goal of treatment is to relieve symptoms. Medicines may be  prescribed once the symptoms become troublesome. Medicine will not stop the progression of the disease, but medicine can make movement and balance better and help control tremors. Speech and occupational therapy may also be prescribed. Sometimes, surgical treatment of the brain can be done in young people. HOME CARE INSTRUCTIONS  Get regular exercise and rest periods during the day to help prevent exhaustion and depression.  If getting dressed becomes difficult, replace buttons and zippers with Velcro and elastic on your clothing.  Take all medicine as directed by your caregiver.  Install grab bars or railings in your home to prevent falls.  Go to speech or occupational therapy as directed.  Keep all follow-up visits as directed by your caregiver. SEEK MEDICAL CARE IF:  Your symptoms are not controlled with your medicine.  You fall.  You have trouble swallowing or choke on your food. MAKE SURE YOU:  Understand these instructions.  Will watch your condition.  Will get help right away if you are not doing well or get worse.

## 2016-06-16 NOTE — Progress Notes (Signed)
[facility]   Leroy Griffin, M.D.    Dyanne CarrelAmanda R. Steffanie Dunnollier, P.A.-C      Terri Piedraourtney Forcucci, P.A.-C  [facility]                [address]                [place], [place] [phone] Telephone [phone] Telefax [phone] Subjective:    Patient ID: Leroy Griffin, male    DOB: 04/12/1946, 70 y.o.   MRN: [id number]  HPI  This nice 70 yo MWM with HTN, HLD, PreDM, Familial/Essential Tremor and Vit D Def returns for re-evaluation for ? of mild parkinsonian sx's. Patient does recount long hx/o of forward stooped posture and tendency for instability with falling.   Medication Sig  . acetaminophen-codeine (TYLENOL #3) 300-30 MG tablet TAKE 1 TABLET BY MOUTH FOUR TIMES DAILY AS NEEDED FOR PAIN  . allopurinol (ZYLOPRIM) 300 MG tablet Take 1/2 to 1 tablet daily as directed for Gout  . atorvastatin (LIPITOR) 20 MG tablet TAKE 1/2 TO 1 TABLET BY MOUTH EVERY DAY FOR CHOLESTEROL  . Cholecalciferol (VITAMIN D PO) Take 8,000 Units by mouth daily.   . diazepam (VALIUM) 5 MG tablet Take 1/2 to 1 tablet 3 x/day if needed for anxiety or muscle spasm  . Flaxseed, Linseed, (FLAX SEED OIL) 1000 MG CAPS Take 1,000 mg by mouth daily.  . magnesium gluconate (MAGONATE) 500 MG tablet Take 500 mg by mouth 2 (two) times daily.  . Omega-3 Fatty Acids (FISH OIL) 1200 MG CPDR Take by mouth at bedtime.   . vitamin B-12 (CYANOCOBALAMIN) 1000 MCG tablet Take 1,000 mcg by mouth daily.  Marland Kitchen. zinc gluconate 50 MG tablet Take 50 mg by mouth daily.   Allergies  Allergen Reactions  . Enalapril Rash  . Aspirin     Internal bleeding  . Nsaids     Internal bleeding  . Welchol [Colesevelam Hcl] Diarrhea  . Vasotec [Enalaprilat] Rash   Past Medical History:  Diagnosis Date  . Essential tremor   . Gout   . Hyperlipidemia   . Hypertension   . Prediabetes   . Proteinuria   . Vitamin D deficiency    Review of Systems  10 point systems review negative except  as above..    Objective:   Physical Exam   BP (!) 178/82   Pulse 60   Temp 97 F (36.1 C)   Resp 16   Ht 6' (1.829 m)   Wt 220 lb 9.6 oz (100.1 kg)   BMI 29.92 kg/m   HEENT - Eac's patent. TM's Nl. EOM's full. PERRLA. NasoOroPharynx clear. Neck - supple. Nl Thyroid. Carotids 2+ & No bruits, nodes, JVD Chest - Clear equal BS w/o Rales, rhonchi, wheezes. Cor - Nl HS. RRR w/o sig MGR. PP 1(+). No edema. Abd - No palpable organomegaly, masses or tenderness. BS nl. MS- FROM w/o deformities. ? of a few beats of mild cog-wheeling. Mild disdiadochokinesia. Gait forward stooped and shuffling.(-) neg Glabellar reflex. Expressive Facies.  Reduced arm swing.  Neuro - No obvious Cr N abnormalities. Sensory, motor and Cerebellar functions appear Nl w/o focal abnormalities.    Assessment & Plan:   1. Parkinson disease (HCC)   - carbidopa-levodopa (SINEMET) 10-100 MG tablet; Take 1 tablet 2 to 4  X /day as directed  Dispense: 120 tablet; Refill: 5 - discussed med & SE's and recommended slow titration from 1 up to 2  Then 3 and 4 tabs/daily.  - Discussed importance of stretching and conditioning types exercises.

## 2016-06-26 ENCOUNTER — Other Ambulatory Visit: Payer: Self-pay | Admitting: Internal Medicine

## 2016-06-26 DIAGNOSIS — F419 Anxiety disorder, unspecified: Secondary | ICD-10-CM

## 2016-07-21 ENCOUNTER — Ambulatory Visit (INDEPENDENT_AMBULATORY_CARE_PROVIDER_SITE_OTHER): Payer: Medicare Other | Admitting: Internal Medicine

## 2016-07-21 VITALS — BP 130/72 | HR 64 | Temp 97.7°F | Resp 16 | Ht 72.0 in | Wt 221.2 lb

## 2016-07-21 DIAGNOSIS — G20 Parkinson's disease: Secondary | ICD-10-CM | POA: Diagnosis not present

## 2016-07-21 DIAGNOSIS — I10 Essential (primary) hypertension: Secondary | ICD-10-CM

## 2016-07-21 DIAGNOSIS — R809 Proteinuria, unspecified: Secondary | ICD-10-CM | POA: Diagnosis not present

## 2016-07-23 ENCOUNTER — Encounter: Payer: Self-pay | Admitting: Internal Medicine

## 2016-07-23 MED ORDER — CARBIDOPA-LEVODOPA 25-250 MG PO TABS: 1.0000 | ORAL_TABLET | Freq: Four times a day (QID) | ORAL | 2 refills | Status: DC

## 2016-07-23 NOTE — Progress Notes (Signed)
[facility]   Lucky CowboyWilliam Joydan Gretzinger, M.D.    Dyanne CarrelAmanda R. Steffanie Dunnollier, P.A.-C      Terri Piedraourtney Forcucci, P.A.-C  [facility]                [address]                [place], [place] [phone] Telephone [phone] Telefax [phone]  Subjective:    Patient ID: Leroy StageHeino W Griffin, male    DOB: 07/25/1946, 70 y.o.   MRN: [id number]  HPI       This nice 70 yo MWM has hx/o  HTN, HLD, PreDM, Familial/Essential Tremor and Vit D Def returns for re-evaluation for mild parkinson's. Patient was started on low dose Sinemet 10/100 which he is taking 2 x/day w/o any apparent side effects, but also notes no significant change in his sx's which are bradykinesia and sl festinating stooped gait.   Medication Sig  . acetaminophen-codeine (TYLENOL #3) 300-30 MG tablet TAKE 1 TABLET BY MOUTH FOUR TIMES DAILY AS NEEDED FOR PAIN  . allopurinol (ZYLOPRIM) 300 MG tablet Take 1/2 to 1 tablet daily as directed for Gout  . atorvastatin (LIPITOR) 20 MG tablet TAKE 1/2 TO 1 TABLET BY MOUTH EVERY DAY FOR CHOLESTEROL  . carbidopa-levodopa (SINEMET) 10-100 MG tablet Take 1 tablet 2 to 4  X /day as directed  . Cholecalciferol (VITAMIN D PO) Take 8,000 Units by mouth daily.   . diazepam (VALIUM) 5 MG tablet TAKE 1/2-1 TABLET BY MOUTH THREE TIMES DAILY AS NEEDED FOR ANXIETY OR MUSCLE SPASMS  . Flaxseed, Linseed, (FLAX SEED OIL) 1000 MG CAPS Take 1,000 mg by mouth daily.  . magnesium gluconate (MAGONATE) 500 MG tablet Take 500 mg by mouth 2 (two) times daily.  . Omega-3 Fatty Acids (FISH OIL) 1200 MG CPDR Take by mouth at bedtime.   Marland Kitchen. OVER THE COUNTER MEDICATION Tumeric 1 tablet daily.  . vitamin B-12 (CYANOCOBALAMIN) 1000 MCG tablet Take 1,000 mcg by mouth daily.  Marland Kitchen. zinc gluconate 50 MG tablet Take 50 mg by mouth daily.   No facility-administered medications prior to visit.    Allergies  Allergen Reactions  . Enalapril Rash  . Aspirin     Internal bleeding  .  Nsaids     Internal bleeding  . Welchol [Colesevelam Hcl] Diarrhea  . Vasotec [Enalaprilat] Rash   Past Medical History:  Diagnosis Date  . Essential tremor   . Gout   . Hyperlipidemia   . Hypertension   . Prediabetes   . Proteinuria   . Vitamin D deficiency    Review of Systems 10 point systems review negative except as above.    Objective:   Physical Exam  BP 130/72   Pulse 64   Temp 97.7 F (36.5 C)   Resp 16   Ht 6' (1.829 m)   Wt 221 lb 3.2 oz (100.3 kg)   BMI 30.00 kg/m   Expressive facies.  HEENT - Eac's patent. TM's Nl. EOM's full. PERRLA. NasoOroPharynx clear. Neck - supple. Nl Thyroid. Carotids 2+ & No bruits, nodes, JVD Chest - Clear equal BS w/o Rales, rhonchi, wheezes. Cor - Nl HS. RRR w/o sig MGR. PP 1(+). No edema. MS- FROM w/o deformities. Muscle power, tone and bulk Nl.  A few beats of cog wheeling at the elbows. Ambulates stooped forward w/slow and sl shuffling  broad based gait. Arm swing is reduced.  Neuro - No obvious Cr N abnormalities. Sensory,  motor and Cerebellar functions appear Nl w/o focal abnormalities.    Assessment & Plan:   1. Parkinson disease (HCC)  -discussed increasing Sinemet 10/100 to tid x 1 week and then to bid (Bkfst and lunch) & 2 tabs at supper for 2sd week and given new Rx- Sinemet 25/250 to then transition to tid w/ meals.  - ROV 6 wks - discussed meds /SE's.  2. Essential hypertension

## 2016-07-23 NOTE — Patient Instructions (Signed)
Parkinson Disease °Parkinson disease is a long-term (chronic) condition that gets worse over time (is progressive). Parkinson disease limits your ability to control your movements and move your body normally. This condition is a type of movement disorder. °Each person with Parkinson disease is affected differently. The condition can range from mild to severe. Parkinson disease tends to progress slowly over several years. °What are the causes? °Parkinson disease results from a loss of brain cells (neurons) in a specific part of the brain (substantia nigra). Some of the neurons in the substantia nigra make an important brain chemical (dopamine). Dopamine is needed to control movement. As the condition gets worse, neurons make less dopamine. This makes it hard to move or control your movements. °The exact cause of why neurons are lost or produce less dopamine is not known. Genetic and environmental factors may contribute to the cause of Parkinson disease. °What increases the risk? °This condition is more likely to develop in: °· Men. °· People who are 60 years of age or older. °· People who have a family history of Parkinson disease. °What are the signs or symptoms? °Symptoms of this condition can vary from person to person. The main (primary) symptoms are related to movement (motor symptoms). These include: °· Uncontrolled shaking movements (tremor). Tremors usually start in a hand or foot when you are resting (resting tremor). The tremor may stop when you move around. °· Slowing of movement. You may lose facial expression and have trouble making the small movements that are needed to button clothing or brush your teeth. You may walk with short, shuffling steps. °· Stiff movement (rigidity). This mostly affects your arms, legs, neck, and upper body. You may walk without swinging your arms. Rigidity can be painful. °· Loss of balance and stability when standing. You may sway, fall backward, and have trouble making  turns. °Secondary motor symptoms of this condition include: °· Shrinking handwriting. °· Stooped posture. °· Slowed speech. °· Trouble swallowing. °· Drooling. °· Sexual dysfunction. °· Muscle cramps. °· Loss of smell. °Additional symptoms that are not related to movement include: °· Constipation. °· Mood swings. °· Depression or anxiety. °· Sleep disturbances. °· Confusion. °· Loss of mental abilities (dementia). °· Low blood pressure. °· Trouble concentrating. °How is this diagnosed? °Parkinson disease can be hard to diagnose in its early stages. A diagnosis may be made based on symptoms, a medical history, and physical exam. During your exam, your health care provider will look for: °· Lack of facial expression. °· Resting tremor. °· Stiffness in your neck, arms, and legs. °· Abnormal walk. °· Trouble with balance. °You may have brain imaging tests done to check for a loss of dopamine-producing areas of the brain. Your healthcare provider may also grade the severity of your condition as mild, moderate, or advanced. Parkinson disease progression is different for everyone. You may not progress to the advanced stage. °Mild Parkinson disease involves: °· Movement problems that do not affect daily activities. °· Movement problems on one side of the body. °· Movement problems that are controlled with medicines. °· Good response to exercise. °Moderate Parkinson disease involves: °· Movement problems on both sides of the body. °· Slowing of movement. °· Coordination and balance problems. °· Less of a response to medicine. °· More side effects from medicines. °Advanced Parkinson disease involves: °· Extreme difficulty walking. °· Inability to live alone safely. °· Signs of dementia. °· Difficulty controlling symptoms with medicine. °How is this treated? °There is no cure for Parkinson   disease. Treatment focuses on relieving your symptoms. Treatment may include: °· Medicines. °· Speech, occupational, and physical  therapy. °· Surgery. °Everyone responds to medicines differently. Your response may change over time. Work with your health care provider to find the best medicines for you. These may include: °· Dopamine replacement drugs. These are the most effective medicines. A long-term side effect of these medicines is uncontrolled movements (dyskinesias). °· Dopamine agonists. These drugs act like dopamine to stimulate dopamine receptors in the brain. Side effects include nausea and sleepiness, but they cause less dyskinesia. °· Other medicines to reduce tremor, prevent dopamine breakdown, reduce dyskinesia, and reduce dementia that is related to Parkinson disease. °Another treatment is deep brain stimulation surgery to reduce tremors and dyskinesia. This procedure involves placing electrodes in the brain. The electrodes are attached to an electric pulse generator that acts like a pacemaker for your brain. This may be an option if you have had the condition for at least four years and are not responding well to medicines. °Follow these instructions at home: °· Take over-the-counter and prescription medicines only as told by your health care provider. °· Install grab bars and railings in your home to prevent falls. °· Follow instructions from your health care provider about eating or drinking restrictions. °· Return to your normal activities as told by your health care provider. Ask your health care provider what activities are safe for you. °· Get regular exercise as told by your health care provider or a physical therapist. °· Keep all follow-up visits as told by your health care provider. This is important. These include any visits with a speech therapist or occupational therapist. °· Consider joining a support group for people with Parkinson disease. °Contact a health care provider if: °· Medicines do not help your symptoms. °· You are unsteady or have fallen at home. °· You need more support to function well at  home. °· You have trouble swallowing. °· You have severe constipation. °· You are struggling with side effects from your medicines. °· You see or hear things that are not real (hallucinate). °· You feel confused, anxious, or depressed. °Get help right away if: °· You are injured after a fall. °· You cannot swallow without choking. °· You have chest pain or trouble breathing. °· You do not feel safe at home. °This information is not intended to replace advice given to you by your health care provider. Make sure you discuss any questions you have with your health care provider. °Document Released: 07/22/2000 Document Revised: 12/28/2015 Document Reviewed: 05/15/2015 °Elsevier Interactive Patient Education © 2017 [employer] ° °

## 2016-07-25 DIAGNOSIS — I129 Hypertensive chronic kidney disease with stage 1 through stage 4 chronic kidney disease, or unspecified chronic kidney disease: Secondary | ICD-10-CM | POA: Diagnosis not present

## 2016-07-25 DIAGNOSIS — Z6830 Body mass index (BMI) 30.0-30.9, adult: Secondary | ICD-10-CM | POA: Diagnosis not present

## 2016-07-25 DIAGNOSIS — E785 Hyperlipidemia, unspecified: Secondary | ICD-10-CM | POA: Diagnosis not present

## 2016-07-25 DIAGNOSIS — R809 Proteinuria, unspecified: Secondary | ICD-10-CM | POA: Diagnosis not present

## 2016-08-15 ENCOUNTER — Other Ambulatory Visit: Payer: Self-pay | Admitting: Internal Medicine

## 2016-08-15 DIAGNOSIS — I10 Essential (primary) hypertension: Secondary | ICD-10-CM

## 2016-08-24 ENCOUNTER — Ambulatory Visit: Payer: Self-pay | Admitting: Internal Medicine

## 2016-09-01 ENCOUNTER — Other Ambulatory Visit: Payer: Self-pay | Admitting: *Deleted

## 2016-09-01 ENCOUNTER — Ambulatory Visit (INDEPENDENT_AMBULATORY_CARE_PROVIDER_SITE_OTHER): Payer: Medicare Other | Admitting: Internal Medicine

## 2016-09-01 ENCOUNTER — Encounter: Payer: Self-pay | Admitting: Internal Medicine

## 2016-09-01 VITALS — BP 120/82 | HR 72 | Temp 97.5°F | Resp 16 | Ht 72.0 in | Wt 226.8 lb

## 2016-09-01 DIAGNOSIS — M21611 Bunion of right foot: Secondary | ICD-10-CM

## 2016-09-01 DIAGNOSIS — Z79899 Other long term (current) drug therapy: Secondary | ICD-10-CM | POA: Diagnosis not present

## 2016-09-01 DIAGNOSIS — E559 Vitamin D deficiency, unspecified: Secondary | ICD-10-CM | POA: Diagnosis not present

## 2016-09-01 DIAGNOSIS — G20 Parkinson's disease: Secondary | ICD-10-CM

## 2016-09-01 DIAGNOSIS — M1000 Idiopathic gout, unspecified site: Secondary | ICD-10-CM

## 2016-09-01 DIAGNOSIS — R7303 Prediabetes: Secondary | ICD-10-CM | POA: Diagnosis not present

## 2016-09-01 DIAGNOSIS — E782 Mixed hyperlipidemia: Secondary | ICD-10-CM | POA: Diagnosis not present

## 2016-09-01 DIAGNOSIS — I10 Essential (primary) hypertension: Secondary | ICD-10-CM

## 2016-09-01 DIAGNOSIS — G20A1 Parkinson's disease without dyskinesia, without mention of fluctuations: Secondary | ICD-10-CM

## 2016-09-01 LAB — CBC WITH DIFFERENTIAL/PLATELET
Basophils Absolute: 44 cells/uL (ref 0–200)
Basophils Relative: 1 %
Eosinophils Absolute: 132 cells/uL (ref 15–500)
Eosinophils Relative: 3 %
HCT: 48.1 % (ref 38.5–50.0)
Hemoglobin: 16.1 g/dL (ref 13.2–17.1)
Lymphocytes Relative: 45 %
Lymphs Abs: 1980 cells/uL (ref 850–3900)
MCH: 32.7 pg (ref 27.0–33.0)
MCHC: 33.5 g/dL (ref 32.0–36.0)
MCV: 97.6 fL (ref 80.0–100.0)
MPV: 9.5 fL (ref 7.5–12.5)
Monocytes Absolute: 528 cells/uL (ref 200–950)
Monocytes Relative: 12 %
Neutro Abs: 1716 cells/uL (ref 1500–7800)
Neutrophils Relative %: 39 %
Platelets: 126 10*3/uL — ABNORMAL LOW (ref 140–400)
RBC: 4.93 MIL/uL (ref 4.20–5.80)
RDW: 14.6 % (ref 11.0–15.0)
WBC: 4.4 10*3/uL (ref 3.8–10.8)

## 2016-09-01 LAB — TSH: TSH: 1.93 mIU/L (ref 0.40–4.50)

## 2016-09-01 MED ORDER — CLOBETASOL PROPIONATE 0.05 % EX OINT
1.0000 | TOPICAL_OINTMENT | Freq: Two times a day (BID) | CUTANEOUS | 3 refills | Status: DC
Start: 2016-09-01 — End: 2016-09-02

## 2016-09-01 MED ORDER — CARBIDOPA-LEVODOPA 25-250 MG PO TABS: ORAL_TABLET | ORAL | 1 refills | Status: DC

## 2016-09-01 MED ORDER — CARBIDOPA-LEVODOPA 25-250 MG PO TABS: 1.0000 | ORAL_TABLET | Freq: Four times a day (QID) | ORAL | 2 refills | Status: DC

## 2016-09-01 NOTE — Progress Notes (Signed)
[facility] Lucky Cowboy, M.D.        Dyanne Carrel. Steffanie Dunn, P.A.-C       Terri Piedra, P.A.-C  [facility]                [address]                [place], [place]. [address] Telephone [phone] Telefax [phone] ______________________________________________________________________     This very nice 71 y.o. 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 Other significant hx is that of hospitalizations x 2 in 1986 and 1990 from NSAID induced bleeding Duodenal ulcers.     Patient also has concerns re: a painful bunion of the medial Rt 2sd toe not responding to OTC local treatments and requests referral.     Recently in Nov 2017 started on low dose of Sinemet for parkinsonian sn's and very slowly titrated  from 2 up to 4 tablets/day and he reports no perceivable improvement in his sx's manifest as poor balance and shuffling gait. Patient has long standing hx/o of hereditary action type tremor and recently in last fall was noted to have a progressive worsening of his gait. He was given Rx Sinemet 25/250 at OV 1 month ago and has not yet started that higher dose. Also he feels his balance is worse and he has started using a cane for gait stability. Reports worsening macrographia.      Patient is treated for HTN & BP has been controlled at home. Today's BP is at goal - 120/82.  Cardiopulmonary testing and Cardiac MRI  in Feb 2017 per Dr Graciela Husbands were low risk for ischemic CAD.  Patient also has hx/o idiopathic proteinuria with CKD2 (GFR 86) followed by Dr Julien Girt. Patient has had no complaints of any cardiac type chest pain, palpitations, dyspnea/orthopnea/PND, dizziness, claudication, or dependent  edema.     Hyperlipidemia is controlled with diet & meds. Patient denies myalgias or other med SE's. Last Lipids were  Lab Results  Component Value Date   CHOL 195 05/19/2016   HDL 57 05/19/2016   LDLCALC 102 05/19/2016   TRIG 178 (H) 05/19/2016   CHOLHDL 3.4 05/19/2016      Also, the patient has history of PreDiabetes and has had no symptoms of reactive hypoglycemia, diabetic polys, paresthesias or visual blurring.  Last A1c was  Lab Results  Component Value Date   HGBA1C 5.2 05/19/2016      Further, the patient also has history of Vitamin D Deficiency and supplements vitamin D without any suspected side-effects. Last vitamin D was   Lab Results  Component Value Date   VD25OH 74 02/16/2016   Current Outpatient Prescriptions on File Prior to Visit  Medication Sig  . acetaminophen-codeine (TYLENOL #3) 300-30 MG tablet Take by mouth.  Marland Kitchen allopurinol (ZYLOPRIM) 300 MG tablet Take 1/2 to 1 tablet daily as directed for Gout  . atorvastatin (LIPITOR) 20 MG tablet TAKE 1/2 TO 1 TABLET BY MOUTH EVERY DAY FOR CHOLESTEROL  . Cholecalciferol (VITAMIN D PO) Take 8,000 Units by mouth daily.   . diazepam (VALIUM) 5 MG tablet TAKE 1/2-1 TABLET BY MOUTH THREE TIMES DAILY AS NEEDED FOR ANXIETY OR MUSCLE SPASMS  . Flaxseed, Linseed, (FLAX SEED OIL) 1000 MG CAPS Take 1,000 mg  by mouth daily.  Marland Kitchen. losartan (COZAAR) 100 MG tablet TAKE 1/2 TABLET BY MOUTH TWICE DAILY  . magnesium gluconate (MAGONATE) 500 MG tablet Take 500 mg by mouth 2 (two) times daily.  . Omega-3 Fatty Acids (FISH OIL) 1200 MG CPDR Take by mouth at bedtime.   Marland Kitchen. OVER THE COUNTER MEDICATION Tumeric 1 tablet daily.  . vitamin B-12 (CYANOCOBALAMIN) 1000 MCG tablet Take 1,000 mcg by mouth daily.  Marland Kitchen. zinc gluconate 50 MG tablet Take 50 mg by mouth daily.   No current facility-administered medications on file prior to visit.    Allergies  Allergen Reactions  . Enalapril Rash  . Aspirin     Internal bleeding  . Nsaids     Internal  bleeding  . Welchol [Colesevelam Hcl] Diarrhea  . Vasotec [Enalaprilat] Rash   PMHx:   Past Medical History:  Diagnosis Date  . Essential tremor   . Gout   . Hyperlipidemia   . Hypertension   . Prediabetes   . Proteinuria   . Vitamin D deficiency    Immunization History  Administered Date(s) Administered  . Influenza Split 05/23/2013  . Influenza, High Dose Seasonal PF 05/14/2014, 06/11/2015, 05/19/2016  . Pneumococcal Conjugate-13 12/03/2014  . Pneumococcal Polysaccharide-23 06/01/2010  . Td 06/01/2010  . Tdap 01/28/2014  . Zoster 08/08/2004   Past Surgical History:  Procedure Laterality Date  . Rt knee     FHx:    Reviewed / unchanged  SHx:    Reviewed / unchanged  Systems Review:  Constitutional: Denies fever, chills, wt changes, headaches, insomnia, fatigue, night sweats, change in appetite. Eyes: Denies redness, blurred vision, diplopia, discharge, itchy, watery eyes.  ENT: Denies discharge, congestion, post nasal drip, epistaxis, sore throat, earache, hearing loss, dental pain, tinnitus, vertigo, sinus pain, snoring.  CV: Denies chest pain, palpitations, irregular heartbeat, syncope, dyspnea, diaphoresis, orthopnea, PND, claudication or edema. Respiratory: denies cough, dyspnea, DOE, pleurisy, hoarseness, laryngitis, wheezing.  Gastrointestinal: Denies dysphagia, odynophagia, heartburn, reflux, water brash, abdominal pain or cramps, nausea, vomiting, bloating, diarrhea, constipation, hematemesis, melena, hematochezia  or hemorrhoids. Genitourinary: Denies dysuria, frequency, urgency, nocturia, hesitancy, discharge, hematuria or flank pain. Musculoskeletal: c/o arthralgias of hands. No myalgias, some stiffness, no jt. swelling. Skin: Denies pruritus, rash, hives, warts, acne, eczema or change in skin lesion(s). Neuro: Asbabove Psychiatric: Denies confusion, memory loss or sensory loss. Endo: Denies change in weight, skin or hair change.  Heme/Lymph: No excessive  bleeding, bruising or enlarged lymph nodes.  Physical Exam  BP 120/82   P 72   T 97.5 F   R 16   Ht 6'    Wt 226 lb 12.8 oz    BMI 30.76   Appears over nourished and in no distress.  Eyes: PERRLA, EOMs, conjunctiva no swelling or erythema. Sinuses: No frontal/maxillary tenderness ENT/Mouth: EAC's clear, TM's nl w/o erythema, bulging. Nares clear w/o erythema, swelling, exudates. Oropharynx clear without erythema or exudates. Oral hygiene is good. Tongue normal, non obstructing. Hearing intact.  Neck: Supple. Thyroid nl. Car 2+/2+ without bruits, nodes or JVD. Chest: Respirations nl with BS clear & equal w/o rales, rhonchi, wheezing or stridor.  Cor: Heart sounds normal w/ regular rate and rhythm without sig. murmurs, gallops, clicks, or rubs. Peripheral pulses normal and equal  without edema.  Abdomen: Soft & bowel sounds normal. Non-tender w/o guarding, rebound, hernias, masses, or organomegaly.  Lymphatics: Unremarkable.  Musculoskeletal: FROM w/o deformities.  Mild /subtle cog wheeling. Mild shuffling stooped gait with reduced arm swing. Tender bunion  of medial Rt 2sd toe. Skin: Warm, dry without exposed rashes, lesions or ecchymosis apparent.  Neuro: Cranial nerves intact. Expressive facies.Tendon reflexes flat thru-out. Sensory-motor testing grossly intact. Mild dysdiadochokinesis. Neg glabellar.   Assessment and Plan:  1. Essential hypertension  - Continue medication, monitor blood pressure at home.  - Continue DASH diet. Reminder to go to the ER if any CP,  SOB, nausea, dizziness, severe HA, changes vision/speech,  left arm numbness and tingling and jaw pain. - CBC with Differential/Platelet - BASIC METABOLIC PANEL WITH GFR - TSH  2. Hyperlipidemia  - Continue diet/meds, exercise,& lifestyle modifications.  - Continue monitor periodic cholesterol/liver & renal functions  - Hepatic function panel - Lipid panel - TSH  3. Prediabetes  - Continue diet, exercise,  lifestyle modifications.  - Monitor appropriate labs. - Hemoglobin A1c - Insulin, random  4. Vitamin D deficiency  - Continue supplementation. -VITAMIN D 25 Hydroxy   5. Parkinson disease (HCC)  - carbidopa-levodopa (SINEMET) 25-250 MG tablet; Take 1 tablet by mouth 4 (four) times daily.  Dispense:120 tablet; Refill: 2  - Ambulatory referral to Neurology  6. Idiopathic gout  - Uric acid  7. Medication management  - CBC with Differential/Platelet - BASIC METABOLIC PANEL WITH GFR - Hepatic function panel - Magnesium - Lipid panel - VITAMIN D 25 Hydroxy  - Uric acid  8. Bunion, Rt 2sd toe  - Referral to Orthopedics       Recommended regular exercise, BP monitoring, weight control, and discussed med and SE's. Recommended labs to assess and monitor clinical status. Further disposition pending results of labs. Over 30 minutes of exam, counseling, chart review was performed

## 2016-09-01 NOTE — Patient Instructions (Signed)
Parkinson Disease °Parkinson disease is a long-term (chronic) condition that gets worse over time (is progressive). Parkinson disease limits your ability to control your movements and move your body normally. This condition is a type of movement disorder. °Each person with Parkinson disease is affected differently. The condition can range from mild to severe. Parkinson disease tends to progress slowly over several years. °What are the causes? °Parkinson disease results from a loss of brain cells (neurons) in a specific part of the brain (substantia nigra). Some of the neurons in the substantia nigra make an important brain chemical (dopamine). Dopamine is needed to control movement. As the condition gets worse, neurons make less dopamine. This makes it hard to move or control your movements. °The exact cause of why neurons are lost or produce less dopamine is not known. Genetic and environmental factors may contribute to the cause of Parkinson disease. °What increases the risk? °This condition is more likely to develop in: °· Men. °· People who are 60 years of age or older. °· People who have a family history of Parkinson disease. °What are the signs or symptoms? °Symptoms of this condition can vary from person to person. The main (primary) symptoms are related to movement (motor symptoms). These include: °· Uncontrolled shaking movements (tremor). Tremors usually start in a hand or foot when you are resting (resting tremor). The tremor may stop when you move around. °· Slowing of movement. You may lose facial expression and have trouble making the small movements that are needed to button clothing or brush your teeth. You may walk with short, shuffling steps. °· Stiff movement (rigidity). This mostly affects your arms, legs, neck, and upper body. You may walk without swinging your arms. Rigidity can be painful. °· Loss of balance and stability when standing. You may sway, fall backward, and have trouble making  turns. °Secondary motor symptoms of this condition include: °· Shrinking handwriting. °· Stooped posture. °· Slowed speech. °· Trouble swallowing. °· Drooling. °· Sexual dysfunction. °· Muscle cramps. °· Loss of smell. °Additional symptoms that are not related to movement include: °· Constipation. °· Mood swings. °· Depression or anxiety. °· Sleep disturbances. °· Confusion. °· Loss of mental abilities (dementia). °· Low blood pressure. °· Trouble concentrating. °How is this diagnosed? °Parkinson disease can be hard to diagnose in its early stages. A diagnosis may be made based on symptoms, a medical history, and physical exam. During your exam, your health care provider will look for: °· Lack of facial expression. °· Resting tremor. °· Stiffness in your neck, arms, and legs. °· Abnormal walk. °· Trouble with balance. °You may have brain imaging tests done to check for a loss of dopamine-producing areas of the brain. Your healthcare provider may also grade the severity of your condition as mild, moderate, or advanced. Parkinson disease progression is different for everyone. You may not progress to the advanced stage. °Mild Parkinson disease involves: °· Movement problems that do not affect daily activities. °· Movement problems on one side of the body. °· Movement problems that are controlled with medicines. °· Good response to exercise. °Moderate Parkinson disease involves: °· Movement problems on both sides of the body. °· Slowing of movement. °· Coordination and balance problems. °· Less of a response to medicine. °· More side effects from medicines. °Advanced Parkinson disease involves: °· Extreme difficulty walking. °· Inability to live alone safely. °· Signs of dementia. °· Difficulty controlling symptoms with medicine. °How is this treated? °There is no cure for Parkinson   disease. Treatment focuses on relieving your symptoms. Treatment may include:  Medicines.  Speech, occupational, and physical  therapy.  Surgery. Everyone responds to medicines differently. Your response may change over time. Work with your health care provider to find the best medicines for you. These may include:  Dopamine replacement drugs. These are the most effective medicines. A long-term side effect of these medicines is uncontrolled movements (dyskinesias).  Dopamine agonists. These drugs act like dopamine to stimulate dopamine receptors in the brain. Side effects include nausea and sleepiness, but they cause less dyskinesia.  Other medicines to reduce tremor, prevent dopamine breakdown, reduce dyskinesia, and reduce dementia that is related to Parkinson disease. Another treatment is deep brain stimulation surgery to reduce tremors and dyskinesia. This procedure involves placing electrodes in the brain. The electrodes are attached to an electric pulse generator that acts like a pacemaker for your brain. This may be an option if you have had the condition for at least four years and are not responding well to medicines. Follow these instructions at home:  Take over-the-counter and prescription medicines only as told by your health care provider.  Install grab bars and railings in your home to prevent falls.  Follow instructions from your health care provider about eating or drinking restrictions.  Return to your normal activities as told by your health care provider. Ask your health care provider what activities are safe for you.  Get regular exercise as told by your health care provider or a physical therapist.  Keep all follow-up visits as told by your health care provider. This is important. These include any visits with a speech therapist or occupational therapist.  Consider joining a support group for people with Parkinson disease. Contact a health care provider if:  Medicines do not help your symptoms.  You are unsteady or have fallen at home.  You need more support to function well at  home.  You have trouble swallowing.  You have severe constipation.  You are struggling with side effects from your medicines.  You see or hear things that are not real (hallucinate).  You feel confused, anxious, or depressed. Get help right away if:  You are injured after a fall.  You cannot swallow without choking.  You have chest pain or trouble breathing.  You do not feel safe at home. This information is not intended to replace advice given to you by your health care provider. Make sure you discuss any questions you have with your health care provider. Document Released: 07/22/2000 Document Revised: 12/28/2015 Document Reviewed: 05/15/2015 Elsevier Interactive Patient Education  2017 [employer]  ++++++++++++++++++++++++++++++++++ Recommend Adult Low Dose Aspirin or  coated  Aspirin 81 mg daily  To reduce risk of Colon Cancer 20 %,  Skin Cancer 26 % ,  Melanoma 46%  and  Pancreatic cancer 60% +++++++++++++++++++++++++ Vitamin D goal  is between 70-100.  Please make sure that you are taking your Vitamin D as directed.  It is very important as a natural anti-inflammatory  helping hair, skin, and nails, as well as reducing stroke and heart attack risk.  It helps your bones and helps with mood. It also decreases numerous cancer risks so please take it as directed.  Low Vit D is associated with a 200-300% higher risk for CANCER  and 200-300% higher risk for HEART   ATTACK  &  STROKE.   .....................................Marland Kitchen It is also associated with higher death rate at younger ages,  autoimmune diseases like Rheumatoid arthritis, Lupus, Multiple  Sclerosis.    Also many other serious conditions, like depression, Alzheimer's Dementia, infertility, muscle aches, fatigue, fibromyalgia - just to name a few. ++++++++++++++++++++ Recommend the book "The END of DIETING" by Dr Monico HoarJoel Fuhrman  & the book "The END of DIABETES " by Dr Monico HoarJoel Fuhrman At [email or web address] - get book &  Audio CD's    Being diabetic has a  300% increased risk for heart attack, stroke, cancer, and alzheimer- type vascular dementia. It is very important that you work harder with diet by avoiding all foods that are white. Avoid white rice (brown & wild rice is OK), white potatoes (sweetpotatoes in moderation is OK), White bread or wheat bread or anything made out of white flour like bagels, donuts, rolls, buns, biscuits, cakes, pastries, cookies, pizza crust, and pasta (made from white flour & egg whites) - vegetarian pasta or spinach or wheat pasta is OK. Multigrain breads like Arnold's or Pepperidge [employer], or multigrain sandwich thins or flatbreads.  Diet, exercise and weight loss can reverse and cure diabetes in the early stages.  Diet, exercise and weight loss is very important in the control and prevention of complications of diabetes which affects every system in your body, ie. Brain - dementia/stroke, eyes - glaucoma/blindness, heart - heart attack/heart failure, kidneys - dialysis, stomach - gastric paralysis, intestines - malabsorption, nerves - severe painful neuritis, circulation - gangrene & loss of a leg(s), and finally cancer and Alzheimers.    I recommend avoid fried & greasy foods,  sweets/candy, white rice (brown or wild rice or Quinoa is OK), white potatoes (sweet potatoes are OK) - anything made from white flour - bagels, doughnuts, rolls, buns, biscuits,white and wheat breads, pizza crust and traditional pasta made of white flour & egg white(vegetarian pasta or spinach or wheat pasta is OK).  Multi-grain bread is OK - like multi-grain flat bread or sandwich thins. Avoid alcohol in excess. Exercise is also important.    Eat all the vegetables you want - avoid meat, especially red meat and dairy - especially cheese.  Cheese is the most concentrated form of trans-fats which is the worst thing to clog up our arteries. Veggie cheese is OK which can be found in the fresh produce section at  [employer] or [employer] or [employer]  +++++++++++++++++++++ DASH Eating Plan  DASH stands for "Dietary Approaches to Stop Hypertension."   The DASH eating plan is a healthy eating plan that has been shown to reduce high blood pressure (hypertension). Additional health benefits may include reducing the risk of type 2 diabetes mellitus, heart disease, and stroke. The DASH eating plan may also help with weight loss. WHAT DO I NEED TO KNOW ABOUT THE DASH EATING PLAN? For the DASH eating plan, you will follow these general guidelines:  Choose foods with a percent daily value for sodium of less than 5% (as listed on the food label).  Use salt-free seasonings or herbs instead of table salt or sea salt.  Check with your health care provider or pharmacist before using salt substitutes.  Eat lower-sodium products, often labeled as "lower sodium" or "no salt added."  Eat fresh foods.  Eat more vegetables, fruits, and low-fat dairy products.  Choose whole grains. Look for the word "whole" as the first word in the ingredient list.  Choose fish   Limit sweets, desserts, sugars, and sugary drinks.  Choose heart-healthy fats.  Eat veggie cheese   Eat more home-cooked food and less restaurant, buffet, and fast food.  Limit fried  foods.  Adriana Simas foods using methods other than frying.  Limit canned vegetables. If you do use them, rinse them well to decrease the sodium.  When eating at a restaurant, ask that your food be prepared with less salt, or no salt if possible.                      WHAT FOODS CAN I EAT? Read Dr Francis Dowse Fuhrman's books on The End of Dieting & The End of Diabetes  Grains Whole grain or whole wheat bread. Brown rice. Whole grain or whole wheat pasta. Quinoa, bulgur, and whole grain cereals. Low-sodium cereals. Corn or whole wheat flour tortillas. Whole grain cornbread. Whole grain crackers. Low-sodium crackers.  Vegetables Fresh or frozen vegetables (raw, steamed,  roasted, or grilled). Low-sodium or reduced-sodium tomato and vegetable juices. Low-sodium or reduced-sodium tomato sauce and paste. Low-sodium or reduced-sodium canned vegetables.   Fruits All fresh, canned (in natural juice), or frozen fruits.  Protein Products  All fish and seafood.  Dried beans, peas, or lentils. Unsalted nuts and seeds. Unsalted canned beans.  Dairy Low-fat dairy products, such as skim or 1% milk, 2% or reduced-fat cheeses, low-fat ricotta or cottage cheese, or plain low-fat yogurt. Low-sodium or reduced-sodium cheeses.  Fats and Oils Tub margarines without trans fats. Light or reduced-fat mayonnaise and salad dressings (reduced sodium). Avocado. Safflower, olive, or canola oils. Natural peanut or almond butter.  Other Unsalted popcorn and pretzels. The items listed above may not be a complete list of recommended foods or beverages. Contact your dietitian for more options.  +++++++++++++++  WHAT FOODS ARE NOT RECOMMENDED? Grains/ White flour or wheat flour White bread. White pasta. White rice. Refined cornbread. Bagels and croissants. Crackers that contain trans fat.  Vegetables  Creamed or fried vegetables. Vegetables in a . Regular canned vegetables. Regular canned tomato sauce and paste. Regular tomato and vegetable juices.  Fruits Dried fruits. Canned fruit in light or heavy syrup. Fruit juice.  Meat and Other Protein Products Meat in general - RED meat & White meat.  Fatty cuts of meat. Ribs, chicken wings, all processed meats as bacon, sausage, bologna, salami, fatback, hot dogs, bratwurst and packaged luncheon meats.  Dairy Whole or 2% milk, cream, half-and-half, and cream cheese. Whole-fat or sweetened yogurt. Full-fat cheeses or blue cheese. Non-dairy creamers and whipped toppings. Processed cheese, cheese spreads, or cheese curds.  Condiments Onion and garlic salt, seasoned salt, table salt, and sea salt. Canned and packaged gravies.  Worcestershire sauce. Tartar sauce. Barbecue sauce. Teriyaki sauce. Soy sauce, including reduced sodium. Steak sauce. Fish sauce. Oyster sauce. Cocktail sauce. Horseradish. Ketchup and mustard. Meat flavorings and tenderizers. Bouillon cubes. Hot sauce. Tabasco sauce. Marinades. Taco seasonings. Relishes.  Fats and Oils Butter, stick margarine, lard, shortening and bacon fat. Coconut, palm kernel, or palm oils. Regular salad dressings.  Pickles and olives. Salted popcorn and pretzels.  The items listed above may not be a complete list of foods and beverages to avoid.

## 2016-09-02 ENCOUNTER — Other Ambulatory Visit: Payer: Self-pay | Admitting: *Deleted

## 2016-09-02 LAB — BASIC METABOLIC PANEL WITH GFR
BUN: 17 mg/dL (ref 7–25)
CO2: 25 mmol/L (ref 20–31)
Calcium: 10.2 mg/dL (ref 8.6–10.3)
Chloride: 102 mmol/L (ref 98–110)
Creat: 1.04 mg/dL (ref 0.70–1.18)
GFR, Est African American: 83 mL/min (ref >=60)
GFR, Est Non African American: 71 mL/min (ref >=60)
Glucose, Bld: 90 mg/dL (ref 65–99)
Potassium: 4.6 mmol/L (ref 3.5–5.3)
Sodium: 140 mmol/L (ref 135–146)

## 2016-09-02 LAB — LIPID PANEL
Cholesterol: 204 mg/dL — ABNORMAL HIGH (ref <200)
HDL: 53 mg/dL (ref >40)
LDL Cholesterol: 115 mg/dL — ABNORMAL HIGH (ref <100)
Total CHOL/HDL Ratio: 3.8 Ratio (ref <5.0)
Triglycerides: 181 mg/dL — ABNORMAL HIGH (ref <150)
VLDL: 36 mg/dL — ABNORMAL HIGH (ref <30)

## 2016-09-02 LAB — INSULIN, RANDOM: Insulin: 6.3 u[IU]/mL (ref 2.0–19.6)

## 2016-09-02 LAB — MAGNESIUM: Magnesium: 1.9 mg/dL (ref 1.5–2.5)

## 2016-09-02 LAB — HEPATIC FUNCTION PANEL
ALT: 13 U/L (ref 9–46)
AST: 19 U/L (ref 10–35)
Albumin: 3.8 g/dL (ref 3.6–5.1)
Alkaline Phosphatase: 50 U/L (ref 40–115)
Bilirubin, Direct: 0.2 mg/dL (ref <=0.2)
Indirect Bilirubin: 0.9 mg/dL (ref 0.2–1.2)
Total Bilirubin: 1.1 mg/dL (ref 0.2–1.2)
Total Protein: 6.7 g/dL (ref 6.1–8.1)

## 2016-09-02 LAB — HEMOGLOBIN A1C
Hgb A1c MFr Bld: 5.1 % (ref <5.7)
Mean Plasma Glucose: 100 mg/dL

## 2016-09-02 LAB — URIC ACID: Uric Acid, Serum: 4.9 mg/dL (ref 4.0–8.0)

## 2016-09-02 LAB — VITAMIN D 25 HYDROXY (VIT D DEFICIENCY, FRACTURES): Vit D, 25-Hydroxy: 46 ng/mL (ref 30–100)

## 2016-09-02 MED ORDER — TRIAMCINOLONE ACETONIDE 0.1 % EX OINT: 1.0000 "application " | TOPICAL_OINTMENT | Freq: Three times a day (TID) | CUTANEOUS | 3 refills | Status: DC

## 2016-09-09 ENCOUNTER — Ambulatory Visit (INDEPENDENT_AMBULATORY_CARE_PROVIDER_SITE_OTHER): Payer: Medicare Other | Admitting: Orthopedic Surgery

## 2016-09-09 ENCOUNTER — Encounter (INDEPENDENT_AMBULATORY_CARE_PROVIDER_SITE_OTHER): Payer: Self-pay | Admitting: Orthopedic Surgery

## 2016-09-09 VITALS — Ht 72.0 in | Wt 226.0 lb

## 2016-09-09 DIAGNOSIS — L97511 Non-pressure chronic ulcer of other part of right foot limited to breakdown of skin: Secondary | ICD-10-CM

## 2016-09-09 NOTE — Progress Notes (Signed)
Office Visit Note   Patient: Leroy Griffin           Date of Birth: [birth year]           MRN: [id number] Visit Date: 09/09/2016              Requested by: Lucky Cowboy, MD [address] [place], [place] [address] PCP: Nadean Corwin, MD  Chief Complaint  Patient presents with  . Right Foot - Pain    HPI: Patient complains of a "sore spot" on the right foot second toe. Pt states that he has been using a pad to off load pressure on this are and it has helped a little but the pt states his PCP has advised to come here for eval.  The inside of the toe between the second and great toe there is a small knot that the pt states has been there for years and it is now is painful and without the padding it becomes irritated and states that it has caused a change in his gait. Autumn L Forrest, RMA  Patient states that he is recently been diagnosed with Parkinson's.  Assessment & Plan: Visit Diagnoses:  1. Skin ulcer of second toe, right, limited to breakdown of skin (HCC)     Plan: Discussed with patient there are 2 options we can use padding in the first webspace he was given 2 pads to try which are different than what he is been using. We could also plan for resection of the PIP joint with pinning of the joint with removal of pin in 2 weeks. Discussed the patient would be off his foot for essentially 2 weeks. Patient states her like to proceed with conservative treatment this time and will call if he wants to consider surgical intervention.  Follow-Up Instructions: Return if symptoms worsen or fail to improve.   Ortho Exam Examination patient is alert oriented no adenopathy well-dressed normal affect normal respiratory effort he has good dorsalis pedis pulse. He has a prominent arthritic changes of the PIP joint of the second toe this is rubbing on the great toe causing a small ulcer. There is no sausage digit swelling no cellulitis no signs of infection. There is no  exposed tendon or bone.  Imaging: No results found.  Orders:  No orders of the defined types were placed in this encounter.  No orders of the defined types were placed in this encounter.    Procedures: No procedures performed  Clinical Data: No additional findings.  Subjective: Review of Systems  Objective: Vital Signs: Ht 6' (1.829 m)   Wt 226 lb (102.5 kg)   BMI 30.65 kg/m   Specialty Comments:  No specialty comments available.  PMFS History: Patient Active Problem List   Diagnosis Date Noted  . Skin ulcer of second toe, right, limited to breakdown of skin (HCC) 09/09/2016  . BMI 29.0-29.9,adult 06/11/2015  . Gastric ulcer 01/14/2015  . Morbid obesity (BMI 30+) 08/26/2014  . Sinus pause   With blocked PACs, PVCs and sinus arrhythmia 10/10/2013  . Medication management 10/06/2013  . Hyperlipidemia 08/27/2013  . Essential tremor   . Proteinuria   . Prediabetes   . Gout   . Vitamin D deficiency   . Left rotator cuff syndrome 06/11/2012  . Essential hypertension 10/10/2010   Past Medical History:  Diagnosis Date  . Essential tremor   . Gout   . Hyperlipidemia   . Hypertension   . Prediabetes   . Proteinuria   .  Vitamin D deficiency     Family History  Problem Relation Age of Onset  . Dementia Mother   . Stroke Father   . Emphysema Brother     Past Surgical History:  Procedure Laterality Date  . Rt knee     Social History   Occupational History  . Not on file.   Social History Main Topics  . Smoking status: Never Smoker  . Smokeless tobacco: Never Used  . Alcohol use 7.0 oz/week    14 Standard drinks or equivalent per week  . Drug use: No  . Sexual activity: Not on file

## 2016-09-15 ENCOUNTER — Other Ambulatory Visit: Payer: Self-pay | Admitting: Internal Medicine

## 2016-09-23 NOTE — Progress Notes (Addendum)
Leroy Griffin was seen today in the movement disorders clinic for neurologic consultation at the request of MCKEOWN,WILLIAM DAVID, MD.  The consultation is for the evaluation of PD.  The records that were made available to me were reviewed.  Pt reports a long hx of tremor for about 5 years.  He was seen by neurology several years ago and just told he had tremor.  He was not placed on medication then.    Pt noted by PCP to have shuffling and festinating gait in November, 2017 and was placed on carbidopa/levodopa 10/100 at that time.    Pt states that gait changed about 1.5-2 years ago and he noted that he was "leaning forward" and stated that his dad did same thing.  Pt states that saw Dr. Danielle Dess some years ago about back and told him he would need lumbar fusion but didn't recommend it given age.  The patient initially started on this twice per day and did not notice significant benefit with this.  He followed up about a month later and ultimately transitioned to carbidopa/levodopa 25/250 3 times per day but admits that he didn't take that right away but is now on that.  That may have helped some.  He takes it right after he eats.    Specific Symptoms:  Tremor: Yes.   (notices it most when doing "heavy work" like splitting firewood or gets hot; stress also increases it) Family hx of similar:  No. (brother in law with PD) Voice: some softer per wife (but pt thinks that wife doesn't hear well) Sleep: sleeps well  Vivid Dreams:  Yes.    Acting out dreams:  No. Wet Pillows: No. Postural symptoms:  Yes.  , mild  Falls?  Yes.  , last 1+ year ago when daughters puppy grabbed slipper while going up stair Bradykinesia symptoms: shorter stride length Loss of smell:  No. Loss of taste:  No. Urinary Incontinence:  No. Difficulty Swallowing:  No. Handwriting, micrographia: Yes.   Trouble with ADL's:  No.  Trouble buttoning clothing: No. (not usually unless button down collar) Depression:  No. Memory  changes:  Intermittently trouble but rarely Hallucinations:  No.  visual distortions: No. N/V:  No. Lightheaded:  No.  Syncope: No. Diplopia:  No. Dyskinesia:  No.  Neuroimaging has not previously been performed.   PREVIOUS MEDICATIONS: Sinemet  ALLERGIES:   Allergies  Allergen Reactions  . Enalapril Rash  . Aspirin     Internal bleeding  . Nsaids     Internal bleeding  . Welchol [Colesevelam Hcl] Diarrhea  . Vasotec [Enalaprilat] Rash    CURRENT MEDICATIONS:  Outpatient Encounter Prescriptions as of 09/27/2016  Medication Sig  . acetaminophen-codeine (TYLENOL #3) 300-30 MG tablet Take by mouth.  Marland Kitchen allopurinol (ZYLOPRIM) 300 MG tablet Take 1/2 to 1 tablet daily as directed for Gout  . atorvastatin (LIPITOR) 20 MG tablet TAKE 1/2 TO 1 TABLET BY MOUTH EVERY DAY FOR CHOLESTEROL  . carbidopa-levodopa (SINEMET) 25-250 MG tablet Take 1 tablet 3 to 4 x/ day as directed (Patient taking differently: 1 TID)  . Cholecalciferol (VITAMIN D PO) Take 8,000 Units by mouth daily.   . diazepam (VALIUM) 5 MG tablet TAKE 1/2-1 TABLET BY MOUTH THREE TIMES DAILY AS NEEDED FOR ANXIETY OR MUSCLE SPASMS  . Flaxseed, Linseed, (FLAX SEED OIL) 1000 MG CAPS Take 1,000 mg by mouth daily.  Marland Kitchen losartan (COZAAR) 100 MG tablet TAKE 1/2 TABLET BY MOUTH TWICE DAILY (Patient taking differently: take 1/2  tablet by mouth twice daily)  . magnesium gluconate (MAGONATE) 500 MG tablet Take 500 mg by mouth 2 (two) times daily.  . Omega-3 Fatty Acids (FISH OIL) 1200 MG CPDR Take by mouth at bedtime.   . triamcinolone ointment (KENALOG) 0.1 % Apply 1 application topically 3 (three) times daily.  . vitamin B-12 (CYANOCOBALAMIN) 1000 MCG tablet Take 1,000 mcg by mouth daily.  Marland Kitchen zinc gluconate 50 MG tablet Take 50 mg by mouth daily.  . [DISCONTINUED] OVER THE COUNTER MEDICATION Tumeric 1 tablet daily.   No facility-administered encounter medications on file as of 09/27/2016.     PAST MEDICAL HISTORY:   Past Medical  History:  Diagnosis Date  . Essential tremor   . Gout   . Hyperlipidemia   . Hypertension   . Parkinson's disease (HCC)   . Prediabetes   . Proteinuria   . Vitamin D deficiency     PAST SURGICAL HISTORY:   Past Surgical History:  Procedure Laterality Date  . CYST EXCISION     back  . KNEE SURGERY Right   . TONSILLECTOMY      SOCIAL HISTORY:   Social History   Social History  . Marital status: Married    Spouse name: N/A  . Number of children: N/A  . Years of education: N/A   Occupational History  . retired     Advertising account planner trucks, Loss adjuster, chartered   Social History Main Topics  . Smoking status: Never Smoker  . Smokeless tobacco: Never Used  . Alcohol use 7.0 oz/week    14 Standard drinks or equivalent per week     Comment: 2 drinks a night (gin and tonic)  . Drug use: No  . Sexual activity: Not on file   Other Topics Concern  . Not on file   Social History Narrative  . No narrative on file    FAMILY HISTORY:   Family Status  Relation Status  . Mother Deceased  . Father Deceased  . Sister Alive  . Brother Deceased  . Daughter Alive    ROS:  Some SOB for which he saw cardiology - is mostly DOE.   No lateralizing weakness or paresthesias.  A complete 10 system review of systems was obtained and was unremarkable apart from what is mentioned above.  PHYSICAL EXAMINATION:    VITALS:   Vitals:   09/27/16 0942  BP: (!) 144/84  Pulse: 66  Weight: 230 lb (104.3 kg)  Height: 6' (1.829 m)    GEN:  The patient appears stated age and is in NAD. HEENT:  Normocephalic, atraumatic.  The mucous membranes are dry. The superficial temporal arteries are without ropiness or tenderness. CV:  RRR Lungs:  CTAB Neck/HEME:  There are no carotid bruits bilaterally.  Neurological examination:  Orientation: The patient is alert and oriented x3. Fund of knowledge is appropriate.  Recent and remote memory are intact.  Attention and concentration are normal.    Able to name  objects and repeat phrases. Cranial nerves: There is good facial symmetry. Pupils are equal round and reactive to light bilaterally. Fundoscopic exam reveals clear margins bilaterally. Extraocular muscles are intact. The visual fields are full to confrontational testing. The speech is fluent and clear. Soft palate rises symmetrically and there is no tongue deviation. Hearing is intact to conversational tone. Sensation: Sensation is intact to light and pinprick throughout (facial, trunk, extremities). Vibration is decreased at the bilateral big toe. There is no extinction with double simultaneous stimulation. There is  no sensory dermatomal level identified. Motor: Strength is 5/5 in the bilateral upper and lower extremities.   Shoulder shrug is equal and symmetric.  There is no pronator drift. Deep tendon reflexes: Deep tendon reflexes are 2/4 at the bilateral biceps, triceps, brachioradialis, right patella, absent at the left patella and bilateral  achilles. Plantar responses are downgoing bilaterally.  Movement examination: Tone: There is normal tone in the bilateral upper extremities.  The tone in the lower extremities is normal  Abnormal movements: There is LE dyskinesia, overall mild.  He has some mild tremor when asked to pour water from one glass to another but doesn't spill it.   Coordination:  There is no decremation with RAM's, with any form of RAMS, including alternating supination and pronation of the forearm, hand opening and closing, finger taps, heel taps and toe taps. Gait and Station: The patient has no difficulty arising out of a deep-seated chair without the use of the hands. The patient is wide based and slow to ambulate.  He flexes at the waist.  He has slight decreased arm swing on the right.    LABS    Chemistry      Component Value Date/Time   NA 140 09/01/2016 1139   K 4.6 09/01/2016 1139   CL 102 09/01/2016 1139   CO2 25 09/01/2016 1139   BUN 17 09/01/2016 1139    CREATININE 1.04 09/01/2016 1139      Component Value Date/Time   CALCIUM 10.2 09/01/2016 1139   ALKPHOS 50 09/01/2016 1139   AST 19 09/01/2016 1139   ALT 13 09/01/2016 1139   BILITOT 1.1 09/01/2016 1139     Lab Results  Component Value Date   HGBA1C 5.1 09/01/2016   Lab Results  Component Value Date   TSH 1.93 09/01/2016     ASSESSMENT/PLAN:  1.  Parkinsonism  -likely vascular in nature but cannot be sure as patient was on levodopa today.  Certainly didn't meet criteria for PD today but could be due to fact that medication is covering sx's.    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  I talked to the patient about the difference between idiopathic Parkinson's disease and vascular parkinsonism.  I told the patient that carbidopa/levodopa only works about 30% of the time in patients with vascular parkinsonism, and in those patients that it does work, it usually only works for a few years.  The patient thinks current levodopa may help but isn't sure.  May need to do levodopa challenge in the future  -MRI brain without gad  -think that he likely has long hx of ET.  Will try to get records from [facility] (addendum:  Records received and reviewed on 10/27/16 and pt saw Dr Sandria Manly on 12/11/06 and he felt that the patient had essential tremor.  No medication given)  2.  Proteinuria  -following with nephrology  3.  Balance change  -likely combo of parkinsonism but also has some evidence of peripheral neuropathy.  May benefit from PT in future.  Will discuss more when comes back in few weeks  4.  Memory change  -discussed neuropsych testing but decided to hold for now.  Pt very well educated so would fool cursory screeing tests.  I do not suspect dementia  5.  F/u in few weeks after above is completed.  Will hold levodopa next visit.  Much greater than 50%  of this visit was spent in counseling and coordinating care.  Total face to face time:  60 min   Cc:  Nadean CorwinMCKEOWN,WILLIAM DAVID, MD

## 2016-09-27 ENCOUNTER — Encounter: Payer: Self-pay | Admitting: Neurology

## 2016-09-27 ENCOUNTER — Ambulatory Visit (INDEPENDENT_AMBULATORY_CARE_PROVIDER_SITE_OTHER): Payer: Medicare Other | Admitting: Neurology

## 2016-09-27 VITALS — BP 144/84 | HR 66 | Ht 72.0 in | Wt 230.0 lb

## 2016-09-27 DIAGNOSIS — R413 Other amnesia: Secondary | ICD-10-CM | POA: Diagnosis not present

## 2016-09-27 DIAGNOSIS — R270 Ataxia, unspecified: Secondary | ICD-10-CM | POA: Diagnosis not present

## 2016-09-27 DIAGNOSIS — G214 Vascular parkinsonism: Secondary | ICD-10-CM

## 2016-09-27 DIAGNOSIS — R2681 Unsteadiness on feet: Secondary | ICD-10-CM

## 2016-09-27 NOTE — Patient Instructions (Signed)
1. We have [facility] and they will call you directly to schedule your appt. They are located at [address] [facility] [address] If you need to contact them directly please call [phone].  2. Follow up 6 weeks. We have scheduled you for 11/09/16 at 11:15 am. Please stop Carbidopa Levodopa 24 hours prior to this appt.

## 2016-10-02 ENCOUNTER — Ambulatory Visit
Admission: RE | Admit: 2016-10-02 | Discharge: 2016-10-02 | Disposition: A | Discharge status: Home / Self Care | Payer: Medicare Other | Source: Ambulatory Visit | Attending: Neurology | Admitting: Neurology

## 2016-10-02 DIAGNOSIS — R55 Syncope and collapse: Secondary | ICD-10-CM | POA: Diagnosis not present

## 2016-10-02 DIAGNOSIS — R270 Ataxia, unspecified: Secondary | ICD-10-CM

## 2016-10-02 DIAGNOSIS — G214 Vascular parkinsonism: Secondary | ICD-10-CM

## 2016-10-02 DIAGNOSIS — R2681 Unsteadiness on feet: Secondary | ICD-10-CM

## 2016-10-03 ENCOUNTER — Telehealth: Payer: Self-pay | Admitting: Neurology

## 2016-10-03 NOTE — Telephone Encounter (Signed)
Mychart message sent to patient.

## 2016-10-03 NOTE — Telephone Encounter (Signed)
-----   Message from Octaviano Battyebecca S Tat, DO sent at 10/03/2016  7:29 AM EST ----- Reviewed.  Mild WMD.  Mod to severe atrophy.  Ventricles enlarged, probably not out of proportion to the amount of atrophy present.  Lesly RubensteinJade, you can let pt/family know that I will talk with them about this in detail but nothing new on MRI but did show some shrink of the brain with age/time and some mild hardening of arteries in brain (not unexpected)

## 2016-10-13 DIAGNOSIS — R809 Proteinuria, unspecified: Secondary | ICD-10-CM | POA: Diagnosis not present

## 2016-11-02 ENCOUNTER — Encounter: Payer: Self-pay | Admitting: Internal Medicine

## 2016-11-08 NOTE — Progress Notes (Signed)
Leroy Griffin was seen today in the movement disorders clinic for neurologic consultation at the request of MCKEOWN,WILLIAM DAVID, MD.  The consultation is for the evaluation of PD.  The records that were made available to me were reviewed.  Pt reports a long hx of tremor for about 5 years.  He was seen by neurology several years ago and just told he had tremor.  He was not placed on medication then.    Pt noted by PCP to have shuffling and festinating gait in November, 2017 and was placed on carbidopa/levodopa 10/100 at that time.    Pt states that gait changed about 1.5-2 years ago and he noted that he was "leaning forward" and stated that his dad did same thing.  Pt states that saw Dr. Danielle Dess some years ago about back and told him he would need lumbar fusion but didn't recommend it given age.  The patient initially started on this twice per day and did not notice significant benefit with this.  He followed up about a month later and ultimately transitioned to carbidopa/levodopa 25/250 3 times per day but admits that he didn't take that right away but is now on that.  That may have helped some.  He takes it right after he eats.     Neuroimaging has not previously been performed.   11/09/16 update:  Patient follows up today.  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  I was able to get [facility] records all the way back to 2008, at which point the patient was diagnosed with essential tremor.  MRI of the brain demonstrated mild to moderate small vessel disease.  There was global atrophy.  There was ventricular enlargement, which may be slightly out of proportion for the degree of atrophy (although radiology did not feel so).  Noting some hip pain when working in yard.  Asks if gait abnormality could be due to hip issue.  Pain radiates from hip down back of leg and toward knee.  No falls.  PREVIOUS MEDICATIONS:  Sinemet  ALLERGIES:   Allergies  Allergen Reactions  . Enalapril Rash  . Aspirin     Internal bleeding  . Nsaids     Internal bleeding  . Welchol [Colesevelam Hcl] Diarrhea  . Vasotec [Enalaprilat] Rash    CURRENT MEDICATIONS:  Outpatient Encounter Prescriptions as of 11/09/2016  Medication Sig  . acetaminophen-codeine (TYLENOL #3) 300-30 MG tablet Take by mouth.  Marland Kitchen allopurinol (ZYLOPRIM) 300 MG tablet Take 1/2 to 1 tablet daily as directed for Gout  . atorvastatin (LIPITOR) 20 MG tablet TAKE 1/2 TO 1 TABLET BY MOUTH EVERY DAY FOR CHOLESTEROL  . carbidopa-levodopa (SINEMET) 25-250 MG tablet Take 1 tablet 3 to 4 x/ day as directed (Patient taking differently: 1 TID)  . Cholecalciferol (VITAMIN D PO) Take 8,000 Units by mouth daily.   . diazepam (VALIUM) 5 MG tablet TAKE 1/2-1 TABLET BY MOUTH THREE TIMES DAILY AS NEEDED FOR ANXIETY OR MUSCLE SPASMS  . Flaxseed, Linseed, (FLAX SEED OIL) 1000 MG CAPS Take 1,000 mg by mouth daily.  Marland Kitchen losartan (COZAAR) 100 MG tablet TAKE 1/2 TABLET BY MOUTH TWICE DAILY (Patient taking differently: take 1/2 tablet by mouth twice daily)  . magnesium gluconate (MAGONATE) 500 MG tablet Take 500 mg by mouth 2 (two) times daily.  . Omega-3 Fatty Acids (FISH OIL) 1200 MG CPDR Take by mouth at bedtime.   . triamcinolone ointment (  KENALOG) 0.1 % Apply 1 application topically 3 (three) times daily.  . vitamin B-12 (CYANOCOBALAMIN) 1000 MCG tablet Take 1,000 mcg by mouth daily.  Marland Kitchen zinc gluconate 50 MG tablet Take 50 mg by mouth daily.   No facility-administered encounter medications on file as of 11/09/2016.     PAST MEDICAL HISTORY:   Past Medical History:  Diagnosis Date  . Essential tremor   . Gout   . Hyperlipidemia   . Hypertension   . Parkinson's disease (HCC)   . Prediabetes   . Proteinuria   . Vitamin D deficiency     PAST SURGICAL HISTORY:   Past Surgical History:  Procedure Laterality Date  . CYST EXCISION     back  . KNEE SURGERY Right   .  TONSILLECTOMY      SOCIAL HISTORY:   Social History   Social History  . Marital status: Married    Spouse name: N/A  . Number of children: N/A  . Years of education: N/A   Occupational History  . retired     Advertising account planner trucks, Loss adjuster, chartered   Social History Main Topics  . Smoking status: Never Smoker  . Smokeless tobacco: Never Used  . Alcohol use 7.0 oz/week    14 Standard drinks or equivalent per week     Comment: 2 drinks a night (gin and tonic)  . Drug use: No  . Sexual activity: Not on file   Other Topics Concern  . Not on file   Social History Narrative  . No narrative on file    FAMILY HISTORY:   Family Status  Relation Status  . Mother Deceased  . Father Deceased  . Sister Alive  . Brother Deceased  . Daughter Alive    ROS:  Some SOB for which he saw cardiology - is mostly DOE.   No lateralizing weakness or paresthesias.  A complete 10 system review of systems was obtained and was unremarkable apart from what is mentioned above.  PHYSICAL EXAMINATION:    VITALS:   There were no vitals filed for this visit.  GEN:  The patient appears stated age and is in NAD. HEENT:  Normocephalic, atraumatic.  The mucous membranes are dry. The superficial temporal arteries are without ropiness or tenderness. CV:  RRR Lungs:  CTAB Neck/HEME:  There are no carotid bruits bilaterally.  Neurological examination:  Orientation: The patient is alert and oriented x3.  Cranial nerves: There is good facial symmetry.  Extraocular muscles are intact. The visual fields are full to confrontational testing. The speech is fluent and clear. Soft palate rises symmetrically and there is no tongue deviation. Hearing is intact to conversational tone. Sensation: Sensation is intact to light and pinprick throughout (facial, trunk, extremities). Vibration is decreased at the bilateral big toe. There is no extinction with double simultaneous stimulation. There is no sensory dermatomal level  identified. Motor: Strength is 5/5 in the bilateral upper and lower extremities.   Shoulder shrug is equal and symmetric.  There is no pronator drift. Deep tendon reflexes: Deep tendon reflexes are 2/4 at the bilateral biceps, triceps, brachioradialis, right patella, absent at the left patella and bilateral  achilles. Plantar responses are downgoing bilaterally.  Movement examination: Tone: There is normal tone in the bilateral upper extremities.  The tone in the lower extremities is normal  Abnormal movements: There is LE dyskinesia, overall mild.   Coordination:  There is no decremation with RAM's, with any form of RAMS, including alternating supination and pronation  of the forearm, hand opening and closing, finger taps, heel taps and toe taps. Gait and Station: The patient has no difficulty arising out of a deep-seated chair without the use of the hands. The patient is wide based and slow to ambulate.  He flexes at the waist.  He has slight decreased arm swing on the right.    LABS    Chemistry      Component Value Date/Time   NA 140 09/01/2016 1139   K 4.6 09/01/2016 1139   CL 102 09/01/2016 1139   CO2 25 09/01/2016 1139   BUN 17 09/01/2016 1139   CREATININE 1.04 09/01/2016 1139      Component Value Date/Time   CALCIUM 10.2 09/01/2016 1139   ALKPHOS 50 09/01/2016 1139   AST 19 09/01/2016 1139   ALT 13 09/01/2016 1139   BILITOT 1.1 09/01/2016 1139     Lab Results  Component Value Date   HGBA1C 5.1 09/01/2016   Lab Results  Component Value Date   TSH 1.93 09/01/2016     ASSESSMENT/PLAN:  1.  Parkinsonism  -possibly vascular in nature although MRI brain didn't show much small vessel disease.    Certainly didn't meet criteria for PD today but could be due to fact that medication is covering sx's.    -talked to him about his MRI brain.  While brain does have some atrophy, not sure that this is in proportion to degree of ventricular enlargement.  Disagree with radiology  interpretation in that regard.  Talked about value of high volume LP.  He would like to schedule.  Was shown films and discussed extensively r/b/se associated with LP  - May need to do levodopa challenge in the future  -He does have a long hx of ET, documented by Dr. Sandria Manly all the way back in 2008.    2.  Proteinuria  -following with nephrology  3.  Balance change  -likely combo of parkinsonism but also has some evidence of peripheral neuropathy.  May benefit from PT in future.  Will discuss more when completed LP  -pt thinks that hip/structural issue on the right may play role in gait and while that may contribute I don't think that it is the sole etiology.  His "hip" issue may be sciatica rather than the hip as well and we discussed this.  4.  Memory change  -discussed neuropsych testing but decided to hold for now.  Pt very well educated so would fool cursory screeing tests.  I do not suspect dementia  5.  F/u in few weeks after above is completed.  Much greater than 50% of this visit was spent in counseling and coordinating care.  Total face to face time:  35 min   Cc:  Nadean Corwin, MD

## 2016-11-09 ENCOUNTER — Encounter: Payer: Self-pay | Admitting: Neurology

## 2016-11-09 ENCOUNTER — Ambulatory Visit (INDEPENDENT_AMBULATORY_CARE_PROVIDER_SITE_OTHER): Payer: Medicare Other | Admitting: Neurology

## 2016-11-09 ENCOUNTER — Telehealth: Payer: Self-pay | Admitting: Neurology

## 2016-11-09 VITALS — BP 108/70 | HR 70 | Ht 72.0 in | Wt 227.0 lb

## 2016-11-09 DIAGNOSIS — G919 Hydrocephalus, unspecified: Secondary | ICD-10-CM | POA: Diagnosis not present

## 2016-11-09 DIAGNOSIS — R9402 Abnormal brain scan: Secondary | ICD-10-CM

## 2016-11-09 DIAGNOSIS — R2681 Unsteadiness on feet: Secondary | ICD-10-CM | POA: Diagnosis not present

## 2016-11-09 DIAGNOSIS — G20 Parkinson's disease: Secondary | ICD-10-CM | POA: Diagnosis not present

## 2016-11-09 NOTE — Telephone Encounter (Signed)
Patient made aware of information.

## 2016-11-09 NOTE — Telephone Encounter (Signed)
Left message on machine for patient to call back.   To make him aware I have set up lumbar puncture with PT evaluation at [facility] Short Stay on Tuesday 11/15/16 at 1:00 pm to arrive at 11:30 am.  He will need to be NPO after midnight and will need a driver. Awaiting call back to make him aware.

## 2016-11-09 NOTE — Addendum Note (Signed)
Addended bySilvio Pate on: 11/09/2016 03:32 PM   Modules accepted: Orders

## 2016-11-14 ENCOUNTER — Ambulatory Visit: Payer: Medicare Other | Admitting: Internal Medicine

## 2016-11-14 ENCOUNTER — Other Ambulatory Visit: Payer: Self-pay | Admitting: General Surgery

## 2016-11-15 ENCOUNTER — Ambulatory Visit (HOSPITAL_COMMUNITY)
Admission: RE | Admit: 2016-11-15 | Discharge: 2016-11-15 | Disposition: A | Discharge status: Home / Self Care | Payer: Medicare Other | Source: Ambulatory Visit | Attending: Neurology | Admitting: Neurology

## 2016-11-15 ENCOUNTER — Encounter (HOSPITAL_COMMUNITY): Payer: Self-pay

## 2016-11-15 DIAGNOSIS — G20 Parkinson's disease: Secondary | ICD-10-CM | POA: Diagnosis not present

## 2016-11-15 DIAGNOSIS — G919 Hydrocephalus, unspecified: Secondary | ICD-10-CM | POA: Insufficient documentation

## 2016-11-15 DIAGNOSIS — R930 Abnormal findings on diagnostic imaging of skull and head, not elsewhere classified: Secondary | ICD-10-CM | POA: Diagnosis not present

## 2016-11-15 LAB — CSF CELL COUNT WITH DIFFERENTIAL
RBC Count, CSF: 32 /mm3 — ABNORMAL HIGH
Tube #: 4
WBC, CSF: 0 /mm3 (ref 0–5)

## 2016-11-15 LAB — GLUCOSE, CSF: Glucose, CSF: 53 mg/dL (ref 40–70)

## 2016-11-15 LAB — PROTEIN, CSF: Total  Protein, CSF: 34 mg/dL (ref 15–45)

## 2016-11-15 MED ORDER — LIDOCAINE HCL (PF) 1 % IJ SOLN
10.0000 mL | Freq: Once | INTRAMUSCULAR | Status: AC
  Administered 2016-11-15: 7 mL via INTRADERMAL

## 2016-11-15 MED ORDER — LIDOCAINE HCL 1 % IJ SOLN
INTRAMUSCULAR | Status: AC
  Filled 2016-11-15: qty 10

## 2016-11-15 NOTE — Discharge Instructions (Signed)
Lumbar Puncture, Care After °Refer to this sheet in the next few weeks. These instructions provide you with information on caring for yourself after your procedure. Your health care provider may also give you more specific instructions. Your treatment has been planned according to current medical practices, but problems sometimes occur. Call your health care provider if you have any problems or questions after your procedure. °What can I expect after the procedure? °After your procedure, it is typical to have the following sensations: °· Mild discomfort or pain at the insertion site. °· Mild headache that is relieved with pain medicines. ° °Follow these instructions at home: ° °· Avoid lifting anything heavier than 10 lb (4.5 kg) for at least 12 hours after the procedure. °· Drink enough fluids to keep your urine clear or pale yellow. °Contact a health care provider if: °· You have fever or chills. °· You have nausea or vomiting. °· You have a headache that lasts for more than 2 days. °Get help right away if: °· You have any numbness or tingling in your legs. °· You are unable to control your bowel or bladder. °· You have bleeding or swelling in your back at the insertion site. °· You are dizzy or faint. °This information is not intended to replace advice given to you by your health care provider. Make sure you discuss any questions you have with your health care provider. °Document Released: 07/30/2013 Document Revised: 12/31/2015 Document Reviewed: 04/02/2013 °Elsevier Interactive Patient Education © 2017 [employer] ° °

## 2016-11-15 NOTE — Procedures (Signed)
Procedure: LP with fluoroscopic guidance at L1-2. Specimen: CSF to lab Bleeding: minimal. Complications: None immediate. Patient   -Condition: Stable.  -Disposition:  To short stay and for repeat PT testing after large volume tap.  Full Radiology report to follow under IMAGING

## 2016-11-18 ENCOUNTER — Telehealth: Payer: Self-pay | Admitting: Neurology

## 2016-11-18 NOTE — Telephone Encounter (Signed)
Left message on machine for patient to call back.

## 2016-11-18 NOTE — Telephone Encounter (Signed)
LP reviewed.  PT said no significant benefit to high volume tap.  Ask patient if he agrees that no significant improvement in walking post LP.  Schedule levodopa challenge with patient (its okay if its a while out)

## 2016-11-18 NOTE — Telephone Encounter (Signed)
Spoke with patient. He states he didn't notice a difference. On/off test scheduled.

## 2016-11-22 ENCOUNTER — Other Ambulatory Visit: Payer: Self-pay | Admitting: Internal Medicine

## 2016-11-22 DIAGNOSIS — I10 Essential (primary) hypertension: Secondary | ICD-10-CM

## 2016-11-30 NOTE — Progress Notes (Signed)
Patient ID: ALVY ALSOP, male   DOB: 23-Dec-1945, 71 y.o.   MRN: [id number]  Assessment and Plan:  Hypertension:  -Continue medication,  -monitor blood pressure at home.  -Continue DASH diet.   -Reminder to go to the ER if any CP, SOB, nausea, dizziness, severe HA, changes vision/speech, left arm numbness and tingling, and jaw pain.  Cholesterol: -Continue diet and exercise.  -Check cholesterol.   Parkinsonism Continue follow up tat, ? If would benefit from parkinson neurorehab ? If lower back pain contributing to walking instablity, declines work up at this time, will discuss with Dr. Arbutus Leas.  Vitamin D Def: -check level -continue medications.   Morbid Obesity with co morbidities - long discussion about weight loss, diet, and exercise  Continue diet and meds as discussed. Further disposition pending results of labs. Future Appointments Date Time Provider Department Center  01/17/2017 2:30 PM Octaviano Batty Tat, DO [facility] None  02/14/2017 3:00 PM Duke Salvia, MD [facility]  03/28/2017 3:00 PM Lucky Cowboy, MD [facility] None    HPI 71 y.o. male  presents for 3 month follow up with hypertension, hyperlipidemia, prediabetes and vitamin D.   His blood pressure has been controlled at home, today their BP is BP: 136/80.   He does not workout. He denies chest pain and dizziness. He has been following for cardiology with Dr. Graciela Husbands, negative stress test/cardiac MRI 09/2015.  Follows with Dr. Arbutus Leas for parkinsonism, on sinemet, had normal MRI brain other than volume loss/small vessel changes, had normal LP without changes. Getting worked up there, going to go in for injection. Has back pain, bilateral leg pain, worse right leg posterior to ankle occ. Worse with walking, last fall was 1.5 years ago.  He has grandson Mosetta Putt, 77 year old. He has a history of GI bleeds.   He is on cholesterol medication and denies myalgias. His cholesterol is at goal. The cholesterol last visit  was:   Lab Results  Component Value Date   CHOL 204 (H) 09/01/2016   HDL 53 09/01/2016   LDLCALC 115 (H) 09/01/2016   TRIG 181 (H) 09/01/2016   CHOLHDL 3.8 09/01/2016   Last W0J in the office was:  Lab Results  Component Value Date   HGBA1C 5.1 09/01/2016   Patient is on Vitamin D supplement.  Lab Results  Component Value Date   VD25OH 46 09/01/2016     BMI is Body mass index is 31.09 kg/m., he is working on diet and exercise. Wt Readings from Last 3 Encounters:  12/01/16 229 lb 3.2 oz (104 kg)  11/15/16 220 lb (99.8 kg)  11/09/16 227 lb (103 kg)   Has CKD, follows with Dr. Kathrene Bongo.   Lab Results  Component Value Date   GFRNONAA 71 09/01/2016    Current Medications:  Current Outpatient Prescriptions on File Prior to Visit  Medication Sig Dispense Refill  . acetaminophen-codeine (TYLENOL #3) 300-30 MG tablet Take 1 tablet by mouth every 6 (six) hours as needed.     Marland Kitchen allopurinol (ZYLOPRIM) 300 MG tablet Take 1/2 to 1 tablet daily as directed for Gout (Patient taking differently: Take 150 mg by mouth daily. For Gout) 90 tablet 1  . atorvastatin (LIPITOR) 20 MG tablet Take 20 mg by mouth daily.    . carbidopa-levodopa (SINEMET IR) 10-100 MG tablet Take 1 tablet by mouth See admin instructions. Pt takes 1 tablet every morning, and may take an additional dose in the evening if needed    . Cholecalciferol (VITAMIN  D) 2000 units tablet Take 10,000 Units by mouth every morning.     . diazepam (VALIUM) 5 MG tablet Take 2.5 mg by mouth every morning.    . Flaxseed, Linseed, (FLAX SEED OIL) 1000 MG CAPS Take 1,000 mg by mouth every evening.     Marland Kitchen losartan (COZAAR) 100 MG tablet TAKE 1/2 TABLET BY MOUTH TWICE DAILY 90 tablet 0  . MAGNESIUM PO Take 50 mg by mouth 2 (two) times daily.    . Minoxidil (ROGAINE MENS) 5 % FOAM Apply 1 application topically daily.    . Omega-3 Fatty Acids (FISH OIL) 1200 MG CPDR Take 2 capsules by mouth at bedtime.     . triamcinolone ointment  (KENALOG) 0.1 % Apply 1 application topically 3 (three) times daily. (Patient taking differently: Apply 1 application topically 2 (two) times daily as needed. ) 80 g 3  . vitamin B-12 (CYANOCOBALAMIN) 1000 MCG tablet Take 1,000 mcg by mouth daily.    Marland Kitchen zinc gluconate 50 MG tablet Take 50 mg by mouth daily.     No current facility-administered medications on file prior to visit.     Medical History:  Past Medical History:  Diagnosis Date  . Essential tremor   . Gout   . Hyperlipidemia   . Hypertension   . Parkinson's disease (HCC)   . Prediabetes   . Proteinuria   . Vitamin D deficiency     Allergies:  Allergies  Allergen Reactions  . Enalapril Rash  . Aspirin     Internal bleeding  . Nsaids     Internal bleeding  . Welchol [Colesevelam Hcl] Diarrhea  . Vasotec [Enalaprilat] Rash     Review of Systems:  Review of Systems  Constitutional: Negative.  Negative for chills, fever and malaise/fatigue.  HENT: Negative.  Negative for congestion, ear pain and sore throat.   Eyes: Negative.   Respiratory: Negative for cough, shortness of breath and wheezing.   Cardiovascular: Negative.  Negative for chest pain, palpitations and leg swelling.  Gastrointestinal: Negative for abdominal pain, blood in stool, constipation, diarrhea, heartburn, melena, nausea and vomiting.  Genitourinary: Negative.   Musculoskeletal: Positive for back pain and myalgias (x 1 year). Negative for falls, joint pain and neck pain.  Skin: Negative.   Neurological: Positive for focal weakness (legs). Negative for dizziness, tingling, tremors, sensory change, speech change, seizures, loss of consciousness and headaches.  Psychiatric/Behavioral: Negative.  Negative for depression. The patient is not nervous/anxious and does not have insomnia.     Family history- Review and unchanged  Social history- Review and unchanged  Physical Exam: BP 136/80   Pulse 72   Temp 98.1 F (36.7 C)   Resp 16   Ht 6'  (1.829 m)   Wt 229 lb 3.2 oz (104 kg)   SpO2 97%   BMI 31.09 kg/m  Wt Readings from Last 3 Encounters:  12/01/16 229 lb 3.2 oz (104 kg)  11/15/16 220 lb (99.8 kg)  11/09/16 227 lb (103 kg)    General appearance: alert, no distress, WD/WN, male, Mask facies HEENT: normocephalic, sclerae anicteric, TMs pearly, nares patent, no discharge or erythema, pharynx normal Oral cavity: MMM, no lesions Neck: supple, no lymphadenopathy, no thyromegaly, no masses Heart: RRR, normal S1, S2, no murmurs Lungs: CTA bilaterally, no wheezes, rhonchi, or rales Abdomen: +bs, soft, non tender, non distended, no masses, no hepatomegaly, no splenomegaly Musculoskeletal: nontender, no swelling, no obvious deformity Extremities: no edema, no cyanosis, no clubbing Pulses: 2+ symmetric, upper and  lower extremities, normal cap refill Neurological: alert, oriented x 3, CN2-12 intact, strength normal upper extremities and lower extremities, cogwheel rigidity present in bilateral upper extremities, mild tremor of the right hand present, sensation normal throughout, DTRs 2+ throughout, no cerebellar signs, shuffling bent over gait.   Psychiatric: normal affect, behavior normal, pleasant    Quentin Mulling, PA-C 11:45 AM [facility]

## 2016-12-01 ENCOUNTER — Encounter: Payer: Self-pay | Admitting: Physician Assistant

## 2016-12-01 ENCOUNTER — Ambulatory Visit (INDEPENDENT_AMBULATORY_CARE_PROVIDER_SITE_OTHER): Payer: Medicare Other | Admitting: Physician Assistant

## 2016-12-01 VITALS — BP 136/80 | HR 72 | Temp 98.1°F | Resp 16 | Ht 72.0 in | Wt 229.2 lb

## 2016-12-01 DIAGNOSIS — G20A1 Parkinson's disease without dyskinesia, without mention of fluctuations: Secondary | ICD-10-CM

## 2016-12-01 DIAGNOSIS — E782 Mixed hyperlipidemia: Secondary | ICD-10-CM

## 2016-12-01 DIAGNOSIS — Z79899 Other long term (current) drug therapy: Secondary | ICD-10-CM | POA: Diagnosis not present

## 2016-12-01 DIAGNOSIS — E559 Vitamin D deficiency, unspecified: Secondary | ICD-10-CM | POA: Diagnosis not present

## 2016-12-01 DIAGNOSIS — I10 Essential (primary) hypertension: Secondary | ICD-10-CM

## 2016-12-01 DIAGNOSIS — G20 Parkinson's disease: Secondary | ICD-10-CM

## 2016-12-01 LAB — BASIC METABOLIC PANEL WITH GFR
BUN: 19 mg/dL (ref 7–25)
CO2: 21 mmol/L (ref 20–31)
Calcium: 9.9 mg/dL (ref 8.6–10.3)
Chloride: 103 mmol/L (ref 98–110)
Creat: 1.01 mg/dL (ref 0.70–1.18)
GFR, Est African American: 86 mL/min (ref >=60)
GFR, Est Non African American: 74 mL/min (ref >=60)
Glucose, Bld: 103 mg/dL — ABNORMAL HIGH (ref 65–99)
Potassium: 4.6 mmol/L (ref 3.5–5.3)
Sodium: 139 mmol/L (ref 135–146)

## 2016-12-01 LAB — HEPATIC FUNCTION PANEL
ALT: 12 U/L (ref 9–46)
AST: 24 U/L (ref 10–35)
Albumin: 4.1 g/dL (ref 3.6–5.1)
Alkaline Phosphatase: 50 U/L (ref 40–115)
Bilirubin, Direct: 0.2 mg/dL (ref <=0.2)
Indirect Bilirubin: 0.8 mg/dL (ref 0.2–1.2)
Total Bilirubin: 1 mg/dL (ref 0.2–1.2)
Total Protein: 6.5 g/dL (ref 6.1–8.1)

## 2016-12-01 LAB — CBC WITH DIFFERENTIAL/PLATELET
Basophils Absolute: 0 cells/uL (ref 0–200)
Basophils Relative: 0 %
Eosinophils Absolute: 108 cells/uL (ref 15–500)
Eosinophils Relative: 3 %
HCT: 48.9 % (ref 38.5–50.0)
Hemoglobin: 16.3 g/dL (ref 13.2–17.1)
Lymphocytes Relative: 43 %
Lymphs Abs: 1548 cells/uL (ref 850–3900)
MCH: 32.7 pg (ref 27.0–33.0)
MCHC: 33.3 g/dL (ref 32.0–36.0)
MCV: 98.2 fL (ref 80.0–100.0)
MPV: 9.8 fL (ref 7.5–12.5)
Monocytes Absolute: 396 cells/uL (ref 200–950)
Monocytes Relative: 11 %
Neutro Abs: 1548 cells/uL (ref 1500–7800)
Neutrophils Relative %: 43 %
Platelets: 134 10*3/uL — ABNORMAL LOW (ref 140–400)
RBC: 4.98 MIL/uL (ref 4.20–5.80)
RDW: 14.5 % (ref 11.0–15.0)
WBC: 3.6 10*3/uL — ABNORMAL LOW (ref 3.8–10.8)

## 2016-12-01 LAB — LIPID PANEL
Cholesterol: 201 mg/dL — ABNORMAL HIGH (ref <200)
HDL: 56 mg/dL (ref >40)
LDL Cholesterol: 90 mg/dL (ref <100)
Total CHOL/HDL Ratio: 3.6 Ratio (ref <5.0)
Triglycerides: 273 mg/dL — ABNORMAL HIGH (ref <150)
VLDL: 55 mg/dL — ABNORMAL HIGH (ref <30)

## 2016-12-01 LAB — TSH: TSH: 1.92 mIU/L (ref 0.40–4.50)

## 2016-12-02 LAB — MAGNESIUM: Magnesium: 1.8 mg/dL (ref 1.5–2.5)

## 2016-12-06 ENCOUNTER — Other Ambulatory Visit: Payer: Self-pay | Admitting: Physician Assistant

## 2016-12-06 ENCOUNTER — Other Ambulatory Visit: Payer: Self-pay | Admitting: Internal Medicine

## 2016-12-06 DIAGNOSIS — M1000 Idiopathic gout, unspecified site: Secondary | ICD-10-CM

## 2016-12-06 NOTE — Telephone Encounter (Signed)
Please all Diazepam

## 2016-12-27 ENCOUNTER — Other Ambulatory Visit: Payer: Self-pay | Admitting: Physician Assistant

## 2017-01-05 NOTE — Progress Notes (Signed)
Leroy Griffin was seen today in the movement disorders clinic for neurologic consultation at the request of Lucky Cowboy, MD.  The consultation is for the evaluation of PD.  The records that were made available to me were reviewed.  Pt reports a long hx of tremor for about 5 years.  He was seen by neurology several years ago and just told he had tremor.  He was not placed on medication then.    Pt noted by PCP to have shuffling and festinating gait in November, 2017 and was placed on carbidopa/levodopa 10/100 at that time.    Pt states that gait changed about 1.5-2 years ago and he noted that he was "leaning forward" and stated that his dad did same thing.  Pt states that saw Dr. Danielle Dess some years ago about back and told him he would need lumbar fusion but didn't recommend it given age.  The patient initially started on this twice per day and did not notice significant benefit with this.  He followed up about a month later and ultimately transitioned to carbidopa/levodopa 25/250 3 times per day but admits that he didn't take that right away but is now on that.  That may have helped some.  He takes it right after he eats.     Neuroimaging has not previously been performed.   11/09/16 update:  Patient follows up today.  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  I was able to get [facility] records all the way back to 2008, at which point the patient was diagnosed with essential tremor.  MRI of the brain demonstrated mild to moderate small vessel disease.  There was global atrophy.  There was ventricular enlargement, which may be slightly out of proportion for the degree of atrophy (although radiology did not feel so).  Noting some hip pain when working in yard.  Asks if gait abnormality could be due to hip issue.  Pain radiates from hip down back of leg and toward knee.  No falls.  01/17/17 update:  Patient seen today  in follow-up.  He had a high volume lumbar puncture since last visit, with no change in symptoms.  He presents today for levodopa challenge.  States that he went from the carbidopa/levodopa 25/250 tid to carbidopa/levodopa 10/100 three times a day after meals because it was "wiping him out."   He last took the carbidopa/levodopa 10/100 at about midday.    PREVIOUS MEDICATIONS: Sinemet  ALLERGIES:   Allergies  Allergen Reactions  . Enalapril Rash  . Aspirin     Internal bleeding  . Nsaids     Internal bleeding  . Welchol [Colesevelam Hcl] Diarrhea  . Vasotec [Enalaprilat] Rash    CURRENT MEDICATIONS:  Outpatient Encounter Prescriptions as of 01/17/2017  Medication Sig  . acetaminophen-codeine (TYLENOL #3) 300-30 MG tablet Take 1 tablet by mouth every 6 (six) hours as needed.   Marland Kitchen allopurinol (ZYLOPRIM) 300 MG tablet TAKE 1/2 TO 1 TABLET BY MOUTH DAILY AS DIRECTED FOR GOUT  . atorvastatin (LIPITOR) 20 MG tablet Take 20 mg by mouth daily.  Marland Kitchen atorvastatin (LIPITOR) 20 MG tablet TAKE 1/2 TO 1 TABLET BY MOUTH EVERY DAY FOR CHOLESTEROL  . carbidopa-levodopa (SINEMET IR) 10-100 MG tablet Take 1 tablet by mouth See admin instructions. Pt takes 1 tablet every morning, and may take an additional dose in the evening if needed  . Cholecalciferol (VITAMIN D) 2000 units  tablet Take 10,000 Units by mouth every morning.   . diazepam (VALIUM) 5 MG tablet Take 1/2 to 1 tablet 3 x / day as needed for Tremor  . Flaxseed, Linseed, (FLAX SEED OIL) 1000 MG CAPS Take 1,000 mg by mouth every evening.   Marland Kitchen. losartan (COZAAR) 100 MG tablet TAKE 1/2 TABLET BY MOUTH TWICE DAILY  . MAGNESIUM PO Take 50 mg by mouth 2 (two) times daily.  . Minoxidil (ROGAINE MENS) 5 % FOAM Apply 1 application topically daily.  . Omega-3 Fatty Acids (FISH OIL) 1200 MG CPDR Take 2 capsules by mouth at bedtime.   . triamcinolone ointment (KENALOG) 0.1 % Apply 1 application topically 3 (three) times daily. (Patient taking differently: Apply 1  application topically 2 (two) times daily as needed. )  . vitamin B-12 (CYANOCOBALAMIN) 1000 MCG tablet Take 1,000 mcg by mouth daily.  Marland Kitchen. zinc gluconate 50 MG tablet Take 50 mg by mouth daily.  . [EXPIRED] carbidopa-levodopa (SINEMET IR) 25-100 MG per tablet immediate release 2.5 tablet    No facility-administered encounter medications on file as of 01/17/2017.     PAST MEDICAL HISTORY:   Past Medical History:  Diagnosis Date  . Essential tremor   . Gout   . Hyperlipidemia   . Hypertension   . Parkinson's disease (HCC)   . Prediabetes   . Proteinuria   . Vitamin D deficiency     PAST SURGICAL HISTORY:   Past Surgical History:  Procedure Laterality Date  . CYST EXCISION     back  . KNEE SURGERY Right   . TONSILLECTOMY      SOCIAL HISTORY:   Social History   Social History  . Marital status: Married    Spouse name: N/A  . Number of children: N/A  . Years of education: N/A   Occupational History  . 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 Smoking status: Never Smoker  . Smokeless tobacco: Never Used  . Alcohol use 7.0 oz/week    14 Standard drinks or equivalent per week     Comment: 2 drinks a night (gin and tonic)  . Drug use: No  . Sexual activity: Not on file   Other Topics Concern  . Not on file   Social History Narrative  . No narrative on file    FAMILY HISTORY:   Family Status  Relation Status  . Mother Deceased  . Father Deceased  . Sister Alive  . Brother Deceased  . Daughter Alive    ROS:  Some SOB for which he saw cardiology - is mostly DOE.   No lateralizing weakness or paresthesias.  A complete 10 system review of systems was obtained and was unremarkable apart from what is mentioned above.  PHYSICAL EXAMINATION:    VITALS:   Vitals:   01/17/17 1438  BP: 128/78  Pulse: 62  SpO2: 95%  Weight: 232 lb (105.2 kg)  Height: 6' (1.829 m)    GEN:  The patient appears stated age and is in NAD. HEENT:   Normocephalic, atraumatic.  The mucous membranes are dry. The superficial temporal arteries are without ropiness or tenderness. CV:  RRR Lungs:  CTAB Neck/HEME:  There are no carotid bruits bilaterally.  Neurological examination:  Orientation: The patient is alert and oriented x3.  Cranial nerves: There is good facial symmetry.  Extraocular muscles are intact. The visual fields are full to confrontational testing. The speech is fluent and clear.  Soft palate rises symmetrically and there is no tongue deviation. Hearing is intact to conversational tone. Sensation: Sensation is intact to light and pinprick throughout (facial, trunk, extremities). Vibration is decreased at the bilateral big toe. There is no extinction with double simultaneous stimulation. There is no sensory dermatomal level identified. Motor: Strength is 5/5 in the bilateral upper and lower extremities.   Shoulder shrug is equal and symmetric.  There is no pronator drift. Deep tendon reflexes: Deep tendon reflexes are 2/4 at the bilateral biceps, triceps, brachioradialis, right patella, absent at the left patella and bilateral  achilles. Plantar responses are downgoing bilaterally.  Levodopa challenge done today.  UPDRS motor off score was 14.  Pt then given 250mg  of levodopa dissolved in ginger ale and waited 35 minutes to re-examine him.  UPDRS motor on score was 12.  Details of UPDRS motor score documented on separate neurophysiologic worksheet.     Movement examination: Tone: There is normal tone in the bilateral upper extremities.  The tone in the lower extremities is normal  Abnormal movements: none Coordination:  There is no decremation with RAM's, with any form of RAMS, including alternating supination and pronation of the forearm, hand opening and closing, finger taps, heel taps and toe taps. Gait and Station: The patient has no difficulty arising out of a deep-seated chair without the use of the hands. The patient is wide  based and slow to ambulate.  He flexes at the waist.  Negative pull test  LABS    Chemistry      Component Value Date/Time   NA 139 12/01/2016 1211   K 4.6 12/01/2016 1211   CL 103 12/01/2016 1211   CO2 21 12/01/2016 1211   BUN 19 12/01/2016 1211   CREATININE 1.01 12/01/2016 1211      Component Value Date/Time   CALCIUM 9.9 12/01/2016 1211   ALKPHOS 50 12/01/2016 1211   AST 24 12/01/2016 1211   ALT 12 12/01/2016 1211   BILITOT 1.0 12/01/2016 1211     Lab Results  Component Value Date   HGBA1C 5.1 09/01/2016   Lab Results  Component Value Date   TSH 1.92 12/01/2016     ASSESSMENT/PLAN:  1.  Balance Change  -no evidence of PD.  Does not meet clinical criteria for PD.  -talked to him about his MRI brain.  While brain does have some atrophy, not sure that this is in proportion to degree of ventricular enlargement.  Disagree with radiology interpretation in that regard.  However, high volume LP did not help sx's  -levodopa challenge done on 01/17/17 and demonstrated levodopa is of no value.  -He does have a long hx of ET, documented by Dr. Sandria Manly all the way back in 2008.    -talked about DaT scan.  Not sure that it would add much but offered to patient.  He declined.  -I think that patients biggest issues with balance are structural with back pain and hip issues.  He also has peripheral neuropathy which likely contributes.  Decreasing EtOH could be of value.  Will refer to PT and sports med.    -d/c levodopa  2.  Proteinuria  -following with nephrology  3. F/u prn or if wants tx for ET.  Much greater than 50% of this visit was spent in counseling and coordinating care.  Total face to face time: 60 min, which didn't include the 30 min wait time for levodopa to kick in.   Cc:  Lucky Cowboy, MD

## 2017-01-17 ENCOUNTER — Ambulatory Visit (INDEPENDENT_AMBULATORY_CARE_PROVIDER_SITE_OTHER): Payer: Medicare Other | Admitting: Neurology

## 2017-01-17 ENCOUNTER — Encounter: Payer: Self-pay | Admitting: Neurology

## 2017-01-17 VITALS — BP 128/78 | HR 62 | Ht 72.0 in | Wt 232.0 lb

## 2017-01-17 DIAGNOSIS — R9402 Abnormal brain scan: Secondary | ICD-10-CM

## 2017-01-17 DIAGNOSIS — R251 Tremor, unspecified: Secondary | ICD-10-CM

## 2017-01-17 DIAGNOSIS — R2681 Unsteadiness on feet: Secondary | ICD-10-CM

## 2017-01-17 DIAGNOSIS — M545 Low back pain: Secondary | ICD-10-CM

## 2017-01-17 DIAGNOSIS — G609 Hereditary and idiopathic neuropathy, unspecified: Secondary | ICD-10-CM

## 2017-01-17 MED ORDER — CARBIDOPA-LEVODOPA 25-100 MG PO TABS
2.5000 | ORAL_TABLET | Freq: Once | ORAL | Status: AC
  Administered 2017-01-17: 2.5 via ORAL

## 2017-01-18 NOTE — Addendum Note (Signed)
Addended bySilvio Pate: Jakiyah Stepney L on: 01/18/2017 08:05 AM   Modules accepted: Orders

## 2017-01-19 NOTE — Addendum Note (Signed)
Addended bySilvio Pate: Gurleen Larrivee L on: 01/19/2017 11:52 AM   Modules accepted: Orders

## 2017-01-23 DIAGNOSIS — R809 Proteinuria, unspecified: Secondary | ICD-10-CM | POA: Diagnosis not present

## 2017-01-23 DIAGNOSIS — I129 Hypertensive chronic kidney disease with stage 1 through stage 4 chronic kidney disease, or unspecified chronic kidney disease: Secondary | ICD-10-CM | POA: Diagnosis not present

## 2017-01-23 DIAGNOSIS — E669 Obesity, unspecified: Secondary | ICD-10-CM | POA: Diagnosis not present

## 2017-01-23 DIAGNOSIS — E785 Hyperlipidemia, unspecified: Secondary | ICD-10-CM | POA: Diagnosis not present

## 2017-01-24 ENCOUNTER — Telehealth: Payer: Self-pay | Admitting: Neurology

## 2017-01-24 NOTE — Telephone Encounter (Signed)
Patient aware I sent referral to outpatient PT in [place]. Number given so he can call and reach out to them.

## 2017-01-24 NOTE — Telephone Encounter (Signed)
PT left a voicemail message saying Dr Tat had referred him to Physical Therapy in the [place] area and he has not heard anything yet

## 2017-01-25 ENCOUNTER — Encounter: Payer: Self-pay | Admitting: Internal Medicine

## 2017-01-28 ENCOUNTER — Other Ambulatory Visit: Payer: Self-pay | Admitting: Internal Medicine

## 2017-01-28 NOTE — Telephone Encounter (Signed)
Please call T[facility] # 3 

## 2017-02-14 ENCOUNTER — Ambulatory Visit: Payer: Medicare Other | Admitting: Internal Medicine

## 2017-02-15 ENCOUNTER — Ambulatory Visit (INDEPENDENT_AMBULATORY_CARE_PROVIDER_SITE_OTHER): Payer: Medicare Other | Admitting: Rehabilitative and Restorative Service Providers"

## 2017-02-15 ENCOUNTER — Encounter: Payer: Self-pay | Admitting: Rehabilitative and Restorative Service Providers"

## 2017-02-15 DIAGNOSIS — M6281 Muscle weakness (generalized): Secondary | ICD-10-CM | POA: Diagnosis not present

## 2017-02-15 DIAGNOSIS — R293 Abnormal posture: Secondary | ICD-10-CM

## 2017-02-15 DIAGNOSIS — R2689 Other abnormalities of gait and mobility: Secondary | ICD-10-CM

## 2017-02-15 NOTE — Therapy (Signed)
[facility] [place] [address] [place], [place], [address] Phone: [phone]   Fax:  [phone]  Physical Therapy Evaluation  Patient Details  Name: Leroy Griffin MRN: [id number] Date of Birth: 10-26-1945 Referring Provider: Dr Lurena Joiner Tat   Encounter Date: 02/15/2017      PT End of Session - 02/15/17 1031    Visit Number 1   Number of Visits 12   Date for PT Re-Evaluation 03/29/17   PT Start Time 1024   PT Stop Time 1115   PT Time Calculation (min) 51 min      Past Medical History:  Diagnosis Date  . Essential tremor   . Gout   . Hyperlipidemia   . Hypertension   . Parkinson's disease (HCC)   . Prediabetes   . Proteinuria   . Vitamin D deficiency     Past Surgical History:  Procedure Laterality Date  . CYST EXCISION     back  . KNEE SURGERY Right   . TONSILLECTOMY      There were no vitals filed for this visit.       Subjective Assessment - 02/15/17 1033    Subjective Patient reports that he started having balance and walking problems about a year ago. He was seen by MD and evaluated for Parkinson's Disease which was ruled out. He has spinal stenosis and essential tremor. He is not a surgical candidate. He has continued balance problems and difficulty waking safely.    Pertinent History Spinal stenosis; HNP cervical and lumbar disc; bone spurs; kidney; SOB; cardiac - ventricular problem decresaed ejection fraction; fx patella and scope Rt knee in remote past; Lt knee pain    How long can you sit comfortably? no limit   How long can you stand comfortably? 5 min    How long can you walk comfortably? not at all    Diagnostic tests xrays MRI   Patient Stated Goals improve ability to walk    Currently in Pain? No/denies   Pain Location Back   Pain Orientation Lower;Right;Left   Pain Descriptors / Indicators Nagging   Pain Type Chronic pain   Pain Radiating Towards into LE's at times    Pain Onset More  than a month ago   Pain Frequency Intermittent            [facility] PT Assessment - 02/15/17 0001      Assessment   Medical Diagnosis Low back pain; Gait instability    Referring Provider Dr Lurena Joiner Tat    Onset Date/Surgical Date 02/05/17   Hand Dominance Left   Next MD Visit PRN   Prior Therapy none for back/balance     Precautions   Precaution Comments limited endurance due to decresaed ejection fraction Lt ventricle      Balance Screen   Has the patient fallen in the past 6 months Yes   How many times? 1   Has the patient had a decrease in activity level because of a fear of falling?  No   Is the patient reluctant to leave their home because of a fear of falling?  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    Leisure yard work; Market researcher; sedentary  Observation/Other Assessments   Focus on Therapeutic Outcomes (FOTO)  56% limitation      Sensation   Additional Comments WFL's per pt report      Posture/Postural Control   Posture Comments head forward; shoulders rounded; flexed forward at hips and trunk      AROM   Right Hip Extension 0   Right Hip Flexion --  WFL's   Right Hip ABduction --  limited end range   Left Hip Extension -5   Left Hip Flexion --  WFL's    Left Hip ABduction --  limited end range      Strength   Right Hip Flexion --  5-/5   Right Hip Extension 4/5   Right Hip ABduction 4/5   Left Hip Flexion 4+/5   Left Hip Extension 4-/5   Left Hip ABduction 4-/5   Right Knee Flexion --  5-/5   Right Knee Extension 5/5   Left Knee Flexion 5/5  5-/5     Flexibility   Hamstrings tight bilat ~ 75-80 deg   Quadriceps tight ~ 110-115 deg      Palpation   Spinal mobility hypomobile   Palpation comment some  tightness bilat lumbar paraspinals; QL; lats      Transfers   Comments sit to stand independently but unsteady and reaching for support      Ambulation/Gait   Gait Comments ambulates with SPC Lt UE and holding wife's arm with Rt UE - wide based gait; forward flexed posture with trunk, hips, knees flexed - unsteady gait      Balance   Balance Assessed --  unable to stand on one leg without UE support             Objective measurements completed on examination: See above findings.          [facility] Adult PT Treatment/Exercise - 02/15/17 0001      Neuro Re-ed    Neuro Re-ed Details  working on standing posture and alignment focus on hip, knee and trunk extension      Knee/Hip Exercises: Stretches   Lobbyist Left;Both;3 reps;30 seconds   Hip Flexor Stretch Left;Both;2 reps;30 seconds  sitting one LE off edge of table - some LBP but tolerable      Knee/Hip Exercises: Standing   Other Standing Knee Exercises standing unsupported focus on hip, knee, trunk extension 1-2 min    Other Standing Knee Exercises standing back at wall working on hip, knee trunk extension 1-2 min                 PT Education - 02/15/17 1112    Education provided Yes   Education Details HEP posture and alignment    Person(s) Educated Patient   Methods Explanation;Demonstration;Tactile cues;Verbal cues;Handout   Comprehension Verbalized understanding;Returned demonstration;Verbal cues required;Tactile cues required             PT Long Term Goals - 02/15/17 1248      PT LONG TERM GOAL #1   Title Improve posture and alignment with patient to demonstrate improved upright posture wit hincresed extension through trunk, hips and knees in standing 03/29/17   Time 6   Period Weeks   Status New     PT LONG TERM GOAL #2   Title Increase strength bilat hips to 5/5 to 5/5 throughout 03/29/17   Time 6   Period Weeks   Status New     PT LONG TERM GOAL #3   Title Increase  hip extension  bilaterally to 10 degrees to improve posture and balance 03/29/17   Time 6   Period Weeks   Status New     PT LONG TERM GOAL #4   Title Patient to be independent in ambulation for functional distances of 300-500 ft with appropriate assistive device 03/29/17   Time 6   Period Weeks   Status New     PT LONG TERM GOAL #5   Title Independent in HEP 03/29/17   Time 6   Period Weeks   Status New     PT LONG TERM GOAL #6   Title Improve FOTO to </= 42% limitation 03/29/17   Time 6   Period Weeks   Status New                Plan - 02/15/17 1242    Clinical Impression Statement Hamid presents with c/o balance problems and difficulty swlking. He has some intermittent LBP as well. Thurmon has poor posture and alignment; limited trunk and hip mobilty and ROM; decresaed strength bilat LE's; balance deficits; abnormal gait; decresaed safety with ambulation. He will benefit fro mPhysical Therapy to address problems identified and improve safety and function.    Clinical Presentation Stable   Clinical Decision Making Low   Rehab Potential Good   Clinical Impairments Affecting Rehab Potential decreased endurance and exercise tolerance due to cardiac problems; long standing decline in ambulatory status    PT Frequency 2x / week   PT Duration 6 weeks   PT Treatment/Interventions Patient/family education;ADLs/Self Care Home Management;Electrical Stimulation;Iontophoresis 4mg /ml Dexamethasone;Moist Heat;Ultrasound;Dry needling;Manual techniques;Neuromuscular re-education;Therapeutic activities;Therapeutic exercise;Gait training   PT Next Visit Plan Balance assessment; further assess gait(with rolling walker if patient purchases walker for home); review exercises; progress with strengthing and strenghtening; gait training    Consulted and Agree with Plan of Care Patient;Family member/caregiver  wife      Patient will benefit from skilled therapeutic intervention in order to improve the following  deficits and impairments:  Postural dysfunction, Improper body mechanics, Abnormal gait, Decreased strength, Decreased mobility, Decreased range of motion, Decreased activity tolerance  Visit Diagnosis: Other abnormalities of gait and mobility - Plan: PT plan of care cert/re-cert  Abnormal posture - Plan: PT plan of care cert/re-cert  Muscle weakness (generalized) - Plan: PT plan of care cert/re-cert      [facility] PT PB G-CODES - 02/15/17 1252    Functional Assessment Tool Used  Evaluation; FOTO; clinical assessment    Functional Limitations Mobility: Walking and moving around   Mobility: Walking and Moving Around Goal Status [phone](G8979) At least 60 percent but less than 80 percent impaired, limited or restricted   Mobility: Walking and Moving Around Discharge Status [phone](G8980) At least 40 percent but less than 60 percent impaired, limited or restricted       Problem List Patient Active Problem List   Diagnosis Date Noted  . Skin ulcer of second toe, right, limited to breakdown of skin (HCC) 09/09/2016  . BMI 29.0-29.9,adult 06/11/2015  . Gastric ulcer 01/14/2015  . Morbid obesity (BMI 30+) 08/26/2014  . Sinus pause   With blocked PACs, PVCs and sinus arrhythmia 10/10/2013  . Medication management 10/06/2013  . Hyperlipidemia 08/27/2013  . Essential tremor   . Proteinuria   . Prediabetes   . Gout   . Vitamin D deficiency   . Left rotator cuff syndrome 06/11/2012  . Essential hypertension 10/10/2010    Daryon Remmert Rober MinionP Rhys Lichty PT, MPH  02/15/2017, 12:54 PM  [facility] [place] [address] [place], [place], [address] Phone: [phone]   Fax:  [phone]  Name: ARRICK DUTTON MRN: [id number] Date of Birth: 03/10/1946

## 2017-02-15 NOTE — Patient Instructions (Signed)
   Quads / HF, Prone   Lie face down. Grasp one ankle with same-side hand. Use towel if needed to reach. Gently pull foot toward buttock.  Hold 30 seconds. Repeat 3 times per session. Do 2 sessions per day.   Standing back against wall  Straighten hips and knees  Hold for 1 min  Relax repeat 4-5 times  Standing to work on posture and alignment During TV commercial  For a 30 min program  2-3 times a day

## 2017-02-22 DIAGNOSIS — H527 Unspecified disorder of refraction: Secondary | ICD-10-CM | POA: Diagnosis not present

## 2017-02-22 DIAGNOSIS — H25813 Combined forms of age-related cataract, bilateral: Secondary | ICD-10-CM | POA: Diagnosis not present

## 2017-02-22 DIAGNOSIS — H43393 Other vitreous opacities, bilateral: Secondary | ICD-10-CM | POA: Diagnosis not present

## 2017-02-24 ENCOUNTER — Ambulatory Visit (INDEPENDENT_AMBULATORY_CARE_PROVIDER_SITE_OTHER): Payer: Medicare Other | Admitting: Physical Therapy

## 2017-02-24 DIAGNOSIS — M6281 Muscle weakness (generalized): Secondary | ICD-10-CM | POA: Diagnosis not present

## 2017-02-24 DIAGNOSIS — R293 Abnormal posture: Secondary | ICD-10-CM

## 2017-02-24 DIAGNOSIS — R2689 Other abnormalities of gait and mobility: Secondary | ICD-10-CM

## 2017-02-24 NOTE — Patient Instructions (Addendum)
Hip Flexor Stretch    Lying on back near edge of bed, bend one leg, foot flat. Hang other leg over edge, relaxed, thigh resting entirely on bed for _15-45__ seconds. Repeat __2-3__ times.  Therapeutic - Bridging    Lift buttocks, keeping back straight and arms on floor.  Repeat _10___ times.[facility]\   [facility] [address] [place], [place] [address]  [phone] (office) [phone] (fax)

## 2017-02-24 NOTE — Therapy (Signed)
[facility] [place] [address] [place] [address] [place], [place], [address] Phone: [phone]   Fax:  [phone]  Physical Therapy Treatment  Patient Details  Name: Leroy Griffin MRN: [id number] Date of Birth: [birth year] Referring Provider: Dr. Lurena Joiner Tat  Encounter Date: 02/24/2017      PT End of Session - 02/24/17 1432    Visit Number 2   Number of Visits 12   Date for PT Re-Evaluation 03/29/17   PT Start Time 1405   PT Stop Time 1451   PT Time Calculation (min) 46 min   Activity Tolerance Patient tolerated treatment well;No increased pain   Behavior During Therapy WFL for tasks assessed/performed      Past Medical History:  Diagnosis Date  . Essential tremor   . Gout   . Hyperlipidemia   . Hypertension   . Parkinson's disease (HCC)   . Prediabetes   . Proteinuria   . Vitamin D deficiency     Past Surgical History:  Procedure Laterality Date  . CYST EXCISION     back  . KNEE SURGERY Right   . TONSILLECTOMY      There were no vitals filed for this visit.      Subjective Assessment - 02/24/17 1408    Subjective "I have lots of questions (regarding HEP)".  He arrives ambulating with SPC and holding onto his wife with Rt UE.  Pt and wife spoke of pt using a borrowed rollator when in [place], have thought of getting one.    Patient is accompained by: Family member  wife, French Ana   Pertinent History Spinal stenosis; HNP cervical and lumbar disc; bone spurs; kidney; SOB; cardiac - ventricular problem decresaed ejection fraction; fx patella and scope Rt knee in remote past; Lt knee pain    Currently in Pain? No/denies            [facility] PT Assessment - 02/24/17 0001      Assessment   Medical Diagnosis Low back pain; Gait instability    Referring Provider Dr. Lurena Joiner Tat   Onset Date/Surgical Date 02/05/17   Hand Dominance Left   Next MD Visit PRN     Flexibility   Quadriceps RLE 114 deg,  LLE 119 deg     Balance   Balance Assessed Yes     Standardized Balance Assessment   Standardized Balance Assessment Timed Up and Go Test     Timed Up and Go Test   Normal TUG (seconds) 15.05  no AD          [facility] Adult PT Treatment/Exercise - 02/24/17 0001      Knee/Hip Exercises: Stretches   Quad Stretch Right;Left;2 reps  45 sec, towel above knee. assist to level hips.    Hip Flexor Stretch Right;Left;2 reps  45 sec, leg off of edge of bed   Hip Flexor Stretch Limitations trial of seated with leg back; spasm in hamstrings.      Knee/Hip Exercises: Standing   Other Standing Knee Exercises standing trunk ext, with light UE on counter x 2 min, cues for posture   Other Standing Knee Exercises standing back at wall working on hip, knee trunk extension 1-2 min      Knee/Hip Exercises: Seated   Sit to Sand --  2 reps, VC to align legs prior to descent     Knee/Hip Exercises: Supine   Bridges Limitations Bridge x 10 reps, cues to engage core.    Other Supine Knee/Hip Exercises VC  for log roll to/from sit position.            PT Long Term Goals - 02/24/17 1436      PT LONG TERM GOAL #1   Title Improve posture and alignment with patient to demonstrate improved upright posture wit hincresed extension through trunk, hips and knees in standing 03/29/17   Time 6   Period Weeks   Status On-going     PT LONG TERM GOAL #2   Title Increase strength bilat hips to 5/5 to 5/5 throughout 03/29/17   Time 6   Period Weeks   Status On-going     PT LONG TERM GOAL #3   Title Increase hip extension bilaterally to 10 degrees to improve posture and balance 03/29/17   Time 6   Period Weeks   Status On-going     PT LONG TERM GOAL #4   Title Patient to be independent in ambulation for functional distances of 300-500 ft with appropriate assistive device 03/29/17   Time 6   Period Weeks     PT LONG TERM GOAL #5   Title Independent in HEP 03/29/17   Time 6   Period Weeks   Status On-going     PT LONG TERM GOAL  #6   Title Improve FOTO to </= 42% limitation 03/29/17   Time 6   Period Weeks   Status On-going               Plan - 02/24/17 1524    Clinical Impression Statement Pt had improved tolerance for quad stretch with placement of towel above the knee, and switching hip flexor stretch to supine on bed. Pt scored 15 sec on TUG, placing him at increased risk for falls.   Pt will benefit from continued PT intervention to max functional mobility and safety.    Rehab Potential Good   Clinical Impairments Affecting Rehab Potential decreased endurance and exercise tolerance due to cardiac problems; long standing decline in ambulatory status    PT Frequency 2x / week   PT Duration 6 weeks   PT Treatment/Interventions Patient/family education;ADLs/Self Care Home Management;Electrical Stimulation;Iontophoresis 4mg /ml Dexamethasone;Moist Heat;Ultrasound;Dry needling;Manual techniques;Neuromuscular re-education;Therapeutic activities;Therapeutic exercise;Gait training   PT Next Visit Plan Berg balance test; progress HEP as tolerated.  Educate pt on gait with rollator if purchased.    Consulted and Agree with Plan of Care Patient;Family member/caregiver      Patient will benefit from skilled therapeutic intervention in order to improve the following deficits and impairments:  Postural dysfunction, Improper body mechanics, Abnormal gait, Decreased strength, Decreased mobility, Decreased range of motion, Decreased activity tolerance  Visit Diagnosis: Other abnormalities of gait and mobility  Abnormal posture  Muscle weakness (generalized)     Problem List Patient Active Problem List   Diagnosis Date Noted  . Skin ulcer of second toe, right, limited to breakdown of skin (HCC) 09/09/2016  . BMI 29.0-29.9,adult 06/11/2015  . Gastric ulcer 01/14/2015  . Morbid obesity (BMI 30+) 08/26/2014  . Sinus pause   With blocked PACs, PVCs and sinus arrhythmia 10/10/2013  . Medication management  10/06/2013  . Hyperlipidemia 08/27/2013  . Essential tremor   . Proteinuria   . Prediabetes   . Gout   . Vitamin D deficiency   . Left rotator cuff syndrome 06/11/2012  . Essential hypertension 10/10/2010   Mayer CamelJennifer Carlson-Long, PTA 02/24/17 3:38 PM  [facility] [address] [place] [address] [place], [place], [address] Phone: [phone]   Fax:  [phone]  Name: Leroy Griffin MRN: [id number] Date of Birth: [birth year]

## 2017-02-28 ENCOUNTER — Ambulatory Visit (INDEPENDENT_AMBULATORY_CARE_PROVIDER_SITE_OTHER): Payer: Medicare Other | Admitting: Physical Therapy

## 2017-02-28 DIAGNOSIS — M6281 Muscle weakness (generalized): Secondary | ICD-10-CM

## 2017-02-28 DIAGNOSIS — R293 Abnormal posture: Secondary | ICD-10-CM

## 2017-02-28 DIAGNOSIS — R2689 Other abnormalities of gait and mobility: Secondary | ICD-10-CM

## 2017-02-28 NOTE — Therapy (Signed)
[facility] [place] [address] [place], [place], [address] Phone: [phone]   Fax:  [phone]  Physical Therapy Treatment  Patient Details  Name: Leroy Griffin MRN: [id number] Date of Birth: Feb 23, 1946 Referring Provider: Dr. Lurena Joiner Tat  Encounter Date: 02/28/2017      PT End of Session - 02/28/17 1240    Visit Number 3   Number of Visits 12   Date for PT Re-Evaluation 03/29/17   PT Start Time 1147   PT Stop Time 1249   PT Time Calculation (min) 62 min   Activity Tolerance Patient tolerated treatment well;No increased pain   Behavior During Therapy WFL for tasks assessed/performed      Past Medical History:  Diagnosis Date  . Essential tremor   . Gout   . Hyperlipidemia   . Hypertension   . Parkinson's disease (HCC)   . Prediabetes   . Proteinuria   . Vitamin D deficiency     Past Surgical History:  Procedure Laterality Date  . CYST EXCISION     back  . KNEE SURGERY Right   . TONSILLECTOMY      There were no vitals filed for this visit.      Subjective Assessment - 02/28/17 1149    Subjective "I felt pretty good yesterday".  His knees are bothering him today from climbing up and down from tractor yesterday (tree fell down and needed to be moved).   Pt's wife observes that pt is more aware of his posture and often asks how it looks.   He has been completing HEP daily.     Patient is accompained by: Family member  wife   Pertinent History Spinal stenosis; HNP cervical and lumbar disc; bone spurs; kidney; SOB; cardiac - ventricular problem decresaed ejection fraction; fx patella and scope Rt knee in remote past; Lt knee pain    Currently in Pain? Yes   Pain Score 5    Pain Location Back   Pain Orientation Left;Right   Aggravating Factors  bending over forward   Pain Relieving Factors medication, keeping back straight.             [facility] PT Assessment - 02/28/17 0001      Assessment   Medical  Diagnosis Low back pain; Gait instability    Referring Provider Dr. Lurena Joiner Tat   Onset Date/Surgical Date 02/05/17   Hand Dominance Left   Next MD Visit PRN     Flexibility   Quadriceps RLE 125 deg; LLE 132         [facility] Adult PT Treatment/Exercise - 02/28/17 0001      Lumbar Exercises: Stretches   Prone on Elbows Stretch 3 reps;20 seconds   Quad Stretch Right;Left;3 reps  45 sec, towel above knee. assist to level hips.      Knee/Hip Exercises: Stretches   Hip Flexor Stretch Right;Left;2 reps  45 sec, leg off of edge of bed     Knee/Hip Exercises: Standing   Hip Extension Right;Left;1 set;10 reps;Knee straight   Other Standing Knee Exercises standing trunk ext, with light UE on counter x 2 min, cues for posture   Other Standing Knee Exercises semi-tandem stance x 30 sec each side (with no UE support)     Knee/Hip Exercises: Seated   Sit to Sand 1 set;10 reps;without UE support  core engaged. cues on form.      Knee/Hip Exercises: Supine   Bridges Limitations Bridge x 10 reps, cues to engage  core and breathe.      Modalities   Modalities Electrical Stimulation;Moist Heat     Moist Heat Therapy   Number Minutes Moist Heat 12 Minutes   Moist Heat Location Lumbar Spine     Electrical Stimulation   Electrical Stimulation Location bilat lumbar paraspinals   Electrical Stimulation Action IFC   Electrical Stimulation Parameters to tolerance    Electrical Stimulation Goals Pain                     PT Long Term Goals - 02/24/17 1436      PT LONG TERM GOAL #1   Title Improve posture and alignment with patient to demonstrate improved upright posture wit hincresed extension through trunk, hips and knees in standing 03/29/17   Time 6   Period Weeks   Status On-going     PT LONG TERM GOAL #2   Title Increase strength bilat hips to 5/5 to 5/5 throughout 03/29/17   Time 6   Period Weeks   Status On-going     PT LONG TERM GOAL #3   Title Increase hip  extension bilaterally to 10 degrees to improve posture and balance 03/29/17   Time 6   Period Weeks   Status On-going     PT LONG TERM GOAL #4   Title Patient to be independent in ambulation for functional distances of 300-500 ft with appropriate assistive device 03/29/17   Time 6   Period Weeks     PT LONG TERM GOAL #5   Title Independent in HEP 03/29/17   Time 6   Period Weeks   Status On-going     PT LONG TERM GOAL #6   Title Improve FOTO to </= 42% limitation 03/29/17   Time 6   Period Weeks   Status On-going               Plan - 02/28/17 1239    Clinical Impression Statement Pt's quad flexibility much improved since last visit.  He had a challenging time completing standing hip ext. Pt requires freq  cues for upright posture when in standing.  He reported decrease in LBP after prone quad/LB stretches; further reduction with use of estim/MHP at end of session.   Progressing towards goals.    Clinical Impairments Affecting Rehab Potential decreased endurance and exercise tolerance due to cardiac problems; long standing decline in ambulatory status    PT Frequency 2x / week   PT Duration 6 weeks   PT Treatment/Interventions Patient/family education;ADLs/Self Care Home Management;Electrical Stimulation;Iontophoresis 4mg /ml Dexamethasone;Moist Heat;Ultrasound;Dry needling;Manual techniques;Neuromuscular re-education;Therapeutic activities;Therapeutic exercise;Gait training   PT Next Visit Plan Berg balance test; progress HEP as tolerated.  Educate pt on gait with rollator if purchased.    Consulted and Agree with Plan of Care Patient;Family member/caregiver      Patient will benefit from skilled therapeutic intervention in order to improve the following deficits and impairments:  Postural dysfunction, Improper body mechanics, Abnormal gait, Decreased strength, Decreased mobility, Decreased range of motion, Decreased activity tolerance  Visit Diagnosis: Other abnormalities of  gait and mobility  Abnormal posture  Muscle weakness (generalized)     Problem List Patient Active Problem List   Diagnosis Date Noted  . Skin ulcer of second toe, right, limited to breakdown of skin (HCC) 09/09/2016  . BMI 29.0-29.9,adult 06/11/2015  . Gastric ulcer 01/14/2015  . Morbid obesity (BMI 30+) 08/26/2014  . Sinus pause   With blocked PACs, PVCs and sinus arrhythmia 10/10/2013  .  Medication management 10/06/2013  . Hyperlipidemia 08/27/2013  . Essential tremor   . Proteinuria   . Prediabetes   . Gout   . Vitamin D deficiency   . Left rotator cuff syndrome 06/11/2012  . Essential hypertension 10/10/2010   Mayer Camel, PTA 02/28/17 12:42 PM  [facility] [place] [address] [place], [place], [address] Phone: [phone]   Fax:  [phone]  Name: Leroy Griffin MRN: [id number] Date of Birth: 1945-11-01

## 2017-03-03 ENCOUNTER — Ambulatory Visit (INDEPENDENT_AMBULATORY_CARE_PROVIDER_SITE_OTHER): Payer: Medicare Other | Admitting: Physical Therapy

## 2017-03-03 ENCOUNTER — Other Ambulatory Visit: Payer: Self-pay | Admitting: Internal Medicine

## 2017-03-03 DIAGNOSIS — R293 Abnormal posture: Secondary | ICD-10-CM | POA: Diagnosis not present

## 2017-03-03 DIAGNOSIS — M6281 Muscle weakness (generalized): Secondary | ICD-10-CM

## 2017-03-03 DIAGNOSIS — R2689 Other abnormalities of gait and mobility: Secondary | ICD-10-CM

## 2017-03-03 DIAGNOSIS — I10 Essential (primary) hypertension: Secondary | ICD-10-CM

## 2017-03-03 NOTE — Therapy (Signed)
[facility] [place] [address] [place], [place], [address] Phone: [phone]   Fax:  [phone]  Physical Therapy Treatment  Patient Details  Name: Leroy StageHeino W Carsten MRN: [id number] Date of Birth: 10/24/1945 Referring Provider: Dr. Lurena Joinerebecca Tat  Encounter Date: 03/03/2017      PT End of Session - 03/03/17 1114    Visit Number 4   Number of Visits 12   Date for PT Re-Evaluation 03/29/17   PT Start Time 1107   PT Stop Time 1209   PT Time Calculation (min) 62 min   Activity Tolerance Patient tolerated treatment well;No increased pain   Behavior During Therapy WFL for tasks assessed/performed      Past Medical History:  Diagnosis Date  . Essential tremor   . Gout   . Hyperlipidemia   . Hypertension   . Parkinson's disease (HCC)   . Prediabetes   . Proteinuria   . Vitamin D deficiency     Past Surgical History:  Procedure Laterality Date  . CYST EXCISION     back  . KNEE SURGERY Right   . TONSILLECTOMY      There were no vitals filed for this visit.      Subjective Assessment - 03/03/17 1115    Subjective Pt reports his knees are bothering him from working on tractor yesterday. He has ordered Rollator and TENS.    Patient is accompained by: Family member  wife, French Anaracy   Pertinent History Spinal stenosis; HNP cervical and lumbar disc; bone spurs; kidney; SOB; cardiac - ventricular problem decresaed ejection fraction; fx patella and scope Rt knee in remote past; Lt knee pain    Patient Stated Goals improve ability to walk    Currently in Pain? Yes   Pain Score 3    Pain Location Back   Pain Orientation Left;Right   Pain Descriptors / Indicators Dull   Multiple Pain Sites Yes   Pain Score 3   Pain Location Knee   Pain Orientation Left;Right   Pain Descriptors / Indicators Aching   Aggravating Factors  sitting too long, climbing stairs    Pain Relieving Factors medication, sitting            [facility] PT  Assessment - 03/03/17 0001      Assessment   Medical Diagnosis Low back pain; Gait instability    Referring Provider Dr. Lurena Joinerebecca Tat   Onset Date/Surgical Date 02/05/17   Hand Dominance Left   Next MD Visit PRN          [facility]/Exercise - 03/03/17 0001      Knee/Hip Exercises: Stretches   Gastroc Stretch Right;Left;2 reps;30 seconds     Knee/Hip Exercises: Aerobic   Nustep L4: 6 min      Knee/Hip Exercises: Standing   SLS Rt/Lt SLS with light UE support x 10 sec each leg; SLS no support x 1 rep each side -able to balance 3 sec each leg.    Other Standing Knee Exercises Standing trunk ext (to neutral) x 30 sec x 2 reps. toe taps to 3" step with light/no UE support x 5 reps each leg, 2 sets with seated rest break in between.     Other Standing Knee Exercises semi-tandem stance x 30 sec each side x 2 reps (with no UE support)     Knee/Hip Exercises: Seated   Sit to Sand 1 set;10 reps;without UE support  core engaged.     Moist Heat Therapy  Number Minutes Moist Heat 15 Minutes   Moist Heat Location Lumbar Spine     Electrical Stimulation   Electrical Stimulation Location bilat lumbar paraspinals   Electrical Stimulation Action IFC   Electrical Stimulation Parameters to tolerance    Electrical Stimulation Goals Pain                PT Education - 03/03/17 1206    Education provided Yes   Education Details HEP   Person(s) Educated Patient   Methods Explanation;Handout;Tactile cues;Demonstration;Verbal cues   Comprehension Verbalized understanding;Returned demonstration             PT Long Term Goals - 03/03/17 1220      PT LONG TERM GOAL #1   Title Improve posture and alignment with patient to demonstrate improved upright posture with increased extension through trunk, hips and knees in standing 03/29/17   Time 6   Period Weeks   Status On-going  improving     PT LONG TERM GOAL #2   Title Increase strength bilat hips to 5-/5 to 5/5  throughout 03/29/17   Time 6   Period Weeks   Status On-going     PT LONG TERM GOAL #3   Title Increase hip extension bilaterally to 10 degrees to improve posture and balance 03/29/17   Time 6   Period Weeks   Status On-going     PT LONG TERM GOAL #4   Title Patient to be independent in ambulation for functional distances of 300-500 ft with appropriate assistive device 03/29/17   Period Weeks   Status On-going  ongoing     PT LONG TERM GOAL #5   Title Independent in HEP 03/29/17   Time 6   Period Weeks   Status On-going     PT LONG TERM GOAL #6   Title Improve FOTO to </= 42% limitation 03/29/17   Time 6   Period Weeks   Status On-going               Plan - 03/03/17 1221    Clinical Impression Statement Pt now ambulating into clinic without the secondary assistance of wife. He tolerated all exercises well, without increase in LBP.  He demonstrated decreased balance with SLS exercises, fatiguing quickly and requiring light UE support.  Pt reported elimination of knee pain with exercise, and LBP with use of estim/MHP at end of session. Pt progressing towards goals.    Clinical Impairments Affecting Rehab Potential decreased endurance and exercise tolerance due to cardiac problems; long standing decline in ambulatory status    PT Frequency 2x / week   PT Duration 6 weeks   PT Treatment/Interventions Patient/family education;ADLs/Self Care Home Management;Electrical Stimulation;Iontophoresis 4mg /ml Dexamethasone;Moist Heat;Ultrasound;Dry needling;Manual techniques;Neuromuscular re-education;Therapeutic activities;Therapeutic exercise;Gait training   PT Next Visit Plan Berg balance test; progress HEP as tolerated. Educate pt and wife on TENS.    Consulted and Agree with Plan of Care Patient;Family member/caregiver      Patient will benefit from skilled therapeutic intervention in order to improve the following deficits and impairments:     Visit Diagnosis: Other  abnormalities of gait and mobility  Abnormal posture  Muscle weakness (generalized)     Problem List Patient Active Problem List   Diagnosis Date Noted  . Skin ulcer of second toe, right, limited to breakdown of skin (HCC) 09/09/2016  . BMI 29.0-29.9,adult 06/11/2015  . Gastric ulcer 01/14/2015  . Morbid obesity (BMI 30+) 08/26/2014  . Sinus pause   With blocked PACs, PVCs  and sinus arrhythmia 10/10/2013  . Medication management 10/06/2013  . Hyperlipidemia 08/27/2013  . Essential tremor   . Proteinuria   . Prediabetes   . Gout   . Vitamin D deficiency   . Left rotator cuff syndrome 06/11/2012  . Essential hypertension 10/10/2010   Mayer CamelJennifer Carlson-Long, PTA 03/03/17 12:33 PM  [facility] [address] [place] [address] [place] [address] [place], [place], [address] Phone: [phone]   Fax:  [phone]  Name: Leroy StageHeino W Breisch MRN: [id number] Date of Birth: 12/30/1945

## 2017-03-03 NOTE — Patient Instructions (Signed)
Knee Extension: Sit to Stand (Eccentric)    Stand close to chair. Slowly lower self to seated position. _5-10__ reps per set, _1__ sets per day, _4__ days per week. Progress to stopping midway before lowering to chair. Progress to barely touching chair.  Tandem Stance    NEXT TO COUNTER: Right foot in front of left, heel touching toe both feet "straight ahead". Stand on Foot Triangle of Support with both feet. Balance in this position _30__ seconds. Do with left foot in front of right.  Calf Stretch    Place hands on wall at shoulder height. Keeping back leg straight, bend front leg, feet pointing forward, heels flat on floor. Lean forward slightly until stretch is felt in calf of back leg. Hold stretch _30__ seconds, breathing slowly in and out. Repeat stretch with other leg back.  2 reps  Do __1-2_ sessions per day. Variation: Use chair or table for support.   [facility] [address] [place], [place] [address]  [phone] (office) [phone] (fax)

## 2017-03-07 ENCOUNTER — Ambulatory Visit (INDEPENDENT_AMBULATORY_CARE_PROVIDER_SITE_OTHER): Payer: Medicare Other | Admitting: Physical Therapy

## 2017-03-07 DIAGNOSIS — R293 Abnormal posture: Secondary | ICD-10-CM | POA: Diagnosis not present

## 2017-03-07 DIAGNOSIS — M6281 Muscle weakness (generalized): Secondary | ICD-10-CM

## 2017-03-07 DIAGNOSIS — R2689 Other abnormalities of gait and mobility: Secondary | ICD-10-CM

## 2017-03-07 NOTE — Therapy (Signed)
[facility] [place] [address] [place], [place], [address] Phone: [phone]   Fax:  [phone]  Physical Therapy Treatment  Patient Details  Name: Leroy Griffin MRN: [id number] Date of Birth: [birth year] Referring Provider: Dr. Lurena Joiner Tat   Encounter Date: 03/07/2017      PT End of Session - 03/07/17 1200    Visit Number 5   Number of Visits 12   Date for PT Re-Evaluation 03/29/17   PT Start Time 1148   PT Stop Time 1238   PT Time Calculation (min) 50 min   Activity Tolerance Patient tolerated treatment well;No increased pain   Behavior During Therapy WFL for tasks assessed/performed      Past Medical History:  Diagnosis Date  . Essential tremor   . Gout   . Hyperlipidemia   . Hypertension   . Parkinson's disease (HCC)   . Prediabetes   . Proteinuria   . Vitamin D deficiency     Past Surgical History:  Procedure Laterality Date  . CYST EXCISION     back  . KNEE SURGERY Right   . TONSILLECTOMY      There were no vitals filed for this visit.      Subjective Assessment - 03/07/17 1204    Subjective Pt reports he feels like his posture is improving (confirmed by wife, French Ana). He feels his balance is improving. He states he has more awareness of when he is forward flexed and has tried to correct it.    Pertinent History Spinal stenosis; HNP cervical and lumbar disc; bone spurs; kidney; SOB; cardiac - ventricular problem decresaed ejection fraction; fx patella and scope Rt knee in remote past; Lt knee pain    Currently in Pain? No/denies   Pain Score 0-No pain            [facility] PT Assessment - 03/07/17 0001      Assessment   Medical Diagnosis Low back pain; Gait instability    Referring Provider Dr. Lurena Joiner Tat    Onset Date/Surgical Date 02/05/17   Hand Dominance Left   Next MD Visit PRN   Prior Therapy none for back/balance     Standardized Balance Assessment   Standardized Balance Assessment  Berg Balance Test     Berg Balance Test   Sit to Stand Able to stand  independently using hands   Standing Unsupported Able to stand safely 2 minutes   Sitting with Back Unsupported but Feet Supported on Floor or Stool Able to sit safely and securely 2 minutes   Stand to Sit Sits safely with minimal use of hands   Transfers Able to transfer safely, minor use of hands   Standing Unsupported with Eyes Closed Able to stand 10 seconds safely   Standing Ubsupported with Feet Together Able to place feet together independently and stand 1 minute safely   From Standing, Reach Forward with Outstretched Arm Can reach confidently >25 cm (10")   From Standing Position, Pick up Object from Floor Able to pick up shoe safely and easily   From Standing Position, Turn to Look Behind Over each Shoulder Looks behind from both sides and weight shifts well   Turn 360 Degrees Able to turn 360 degrees safely but slowly   Standing Unsupported, Alternately Place Feet on Step/Stool Able to complete 4 steps without aid or supervision   Standing Unsupported, One Foot in Front Able to take small step independently and hold 30 seconds   Standing on One  Leg Tries to lift leg/unable to hold 3 seconds but remains standing independently   Total Score 46   Berg comment: scores 45 or less are predictive of increased fall risk.                      [facility] Adult PT Treatment/Exercise - 03/07/17 0001      Self-Care   Self-Care Other Self-Care Comments   Other Self-Care Comments  Pt and Pt's wife educated on parameters, set up, and safe use of TENS unit.  they both verbalized understanding and returned demo.       Lumbar Exercises: Standing   Other Standing Lumbar Exercises Toe taps to 6" step x 10 reps;  semi-tandem stance x 30 sec each side.   feet together balancing for 1 min, eyes closed for 10 sec.  no LOB.  Forward step ups on 3" step with UE support x 8 reps each leg (RLE more challenging)      Knee/Hip  Exercises: Stretches   Gastroc Stretch Right;Left;2 reps;30 seconds     Knee/Hip Exercises: Aerobic   Recumbent Bike L2: 5 min      Knee/Hip Exercises: Seated   Sit to Sand 1 set;10 reps;without UE support  core engaged.     Modalities   Modalities --  declined; will use TENS at home.                      PT Long Term Goals - 03/03/17 1220      PT LONG TERM GOAL #1   Title Improve posture and alignment with patient to demonstrate improved upright posture with increased extension through trunk, hips and knees in standing 03/29/17   Time 6   Period Weeks   Status On-going  improving     PT LONG TERM GOAL #2   Title Increase strength bilat hips to 5-/5 to 5/5 throughout 03/29/17   Time 6   Period Weeks   Status On-going     PT LONG TERM GOAL #3   Title Increase hip extension bilaterally to 10 degrees to improve posture and balance 03/29/17   Time 6   Period Weeks   Status On-going     PT LONG TERM GOAL #4   Title Patient to be independent in ambulation for functional distances of 300-500 ft with appropriate assistive device 03/29/17   Period Weeks   Status On-going  ongoing     PT LONG TERM GOAL #5   Title Independent in HEP 03/29/17   Time 6   Period Weeks   Status On-going     PT LONG TERM GOAL #6   Title Improve FOTO to </= 42% limitation 03/29/17   Time 6   Period Weeks   Status On-going               Plan - 03/07/17 1256    Clinical Impression Statement Pt scored 46 on Berg test today; scores 45 or less are predictive of increased risk of falls.  He tolerated all exercises without increase in back pain, nor loss of balance.  Time spent educating pt and his wife regarding TENS application and use.  Pt is progressing well towards goals, reporting increased awareness of posture and less back pain occurence.    Rehab Potential Good   Clinical Impairments Affecting Rehab Potential decreased endurance and exercise tolerance due to cardiac  problems; long standing decline in ambulatory status    PT Frequency 2x /  week   PT Duration 6 weeks   PT Treatment/Interventions Patient/family education;ADLs/Self Care Home Management;Electrical Stimulation;Iontophoresis 4mg /ml Dexamethasone;Moist Heat;Ultrasound;Dry needling;Manual techniques;Neuromuscular re-education;Therapeutic activities;Therapeutic exercise;Gait training   PT Next Visit Plan Assess LE strength; progress HEP as tolerated.    Consulted and Agree with Plan of Care Patient;Family member/caregiver      Patient will benefit from skilled therapeutic intervention in order to improve the following deficits and impairments:  Postural dysfunction, Improper body mechanics, Abnormal gait, Decreased strength, Decreased mobility, Decreased range of motion, Decreased activity tolerance  Visit Diagnosis: Other abnormalities of gait and mobility  Abnormal posture  Muscle weakness (generalized)     Problem List Patient Active Problem List   Diagnosis Date Noted  . Skin ulcer of second toe, right, limited to breakdown of skin (HCC) 09/09/2016  . BMI 29.0-29.9,adult 06/11/2015  . Gastric ulcer 01/14/2015  . Morbid obesity (BMI 30+) 08/26/2014  . Sinus pause   With blocked PACs, PVCs and sinus arrhythmia 10/10/2013  . Medication management 10/06/2013  . Hyperlipidemia 08/27/2013  . Essential tremor   . Proteinuria   . Prediabetes   . Gout   . Vitamin D deficiency   . Left rotator cuff syndrome 06/11/2012  . Essential hypertension 10/10/2010   Mayer CamelJennifer Carlson-Long, PTA 03/07/17 1:01 PM  [facility] [address] [place], [place], [address] Phone: [phone]   Fax:  [phone]  Name: Leroy Griffin MRN: [id number] Date of Birth: 06/15/1946

## 2017-03-10 ENCOUNTER — Ambulatory Visit (INDEPENDENT_AMBULATORY_CARE_PROVIDER_SITE_OTHER): Payer: Medicare Other | Admitting: Physical Therapy

## 2017-03-10 DIAGNOSIS — R293 Abnormal posture: Secondary | ICD-10-CM

## 2017-03-10 DIAGNOSIS — M6281 Muscle weakness (generalized): Secondary | ICD-10-CM

## 2017-03-10 DIAGNOSIS — R2689 Other abnormalities of gait and mobility: Secondary | ICD-10-CM

## 2017-03-10 NOTE — Therapy (Signed)
[facility] [address] [place], [place], [address] Phone: [phone]   Fax:  [phone]  Physical Therapy Treatment  Patient Details  Name: Leroy StageHeino W Brazzle MRN: [id number] Date of Birth: 04/05/1946 Referring Provider: Dr. Lurena Joinerebecca Tat   Encounter Date: 03/10/2017      PT End of Session - 03/10/17 1113    Visit Number 6   Number of Visits 12   Date for PT Re-Evaluation 03/29/17   PT Start Time 1105   PT Stop Time 1148   PT Time Calculation (min) 43 min   Activity Tolerance Patient tolerated treatment well   Behavior During Therapy [facility] for tasks assessed/performed      Past Medical History:  Diagnosis Date  . Essential tremor   . Gout   . Hyperlipidemia   . Hypertension   . Parkinson's disease (HCC)   . Prediabetes   . Proteinuria   . Vitamin D deficiency     Past Surgical History:  Procedure Laterality Date  . CYST EXCISION     back  . KNEE SURGERY Right   . TONSILLECTOMY      There were no vitals filed for this visit.      Subjective Assessment - 03/10/17 1113    Subjective Pt reports it has been a rough two days. He thinks the weather is contributing to his back hurting more.  He used his TENS at home for pain relief.  His rollator should be delivered Monday.    Patient is accompained by: Family member  wife, French Anaracy   Currently in Pain? Yes   Pain Score 6    Pain Location Back   Pain Orientation Lower   Pain Descriptors / Indicators Sharp   Aggravating Factors  bending over   Pain Relieving Factors keeping back straight, medicine, heat            [facility] PT Assessment - 03/10/17 0001      Strength   Right/Left Hip Right;Left   Right Hip Flexion 5/5   Right Hip Extension 3+/5   Left Hip Flexion --  5-5   Left Hip Extension 4-/5   Left Hip ABduction 4+/5          [facility] Adult PT Treatment/Exercise - 03/10/17 0001      Lumbar Exercises: Standing   Other Standing Lumbar Exercises  Standing hip ext x 12 reps each leg (VC for form)     Lumbar Exercises: Supine   Bridge 3 seconds;15 reps     Knee/Hip Exercises: Stretches   Theme park managerGastroc Stretch Right;Left;2 reps;60 seconds     Knee/Hip Exercises: Aerobic   Recumbent Bike L2: 6 min      Knee/Hip Exercises: Standing   SLS Rt/Lt SLS x 15 sec with light UE support x 2 reps each leg   Other Standing Knee Exercises Standing trunk ext (to neutral) x 30 sec x 2 reps. toe taps to 3" step with no UE support x 10 reps each leg,then alternating (with CGA for safety)    Other Standing Knee Exercises semi-tandem stance x 30 sec each side x 2 reps (with no UE support)     Modalities   Modalities --  declined; will use TENS at home.            PT Long Term Goals - 03/03/17 1220      PT LONG TERM GOAL #1   Title Improve posture and alignment with patient to demonstrate improved upright posture with increased  extension through trunk, hips and knees in standing 03/29/17   Time 6   Period Weeks   Status On-going  improving     PT LONG TERM GOAL #2   Title Increase strength bilat hips to 5-/5 to 5/5 throughout 03/29/17   Time 6   Period Weeks   Status On-going     PT LONG TERM GOAL #3   Title Increase hip extension bilaterally to 10 degrees to improve posture and balance 03/29/17   Time 6   Period Weeks   Status On-going     PT LONG TERM GOAL #4   Title Patient to be independent in ambulation for functional distances of 300-500 ft with appropriate assistive device 03/29/17   Period Weeks   Status On-going  ongoing     PT LONG TERM GOAL #5   Title Independent in HEP 03/29/17   Time 6   Period Weeks   Status On-going     PT LONG TERM GOAL #6   Title Improve FOTO to </= 42% limitation 03/29/17   Time 6   Period Weeks   Status On-going               Plan - 03/10/17 1156    Clinical Impression Statement Pt's hip strength has improved.  He still demonstrates some functional weakness in Rt hip with SLS exercise.  Standing hip ext exercise caused some increase in back pain; reduced with seated rest.  Pt continues to require freq tactile/VC for improved posture in standing.  Progressing towards goals.    Rehab Potential Good   PT Frequency 2x / week   PT Duration 6 weeks   PT Treatment/Interventions Patient/family education;ADLs/Self Care Home Management;Electrical Stimulation;Iontophoresis 4mg /ml Dexamethasone;Moist Heat;Ultrasound;Dry needling;Manual techniques;Neuromuscular re-education;Therapeutic activities;Therapeutic exercise;Gait training   PT Next Visit Plan Continue progressive hip and postural strengthening; balance.    Consulted and Agree with Plan of Care Patient;Family member/caregiver      Patient will benefit from skilled therapeutic intervention in order to improve the following deficits and impairments:  Postural dysfunction, Improper body mechanics, Abnormal gait, Decreased strength, Decreased mobility, Decreased range of motion, Decreased activity tolerance  Visit Diagnosis: Other abnormalities of gait and mobility  Abnormal posture  Muscle weakness (generalized)     Problem List Patient Active Problem List   Diagnosis Date Noted  . Skin ulcer of second toe, right, limited to breakdown of skin (HCC) 09/09/2016  . BMI 29.0-29.9,adult 06/11/2015  . Gastric ulcer 01/14/2015  . Morbid obesity (BMI 30+) 08/26/2014  . Sinus pause   With blocked PACs, PVCs and sinus arrhythmia 10/10/2013  . Medication management 10/06/2013  . Hyperlipidemia 08/27/2013  . Essential tremor   . Proteinuria   . Prediabetes   . Gout   . Vitamin D deficiency   . Left rotator cuff syndrome 06/11/2012  . Essential hypertension 10/10/2010   Mayer CamelJennifer Carlson-Long, PTA 03/10/17 12:05 PM  [facility] [address] [place], [place], [address] Phone: [phone]   Fax:  [phone]  Name: Leroy StageHeino W Gautier MRN: [id number] Date of Birth:  11/29/1945

## 2017-03-14 ENCOUNTER — Ambulatory Visit (INDEPENDENT_AMBULATORY_CARE_PROVIDER_SITE_OTHER): Payer: Medicare Other | Admitting: Rehabilitative and Restorative Service Providers"

## 2017-03-14 ENCOUNTER — Encounter: Payer: Self-pay | Admitting: Rehabilitative and Restorative Service Providers"

## 2017-03-14 DIAGNOSIS — R2689 Other abnormalities of gait and mobility: Secondary | ICD-10-CM | POA: Diagnosis present

## 2017-03-14 DIAGNOSIS — R293 Abnormal posture: Secondary | ICD-10-CM | POA: Diagnosis not present

## 2017-03-14 DIAGNOSIS — M6281 Muscle weakness (generalized): Secondary | ICD-10-CM | POA: Diagnosis not present

## 2017-03-14 NOTE — Patient Instructions (Signed)
Resisted External Rotation: in Neutral - Bilateral   PALMS UP Sit or stand, tubing in both hands, elbows at sides, bent to 90, forearms forward. Pinch shoulder blades together and rotate forearms out. Keep elbows at sides. Repeat __10__ times per set. Do _2-3___ sets per session. Do _1___ sessions per day.   Low Row: Standing   Face anchor, feet shoulder width apart. Palms up, pull arms back, squeezing shoulder blades together. Repeat 10__ times per set. Do 2-3__ sets per session. Do 1__ sessions per day. Anchor Height: Waist   Strengthening: Resisted Extension   Hold tubing in right hand, arm forward. Pull arm back, elbow straight. Repeat _10___ times per set. Do 2-3____ sets per session. Do 1____ sessions per day.    Sash    standing, knees bent, feet flat, left hand on left hip, right hand above left. Pull right arm DIAGONALLY (hip to shoulder) across chest. Bring right arm along head toward floor. Hold momentarily. Slowly return to starting position. Repeat __10-20_ times. Do with left arm.

## 2017-03-14 NOTE — Therapy (Signed)
[facility] [place] [address] [place], [place], [address] Phone: [phone]   Fax:  [phone]  Physical Therapy Treatment  Patient Details  Name: Leroy Griffin MRN: [id number] Date of Birth: 1946-04-18 Referring Provider: Dr Lurena Joiner Tat  Encounter Date: 03/14/2017      PT End of Session - 03/14/17 1112    Visit Number 7   Number of Visits 12   Date for PT Re-Evaluation 03/29/17   PT Start Time 1109   PT Stop Time 1148   PT Time Calculation (min) 39 min   Activity Tolerance Patient tolerated treatment well      Past Medical History:  Diagnosis Date  . Essential tremor   . Gout   . Hyperlipidemia   . Hypertension   . Parkinson's disease (HCC)   . Prediabetes   . Proteinuria   . Vitamin D deficiency     Past Surgical History:  Procedure Laterality Date  . CYST EXCISION     back  . KNEE SURGERY Right   . TONSILLECTOMY      There were no vitals filed for this visit.      Subjective Assessment - 03/14/17 1113    Subjective Did not sleep well last night - tired today. Using TENS unit at home. Did get the rolator in and will bring it at next appointment to check the height.    Currently in Pain? No/denies            [facility] PT Assessment - 03/14/17 0001      Assessment   Medical Diagnosis Low back pain; Gait instability    Referring Provider Dr Lurena Joiner Tat   Onset Date/Surgical Date 02/05/17   Hand Dominance Left   Next MD Visit PRN   Prior Therapy none for back/balance     Strength   Right Hip Flexion 5/5   Right Hip Extension 3+/5   Left Hip Flexion --  5-5   Left Hip Extension 4-/5   Left Hip ABduction 4+/5                     [facility] Adult PT Treatment/Exercise - 03/14/17 0001      Ambulation/Gait   Gait Comments working on gait with SPC with verbal cues for 3 point gait pattern with proper placement of cane and equal stride length standby assist only - improved stability with  gait compared with initial evaluation      Lumbar Exercises: Standing   Scapular Retraction Strengthening;Both;20 reps   Theraband Level (Scapular Retraction) Level 2 (Red)   Row Strengthening;Both;20 reps   Theraband Level (Row) Level 2 (Red)   Shoulder Extension Strengthening;Both;20 reps   Theraband Level (Shoulder Extension) Level 2 (Red)   Other Standing Lumbar Exercises Standing hip ext x 10 reps 2 sets each leg (VC for form)   Other Standing Lumbar Exercises back to wall working on trunk and hip extension; pushing hips away from wall x 10;      Knee/Hip Exercises: Aerobic   Recumbent Bike L2: 6 min      Knee/Hip Exercises: Standing   Hip Extension Right;Left;10 reps;Knee straight;2 sets     Knee/Hip Exercises: Seated   Sit to Sand 5 reps  very slow stand to sit                 PT Education - 03/14/17 1137    Education provided Yes   Education Details HEP    Person(s) Educated  Patient   Methods Explanation;Demonstration;Tactile cues;Verbal cues;Handout   Comprehension Verbalized understanding;Returned demonstration;Verbal cues required;Tactile cues required             PT Long Term Goals - 03/14/17 1119      PT LONG TERM GOAL #1   Title Improve posture and alignment with patient to demonstrate improved upright posture with increased extension through trunk, hips and knees in standing 03/29/17   Time 6   Period Weeks   Status On-going     PT LONG TERM GOAL #2   Title Increase strength bilat hips to 5-/5 to 5/5 throughout 03/29/17   Time 6   Period Weeks   Status On-going     PT LONG TERM GOAL #3   Title Increase hip extension bilaterally to 10 degrees to improve posture and balance 03/29/17   Time 6   Period Weeks   Status On-going     PT LONG TERM GOAL #4   Title Patient to be independent in ambulation for functional distances of 300-500 ft with appropriate assistive device 03/29/17   Time 6   Period Weeks   Status On-going     PT LONG TERM  GOAL #5   Title Independent in HEP 03/29/17   Time 6   Period Weeks   Status On-going               Plan - 03/14/17 1114    Clinical Impression Statement Cotninued gradual improvement in strength. Patient continues to have difficulty with balance activities. He has poor posture and alignment with trunk and hips flexed. Continues to progress toward stated goals of thherapy.    Rehab Potential Good   Clinical Impairments Affecting Rehab Potential decreased endurance and exercise tolerance due to cardiac problems; long standing decline in ambulatory status    PT Frequency 2x / week   PT Duration 6 weeks   PT Treatment/Interventions Patient/family education;ADLs/Self Care Home Management;Electrical Stimulation;Iontophoresis 4mg /ml Dexamethasone;Moist Heat;Ultrasound;Dry needling;Manual techniques;Neuromuscular re-education;Therapeutic activities;Therapeutic exercise;Gait training   PT Next Visit Plan Continue progressive hip and postural strengthening; balance.    Consulted and Agree with Plan of Care Patient      Patient will benefit from skilled therapeutic intervention in order to improve the following deficits and impairments:  Postural dysfunction, Improper body mechanics, Abnormal gait, Decreased strength, Decreased mobility, Decreased range of motion, Decreased activity tolerance  Visit Diagnosis: Other abnormalities of gait and mobility  Abnormal posture  Muscle weakness (generalized)     Problem List Patient Active Problem List   Diagnosis Date Noted  . Skin ulcer of second toe, right, limited to breakdown of skin (HCC) 09/09/2016  . BMI 29.0-29.9,adult 06/11/2015  . Gastric ulcer 01/14/2015  . Morbid obesity (BMI 30+) 08/26/2014  . Sinus pause   With blocked PACs, PVCs and sinus arrhythmia 10/10/2013  . Medication management 10/06/2013  . Hyperlipidemia 08/27/2013  . Essential tremor   . Proteinuria   . Prediabetes   . Gout   . Vitamin D deficiency   . Left  rotator cuff syndrome 06/11/2012  . Essential hypertension 10/10/2010    Skyelyn Scruggs Rober MinionP Porschea Borys PT, MPH  03/14/2017, 11:51 AM  [facility] [address] [place], [place], [address] Phone: [phone]   Fax:  [phone]  Name: Leroy Griffin MRN: [id number] Date of Birth: 02/07/1946

## 2017-03-16 ENCOUNTER — Ambulatory Visit (INDEPENDENT_AMBULATORY_CARE_PROVIDER_SITE_OTHER): Payer: Medicare Other | Admitting: Internal Medicine

## 2017-03-16 ENCOUNTER — Encounter: Payer: Self-pay | Admitting: Internal Medicine

## 2017-03-16 VITALS — BP 122/70 | HR 64 | Ht 72.0 in | Wt 231.0 lb

## 2017-03-16 DIAGNOSIS — I491 Atrial premature depolarization: Secondary | ICD-10-CM | POA: Diagnosis not present

## 2017-03-16 DIAGNOSIS — I493 Ventricular premature depolarization: Secondary | ICD-10-CM

## 2017-03-16 NOTE — Patient Instructions (Signed)
Your physician recommends that you continue on your current medications as directed. Please refer to the Current Medication list given to you today.   Your physician wants you to follow-up in: YEAR WITH DR KLEIN  You will receive a reminder letter in the mail two months in advance. If you don't receive a letter, please call our office to schedule the follow-up appointment.  

## 2017-03-16 NOTE — Progress Notes (Signed)
Patient Care Team: Lucky Cowboy, MD as PCP - General (Internal Medicine) Sharyn Lull, Elvin So, MD as Referring Physician (Dermatology) Mittie Bodo, MD as Referring Physician (Cardiology) Annie Sable, MD as Consulting Physician (Nephrology) Marney Setting, MD as Referring Physician (Gastroenterology)   HPI  Leroy Griffin is a 71 y.o. male Seen in follow-up for dyspnea on exertion associated with mild abnormalities on Myoview scan and echocardiogram with an ejection fraction of 45-50%  Further assessment of LV function by MR scanning demonstrated mild LVH depression EF 51% without evidence of gadolinium enhancement  Cardiopulmonary stress testing demonstrated "no evidence of overt ventilatory or circulatory limitation. At peak exercise, patient appears limited due to deconditioning and his body habitus. There is evidence of mild chronotropic incompetence." Maximum heart rate was 114   His last months been a struggle. He has been diagnosed initially with Parkinson's; further evaluation apparently suggested this was not the issue. His balance related to arthritis.  No chest pain or shortness of breath but his ambulatory limitations are his greatest  Records and Results Reviewed outpt records and imaging   Past Medical History:  Diagnosis Date  . Essential tremor   . Gout   . Hyperlipidemia   . Hypertension   . Parkinson's disease (HCC)   . Prediabetes   . Proteinuria   . Vitamin D deficiency     Past Surgical History:  Procedure Laterality Date  . CYST EXCISION     back  . KNEE SURGERY Right   . TONSILLECTOMY      Current Outpatient Prescriptions  Medication Sig Dispense Refill  . acetaminophen-codeine (TYLENOL #3) 300-30 MG tablet TAKE 1 TABLET BY MOUTH FOUR TIMES DAILY AS NEEDED FOR PAIN 100 tablet 0  . allopurinol (ZYLOPRIM) 300 MG tablet TAKE 1/2 TO 1 TABLET BY MOUTH DAILY AS DIRECTED FOR GOUT 90 tablet 1  . atorvastatin (LIPITOR) 20 MG  tablet Take 20 mg by mouth daily.    Marland Kitchen atorvastatin (LIPITOR) 20 MG tablet TAKE 1/2 TO 1 TABLET BY MOUTH EVERY DAY FOR CHOLESTEROL 90 tablet 1  . Cholecalciferol (VITAMIN D) 2000 units tablet Take 10,000 Units by mouth every morning.     . diazepam (VALIUM) 5 MG tablet Take 1/2 to 1 tablet 3 x / day as needed for Tremor 270 tablet 1  . Flaxseed, Linseed, (FLAX SEED OIL) 1000 MG CAPS Take 1,000 mg by mouth every evening.     Marland Kitchen losartan (COZAAR) 100 MG tablet TAKE 1/2 TABLET BY MOUTH TWICE DAILY 90 tablet 1  . MAGNESIUM PO Take 50 mg by mouth 2 (two) times daily.    . Minoxidil (ROGAINE MENS) 5 % FOAM Apply 1 application topically daily.    . Omega-3 Fatty Acids (FISH OIL) 1200 MG CPDR Take 2 capsules by mouth at bedtime.     . triamcinolone ointment (KENALOG) 0.1 % Apply 1 application topically 3 (three) times daily. (Patient taking differently: Apply 1 application topically 2 (two) times daily as needed. ) 80 g 3  . vitamin B-12 (CYANOCOBALAMIN) 1000 MCG tablet Take 1,000 mcg by mouth daily.    Marland Kitchen zinc gluconate 50 MG tablet Take 50 mg by mouth daily.     No current facility-administered medications for this visit.     Allergies  Allergen Reactions  . Enalapril Rash  . Aspirin     Internal bleeding  . Nsaids     Internal bleeding  . Welchol [Colesevelam Hcl] Diarrhea  . Vasotec [Enalaprilat] Rash  Review of Systems negative except from HPI and PMH  Physical Exam BP 122/70   Pulse 64   Ht 6' (1.829 m)   Wt 231 lb (104.8 kg)   SpO2 97%   BMI 31.33 kg/m  Well developed and well nourished in no acute distress HENT normal E scleral and icterus clear Neck Supple JVP flat; carotids brisk and full Clear to ausculation  Regular rate and rhythm,2/6 early systolic Soft with active bowel sounds No clubbing cyanosis  Edema Alert and oriented, grossly normal motor and sensory function Skin Warm and Dry  ECG   Sinus at 69 Intervals 21/09/37 Otherwise normal     Impression  PACs/PVCs  Chronotropic incompetence-borderline  Dyspnea on exertion  Cardiomyopathy-mild  Balance issues  Ectopy is quiescent.   Dyspnea is not an issue because of other restrictions.  We will repeat the echocardiogram to try to get her trajectory   The patient's balance issues are interesting. There are described to arthritis. Suggested that he look into different shoes. Also suggested hball room  dancing  We spent more than 50% of our >25 min visit in face to face counseling regarding the above

## 2017-03-17 ENCOUNTER — Ambulatory Visit (INDEPENDENT_AMBULATORY_CARE_PROVIDER_SITE_OTHER): Payer: Medicare Other | Admitting: Rehabilitative and Restorative Service Providers"

## 2017-03-17 ENCOUNTER — Encounter: Payer: Self-pay | Admitting: Rehabilitative and Restorative Service Providers"

## 2017-03-17 DIAGNOSIS — M6281 Muscle weakness (generalized): Secondary | ICD-10-CM | POA: Diagnosis not present

## 2017-03-17 DIAGNOSIS — R2689 Other abnormalities of gait and mobility: Secondary | ICD-10-CM | POA: Diagnosis present

## 2017-03-17 DIAGNOSIS — R293 Abnormal posture: Secondary | ICD-10-CM | POA: Diagnosis not present

## 2017-03-17 NOTE — Therapy (Signed)
[facility] [place] [address] [place], [place], [address] Phone: [phone]   Fax:  [phone]  Physical Therapy Treatment  Patient Details  Name: Leroy Griffin MRN: [id number] Date of Birth: September 29, 1945 Referring Provider: Dr Lurena Joiner Tat  Encounter Date: 03/17/2017      PT End of Session - 03/17/17 1010    Visit Number 8   Number of Visits 12   Date for PT Re-Evaluation 03/29/17   PT Start Time 1006   PT Stop Time 1048   PT Time Calculation (min) 42 min   Activity Tolerance Patient tolerated treatment well      Past Medical History:  Diagnosis Date  . Essential tremor   . Gout   . Hyperlipidemia   . Hypertension   . Parkinson's disease (HCC)   . Prediabetes   . Proteinuria   . Vitamin D deficiency     Past Surgical History:  Procedure Laterality Date  . CYST EXCISION     back  . KNEE SURGERY Right   . TONSILLECTOMY      There were no vitals filed for this visit.      Subjective Assessment - 03/17/17 1011    Subjective Taking pain medication every morning. Using TENS unit every night which helps with pain. Has a rollator and wants to adjust the walker and practice with the rollator.    Currently in Pain? Yes   Pain Score 2    Pain Location Back   Pain Orientation Lower   Pain Descriptors / Indicators Aching   Pain Type Chronic pain   Pain Onset More than a month ago   Pain Frequency Intermittent   Pain Score 3   Pain Location Knee   Pain Orientation Left;Right   Pain Descriptors / Indicators Aching                         [facility] Adult PT Treatment/Exercise - 03/17/17 0001      Ambulation/Gait   Gait Comments working on gait with Nitro rolling walker with verbal cues for gait pattern with proper placement of LE's staying up in the walker with equal stride length standby assist only and verbal/tactile cues. walking over smooth and carpeted surfaces 100/175/750 feet      Neuro  Re-ed    Neuro Re-ed Details  working on standing posture and alignment focus on hip, knee and trunk extension      Lumbar Exercises: Standing   Other Standing Lumbar Exercises Standing hip ext x 10 reps 2 sets each leg (VC for form)   Other Standing Lumbar Exercises back to wall working on trunk and hip extension; pushing hips away from wall x 10;      Knee/Hip Exercises: Aerobic   Recumbent Bike L2: 6 min      Knee/Hip Exercises: Standing   Hip Extension Right;Left;10 reps;Knee straight;2 sets                PT Education - 03/17/17 1059    Education provided Yes   Education Details gait training with rolling walker    Person(s) Educated Patient;Spouse   Methods Explanation;Demonstration;Tactile cues;Verbal cues   Comprehension Verbalized understanding;Returned demonstration;Verbal cues required;Tactile cues required             PT Long Term Goals - 03/14/17 1119      PT LONG TERM GOAL #1   Title Improve posture and alignment with patient to demonstrate improved upright posture  with increased extension through trunk, hips and knees in standing 03/29/17   Time 6   Period Weeks   Status On-going     PT LONG TERM GOAL #2   Title Increase strength bilat hips to 5-/5 to 5/5 throughout 03/29/17   Time 6   Period Weeks   Status On-going     PT LONG TERM GOAL #3   Title Increase hip extension bilaterally to 10 degrees to improve posture and balance 03/29/17   Time 6   Period Weeks   Status On-going     PT LONG TERM GOAL #4   Title Patient to be independent in ambulation for functional distances of 300-500 ft with appropriate assistive device 03/29/17   Time 6   Period Weeks   Status On-going     PT LONG TERM GOAL #5   Title Independent in HEP 03/29/17   Time 6   Period Weeks   Status On-going               Plan - 03/17/17 1100    Clinical Impression Statement Patient presents with Nitro rolling walker for instruction in proper ambulation with rolling  walker. He did well with verbal and tactile cues with wife observing part of treatment sessioin. patient needs continued work on posture and alignment in the walker and practrice staying closer to the walker with gait.    Rehab Potential Good   Clinical Impairments Affecting Rehab Potential decreased endurance and exercise tolerance due to cardiac problems; long standing decline in ambulatory status    PT Frequency 2x / week   PT Duration 6 weeks   PT Treatment/Interventions Patient/family education;ADLs/Self Care Home Management;Electrical Stimulation;Iontophoresis 4mg /ml Dexamethasone;Moist Heat;Ultrasound;Dry needling;Manual techniques;Neuromuscular re-education;Therapeutic activities;Therapeutic exercise;Gait training   PT Next Visit Plan Continue progressive hip and postural strengthening; balance, gait training    Consulted and Agree with Plan of Care Patient      Patient will benefit from skilled therapeutic intervention in order to improve the following deficits and impairments:  Postural dysfunction, Improper body mechanics, Abnormal gait, Decreased strength, Decreased mobility, Decreased range of motion, Decreased activity tolerance  Visit Diagnosis: Other abnormalities of gait and mobility  Abnormal posture  Muscle weakness (generalized)     Problem List Patient Active Problem List   Diagnosis Date Noted  . Skin ulcer of second toe, right, limited to breakdown of skin (HCC) 09/09/2016  . BMI 29.0-29.9,adult 06/11/2015  . Gastric ulcer 01/14/2015  . Morbid obesity (BMI 30+) 08/26/2014  . Sinus pause   With blocked PACs, PVCs and sinus arrhythmia 10/10/2013  . Medication management 10/06/2013  . Hyperlipidemia 08/27/2013  . Essential tremor   . Proteinuria   . Prediabetes   . Gout   . Vitamin D deficiency   . Left rotator cuff syndrome 06/11/2012  . Essential hypertension 10/10/2010    [name] Rober MinionP Tanvir Hipple PT, MPH  03/17/2017, 11:03 AM  [facility] [address] [place], [place], [address] Phone: [phone]   Fax:  [phone]  Name: Leroy Griffin MRN: [id number] Date of Birth: 10/16/1945

## 2017-03-20 ENCOUNTER — Other Ambulatory Visit: Payer: Self-pay | Admitting: Internal Medicine

## 2017-03-20 NOTE — Telephone Encounter (Signed)
Please call T[facility] # 3 

## 2017-03-21 ENCOUNTER — Ambulatory Visit (INDEPENDENT_AMBULATORY_CARE_PROVIDER_SITE_OTHER): Payer: Medicare Other | Admitting: Physical Therapy

## 2017-03-21 DIAGNOSIS — R2689 Other abnormalities of gait and mobility: Secondary | ICD-10-CM | POA: Diagnosis present

## 2017-03-21 DIAGNOSIS — R293 Abnormal posture: Secondary | ICD-10-CM | POA: Diagnosis not present

## 2017-03-21 DIAGNOSIS — M6281 Muscle weakness (generalized): Secondary | ICD-10-CM

## 2017-03-21 NOTE — Therapy (Signed)
[facility] [place] [address] [place] [address] [place], [place], [address] Phone: [phone]   Fax:  [phone]  Physical Therapy Treatment  Patient Details  Name: Leroy Griffin MRN: [id number] Date of Birth: 12/31/1945 Referring Provider: Dr. Lurena Joinerebecca Tat  Encounter Date: 03/21/2017      PT End of Session - 03/21/17 1118    Visit Number 9   Number of Visits 12   Date for PT Re-Evaluation 03/29/17   PT Start Time 1105   PT Stop Time 1149   PT Time Calculation (min) 44 min   Activity Tolerance Patient tolerated treatment well;No increased pain   Behavior During Therapy WFL for tasks assessed/performed      Past Medical History:  Diagnosis Date  . Essential tremor   . Gout   . Hyperlipidemia   . Hypertension   . Parkinson's disease (HCC)   . Prediabetes   . Proteinuria   . Vitamin D deficiency     Past Surgical History:  Procedure Laterality Date  . CYST EXCISION     back  . KNEE SURGERY Right   . TONSILLECTOMY      There were no vitals filed for this visit.      Subjective Assessment - 03/21/17 1118    Subjective Pt voices frustration with rollator, "it keeps getting away from me".   He feels his progress has started to plateau, although wife disagrees.    Patient is accompained by: Family member  wife, French Anaracy   Pertinent History Spinal stenosis; HNP cervical and lumbar disc; bone spurs; kidney; SOB; cardiac - ventricular problem decreased ejection fraction; fx patella and scope Rt knee in remote past; Lt knee pain    Patient Stated Goals improve ability to walk    Currently in Pain? No/denies   Pain Score 0-No pain            [facility] PT Assessment - 03/21/17 0001      Assessment   Medical Diagnosis Low back pain; Gait instability    Referring Provider Dr. Lurena Joinerebecca Tat   Onset Date/Surgical Date 02/05/17   Hand Dominance Left   Next MD Visit PRN         [facility]Exercise - 03/21/17 0001       Lumbar Exercises: Standing   Row Strengthening   Theraband Level (Row) Level 3 (Green), x 15 reps    Shoulder Extension Strengthening   Theraband Level (Shoulder Extension) Level 3 (Green) x 15 reps with VC for form.    Other Standing Lumbar Exercises SEATED:  bilat shoulder ER with yellow x 10, 2 sets    Other Standing Lumbar Exercises D2 flexion with yellow x 10 reps each arm, VC for form.   semi-tandem stance with horiz head turns x 30 sec x 2 reps each leg; semi-tandem stance with vertical head turns x 20 sec - 1 LOB, recovered with UE support.   Stand to sit with slow eccentric control x 5 reps.      Knee/Hip Exercises: Stretches   Gastroc Stretch Right;Left;2 reps;60 seconds     Knee/Hip Exercises: Aerobic   Recumbent Bike L2: 6 min      Knee/Hip Exercises: Standing   Gait Training Pt given instruction of posture and tips on correct use of rollator; pt verbalized understanding and returned demo with very slow gait with rollator, 80 ft.    Other Standing Knee Exercises Standing trunk ext (to neutral) x 30 sec x 4 reps  Modalities   Modalities --  pt declined: will do at home.             PT Long Term Goals - 03/14/17 1119      PT LONG TERM GOAL #1   Title Improve posture and alignment with patient to demonstrate improved upright posture with increased extension through trunk, hips and knees in standing 03/29/17   Time 6   Period Weeks   Status On-going     PT LONG TERM GOAL #2   Title Increase strength bilat hips to 5-/5 to 5/5 throughout 03/29/17   Time 6   Period Weeks   Status On-going     PT LONG TERM GOAL #3   Title Increase hip extension bilaterally to 10 degrees to improve posture and balance 03/29/17   Time 6   Period Weeks   Status On-going     PT LONG TERM GOAL #4   Title Patient to be independent in ambulation for functional distances of 300-500 ft with appropriate assistive device 03/29/17   Time 6   Period Weeks   Status On-going     PT LONG TERM  GOAL #5   Title Independent in HEP 03/29/17   Time 6   Period Weeks   Status On-going               Plan - 03/21/17 1309    Clinical Impression Statement Pt is now requiring less cues for upright posture, including with use of rollator.  Pt tolerated increased resistance with postural strengthening exercises without increase in symptoms.  Pt is making good gains towards goals.    Rehab Potential Good   PT Frequency 2x / week   PT Duration 6 weeks   PT Treatment/Interventions Patient/family education;ADLs/Self Care Home Management;Electrical Stimulation;Iontophoresis 4mg /ml Dexamethasone;Moist Heat;Ultrasound;Dry needling;Manual techniques;Neuromuscular re-education;Therapeutic activities;Therapeutic exercise;Gait training   PT Next Visit Plan Continue progressive hip and postural strengthening; balance, gait training    Consulted and Agree with Plan of Care Patient;Family member/caregiver      Patient will benefit from skilled therapeutic intervention in order to improve the following deficits and impairments:  Postural dysfunction, Improper body mechanics, Abnormal gait, Decreased strength, Decreased mobility, Decreased range of motion, Decreased activity tolerance  Visit Diagnosis: Other abnormalities of gait and mobility  Abnormal posture  Muscle weakness (generalized)     Problem List Patient Active Problem List   Diagnosis Date Noted  . Skin ulcer of second toe, right, limited to breakdown of skin (HCC) 09/09/2016  . BMI 29.0-29.9,adult 06/11/2015  . Gastric ulcer 01/14/2015  . Morbid obesity (BMI 30+) 08/26/2014  . Sinus pause   With blocked PACs, PVCs and sinus arrhythmia 10/10/2013  . Medication management 10/06/2013  . Hyperlipidemia 08/27/2013  . Essential tremor   . Proteinuria   . Prediabetes   . Gout   . Vitamin D deficiency   . Left rotator cuff syndrome 06/11/2012  . Essential hypertension 10/10/2010   Mayer Camel, PTA 03/21/17 1:12  PM  [facility] [place] [address] [place], [place], [address] Phone: [phone]   Fax:  [phone]  Name: Leroy Griffin MRN: [id number] Date of Birth: 1945-08-20

## 2017-03-24 ENCOUNTER — Ambulatory Visit (INDEPENDENT_AMBULATORY_CARE_PROVIDER_SITE_OTHER): Payer: Medicare Other | Admitting: Physical Therapy

## 2017-03-24 DIAGNOSIS — R2689 Other abnormalities of gait and mobility: Secondary | ICD-10-CM | POA: Diagnosis present

## 2017-03-24 DIAGNOSIS — M6281 Muscle weakness (generalized): Secondary | ICD-10-CM

## 2017-03-24 DIAGNOSIS — R293 Abnormal posture: Secondary | ICD-10-CM

## 2017-03-24 NOTE — Therapy (Addendum)
[facility] [place] [address] [place], [place], [address] Phone: [phone]   Fax:  [phone]  Physical Therapy Treatment  Patient Details  Name: Leroy Griffin MRN: [id number] Date of Birth: January 18, 1946 Referring Provider: Dr. Lurena Joiner Tat  Encounter Date: 03/24/2017      PT End of Session - 03/24/17 1158    Visit Number 10   Number of Visits 12   Date for PT Re-Evaluation 03/29/17   PT Start Time 1108   PT Stop Time 1154   PT Time Calculation (min) 46 min   Activity Tolerance Patient tolerated treatment well;No increased pain   Behavior During Therapy WFL for tasks assessed/performed      Past Medical History:  Diagnosis Date  . Essential tremor   . Gout   . Hyperlipidemia   . Hypertension   . Parkinson's disease (HCC)   . Prediabetes   . Proteinuria   . Vitamin D deficiency     Past Surgical History:  Procedure Laterality Date  . CYST EXCISION     back  . KNEE SURGERY Right   . TONSILLECTOMY      There were no vitals filed for this visit.      Subjective Assessment - 03/24/17 1111    Subjective Pt reports no new changes since last visit.  Would like to focus exercise on balance today.    Patient Stated Goals improve ability to walk    Currently in Pain? Yes   Pain Score 3    Pain Location Back   Pain Orientation Lower   Pain Descriptors / Indicators Aching;Dull   Aggravating Factors  bending over, leaning forward    Pain Relieving Factors keeping back straight, medicine, TENS, heat    Pain Score 1   Pain Location Knee   Pain Orientation Right;Left   Pain Descriptors / Indicators Aching   Aggravating Factors  sitting too long, climbing stairs   Pain Relieving Factors medication.             [facility] PT Assessment - 03/24/17 0001      Assessment   Medical Diagnosis Low back pain; Gait instability    Referring Provider Dr. Lurena Joiner Tat   Onset Date/Surgical Date 02/05/17   Hand Dominance  Left   Next MD Visit PRN     Observation/Other Assessments   Focus on Therapeutic Outcomes (FOTO)  56% limited         [facility] Adult PT Treatment/Exercise - 03/24/17 0001      Lumbar Exercises: Standing   Other Standing Lumbar Exercises standing on 3" foam:  BLE narrow stance without UE support x 30 sec x 1 rep, then with perterbations from all angles x 30 sec x 2;  marching on 3" pad with light UE support x 20 sec x 2 reps ;  semi-tandem stance with reciprocal arm swing x 30 sec, then repeated with horiz head turns.    Toe taps to 3" step alternating feet with SPC in Lt hand and CGA for safety (tremulous LE) x 8 reps each leg.      Knee/Hip Exercises: Stretches   Hip Flexor Stretch Right;Left;2 reps;30 seconds  standing, in conjuction with calf stretch   Gastroc Stretch Right;Left;2 reps;60 seconds   Other Knee/Hip Stretches trunk ext, scap squeeze, knee ext with back against wall x 20-30 sec x 3 reps to improve posture.      Knee/Hip Exercises: Aerobic   Recumbent Bike L3: 6 min  Knee/Hip Exercises: Standing   Gait Training Gait training with SPC x 10 ft, x 80 ft with VC and demo of proper placement of SPC (distance away from body), VC/tactile cues for posture and step through pattern.  Speed very slow and deliberate; improved speed and quality with increased distance.                      PT Long Term Goals - 03/14/17 1119      PT LONG TERM GOAL #1   Title Improve posture and alignment with patient to demonstrate improved upright posture with increased extension through trunk, hips and knees in standing 03/29/17   Time 6   Period Weeks   Status On-going     PT LONG TERM GOAL #2   Title Increase strength bilat hips to 5-/5 to 5/5 throughout 03/29/17   Time 6   Period Weeks   Status On-going     PT LONG TERM GOAL #3   Title Increase hip extension bilaterally to 10 degrees to improve posture and balance 03/29/17   Time 6   Period Weeks   Status On-going      PT LONG TERM GOAL #4   Title Patient to be independent in ambulation for functional distances of 300-500 ft with appropriate assistive device 03/29/17   Time 6   Period Weeks   Status On-going     PT LONG TERM GOAL #5   Title Independent in HEP 03/29/17   Time 6   Period Weeks   Status On-going               Plan - 04/03/2017 1645    Clinical Impression Statement Pt scored 56% on FOTO survey, same as on intake. Although, Pt and wife feels he has improved 40-50% since initial eval. Pt is only requiring min cues for upright posture and can ambulate short distances without furniture walking and without AD.  He has made good gains towards established goals and will benefit from conitnued PT to increase safety and functional mobility    Rehab Potential Good   Clinical Impairments Affecting Rehab Potential decreased endurance and exercise tolerance due to cardiac problems; long standing decline in ambulatory status    PT Frequency 2x / week   PT Duration 6 weeks   PT Treatment/Interventions Patient/family education;ADLs/Self Care Home Management;Electrical Stimulation;Iontophoresis 4mg /ml Dexamethasone;Moist Heat;Ultrasound;Dry needling;Manual techniques;Neuromuscular re-education;Therapeutic activities;Therapeutic exercise;Gait training   PT Next Visit Plan Assess LE strength and goals.  (Last week in plan of care. )   Consulted and Agree with Plan of Care Patient;Family member/caregiver      Patient will benefit from skilled therapeutic intervention in order to improve the following deficits and impairments:  Postural dysfunction, Improper body mechanics, Abnormal gait, Decreased strength, Decreased mobility, Decreased range of motion, Decreased activity tolerance  Visit Diagnosis: Other abnormalities of gait and mobility  Abnormal posture  Muscle weakness (generalized)       [facility] PT PB G-CODES - 2017/04/03 1157    Functional Assessment Tool Used  Evaluation; FOTO; clinical  assessment , completed by Roderic Scarce PT    Functional Limitations Mobility: Walking and moving around   Mobility: Walking and Moving Around Goal Status [phone]) At least 60 percent but less than 80 percent impaired, limited or restricted   Mobility: Walking and Moving Around Discharge Status [phone]) At least 40 percent but less than 60 percent impaired, limited or restricted      Problem List Patient Active Problem List  Diagnosis Date Noted  . Skin ulcer of second toe, right, limited to breakdown of skin (HCC) 09/09/2016  . BMI 29.0-29.9,adult 06/11/2015  . Gastric ulcer 01/14/2015  . Morbid obesity (BMI 30+) 08/26/2014  . Sinus pause   With blocked PACs, PVCs and sinus arrhythmia 10/10/2013  . Medication management 10/06/2013  . Hyperlipidemia 08/27/2013  . Essential tremor   . Proteinuria   . Prediabetes   . Gout   . Vitamin D deficiency   . Left rotator cuff syndrome 06/11/2012  . Essential hypertension 10/10/2010   Mayer Camel, PTA 03/24/17 4:49 PM  [facility] [place] [address] [place], [place], [address] Phone: [phone]   Fax:  [phone]  Name: Leroy Griffin MRN: [id number] Date of Birth: 1946/04/07   PT renewal sent to MD for certification dates 03/31/17 through 04/28/17  Roderic Scarce, PT 03/31/17 11:21 AM

## 2017-03-28 ENCOUNTER — Encounter: Payer: Self-pay | Admitting: Internal Medicine

## 2017-03-28 ENCOUNTER — Ambulatory Visit (INDEPENDENT_AMBULATORY_CARE_PROVIDER_SITE_OTHER): Payer: Medicare Other | Admitting: Internal Medicine

## 2017-03-28 VITALS — BP 126/80 | HR 64 | Temp 97.3°F | Resp 18 | Ht 72.0 in | Wt 232.2 lb

## 2017-03-28 DIAGNOSIS — G250 Essential tremor: Secondary | ICD-10-CM | POA: Diagnosis not present

## 2017-03-28 DIAGNOSIS — E559 Vitamin D deficiency, unspecified: Secondary | ICD-10-CM

## 2017-03-28 DIAGNOSIS — R7303 Prediabetes: Secondary | ICD-10-CM

## 2017-03-28 DIAGNOSIS — Z125 Encounter for screening for malignant neoplasm of prostate: Secondary | ICD-10-CM | POA: Diagnosis not present

## 2017-03-28 DIAGNOSIS — E782 Mixed hyperlipidemia: Secondary | ICD-10-CM

## 2017-03-28 DIAGNOSIS — I10 Essential (primary) hypertension: Secondary | ICD-10-CM | POA: Diagnosis not present

## 2017-03-28 DIAGNOSIS — N320 Bladder-neck obstruction: Secondary | ICD-10-CM | POA: Diagnosis not present

## 2017-03-28 DIAGNOSIS — Z136 Encounter for screening for cardiovascular disorders: Secondary | ICD-10-CM | POA: Diagnosis not present

## 2017-03-28 DIAGNOSIS — Z1212 Encounter for screening for malignant neoplasm of rectum: Secondary | ICD-10-CM | POA: Diagnosis not present

## 2017-03-28 DIAGNOSIS — M1000 Idiopathic gout, unspecified site: Secondary | ICD-10-CM | POA: Diagnosis not present

## 2017-03-28 DIAGNOSIS — Z79899 Other long term (current) drug therapy: Secondary | ICD-10-CM

## 2017-03-28 NOTE — Progress Notes (Signed)
[facility]   L[place] Cowboy, M.D.      Dyanne Carrel. Steffanie Dunn, P.A.-C [facility]                [address]                [place], [place]. [address] Telephone [phone] Telefax [phone] Annual  Screening/Preventative Visit  & Comprehensive Evaluation & Examination     This very nice 71 y.o. MWM presents for a comprehensive evaluation and management of multiple medical co-morbidities.  Patient has been followed for HTN, Prediabetes, Hyperlipidemia, DUD and Vitamin D Deficiency. Patient has hx/o Essential or Hereditary tremor and has had evaluation By Tat to evaluate broad-based shuffling gait who recently did a Levo-Dopa challenge to r/o Parkinson's Dz. In April 2018 , he had a high volume LP which showed no improvement in his gait.      HTN predates circa 1988. Patient's BP has been controlled at home.  Today's BP mis at goal - 126/80. Patient had Negative screening Cardio-Pulmonary testing and Cardiac MRI in Feb 2017 per Dr Graciela Husbands which were low risk for Ischemic CAD. Patient also has Idiopathic proteinuria & CKD2 (GFR 74 ml/min)  followed by Dr Julien Girt.  Patient denies any cardiac symptoms as chest pain, palpitations, shortness of breath, dizziness or ankle swelling.     Patient's hyperlipidemia is controlled with diet and medications. Patient denies myalgias or other medication SE's. Last lipids were at goal albeit elevated Trig's: Lab Results  Component Value Date   CHOL 201 (H) 12/01/2016   HDL 56 12/01/2016   LDLCALC 90 12/01/2016   TRIG 273 (H) 12/01/2016   CHOLHDL 3.6 12/01/2016      Patient has Morbid Obesity (BMI 30+) and is screened expectantly for prediabetes and patient denies reactive hypoglycemic symptoms, visual blurring, diabetic polys or paresthesias. Last A1c was at goal: Lab Results  Component Value Date   HGBA1C 5.1 09/01/2016       Finally, patient has history of Vitamin D  Deficiency ("11" in 2009) and last vitamin D was still low: Lab Results  Component Value Date   VD25OH 46 09/01/2016   Current Outpatient Prescriptions on File Prior to Visit  Medication Sig  . acetaminophen-codeine (TYLENOL #3) 300-30 MG tablet Take 1 tablet by mouth 4 (four) times daily as needed. for CHRONIC pain  . allopurinol (ZYLOPRIM) 300 MG tablet TAKE 1/2 TO 1 TABLET BY MOUTH DAILY AS DIRECTED FOR GOUT  . atorvastatin (LIPITOR) 20 MG tablet TAKE 1/2 TO 1 TABLET BY MOUTH EVERY DAY FOR CHOLESTEROL  . Cholecalciferol (VITAMIN D) 2000 units tablet Take 10,000 Units by mouth every morning.   . diazepam (VALIUM) 5 MG tablet Take 1/2 to 1 tablet 3 x / day as needed for Tremor  . Flaxseed, Linseed, (FLAX SEED OIL) 1000 MG CAPS Take 1,000 mg by mouth every evening.   Marland Kitchen losartan (COZAAR) 100 MG tablet TAKE 1/2 TABLET BY MOUTH TWICE DAILY  . MAGNESIUM PO Take 50 mg by mouth 2 (two) times daily.  . Minoxidil (ROGAINE MENS) 5 % FOAM Apply 1 application topically daily.  . Omega-3 Fatty Acids (FISH OIL) 1200 MG CPDR Take 2 capsules by mouth at bedtime.   . triamcinolone ointment (KENALOG) 0.1 % Apply 1 application topically 3 (three) times daily. (Patient taking differently: Apply 1 application topically 2 (two) times daily as needed. )  . vitamin B-12 (CYANOCOBALAMIN) 1000 MCG tablet Take 1,000 mcg  by mouth daily.  Marland Kitchen zinc gluconate 50 MG tablet Take 50 mg by mouth daily.   No current facility-administered medications on file prior to visit.    Allergies  Allergen Reactions  . Enalapril Rash  . Aspirin     Internal bleeding  . Nsaids     Internal bleeding  . Welchol [Colesevelam Hcl] Diarrhea  . Vasotec [Enalaprilat] Rash   Past Medical History:  Diagnosis Date  . Essential tremor   . Gout   . Hyperlipidemia   . Hypertension   . Parkinson's disease (HCC)   . Prediabetes   . Proteinuria   . Vitamin D deficiency    Health Maintenance  Topic Date Due  . Hepatitis C Screening   [birth year]  . COLONOSCOPY  03/09/1994  . INFLUENZA VACCINE  03/08/2017  . TETANUS/TDAP  01/29/2024  . PNA vac Low Risk Adult  Completed   Immunization History  Administered Date(s) Administered  . Influenza Split 05/23/2013  . Influenza, High Dose Seasonal PF 05/14/2014, 06/11/2015, 05/19/2016  . Pneumococcal Conjugate-13 12/03/2014  . Pneumococcal Polysaccharide-23 06/01/2010  . Td 06/01/2010  . Tdap 01/28/2014  . Zoster 08/08/2004   Past Surgical History:  Procedure Laterality Date  . CYST EXCISION     back  . KNEE SURGERY Right   . TONSILLECTOMY     Family History  Problem Relation Age of Onset  . Dementia Mother   . Stroke Father   . Emphysema Brother   . Heart disease Brother    Social History   Social History  . Marital status: Married    Spouse name: N/A  . Number of children: N/A  . Years of education: N/A   Occupational History  . Retired Patent examiner [employer]    Social History Main Topics  . Smoking status: Never Smoker  . Smokeless tobacco: Never Used  . Alcohol use 7.0 oz/week    14 Standard drinks or equivalent per week     Comment: 2 drinks a night (gin and tonic)  . Drug use: No  . Sexual activity: Not on file    ROS Constitutional: Denies fever, chills, weight loss/gain, headaches, insomnia,  night sweats or change in appetite. Does c/o fatigue. Eyes: Denies redness, blurred vision, diplopia, discharge, itchy or watery eyes.  ENT: Denies discharge, congestion, post nasal drip, epistaxis, sore throat, earache, hearing loss, dental pain, Tinnitus, Vertigo, Sinus pain or snoring.  Cardio: Denies chest pain, palpitations, irregular heartbeat, syncope, dyspnea, diaphoresis, orthopnea, PND, claudication or edema Respiratory: denies cough, dyspnea, DOE, pleurisy, hoarseness, laryngitis or wheezing.  Gastrointestinal: Denies dysphagia, heartburn, reflux, water brash, pain, cramps, nausea, vomiting, bloating, diarrhea, constipation, hematemesis,  melena, hematochezia, jaundice or hemorrhoids Genitourinary: Denies dysuria, frequency, urgency, nocturia, hesitancy, discharge, hematuria or flank pain Musculoskeletal: Denies arthralgia, myalgia, stiffness, Jt. Swelling, pain, limp or strain/sprain. Denies Falls. Skin: Denies puritis, rash, hives, warts, acne, eczema or change in skin lesion Neuro: No weakness, tremor, incoordination, spasms, paresthesia or pain Psychiatric: Denies confusion, memory loss or sensory loss. Denies Depression. Endocrine: Denies change in weight, skin, hair change, nocturia, and paresthesia, diabetic polys, visual blurring or hyper / hypo glycemic episodes.  Heme/Lymph: No excessive bleeding, bruising or enlarged lymph nodes.  Physical Exam  BP 126/80   Pulse 64   Temp (!) 97.3 F (36.3 C)   Resp 18   Ht 6' (1.829 m)   Wt 232 lb 3.2 oz (105.3 kg)   BMI 31.49 kg/m   General Appearance: Over nourished and well  groomed and in no apparent distress.  Eyes: PERRLA, EOMs, conjunctiva no swelling or erythema, normal fundi and vessels. Sinuses: No frontal/maxillary tenderness ENT/Mouth: EACs patent / TMs  nl. Nares clear without erythema, swelling, mucoid exudates. Oral hygiene is good. No erythema, swelling, or exudate. Tongue normal, non-obstructing. Tonsils not swollen or erythematous. Hearing normal.  Neck: Supple, thyroid normal. No bruits, nodes or JVD. Respiratory: Respiratory effort normal.  BS equal and clear bilateral without rales, rhonci, wheezing or stridor. Cardio: Heart sounds are normal with regular rate and rhythm and no murmurs, rubs or gallops. Peripheral pulses are normal and equal bilaterally without edema. No aortic or femoral bruits. Chest: symmetric with normal excursions and percussion.  Abdomen: Soft, with Nl bowel sounds. Nontender, no guarding, rebound, hernias, masses, or organomegaly.  Lymphatics: Non tender without lymphadenopathy.  Genitourinary: No hernias.Testes nl. DRE -  prostate nl for age - smooth & firm w/o nodules. Musculoskeletal: Full ROM all peripheral extremities, joint stability, 5/5 strength, and broad based sl shuffling  Gait supported by a cane. Skin: Warm and dry without rashes, lesions, cyanosis, clubbing or  ecchymosis.  Neuro: Cranial nerves intact, reflexes equal bilaterally. Normal muscle tone, no cerebellar symptoms. Sensation intact.  Pysch: Alert and oriented X 3 with normal affect, insight and judgment appropriate.   Assessment and Plan  1. Essential hypertension  - [place], RETROPERITNL ABD,  LTD - Urinalysis, Routine w reflex microscopic - Microalbumin / creatinine urine ratio - CBC with Differential/Platelet - BASIC METABOLIC PANEL WITH GFR - Magnesium - TSH  2. Hyperlipidemia, mixed  - [place], RETROPERITNL ABD,  LTD - Hepatic function panel - Lipid panel - TSH  3. Prediabetes  - [place], RETROPERITNL ABD,  LTD - Hemoglobin A1c - Insulin, fasting  4. Vitamin D deficiency  - VITAMIN D 25 Hydroxy   5. Idiopathic gout - Uric acid  6. Essential tremor   7. Screening for rectal cancer  - POC Hemoccult Bld/Stl   8. Prostate cancer screening  - PSA  9. Bladder neck obstruction  - PSA  10. Screening for ischemic heart disease   11. Screening for AAA (aortic abdominal aneurysm)  - [place], RETROPERITNL ABD,  LTD  12. Medication management  - Urinalysis, Routine w reflex microscopic - Microalbumin / creatinine urine ratio - Uric acid - CBC with Differential/Platelet - BASIC METABOLIC PANEL WITH GFR - Hepatic function panel - Magnesium - Lipid panel - TSH - Hemoglobin A1c - Insulin, fasting - VITAMIN D 25 Hydroxy       Patient was counseled in prudent diet, weight control to achieve/maintain BMI less than 25, BP monitoring, regular exercise and medications as discussed.  Discussed med effects and SE's. Routine screening labs and tests as requested with regular follow-up as recommended. Over 40 minutes of exam,  counseling, chart review and high complex critical decision making was performed

## 2017-03-28 NOTE — Patient Instructions (Signed)

## 2017-03-29 ENCOUNTER — Ambulatory Visit (INDEPENDENT_AMBULATORY_CARE_PROVIDER_SITE_OTHER): Payer: Medicare Other | Admitting: Physical Therapy

## 2017-03-29 DIAGNOSIS — R2689 Other abnormalities of gait and mobility: Secondary | ICD-10-CM

## 2017-03-29 DIAGNOSIS — M6281 Muscle weakness (generalized): Secondary | ICD-10-CM | POA: Diagnosis not present

## 2017-03-29 DIAGNOSIS — R293 Abnormal posture: Secondary | ICD-10-CM | POA: Diagnosis not present

## 2017-03-29 LAB — VITAMIN D 25 HYDROXY (VIT D DEFICIENCY, FRACTURES): Vit D, 25-Hydroxy: 53 ng/mL (ref 30–100)

## 2017-03-29 LAB — CBC WITH DIFFERENTIAL/PLATELET
Basophils Absolute: 22 cells/uL (ref 0–200)
Basophils Relative: 0.4 %
Eosinophils Absolute: 140 cells/uL (ref 15–500)
Eosinophils Relative: 2.5 %
HCT: 48.5 % (ref 38.5–50.0)
Hemoglobin: 16.5 g/dL (ref 13.2–17.1)
Lymphs Abs: 1893 cells/uL (ref 850–3900)
MCH: 32.4 pg (ref 27.0–33.0)
MCHC: 34 g/dL (ref 32.0–36.0)
MCV: 95.1 fL (ref 80.0–100.0)
MPV: 9.6 fL (ref 7.5–12.5)
Monocytes Relative: 9.7 %
Neutro Abs: 3002 cells/uL (ref 1500–7800)
Neutrophils Relative %: 53.6 %
Platelets: 142 10*3/uL (ref 140–400)
RBC: 5.1 10*6/uL (ref 4.20–5.80)
RDW: 13.3 % (ref 11.0–15.0)
Total Lymphocyte: 33.8 %
WBC mixed population: 543 cells/uL (ref 200–950)
WBC: 5.6 10*3/uL (ref 3.8–10.8)

## 2017-03-29 LAB — BASIC METABOLIC PANEL WITH GFR
BUN: 20 mg/dL (ref 7–25)
CO2: 26 mmol/L (ref 20–32)
Calcium: 10 mg/dL (ref 8.6–10.3)
Chloride: 102 mmol/L (ref 98–110)
Creat: 1.15 mg/dL (ref 0.70–1.18)
GFR, Est African American: 73 mL/min/{1.73_m2} (ref >=60)
GFR, Est Non African American: 63 mL/min/{1.73_m2} (ref >=60)
Glucose, Bld: 81 mg/dL (ref 65–99)
Potassium: 4.6 mmol/L (ref 3.5–5.3)
Sodium: 140 mmol/L (ref 135–146)

## 2017-03-29 LAB — HEPATIC FUNCTION PANEL
AG Ratio: 1.6 (calc) (ref 1.0–2.5)
ALT: 15 U/L (ref 9–46)
AST: 21 U/L (ref 10–35)
Albumin: 4.1 g/dL (ref 3.6–5.1)
Alkaline phosphatase (APISO): 57 U/L (ref 40–115)
Bilirubin, Direct: 0.1 mg/dL (ref 0.0–0.2)
Globulin: 2.5 g/dL (calc) (ref 1.9–3.7)
Indirect Bilirubin: 0.8 mg/dL (calc) (ref 0.2–1.2)
Total Bilirubin: 0.9 mg/dL (ref 0.2–1.2)
Total Protein: 6.6 g/dL (ref 6.1–8.1)

## 2017-03-29 LAB — HEMOGLOBIN A1C
Hgb A1c MFr Bld: 5.2 % of total Hgb (ref <5.7)
Mean Plasma Glucose: 103 (calc)
eAG (mmol/L): 5.7 (calc)

## 2017-03-29 LAB — LIPID PANEL
Cholesterol: 199 mg/dL (ref <200)
HDL: 53 mg/dL (ref >40)
LDL Cholesterol (Calc): 97 mg/dL (calc)
Non-HDL Cholesterol (Calc): 146 mg/dL (calc) — ABNORMAL HIGH (ref <130)
Total CHOL/HDL Ratio: 3.8 (calc) (ref <5.0)
Triglycerides: 369 mg/dL — ABNORMAL HIGH (ref <150)

## 2017-03-29 LAB — INSULIN, FASTING: Insulin: 7.5 u[IU]/mL (ref 2.0–19.6)

## 2017-03-29 LAB — URIC ACID: Uric Acid, Serum: 4.1 mg/dL (ref 4.0–8.0)

## 2017-03-29 LAB — PSA: PSA: 0.8 ng/mL (ref <=4.0)

## 2017-03-29 LAB — TSH: TSH: 2 mIU/L (ref 0.40–4.50)

## 2017-03-29 LAB — MAGNESIUM: Magnesium: 1.9 mg/dL (ref 1.5–2.5)

## 2017-03-29 NOTE — Therapy (Signed)
[facility] [facility] [place], [place], [address] Phone: [phone]   Fax:  [phone]  Physical Therapy Treatment  Patient Details  Name: Leroy Griffin MRN: [id number] Date of Birth: 02-27-1946 Referring Provider: Dr. Wells Guiles Tat  Encounter Date: 03/29/2017      PT End of Session - 03/29/17 1029    Visit Number 11   Number of Visits 12   Date for PT Re-Evaluation 03/29/17   PT Start Time 1022  pt arrived late   PT Stop Time 1108   PT Time Calculation (min) 46 min   Activity Tolerance Patient tolerated treatment well;No increased pain   Behavior During Therapy WFL for tasks assessed/performed      Past Medical History:  Diagnosis Date  . Essential tremor   . Gout   . Hyperlipidemia   . Hypertension   . Parkinson's disease ([place])   . Prediabetes   . Proteinuria   . Vitamin D deficiency     Past Surgical History:  Procedure Laterality Date  . CYST EXCISION     back  . KNEE SURGERY Right   . TONSILLECTOMY      There were no vitals filed for this visit.      Subjective Assessment - 03/29/17 1030    Subjective Pt had physical yesterday.  He said his doctor notice improved gait.  His back is sore from installing a new propane tank.    Pertinent History Spinal stenosis; HNP cervical and lumbar disc; bone spurs; kidney; SOB; cardiac - ventricular problem decreased ejection fraction; fx patella and scope Rt knee in remote past; Lt knee pain    Patient Stated Goals improve ability to walk    Currently in Pain? Yes   Pain Score 6    Pain Location Back   Pain Orientation Lower;Right;Left   Aggravating Factors  bending over, leaning forward    Pain Relieving Factors keeping back straight, medicine, TENS            [facility] PT Assessment - 03/29/17 0001      Assessment   Medical Diagnosis Low back pain; Gait instability    Referring Provider Dr. Wells Guiles Tat   Onset Date/Surgical Date 02/05/17   Hand  Dominance Left   Next MD Visit PRN     AROM   Right Hip Extension 5  in Rt sidelying with pelvis stabilized.    Left Hip Extension 10  in Rt sidelying with pelvis stabilized.      Strength   Right Hip Flexion 5/5   Right Hip Extension --  5-/5   Right Hip ABduction --  5-/5   Left Hip Extension 4+/5   Left Hip ABduction --  5-/5     Flexibility   Quadriceps RLE 125 deg; LLE 132                     [facility] Adult PT Treatment/Exercise - 03/29/17 0001      Lumbar Exercises: Supine   Bridge 5 reps;5 seconds     Knee/Hip Exercises: Stretches   Sports administrator Right;Left;2 reps;30 seconds   Hip Flexor Stretch Right;Left;2 reps;30 seconds  standing, in conjuction with calf stretch   Gastroc Stretch Right;Left;2 reps;60 seconds   Other Knee/Hip Stretches Trunk ext to neutral with light UE support on counter x 5 sec x 5 reps     Knee/Hip Exercises: Aerobic   Recumbent Bike L3: 7 min      Knee/Hip Exercises:  Standing   Gait Training Gait training without AD with close SBA: tactile cues for posture, VC for increased step height (to avoid shuffling) x 10 ft, x 45 ft, x 40 ft. Improved gait quality and cadence with cues and repetition.     Other Standing Knee Exercises side stepping with increased step height with light UE support at counter x10 ft Rt/Lt x 2 reps.    Other Standing Knee Exercises wide base of support with feet, weight shifts Rt/Lt with opp arm reach to item on counter just outside of base of support x 4 reps x 3 reps.       Knee/Hip Exercises: Sidelying   Other Sidelying Knee/Hip Exercises Rt/Lt hip active hip ext x 3 reps each leg (back supported by PTA)     Modalities   Modalities --  declined; will do at home.                      PT Long Term Goals - 03/29/17 1035      PT LONG TERM GOAL #1   Title Improve posture and alignment with patient to demonstrate improved upright posture with increased extension through trunk, hips and knees  in standing 03/29/17   Time 6   Period Weeks   Status On-going     PT LONG TERM GOAL #2   Title Increase strength bilat hips to 5-/5 to 5/5 throughout 03/29/17   Time 6   Period Weeks   Status Partially Met     PT LONG TERM GOAL #3   Title Increase hip extension bilaterally to 10 degrees to improve posture and balance 03/29/17   Time 6   Period Weeks   Status Partially Met     PT LONG TERM GOAL #4   Title Patient to be independent in ambulation for functional distances of 300-500 ft with appropriate assistive device 03/29/17   Time 6   Period Weeks   Status Achieved     PT LONG TERM GOAL #5   Title Independent in HEP 03/29/17   Time 6   Period Weeks   Status On-going     PT LONG TERM GOAL #6   Title Improve FOTO to </= 42% limitation 03/29/17   Time 6   Period Weeks   Status On-going               Plan - 03/29/17 1326    Clinical Impression Statement Pt demonstrated improved hip strength; partially met LTG #3.  Rt quad flexibility improved as well as hip flexors bilat.  Pt continues with dynamic standing balance deficits, but has made good improvements in gait quality observed in clinic.  Progressing towards goals.    Rehab Potential Good   Clinical Impairments Affecting Rehab Potential decreased endurance and exercise tolerance due to cardiac problems; long standing decline in ambulatory status    PT Frequency 2x / week   PT Duration 6 weeks   PT Treatment/Interventions Patient/family education;ADLs/Self Care Home Management;Electrical Stimulation;Iontophoresis 38m/ml Dexamethasone;Moist Heat;Ultrasound;Dry needling;Manual techniques;Neuromuscular re-education;Therapeutic activities;Therapeutic exercise;Gait training   PT Next Visit Plan Assess readiness to d/c vs continuation of therapy.     Consulted and Agree with Plan of Care Patient      Patient will benefit from skilled therapeutic intervention in order to improve the following deficits and impairments:   Postural dysfunction, Improper body mechanics, Abnormal gait, Decreased strength, Decreased mobility, Decreased range of motion, Decreased activity tolerance  Visit Diagnosis: Other abnormalities of gait and  mobility  Abnormal posture  Muscle weakness (generalized)     Problem List Patient Active Problem List   Diagnosis Date Noted  . Skin ulcer of second toe, right, limited to breakdown of skin ([place]) 09/09/2016  . BMI 29.0-29.9,adult 06/11/2015  . Gastric ulcer 01/14/2015  . Morbid obesity (BMI 30+) 08/26/2014  . Sinus pause   With blocked PACs, PVCs and sinus arrhythmia 10/10/2013  . Medication management 10/06/2013  . Hyperlipidemia 08/27/2013  . Essential tremor   . Proteinuria   . Prediabetes   . Gout   . Vitamin D deficiency   . Left rotator cuff syndrome 06/11/2012  . Essential hypertension 10/10/2010    Shelbie Hutching 03/29/2017, 1:30 PM  [facility] [place], [place], [address] Phone: [phone]   Fax:  [phone]  Name: Leroy Griffin MRN: [id number] Date of Birth: [birth year]

## 2017-03-31 ENCOUNTER — Ambulatory Visit (INDEPENDENT_AMBULATORY_CARE_PROVIDER_SITE_OTHER): Payer: Medicare Other | Admitting: Physical Therapy

## 2017-03-31 DIAGNOSIS — R293 Abnormal posture: Secondary | ICD-10-CM | POA: Diagnosis not present

## 2017-03-31 DIAGNOSIS — R2689 Other abnormalities of gait and mobility: Secondary | ICD-10-CM

## 2017-03-31 DIAGNOSIS — M6281 Muscle weakness (generalized): Secondary | ICD-10-CM

## 2017-03-31 NOTE — Therapy (Addendum)
[place], [place], [address] Phone: [phone]   Fax:  [phone]  Physical Therapy Treatment  Patient Details  Name: Leroy Griffin MRN: [id number] Date of Birth: 09-12-1945 Referring Provider: Dr. Wells Guiles Tat  Encounter Date: 03/31/2017      PT End of Session - 03/31/17 1557    Visit Number 12   Number of Visits 20   Date for PT Re-Evaluation 04/28/17   PT Start Time 2563   PT Stop Time 1446   PT Time Calculation (min) 41 min   Activity Tolerance Patient tolerated treatment well   Behavior During Therapy [facility] for tasks assessed/performed      Past Medical History:  Diagnosis Date  . Essential tremor   . Gout   . Hyperlipidemia   . Hypertension   . Parkinson's disease ([place])   . Prediabetes   . Proteinuria   . Vitamin D deficiency     Past Surgical History:  Procedure Laterality Date  . CYST EXCISION     back  . KNEE SURGERY Right   . TONSILLECTOMY      There were no vitals filed for this visit.      Subjective Assessment - 03/31/17 1411    Subjective Pt is sore in calves and quads from stretching yesterday. He did some side stepping at counter at home and it went well.  Pt interested in continuation of therapy to improve gait, balance, and strength.    Pertinent History Spinal stenosis; HNP cervical and lumbar disc; bone spurs; kidney; SOB; cardiac - ventricular problem decreased ejection fraction; fx patella and scope Rt knee in remote past; Lt knee pain    Currently in Pain? Yes   Pain Score 3    Pain Location Leg   Pain Orientation Right;Left   Pain Descriptors / Indicators Sore   Aggravating Factors  over stretching    Pain Relieving Factors heat, rest.             [facility] PT Assessment - 03/31/17 0001      Assessment   Medical Diagnosis Low back pain; Gait instability    Referring Provider Dr. Wells Guiles Tat   Onset Date/Surgical Date 02/05/17   Hand Dominance  Left   Next MD Visit PRN   Prior Therapy none for back/balance          [facility] Adult PT Treatment/Exercise - 03/31/17 0001      Neck Exercises: Prone   Axial Exentsion 5 reps  with gentle shoulder ext      Lumbar Exercises: Stretches   Prone on Elbows Stretch 2 reps;20 seconds     Lumbar Exercises: Prone   Opposite Arm/Leg Raise Right arm/Left leg;Left arm/Right leg;5 reps     Knee/Hip Exercises: Stretches   Sports administrator Right;Left;2 reps;30 seconds   Hip Flexor Stretch Right;Left;2 reps;30 seconds  standing, in conjuction with calf stretch   Gastroc Stretch Right;Left;2 reps;60 seconds   Other Knee/Hip Stretches Trunk ext to neutral with light UE support on counter x 5 sec x 2 reps     Knee/Hip Exercises: Aerobic   Recumbent Bike L3: 5 min      Knee/Hip Exercises: Standing   Other Standing Knee Exercises side stepping with increased step height with light UE support at counter x10 ft Rt/Lt; then side stepping over 1/2 foam roller x 5 reps each direction (light UE support on counter); forward stepping over 1/2 foam roller with one foot and returning to  starting position x 8 reps each leg.    Other Standing Knee Exercises trunk ext with over head reach with arms sliding up cabinets x 10 reps      Knee/Hip Exercises: Seated   Other Seated Knee/Hip Exercises thoracic ext over back of chair x 5 reps    Sit to Sand 5 reps;without UE support                PT Education - 03/31/17 1615    Education provided Yes   Education Details HEP - trunk ext with arms sliding up cabinet, side stepping at counter, thoracic ext over chair.    Person(s) Educated Patient;Spouse   Methods Explanation;Demonstration   Comprehension Returned demonstration;Verbalized understanding             PT Long Term Goals - 03/31/17 1415      PT LONG TERM GOAL #1   Title Improve posture and alignment with patient to demonstrate improved upright posture with increased extension through trunk,  hips and knees in standing 03/29/17   Time 10   Period Weeks   Status On-going     PT LONG TERM GOAL #2   Title Increase strength bilat hips to 5-/5 to 5/5 throughout 03/29/17   Time 10   Period Weeks   Status Partially Met     PT LONG TERM GOAL #3   Title Increase hip extension bilaterally to 10 degrees to improve posture and balance 03/29/17   Time 10   Period Weeks   Status Partially Met     PT LONG TERM GOAL #4   Title Patient to be independent in ambulation for functional distances of 300-500 ft with appropriate assistive device 03/29/17   Time 6   Period Weeks   Status Achieved     PT LONG TERM GOAL #5   Title Independent in HEP 03/29/17   Time 10   Period Weeks   Status On-going     Additional Long Term Goals   Additional Long Term Goals Yes     PT LONG TERM GOAL #6   Title Improve FOTO to </= 42% limitation 03/29/17   Time 10   Period Weeks   Status On-going     PT LONG TERM GOAL #7   Title Improve BERG =/>53/56 to improve safety with ambulation   Time 10   Period Weeks   Status New     PT LONG TERM GOAL #8   Title Ambulate with straight cane short distances in community with good upright posture   Time 10   Period Weeks   Status New               Plan - 03/31/17 1600    Clinical Impression Statement Pt struggled with prone ext exercises; limited this to 5 reps to assess low back tolerance. Pt reported some increased discomfort after performing these exercises; reduced with position change.  Pt continues with standing balance deficits; will benefit from continued PT intervention to maximize functional mobility and safety.    Rehab Potential Good   Clinical Impairments Affecting Rehab Potential decreased endurance and exercise tolerance due to cardiac problems; long standing decline in ambulatory status    PT Frequency 2x / week   PT Duration 6 weeks   PT Treatment/Interventions Patient/family education;ADLs/Self Care Home Management;Electrical  Stimulation;Iontophoresis 69m/ml Dexamethasone;Moist Heat;Ultrasound;Dry needling;Manual techniques;Neuromuscular re-education;Therapeutic activities;Therapeutic exercise;Gait training   PT Next Visit Plan Spoke to supervising PT regarding progress.  Will send request for additional visits  and continue strengthening and balance exercise.    Consulted and Agree with Plan of Care Patient      Patient will benefit from skilled therapeutic intervention in order to improve the following deficits and impairments:  Postural dysfunction, Improper body mechanics, Abnormal gait, Decreased strength, Decreased mobility, Decreased range of motion, Decreased activity tolerance  Visit Diagnosis: Other abnormalities of gait and mobility  Abnormal posture  Muscle weakness (generalized)     Problem List Patient Active Problem List   Diagnosis Date Noted  . Skin ulcer of second toe, right, limited to breakdown of skin ([place]) 09/09/2016  . BMI 29.0-29.9,adult 06/11/2015  . Gastric ulcer 01/14/2015  . Morbid obesity (BMI 30+) 08/26/2014  . Sinus pause   With blocked PACs, PVCs and sinus arrhythmia 10/10/2013  . Medication management 10/06/2013  . Hyperlipidemia 08/27/2013  . Essential tremor   . Proteinuria   . Prediabetes   . Gout   . Vitamin D deficiency   . Left rotator cuff syndrome 06/11/2012  . Essential hypertension 10/10/2010   Kerin Perna, PTA 03/31/17 4:16 PM  [place], [place], [address] Phone: [phone]   Fax:  [phone]  Name: Leroy Griffin MRN: [id number] Date of Birth: 10-18-1945   Jeral Pinch, PT 04/03/17 11:01 AM

## 2017-03-31 NOTE — Addendum Note (Signed)
Addended by: Jairen Goldfarb E on: 03/31/2017 11:21 AM   Modules accepted: Orders

## 2017-04-04 ENCOUNTER — Ambulatory Visit (INDEPENDENT_AMBULATORY_CARE_PROVIDER_SITE_OTHER): Payer: Medicare Other | Admitting: Physical Therapy

## 2017-04-04 DIAGNOSIS — R293 Abnormal posture: Secondary | ICD-10-CM

## 2017-04-04 DIAGNOSIS — R2689 Other abnormalities of gait and mobility: Secondary | ICD-10-CM

## 2017-04-04 DIAGNOSIS — M6281 Muscle weakness (generalized): Secondary | ICD-10-CM

## 2017-04-04 NOTE — Therapy (Signed)
[place], [place], [address] Phone: [phone]   Fax:  [phone]  Physical Therapy Treatment  Patient Details  Name: Leroy Griffin MRN: [id number] Date of Birth: 01-21-1946 Referring Provider: Dr. Wells Guiles Tat  Encounter Date: 04/04/2017      PT End of Session - 04/04/17 1156    Visit Number 13   Number of Visits 20   Date for PT Re-Evaluation 04/28/17   PT Start Time 1937   PT Stop Time 1232   PT Time Calculation (min) 44 min   Activity Tolerance Patient tolerated treatment well   Behavior During Therapy [facility] for tasks assessed/performed      Past Medical History:  Diagnosis Date  . Essential tremor   . Gout   . Hyperlipidemia   . Hypertension   . Parkinson's disease ([place])   . Prediabetes   . Proteinuria   . Vitamin D deficiency     Past Surgical History:  Procedure Laterality Date  . CYST EXCISION     back  . KNEE SURGERY Right   . TONSILLECTOMY      There were no vitals filed for this visit.      Subjective Assessment - 04/04/17 1157    Subjective Pt arrives with paper in hand; would like me to check his HEP to make sure it is right (he has created a check off sheet).   Pt has a kink in neck for sleeping wrong, but otherwise feeling well.  He mentions that some days he exercises 2-3x during day.    Patient is accompained by: Family member  wife, Leroy Griffin   Currently in Pain? No/denies   Pain Score 0-No pain            [facility] PT Assessment - 04/04/17 0001      Assessment   Medical Diagnosis Low back pain; Gait instability    Referring Provider Dr. Wells Guiles Tat   Onset Date/Surgical Date 02/05/17   Hand Dominance Left   Next MD Visit PRN          [facility] Adult PT Treatment/Exercise - 04/04/17 0001      Knee/Hip Exercises: Stretches   Hip Flexor Stretch Right;Left;2 reps;30 seconds  standing, in conjuction with calf stretch   Gastroc Stretch Right;Left;2 reps;60  seconds   Other Knee/Hip Stretches Trunk ext with back against wall, scap squeeze x 30 sec x 2 reps     Knee/Hip Exercises: Aerobic   Recumbent Bike L3: 7 min      Knee/Hip Exercises: Standing   Other Standing Knee Exercises Rt/Lt; then side stepping with light UE support on counter x 5 ft each way x 2 reps - tc for posture; forward stepping over 1/2 foam roller with one foot and returning to starting position x 8 reps each leg;   Tandem walk 8 ft x 4 reps with moderate support on counter;  bilat rowing with red band x 12 reps   Other Standing Knee Exercises trunk ext with over head reach with arms sliding up cabinets x 10 reps; Unilateral arm reach with opp leg ext x 5 reps each side.       Knee/Hip Exercises: Seated   Other Seated Knee/Hip Exercises thoracic ext over back of chair x 5 reps, bilat shoulder ER with red band x 12 reps    Sit to Sand 5 reps;without UE support  PT Long Term Goals - 03/31/17 1415      PT LONG TERM GOAL #1   Title Improve posture and alignment with patient to demonstrate improved upright posture with increased extension through trunk, hips and knees in standing 03/29/17   Time 10   Period Weeks   Status On-going     PT LONG TERM GOAL #2   Title Increase strength bilat hips to 5-/5 to 5/5 throughout 03/29/17   Time 10   Period Weeks   Status Partially Met     PT LONG TERM GOAL #3   Title Increase hip extension bilaterally to 10 degrees to improve posture and balance 03/29/17   Time 10   Period Weeks   Status Partially Met     PT LONG TERM GOAL #4   Title Patient to be independent in ambulation for functional distances of 300-500 ft with appropriate assistive device 03/29/17   Time 6   Period Weeks   Status Achieved     PT LONG TERM GOAL #5   Title Independent in HEP 03/29/17   Time 10   Period Weeks   Status On-going     Additional Long Term Goals   Additional Long Term Goals Yes     PT LONG TERM GOAL #6    Title Improve FOTO to </= 42% limitation 03/29/17   Time 10   Period Weeks   Status On-going     PT LONG TERM GOAL #7   Title Improve BERG =/>53/56 to improve safety with ambulation   Time 10   Period Weeks   Status New     PT LONG TERM GOAL #8   Title Ambulate with straight cane short distances in community with good upright posture   Time 10   Period Weeks   Status New               Plan - 04/04/17 1235    Clinical Impression Statement Pt tolerated standing ext exercises well, without increase in low back pain.  He requires frequent tactile cues to correct forward trunk posture; fatigues quickly once in this upright posture requiring frequent brief seated rest breaks.   Pt progressing well towards goals.    Rehab Potential Good   Clinical Impairments Affecting Rehab Potential decreased endurance and exercise tolerance due to cardiac problems; long standing decline in ambulatory status    PT Frequency 2x / week   PT Duration 6 weeks   PT Treatment/Interventions Patient/family education;ADLs/Self Care Home Management;Electrical Stimulation;Iontophoresis 75m/ml Dexamethasone;Moist Heat;Ultrasound;Dry needling;Manual techniques;Neuromuscular re-education;Therapeutic activities;Therapeutic exercise;Gait training   PT Next Visit Plan Continue ext bases strengthening and balance exercises.    Consulted and Agree with Plan of Care Patient      Patient will benefit from skilled therapeutic intervention in order to improve the following deficits and impairments:  Postural dysfunction, Improper body mechanics, Abnormal gait, Decreased strength, Decreased mobility, Decreased range of motion, Decreased activity tolerance  Visit Diagnosis: Other abnormalities of gait and mobility  Abnormal posture  Muscle weakness (generalized)     Problem List Patient Active Problem List   Diagnosis Date Noted  . Skin ulcer of second toe, right, limited to breakdown of skin ([place] 09/09/2016   . BMI 29.0-29.9,adult 06/11/2015  . Gastric ulcer 01/14/2015  . Morbid obesity (BMI 30+) 08/26/2014  . Sinus pause   With blocked PACs, PVCs and sinus arrhythmia 10/10/2013  . Medication management 10/06/2013  . Hyperlipidemia 08/27/2013  . Essential tremor   . Proteinuria   .  Prediabetes   . Gout   . Vitamin D deficiency   . Left rotator cuff syndrome 06/11/2012  . Essential hypertension 10/10/2010   Kerin Perna, PTA 04/04/17 12:46 PM  [place], [place], [address] Phone: [phone]   Fax:  [phone]  Name: ISACC TURNEY MRN: [id number] Date of Birth: January 09, 1946

## 2017-04-07 ENCOUNTER — Ambulatory Visit (INDEPENDENT_AMBULATORY_CARE_PROVIDER_SITE_OTHER): Payer: Medicare Other | Admitting: Physical Therapy

## 2017-04-07 DIAGNOSIS — R293 Abnormal posture: Secondary | ICD-10-CM | POA: Diagnosis present

## 2017-04-07 DIAGNOSIS — M6281 Muscle weakness (generalized): Secondary | ICD-10-CM | POA: Diagnosis not present

## 2017-04-07 DIAGNOSIS — R2689 Other abnormalities of gait and mobility: Secondary | ICD-10-CM | POA: Diagnosis not present

## 2017-04-07 NOTE — Therapy (Signed)
[place], [place], [address] Phone: [phone]   Fax:  [phone]  Physical Therapy Treatment  Patient Details  Name: Leroy Griffin MRN: [id number] Date of Birth: 1946/05/31 Referring Provider: Dr. Wells Guiles Tat  Encounter Date: 04/07/2017      PT End of Session - 04/07/17 1306    Visit Number 14   Number of Visits 20   Date for PT Re-Evaluation 04/28/17   PT Start Time 1151  pt arrived late   PT Stop Time 1230   PT Time Calculation (min) 39 min   Activity Tolerance Patient tolerated treatment well   Behavior During Therapy [facility] for tasks assessed/performed      Past Medical History:  Diagnosis Date  . Essential tremor   . Gout   . Hyperlipidemia   . Hypertension   . Parkinson's disease ([place])   . Prediabetes   . Proteinuria   . Vitamin D deficiency     Past Surgical History:  Procedure Laterality Date  . CYST EXCISION     back  . KNEE SURGERY Right   . TONSILLECTOMY      There were no vitals filed for this visit.      Subjective Assessment - 04/07/17 1239    Subjective Pt states he has nothing new to reports since last visit.  He ambulates into therapy with rollator.    Patient is accompained by: Family member  wife, [name]   Currently in Pain? No/denies   Pain Score 0-No pain            [facility] PT Assessment - 04/07/17 0001      Assessment   Medical Diagnosis Low back pain; Gait instability            [facility] Adult PT Treatment/Exercise - 04/07/17 0001      Lumbar Exercises: Standing   Other Standing Lumbar Exercises side stepping with increased step height x 3 steps Lt/ Rt  with then  wide stance, weight shift with overhead reach cross body x 10 total reps (rest break after 5 reps);  toe taps to 6" step with light UE support on counter x 10 reps each leg.   Standing against wall: pressing back of arms and head against wall x 5 sec hold x 8 reps, then snow angels to  90 deg abd x 8 reps     Other Standing Lumbar Exercises tandem stance without UE support x 30 sec each leg forward; semi-tandem stance with horiz head turns x 20 sec each, repeated with vertical head turns. .      Knee/Hip Exercises: Stretches   Hip Flexor Stretch Right;Left;2 reps;30 seconds  standing, in conjuction with calf stretch   Gastroc Stretch Right;Left;2 reps;30 seconds     Knee/Hip Exercises: Standing   SLS Rt/Lt SLS trials at counter x 4 reps each side:  able to sustain unsupported 1 sec on LLE, 3 sec on RLE.      Knee/Hip Exercises: Seated   Other Seated Knee/Hip Exercises seated single leg abdct with overhead reach opposite direction with same side arm (Power moves) x 3 reps each way.                       PT Long Term Goals - 04/07/17 1320      PT LONG TERM GOAL #1   Title Improve posture and alignment with patient to demonstrate improved upright posture with increased extension through trunk,  hips and knees in standing 03/29/17   Time 10   Period Weeks   Status Partially Met  when upright pt's knees are flexed. some tactile cues for correct forward lean.      PT LONG TERM GOAL #2   Title Increase strength bilat hips to 5-/5 to 5/5 throughout 03/29/17   Time 10   Period Weeks   Status Partially Met     PT LONG TERM GOAL #3   Title Increase hip extension bilaterally to 10 degrees to improve posture and balance 03/29/17   Time 10   Period Weeks   Status Partially Met     PT LONG TERM GOAL #4   Title Patient to be independent in ambulation for functional distances of 300-500 ft with appropriate assistive device 03/29/17   Time 6   Period Weeks   Status Achieved     PT LONG TERM GOAL #5   Title Independent in HEP 03/29/17   Time 10   Period Weeks   Status On-going     PT LONG TERM GOAL #6   Title Improve FOTO to </= 42% limitation 03/29/17   Time 10   Period Weeks   Status On-going     PT LONG TERM GOAL #7   Title Improve BERG =/>53/56 to  improve safety with ambulation   Time 10   Period Weeks   Status On-going     PT LONG TERM GOAL #8   Title Ambulate with straight cane short distances in community with good upright posture   Time 10   Period Weeks   Status On-going               Plan - 04/07/17 1317    Clinical Impression Statement Pt demonstrating improved standing posture with less tactile cues.  He is reporting increased awareness of forward trunk posture and the ability to self correct.  After performing trunk/UE ext against the wall, pt demonstrated more upright posture; encouraged pt to perform this exercise daily.  Pt tolerted all exercises with only one episode of low back pain; resolved with seated rest. Progressing towards remaining goals.    Rehab Potential Good   PT Frequency 2x / week   PT Duration 6 weeks   PT Treatment/Interventions Patient/family education;ADLs/Self Care Home Management;Electrical Stimulation;Iontophoresis 37m/ml Dexamethasone;Moist Heat;Ultrasound;Dry needling;Manual techniques;Neuromuscular re-education;Therapeutic activities;Therapeutic exercise;Gait training   PT Next Visit Plan Continue ext based strengthening and balance exercises.    Consulted and Agree with Plan of Care Patient      Patient will benefit from skilled therapeutic intervention in order to improve the following deficits and impairments:  Postural dysfunction, Improper body mechanics, Abnormal gait, Decreased strength, Decreased mobility, Decreased range of motion, Decreased activity tolerance  Visit Diagnosis: Abnormal posture  Muscle weakness (generalized)  Other abnormalities of gait and mobility     Problem List Patient Active Problem List   Diagnosis Date Noted  . Skin ulcer of second toe, right, limited to breakdown of skin ([place] 09/09/2016  . BMI 29.0-29.9,adult 06/11/2015  . Gastric ulcer 01/14/2015  . Morbid obesity (BMI 30+) 08/26/2014  . Sinus pause   With blocked PACs, PVCs and sinus  arrhythmia 10/10/2013  . Medication management 10/06/2013  . Hyperlipidemia 08/27/2013  . Essential tremor   . Proteinuria   . Prediabetes   . Gout   . Vitamin D deficiency   . Left rotator cuff syndrome 06/11/2012  . Essential hypertension 10/10/2010   JKerin Perna PTA 04/07/17 1:29 PM  [facility] [place], [place], [address] Phone: [phone]   Fax:  [phone]  Name: Leroy Griffin MRN: [id number] Date of Birth: 1946-02-24

## 2017-04-12 ENCOUNTER — Ambulatory Visit (INDEPENDENT_AMBULATORY_CARE_PROVIDER_SITE_OTHER): Payer: Medicare Other | Admitting: Physical Therapy

## 2017-04-12 DIAGNOSIS — M6281 Muscle weakness (generalized): Secondary | ICD-10-CM

## 2017-04-12 DIAGNOSIS — R293 Abnormal posture: Secondary | ICD-10-CM | POA: Diagnosis present

## 2017-04-12 DIAGNOSIS — R2689 Other abnormalities of gait and mobility: Secondary | ICD-10-CM | POA: Diagnosis not present

## 2017-04-12 NOTE — Therapy (Signed)
Leroy Griffin [place], [place], [address] Phone: [phone]   Fax:  [phone]  Physical Therapy Treatment  Patient Details  Name: Leroy Griffin MRN: [id number] Date of Birth: January 07, 1946 Referring Provider: Dr. Wells Guiles Tat   Encounter Date: 04/12/2017      PT End of Session - 04/12/17 1445    Visit Number 15   Number of Visits 20   Date for PT Re-Evaluation 04/28/17   PT Start Time 6967   PT Stop Time 8938  MHP last 12 min    PT Time Calculation (min) 59 min   Activity Tolerance Patient tolerated treatment well   Behavior During Therapy [facility] for tasks assessed/performed      Past Medical History:  Diagnosis Date  . Essential tremor   . Gout   . Hyperlipidemia   . Hypertension   . Parkinson's disease ([place])   . Prediabetes   . Proteinuria   . Vitamin D deficiency     Past Surgical History:  Procedure Laterality Date  . CYST EXCISION     back  . KNEE SURGERY Right   . TONSILLECTOMY      There were no vitals filed for this visit.      Subjective Assessment - 04/12/17 1445    Subjective Pt reports he spent this weekend mowing, weed eating, and repairing his lawnmower (while sitting on floor with straight legs).  "My legs and back are killing me today".    Patient is accompained by: Family member   Currently in Pain? Yes   Pain Score 6    Pain Location Back   Pain Orientation Right;Left;Lower   Pain Descriptors / Indicators Sore   Aggravating Factors  mowing, weed eating   Pain Relieving Factors heat, stretches, rest.    Pain Score 7   Pain Location Knee   Pain Orientation Right;Left   Pain Descriptors / Indicators Sore   Aggravating Factors  sitting too long, climbing stairs   Pain Relieving Factors medication, stretches             [facility] PT Assessment - 04/12/17 0001      Assessment   Medical Diagnosis Low back pain; Gait instability    Referring Provider Dr. Wells Guiles Tat    Onset Date/Surgical Date 02/05/17   Hand Dominance Left   Next MD Visit PRN         [facility] Adult PT Treatment/Exercise - 04/12/17 0001      Lumbar Exercises: Supine   Other Supine Lumbar Exercises trial of thoracic ext over bolster; low tolerance to this position - stopped.      Knee/Hip Exercises: Stretches   Passive Hamstring Stretch Right;Left;2 reps;30 seconds   Quad Stretch Right;Left;2 reps;20 seconds   Hip Flexor Stretch Right;Left;2 reps;30 seconds  standing, in conjuction with calf stretch   Piriformis Stretch Right;Left;1 rep;20 seconds   Gastroc Stretch Right;Left;2 reps;30 seconds   Other Knee/Hip Stretches standing adductor stretch at counter x 20 sec each leg   Other Knee/Hip Stretches seated thoracic ext x 3 reps      Knee/Hip Exercises: Standing   SLS Rt/Lt SLS trials at counter x 4 reps each side:  able to sustain unsupported 1 sec on LLE, 3 sec on RLE.    Gait Training Gait training with SPC with close SBA: tactile cues for posture, VC for increased step length of LLE, and placement of cane more laterally to avoid tripping over cane x 120 ft,. Improved  gait quality with cues and repetition.     Other Standing Knee Exercises Rt/Lt toe taps to sani-wipe container with occasional UE support on counter x 10 reps each side.     Other Standing Knee Exercises trunk ext with over head reach with arms up on cabinets with hip ext x 10 reps each leg.                      PT Long Term Goals - 04/12/17 1456      PT LONG TERM GOAL #1   Title Improve posture and alignment with patient to demonstrate improved upright posture with increased extension through trunk, hips and knees in standing 03/29/17   Time 10   Period Weeks   Status Partially Met     PT LONG TERM GOAL #2   Title Increase strength bilat hips to 5-/5 to 5/5 throughout 03/29/17   Time 10   Period Weeks   Status Partially Met     PT LONG TERM GOAL #3   Title Increase hip extension bilaterally to 10  degrees to improve posture and balance 03/29/17   Time 10   Period Weeks   Status Partially Met     PT LONG TERM GOAL #4   Title Patient to be independent in ambulation for functional distances of 300-500 ft with appropriate assistive device 03/29/17   Period Weeks   Status Achieved     PT LONG TERM GOAL #5   Title Independent in HEP 03/29/17   Time 10   Period Weeks   Status On-going     PT LONG TERM GOAL #6   Title Improve FOTO to </= 42% limitation 03/29/17               Plan - 04/12/17 1714    Clinical Impression Statement Pt's standing balance is gradually improving.  [name] tolerated all exercises well, without increase in pain, except thoracic ext over soft bolster in hooklying.  Pt making gradual gains towards remaining goals.    Rehab Potential Good   PT Frequency 2x / week   PT Duration 6 weeks   PT Treatment/Interventions Patient/family education;ADLs/Self Care Home Management;Electrical Stimulation;Iontophoresis 79m/ml Dexamethasone;Moist Heat;Ultrasound;Dry needling;Manual techniques;Neuromuscular re-education;Therapeutic activities;Therapeutic exercise;Gait training   PT Next Visit Plan Continue ext based strengthening and balance exercises.    Consulted and Agree with Plan of Care Patient      Patient will benefit from skilled therapeutic intervention in order to improve the following deficits and impairments:  Postural dysfunction, Improper body mechanics, Abnormal gait, Decreased strength, Decreased mobility, Decreased range of motion, Decreased activity tolerance  Visit Diagnosis: Abnormal posture  Muscle weakness (generalized)  Other abnormalities of gait and mobility     Problem List Patient Active Problem List   Diagnosis Date Noted  . Skin ulcer of second toe, right, limited to breakdown of skin ([place] 09/09/2016  . BMI 29.0-29.9,adult 06/11/2015  . Gastric ulcer 01/14/2015  . Morbid obesity (BMI 30+) 08/26/2014  . Sinus pause   With blocked  PACs, PVCs and sinus arrhythmia 10/10/2013  . Medication management 10/06/2013  . Hyperlipidemia 08/27/2013  . Essential tremor   . Proteinuria   . Prediabetes   . Gout   . Vitamin D deficiency   . Left rotator cuff syndrome 06/11/2012  . Essential hypertension 10/10/2010   JKerin Perna PTA 04/12/17 5:22 PM  [facility] [address] [place] [address]Phone: [phone]  Fax:  [phone]  Name: Leroy Griffin MRN: [id number] Date of Birth: [birth year]

## 2017-04-14 ENCOUNTER — Ambulatory Visit (INDEPENDENT_AMBULATORY_CARE_PROVIDER_SITE_OTHER): Payer: Medicare Other | Admitting: Physical Therapy

## 2017-04-14 DIAGNOSIS — R293 Abnormal posture: Secondary | ICD-10-CM | POA: Diagnosis present

## 2017-04-14 DIAGNOSIS — M6281 Muscle weakness (generalized): Secondary | ICD-10-CM

## 2017-04-14 DIAGNOSIS — R2689 Other abnormalities of gait and mobility: Secondary | ICD-10-CM | POA: Diagnosis not present

## 2017-04-14 NOTE — Therapy (Signed)
[place], [place], [address] Phone: [phone]   Fax:  [phone]  Physical Therapy Treatment  Patient Details  Name: Leroy Griffin MRN: [id number] Date of Birth: [birth year] Referring Provider: Dr. Wells Guiles Tat  Encounter Date: 04/14/2017      PT End of Session - 04/14/17 1151    Visit Number 16   Number of Visits 20   Date for PT Re-Evaluation 04/28/17   PT Start Time 1102   PT Stop Time 1147   PT Time Calculation (min) 45 min      Past Medical History:  Diagnosis Date  . Essential tremor   . Gout   . Hyperlipidemia   . Hypertension   . Parkinson's disease ([place])   . Prediabetes   . Proteinuria   . Vitamin D deficiency     Past Surgical History:  Procedure Laterality Date  . CYST EXCISION     back  . KNEE SURGERY Right   . TONSILLECTOMY      There were no vitals filed for this visit.      Subjective Assessment - 04/14/17 1110    Subjective Pt has almost recovered from working on lawn.  He loved the heat on knees. He bought ice pack/ heating pad for home.    Pertinent History Spinal stenosis; HNP cervical and lumbar disc; bone spurs; kidney; SOB; cardiac - ventricular problem decreased ejection fraction; fx patella and scope Rt knee in remote past; Lt knee pain    Currently in Pain? Yes   Pain Score 2    Pain Location Back   Pain Orientation Right;Left   Pain Descriptors / Indicators Sore   Aggravating Factors  leaning forward    Pain Relieving Factors heat/ stretches   Multiple Pain Sites Yes   Pain Score 2   Pain Location Knee   Pain Orientation Left   Pain Descriptors / Indicators Sore   Aggravating Factors  sitting too long, climbing stairs   Pain Relieving Factors medication, stretches            [facility] PT Assessment - 04/14/17 0001      Assessment   Medical Diagnosis Low back pain; Gait instability    Referring Provider Dr. Wells Guiles Tat   Onset  Date/Surgical Date 02/05/17   Hand Dominance Left   Next MD Visit PRN          [facility] Adult PT Treatment/Exercise - 04/14/17 0001      Knee/Hip Exercises: Aerobic   Tread Mill 0.8-1.4 mph x 2 min - VC to increase step height.      Knee/Hip Exercises: Standing   Heel Raises Both;1 set;10 reps   Hip Abduction Stengthening;Right;Left;2 sets;10 reps;Knee straight  VC for foot position   Hip Extension Stengthening;Right;Left;2 sets;10 reps;Knee straight   SLS Rt/Lt SLS x 15 sec each side (light UE support); Rt/Lt SLS with opp leg toe taps front/ side/ back x 5 reps each side.     Gait Training Gait without AD ~ 100 ft with SBA, VC for posture and increased step height to decrease shuffle.    Other Standing Knee Exercises tandem walk forward/ backward with light UE support on counter (10 ft counter) x 2 reps - SBA for safety     Short seated rest breaks between exercises to recover from fatigue with standing exercises.         PT Education - 04/14/17 1139    Education provided Yes  Education Details HEP    Person(s) Educated Patient   Methods Explanation;Handout   Comprehension Verbalized understanding             PT Long Term Goals - 04/12/17 1456      PT LONG TERM GOAL #1   Title Improve posture and alignment with patient to demonstrate improved upright posture with increased extension through trunk, hips and knees in standing 03/29/17   Time 10   Period Weeks   Status Partially Met     PT LONG TERM GOAL #2   Title Increase strength bilat hips to 5-/5 to 5/5 throughout 03/29/17   Time 10   Period Weeks   Status Partially Met     PT LONG TERM GOAL #3   Title Increase hip extension bilaterally to 10 degrees to improve posture and balance 03/29/17   Time 10   Period Weeks   Status Partially Met     PT LONG TERM GOAL #4   Title Patient to be independent in ambulation for functional distances of 300-500 ft with appropriate assistive device 03/29/17   Period Weeks    Status Achieved     PT LONG TERM GOAL #5   Title Independent in HEP 03/29/17   Time 10   Period Weeks   Status On-going     PT LONG TERM GOAL #6   Title Improve FOTO to </= 42% limitation 03/29/17               Plan - 04/14/17 1244    Clinical Impression Statement Pt tolerated all exercises well today, without increase in symptoms, nor loss of balance.  He demonstrated functional hip weakness in abductors with SLS exercises.  Pt has good HEP established to continue increasing hip strength, improving posture and balance.  He is agreeable to hold 2 wks while he continues to work on [employer].  Time spent educating pt regarding posture, safety with gait in community and home, and how to pace himself with exercise at home. He is making gradual progress towards remaining goals.    Rehab Potential Good   Clinical Impairments Affecting Rehab Potential decreased endurance and exercise tolerance due to cardiac problems; long standing decline in ambulatory status    PT Frequency 2x / week   PT Duration 6 weeks   PT Treatment/Interventions Patient/family education;ADLs/Self Care Home Management;Electrical Stimulation;Iontophoresis 42m/ml Dexamethasone;Moist Heat;Ultrasound;Dry needling;Manual techniques;Neuromuscular re-education;Therapeutic activities;Therapeutic exercise;Gait training   PT Next Visit Plan Spoke to supervising PT; will hold treatment for 2 wks. Pt to return in 2 wks and will assess readiness to d/c vs additional visits.    Consulted and Agree with Plan of Care Patient      Patient will benefit from skilled therapeutic intervention in order to improve the following deficits and impairments:  Postural dysfunction, Improper body mechanics, Abnormal gait, Decreased strength, Decreased mobility, Decreased range of motion, Decreased activity tolerance  Visit Diagnosis: Abnormal posture  Muscle weakness (generalized)  Other abnormalities of gait and mobility     Problem  List Patient Active Problem List   Diagnosis Date Noted  . Skin ulcer of second toe, right, limited to breakdown of skin ([place] 09/09/2016  . BMI 29.0-29.9,adult 06/11/2015  . Gastric ulcer 01/14/2015  . Morbid obesity (BMI 30+) 08/26/2014  . Sinus pause   With blocked PACs, PVCs and sinus arrhythmia 10/10/2013  . Medication management 10/06/2013  . Hyperlipidemia 08/27/2013  . Essential tremor   . Proteinuria   . Prediabetes   . Gout   .  Vitamin D deficiency   . Left rotator cuff syndrome 06/11/2012  . Essential hypertension 10/10/2010   Kerin Perna, PTA 04/14/17 12:53 PM  [place], [place], [address] Phone: [phone]   Fax:  [phone]  Name: Leroy Griffin MRN: [id number] Date of Birth: 04/02/1946

## 2017-04-14 NOTE — Patient Instructions (Signed)
*   toe taps to cup  * side leg lifts  *back leg lifts  * walk standing upright   * wall press with back/arms against wall. (10 second isometrics x 10 reps)

## 2017-04-28 ENCOUNTER — Ambulatory Visit (INDEPENDENT_AMBULATORY_CARE_PROVIDER_SITE_OTHER): Payer: Medicare Other | Admitting: Physical Therapy

## 2017-04-28 DIAGNOSIS — M6281 Muscle weakness (generalized): Secondary | ICD-10-CM | POA: Diagnosis not present

## 2017-04-28 DIAGNOSIS — R2689 Other abnormalities of gait and mobility: Secondary | ICD-10-CM | POA: Diagnosis not present

## 2017-04-28 DIAGNOSIS — R293 Abnormal posture: Secondary | ICD-10-CM | POA: Diagnosis present
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
## 2017-05-01 ENCOUNTER — Other Ambulatory Visit: Payer: Self-pay | Admitting: Internal Medicine

## 2017-06-05 DIAGNOSIS — N189 Chronic kidney disease, unspecified: Secondary | ICD-10-CM | POA: Diagnosis not present

## 2017-06-10 DIAGNOSIS — Z23 Encounter for immunization: Secondary | ICD-10-CM | POA: Diagnosis not present

## 2017-06-17 ENCOUNTER — Other Ambulatory Visit: Payer: Self-pay | Admitting: Internal Medicine

## 2017-06-17 NOTE — Telephone Encounter (Signed)
Please call T[facility] # 3 

## 2017-06-19 NOTE — Telephone Encounter (Signed)
Tyl # 3 has been called into pharmacy on Nov 12 th 2018 by DD

## 2017-07-04 NOTE — Progress Notes (Signed)
MEDICARE ANNUAL WELLNESS VISIT AND FOLLOW UP Assessment:   Essential hypertension - continue medications, DASH diet, exercise and monitor at home. Call if greater than 130/80.  -     CBC with Differential/Platelet -     BASIC METABOLIC PANEL WITH GFR -     Hepatic function panel -     TSH  Hyperlipidemia -continue medications, check lipids, decrease fatty foods, increase activity.  -     Lipid panel  Proteinuria, unspecified type -     BASIC METABOLIC PANEL WITH GFR  Medication management -     Magnesium  Prediabetes Discussed disease progression and risks Discussed diet/exercise, weight management and risk modification  Idiopathic gout, unspecified chronicity, unspecified site Gout- recheck Uric acid as needed, Diet discussed, continue medications.  Essential tremor MONITOR  Overactive bladder myrbetriq samples given  Imbalance Suggest getting a pool, following up with ortho Can no longer prescribe long term pain medications, patient understands -     Ambulatory referral to Orthopedics  Midline low back pain without sciatica, unspecified chronicity -     Ambulatory referral to Orthopedics  Acute pain of both knees -     Ambulatory referral to Orthopedics  Encounter for Medicare annual wellness exam 1 year  Advanced care planning/counseling discussion Discussed advanced care planning with patient and/or family At least 30 mins discussed with the patient and/or family at the visit.   Future Appointments  Date Time Provider Department Center  10/09/2017 11:00 AM Lucky Cowboy, MD [facility] None  04/30/2018  3:00 PM Lucky Cowboy, MD [facility] None     Plan:   During the course of the visit the patient was educated and counseled about appropriate screening and preventive services including:    Pneumococcal vaccine   Hepatitis B vaccine  Td vaccine  Screening electrocardiogram  Colorectal cancer screening  Diabetes screening  Glaucoma  screening  Nutrition counseling    Subjective:  Leroy Griffin is a 71 y.o. male who presents for Medicare Annual Wellness Visit and 3 month follow up for HTN, hyperlipidemia, prediabetes, and vitamin D Def.   Patient has hx/o Essential tremor, follows with Dr. Arbutus Leas, negative work up for Parkinson's for his gait and had high volume LP with no improvement in his gait- thought to be due to OA. He did out patient rehab and states it was better but now has bilateral knee pain, right worse than left. He takes tylenol #3 but we have discussed that as PCP we can not continuously prescribe this.   Would like to see [facility].   Has urinary frequency, nocturia x 4-5, very rare incontinence if he waits too long. Occ hesitancy, dribbling, weak stream.    He does workout. He denies chest pain, shortness of breath, dizziness. BP: 124/80  Patient had negative screening stress test and Cardiac MRI in Feb 2017 per Dr Graciela Husbands.  He is on cholesterol medication and denies myalgias. His cholesterol is at goal. The cholesterol last visit was:   Lab Results  Component Value Date   CHOL 199 03/28/2017   HDL 53 03/28/2017   LDLCALC 90 12/01/2016   TRIG 369 (H) 03/28/2017   CHOLHDL 3.8 03/28/2017   He has been working on diet and exercise for prediabetes, and denies paresthesia of the feet, polydipsia and polyuria. Last A1C in the office was:  Lab Results  Component Value Date   HGBA1C 5.2 03/28/2017   Patient also has Idiopathic proteinuria & CKD2 (GFR 74 ml/min)  followed by Dr Harvin Hazel  [place].  Lab Results  Component Value Date   GFRNONAA 63 03/28/2017   Patient is on Vitamin D supplement.   He has a history of GI bleeds.  BMI is Body mass index is 32.36 kg/m., he is working on diet and exercise. Wt Readings from Last 3 Encounters:  07/05/17 238 lb 9.6 oz (108.2 kg)  03/28/17 232 lb 3.2 oz (105.3 kg)  03/16/17 231 lb (104.8 kg)    Names of Other Physician/Practitioners you currently use: 1.  [facility] here for primary care 2. Dr. Neale BurlyFreeman, [place], last visit July 2018 3. Dr. Pollyann KennedyaylorDr/ [place] , dentist, last visit q 3 months Patient Care Team: Lucky CowboyMcKeown, William, MD as PCP - General (Internal Medicine) Sharyn LullHaverstock, Elvin Sohristina L, MD as Referring Physician (Dermatology) Mittie BodoKlein, Jeffrey L, MD as Referring Physician (Cardiology) Annie SableGoldsborough, Kellie, MD as Consulting Physician (Nephrology) Marney SettingPeters, Randy, MD as Referring Physician (Gastroenterology)  Medication Review: Current Outpatient Medications on File Prior to Visit  Medication Sig Dispense Refill  . acetaminophen-codeine (TYLENOL #3) 300-30 MG tablet Take 1 tablet every 4 to 6 hours if needed for pain 30 tablet 0  . allopurinol (ZYLOPRIM) 300 MG tablet TAKE 1/2 TO 1 TABLET BY MOUTH DAILY AS DIRECTED FOR GOUT 90 tablet 1  . atorvastatin (LIPITOR) 20 MG tablet TAKE 1/2 TO 1 TABLET BY MOUTH EVERY DAY FOR CHOLESTEROL 90 tablet 1  . Cholecalciferol (VITAMIN D) 2000 units tablet Take 10,000 Units by mouth every morning.     . Flaxseed, Linseed, (FLAX SEED OIL) 1000 MG CAPS Take 1,000 mg by mouth every evening.     Marland Kitchen. losartan (COZAAR) 100 MG tablet TAKE 1/2 TABLET BY MOUTH TWICE DAILY 90 tablet 1  . MAGNESIUM PO Take 50 mg by mouth 2 (two) times daily.    . Minoxidil (ROGAINE MENS) 5 % FOAM Apply 1 application topically daily.    . Omega-3 Fatty Acids (FISH OIL) 1200 MG CPDR Take 2 capsules by mouth at bedtime.     . triamcinolone ointment (KENALOG) 0.1 % Apply 1 application topically 3 (three) times daily. (Patient taking differently: Apply 1 application topically 2 (two) times daily as needed. ) 80 g 3  . vitamin B-12 (CYANOCOBALAMIN) 1000 MCG tablet Take 1,000 mcg by mouth daily.    Marland Kitchen. zinc gluconate 50 MG tablet Take 50 mg by mouth daily.     No current facility-administered medications on file prior to visit.     Current Problems (verified) Patient Active Problem List    Diagnosis Date Noted  . Skin ulcer of second toe, right, limited to breakdown of skin (HCC) 09/09/2016  . BMI 29.0-29.9,adult 06/11/2015  . Gastric ulcer 01/14/2015  . Morbid obesity (BMI 30+) 08/26/2014  . Sinus pause   With blocked PACs, PVCs and sinus arrhythmia 10/10/2013  . Medication management 10/06/2013  . Hyperlipidemia 08/27/2013  . Essential tremor   . Proteinuria   . Prediabetes   . Gout   . Vitamin D deficiency   . Essential hypertension 10/10/2010    Screening Tests Immunization History  Administered Date(s) Administered  . Influenza Split 05/23/2013  . Influenza, High Dose Seasonal PF 05/14/2014, 06/11/2015, 05/19/2016  . Pneumococcal Conjugate-13 12/03/2014  . Pneumococcal Polysaccharide-23 06/01/2010  . Td 06/01/2010  . Tdap 01/28/2014  . Zoster 08/08/2004    Preventative care: Last colonoscopy: 2013 EGD: 2016 Echo 08/2015 Stress test 09/2015  Prior vaccinations: TD or Tdap: 2015 Influenza: 2018 Pneumococcal: 2011 Prevnar 2016 Shingles/Zostavax: 2006  Allergies  Allergies  Allergen Reactions  . Enalapril Rash  . Aspirin     Internal bleeding  . Nsaids     Internal bleeding  . Welchol [Colesevelam Hcl] Diarrhea  . Vasotec [Enalaprilat] Rash    SURGICAL HISTORY He  has a past surgical history that includes Knee surgery (Right); Tonsillectomy; and Cyst excision. FAMILY HISTORY His family history includes Dementia in his mother; Emphysema in his brother; Heart disease in his brother; Stroke in his father. SOCIAL HISTORY He  reports that  has never smoked. he has never used smokeless tobacco. He reports that he drinks about 7.0 oz of alcohol per week. He reports that he does not use drugs. He has a new grandson Mosetta Putt, 22 year old. DAUGHTER ERICA  MEDICARE WELLNESS OBJECTIVES: Physical activity: Current Exercise Habits: The patient does not participate in regular exercise at present Cardiac risk factors:   Depression/mood screen:   Depression  screen [facility] 2/9 07/05/2017  Decreased Interest 0  Down, Depressed, Hopeless 0  PHQ - 2 Score 0    ADLs:  In your present state of health, do you have any difficulty performing the following activities: 07/05/2017 03/28/2017  Hearing? N N  Vision? N N  Difficulty concentrating or making decisions? N N  Walking or climbing stairs? N N  Comment - -  Dressing or bathing? N -  Doing errands, shopping? N N  Some recent data might be hidden     Cognitive Testing  Alert? Yes  Normal Appearance?Yes  Oriented to person? Yes  Place? Yes   Time? Yes  Recall of three objects?  Yes  Can perform simple calculations? Yes  Displays appropriate judgment?Yes  Can read the correct time from a watch face?Yes  EOL planning: Does Patient Have a Medical Advance Directive?: Yes Type of Advance Directive: Living will Does patient want to make changes to medical advance directive?: Yes (MAU/Ambulatory/Procedural Areas - Information given)    Objective:   Blood pressure 124/80, pulse 78, temperature 97.6 F (36.4 C), resp. rate 14, height 6' (1.829 m), weight 238 lb 9.6 oz (108.2 kg), SpO2 93 %. Body mass index is 32.36 kg/m.  General appearance: alert, no distress, WD/WN, male, Mask facies HEENT: normocephalic, sclerae anicteric, TMs pearly, nares patent, no discharge or erythema, pharynx normal Oral cavity: MMM, no lesions Neck: supple, no lymphadenopathy, no thyromegaly, no masses Heart: RRR, normal S1, S2, no murmurs Lungs: CTA bilaterally, no wheezes, rhonchi, or rales Abdomen: +bs, soft, non tender, non distended, no masses, no hepatomegaly, no splenomegaly Musculoskeletal: nontender, no swelling, no obvious deformity Extremities: no edema, no cyanosis, no clubbing Pulses: 2+ symmetric, upper and lower extremities, normal cap refill Neurological: alert, oriented x 3, CN2-12 intact, strength normal upper extremities and lower extremities, cogwheel rigidity present in bilateral upper  extremities, mild tremor of the right hand present, sensation normal throughout, DTRs 2+ throughout, no cerebellar signs, shuffling bent over gait with cane.  Psychiatric: normal affect, behavior normal, pleasant     Medicare Attestation I have personally reviewed: The patient's medical and social history Their use of alcohol, tobacco or illicit drugs Their current medications and supplements The patient's functional ability including ADLs,fall risks, home safety risks, cognitive, and hearing and visual impairment Diet and physical activities Evidence for depression or mood disorders  The patient's weight, height, BMI, and visual acuity have been recorded in the chart.  I have made referrals, counseling, and provided education to the patient based on review of the above and I have provided  the patient with a written personalized care plan for preventive services.     Quentin Mullingmanda Eleaner Dibartolo, PA-C   07/05/2017

## 2017-07-05 ENCOUNTER — Ambulatory Visit (INDEPENDENT_AMBULATORY_CARE_PROVIDER_SITE_OTHER): Payer: Medicare Other | Admitting: Physician Assistant

## 2017-07-05 ENCOUNTER — Encounter: Payer: Self-pay | Admitting: Physician Assistant

## 2017-07-05 VITALS — BP 124/80 | HR 78 | Temp 97.6°F | Resp 14 | Ht 72.0 in | Wt 238.6 lb

## 2017-07-05 DIAGNOSIS — M545 Low back pain, unspecified: Secondary | ICD-10-CM

## 2017-07-05 DIAGNOSIS — N3281 Overactive bladder: Secondary | ICD-10-CM

## 2017-07-05 DIAGNOSIS — Z0000 Encounter for general adult medical examination without abnormal findings: Secondary | ICD-10-CM

## 2017-07-05 DIAGNOSIS — G250 Essential tremor: Secondary | ICD-10-CM | POA: Diagnosis not present

## 2017-07-05 DIAGNOSIS — E782 Mixed hyperlipidemia: Secondary | ICD-10-CM | POA: Diagnosis not present

## 2017-07-05 DIAGNOSIS — I10 Essential (primary) hypertension: Secondary | ICD-10-CM

## 2017-07-05 DIAGNOSIS — M1000 Idiopathic gout, unspecified site: Secondary | ICD-10-CM

## 2017-07-05 DIAGNOSIS — Z7189 Other specified counseling: Secondary | ICD-10-CM

## 2017-07-05 DIAGNOSIS — M25561 Pain in right knee: Secondary | ICD-10-CM

## 2017-07-05 DIAGNOSIS — R7303 Prediabetes: Secondary | ICD-10-CM

## 2017-07-05 DIAGNOSIS — R2689 Other abnormalities of gait and mobility: Secondary | ICD-10-CM | POA: Diagnosis not present

## 2017-07-05 DIAGNOSIS — M25562 Pain in left knee: Secondary | ICD-10-CM

## 2017-07-05 DIAGNOSIS — R809 Proteinuria, unspecified: Secondary | ICD-10-CM

## 2017-07-05 DIAGNOSIS — Z79899 Other long term (current) drug therapy: Secondary | ICD-10-CM | POA: Diagnosis not present

## 2017-07-05 LAB — HEPATIC FUNCTION PANEL
AG Ratio: 1.5 (calc) (ref 1.0–2.5)
ALT: 16 U/L (ref 9–46)
AST: 21 U/L (ref 10–35)
Albumin: 4 g/dL (ref 3.6–5.1)
Alkaline phosphatase (APISO): 54 U/L (ref 40–115)
Bilirubin, Direct: 0.2 mg/dL (ref 0.0–0.2)
Globulin: 2.7 g/dL (calc) (ref 1.9–3.7)
Indirect Bilirubin: 0.7 mg/dL (calc) (ref 0.2–1.2)
Total Bilirubin: 0.9 mg/dL (ref 0.2–1.2)
Total Protein: 6.7 g/dL (ref 6.1–8.1)

## 2017-07-05 LAB — CBC WITH DIFFERENTIAL/PLATELET
Basophils Absolute: 29 cells/uL (ref 0–200)
Basophils Relative: 0.7 %
Eosinophils Absolute: 131 cells/uL (ref 15–500)
Eosinophils Relative: 3.2 %
HCT: 48.6 % (ref 38.5–50.0)
Hemoglobin: 16.7 g/dL (ref 13.2–17.1)
Lymphs Abs: 1681 cells/uL (ref 850–3900)
MCH: 32.8 pg (ref 27.0–33.0)
MCHC: 34.4 g/dL (ref 32.0–36.0)
MCV: 95.5 fL (ref 80.0–100.0)
MPV: 9.7 fL (ref 7.5–12.5)
Monocytes Relative: 11.1 %
Neutro Abs: 1804 cells/uL (ref 1500–7800)
Neutrophils Relative %: 44 %
Platelets: 159 10*3/uL (ref 140–400)
RBC: 5.09 10*6/uL (ref 4.20–5.80)
RDW: 13.1 % (ref 11.0–15.0)
Total Lymphocyte: 41 %
WBC mixed population: 455 cells/uL (ref 200–950)
WBC: 4.1 10*3/uL (ref 3.8–10.8)

## 2017-07-05 LAB — BASIC METABOLIC PANEL WITH GFR
BUN: 22 mg/dL (ref 7–25)
CO2: 32 mmol/L (ref 20–32)
Calcium: 10.3 mg/dL (ref 8.6–10.3)
Chloride: 105 mmol/L (ref 98–110)
Creat: 1.11 mg/dL (ref 0.70–1.18)
GFR, Est African American: 76 mL/min/{1.73_m2} (ref >=60)
GFR, Est Non African American: 66 mL/min/{1.73_m2} (ref >=60)
Glucose, Bld: 102 mg/dL — ABNORMAL HIGH (ref 65–99)
Potassium: 4.9 mmol/L (ref 3.5–5.3)
Sodium: 143 mmol/L (ref 135–146)

## 2017-07-05 LAB — TSH: TSH: 1.95 mIU/L (ref 0.40–4.50)

## 2017-07-05 LAB — LIPID PANEL
Cholesterol: 226 mg/dL — ABNORMAL HIGH (ref <200)
HDL: 58 mg/dL (ref >40)
LDL Cholesterol (Calc): 145 mg/dL (calc) — ABNORMAL HIGH
Non-HDL Cholesterol (Calc): 168 mg/dL (calc) — ABNORMAL HIGH (ref <130)
Total CHOL/HDL Ratio: 3.9 (calc) (ref <5.0)
Triglycerides: 117 mg/dL (ref <150)

## 2017-07-05 LAB — MAGNESIUM: Magnesium: 2 mg/dL (ref 1.5–2.5)

## 2017-07-05 NOTE — Patient Instructions (Addendum)
Community Occupational psychologist of Services Cost  A Matter of Balance Class locations vary. Call [place] on Aging for more information.  [email or web address] [phone] 8-Session program addressing the fear of falling and increasing activity levels of older adults Free to minimal cost  A.C.T. By [employer] [address], [place], [place] [address].  BetaBlues.dk [phone]  Personal training, gym, classes including Silver Sneakers* and ACTion for Aging Adults Fee-based  A.H.O.Y. (Add Health to [place]) Airs on Time [employer] 13, M-F at [place]: [employer],  [place] [facility] [place] [place],  [place], [facility], [address] [facility], [place], [place], [place] [address]  [place] Location: Sharrell Ku. [employer] [place]      [phone]  [phone]  [phone]  [phone]  [phone]  [phone]  [phone]  [phone]  [phone]  [phone]    [phone] A total-body conditioning class for adults 77 and older; designed to increase muscular strength, endurance, range of movement, flexibility, balance, agility and coordination Free  [facility] [place], [place] [address] [place]      [address]      [phone]      Pilate's class for individualsreturning to exercise after an injury, before or after surgery or for individuals with complex musculoskeletal issues; designed to improve strength, balance , flexibility      $15/class  [place] [address] [place], [place] [address] [email or web address] [place] classes for beginners to advanced [place], [place] [address] [email or web address] [place] Chair Exercises Free, ages 77 and older; Ages 43-59 fee based  Marvia Pickles, [facility] [address] [place], [place] [address] Seniorcenter_1 .Beverlee Nims [phone]  A.H.O.Y. Tai Chi Fee-based Donation based or free  [place] Class locations vary.  Call or email Angela Burke or view website for more information. Info_[email or web address] .[email or web address] [email or web address] [phone] Ongoing classes at local YMCAs and gyms Fee-based  Silver Sneakers A.C.T. By [place] [employer]: [facility] [place] [phone]  [phone] Classes designed for older adults who want to improve their strength, flexibility, balance and endurance.   Silver sneakers is covered by some insurance plans and includes a fitness center membership at participating locations. Find out more by calling [phone] or visiting [email or web address] Covered by some insurance plans  [facility] [place] [phone] A.H.O.Y., fitness room, personal training, fitness classes for injury prevention, strength, balance, flexibility, water fitness classes Ages 55+: $36 for 6 months; Ages 80-54: $13 for 6 months  Tai Chi for Everybody [facility] [address] [place], [place] [address] Taichiforeverybody_3 .Patsi Sears [phone] Tai Chi classes for beginners to advanced; geared for seniors Donation Based  [employer] (Helpling Others Participate in Exercise     Loyal Gambler. Rosana Hoes, PhD, [place] [email or web address] [place]     [phone]     A comprehensive fitness program for adults.  The program paris senior-level undergraduates Kinesiology students with adults who desire to learn how to exercise safely.  Includes a structural exercise class focusing on functional fitnesss     $100/semester in fall and spring; $75 in summer (no trainers)    *Silver Sneakers is covered by some Personal assistant and includes a  Radio producer at participating locations.  Find out more by calling [phone] or visiting [email or web address]  For additional health and human services resources for senior adults, please contact SeniorLine at [phone] in [place] and [place] at [phone] in all other areas.   WILL SEND TO ORTHO FOR YOU KNEE HIGHLY SUGGEST GETTING IN A POOL FOR YOUR BACK/BALANCE

## 2017-07-10 ENCOUNTER — Other Ambulatory Visit: Payer: Self-pay | Admitting: Internal Medicine

## 2017-07-15 ENCOUNTER — Other Ambulatory Visit: Payer: Self-pay | Admitting: Internal Medicine

## 2017-08-10 ENCOUNTER — Encounter (INDEPENDENT_AMBULATORY_CARE_PROVIDER_SITE_OTHER): Payer: Self-pay | Admitting: Specialist

## 2017-08-10 ENCOUNTER — Ambulatory Visit (INDEPENDENT_AMBULATORY_CARE_PROVIDER_SITE_OTHER): Payer: Medicare Other

## 2017-08-10 ENCOUNTER — Ambulatory Visit (INDEPENDENT_AMBULATORY_CARE_PROVIDER_SITE_OTHER): Payer: Medicare Other | Admitting: Specialist

## 2017-08-10 ENCOUNTER — Ambulatory Visit (INDEPENDENT_AMBULATORY_CARE_PROVIDER_SITE_OTHER): Payer: Self-pay

## 2017-08-10 VITALS — BP 128/79 | HR 90 | Ht 72.0 in | Wt 238.0 lb

## 2017-08-10 DIAGNOSIS — M4316 Spondylolisthesis, lumbar region: Secondary | ICD-10-CM | POA: Diagnosis not present

## 2017-08-10 DIAGNOSIS — M25561 Pain in right knee: Secondary | ICD-10-CM

## 2017-08-10 DIAGNOSIS — M48062 Spinal stenosis, lumbar region with neurogenic claudication: Secondary | ICD-10-CM

## 2017-08-10 DIAGNOSIS — M545 Low back pain: Secondary | ICD-10-CM | POA: Diagnosis not present

## 2017-08-10 DIAGNOSIS — M25562 Pain in left knee: Secondary | ICD-10-CM | POA: Diagnosis not present

## 2017-08-10 DIAGNOSIS — M4014 Other secondary kyphosis, thoracic region: Secondary | ICD-10-CM

## 2017-08-10 MED ORDER — OMEPRAZOLE 20 MG PO CPDR: 20.0000 mg | DELAYED_RELEASE_CAPSULE | Freq: Every day | ORAL | 3 refills | Status: DC

## 2017-08-10 MED ORDER — DICLOFENAC SODIUM 1 % TD GEL: 4.0000 g | Freq: Four times a day (QID) | TRANSDERMAL | 3 refills | Status: DC

## 2017-08-10 MED ORDER — TRAMADOL HCL 50 MG PO TABS: 50.0000 mg | ORAL_TABLET | Freq: Four times a day (QID) | ORAL | 0 refills | Status: DC | PRN

## 2017-08-10 NOTE — Patient Instructions (Addendum)
Avoid bending, stooping and avoid lifting weights greater than 10 lbs. Avoid prolong standing and walking. Avoid frequent bending and stooping  No lifting greater than 10 lbs. May use ice or moist heat for pain. Weight loss is of benefit. Handicap license is approved. The main ways of treat osteoarthritis, that are found to be success. Weight loss helps to decrease pain. Exercise is important to maintaining cartilage and thickness and strengthening. Avoid NSAIDs like motrin, tylenol, alleve are meds decreasing the inflamation. May try transdermal voltaren gel.  Also use omeprazole.  Ice is okay  In afternoon and evening and hot shower in the am

## 2017-08-10 NOTE — Progress Notes (Signed)
Office Visit Note   Patient: Leroy Griffin           Date of Birth: 05/02/1946           MRN: [id number] Visit Date: 08/10/2017              Requested by: Leroy CowboyMcKeown, William, MD [address] [place], [place] [id number] PCP: Leroy CowboyMcKeown, William, MD   Assessment & Plan: Visit Diagnoses:  1. Pain in both knees, unspecified chronicity   2. Low back pain, unspecified back pain laterality, unspecified chronicity, with sciatica presence unspecified   3. Other secondary kyphosis, thoracic region   4. Spinal stenosis of lumbar region with neurogenic claudication   5. Spondylolisthesis, lumbar region     Plan: Avoid bending, stooping and avoid lifting weights greater than 10 lbs. Avoid prolong standing and walking. Avoid frequent bending and stooping  No lifting greater than 10 lbs. May use ice or moist heat for pain. Weight loss is of benefit. Handicap license is approved. The main ways of treat osteoarthritis, that are found to be success. Weight loss helps to decrease pain. Exercise is important to maintaining cartilage and thickness and strengthening. Avoid NSAIDs like motrin, tylenol, alleve are meds decreasing the inflamation. May try transdermal voltaren gel.  Also use omeprazole.  Ice is okay  In afternoon and evening and hot shower in the am  Follow-Up Instructions: Return in about 6 weeks (around 09/21/2017).   Orders:  Orders Placed This Encounter  Procedures  . XR Knee 1-2 Views Right  . XR Knee 1-2 Views Left  . XR Lumbar Spine 2-3 Views  . Ambulatory referral to Physical Therapy   Meds ordered this encounter  Medications  . traMADol (ULTRAM) 50 MG tablet    Sig: Take 1 tablet (50 mg total) by mouth every 6 (six) hours as needed.    Dispense:  30 tablet    Refill:  0  . diclofenac sodium (VOLTAREN) 1 % GEL    Sig: Apply 4 g topically 4 (four) times daily.    Dispense:  5 Tube    Refill:  3  . omeprazole (PRILOSEC) 20 MG capsule    Sig: Take 1  capsule (20 mg total) by mouth daily.    Dispense:  30 capsule    Refill:  3      Procedures: No procedures performed   Clinical Data: No additional findings.   Subjective: Chief Complaint  Patient presents with  . Lower Back - Pain  . Right Knee - Pain  . Left Knee - Pain    72 year old male with history of previous bilateral knee pain, previous right knee arthroscopy and right knee patella fracture. The left knee has pain intermittantly and he has weakness in walking especially up and down stairs. He uses a walker to ambulate around the house. He has seen Leroy Griffin. Leroy Griffin neurology. He has has parkinson's disease rule out. He went through PT for balance and coordination exercises. They thought his posture was the source of his balance issues. He has a condition of decreased cardiac output and has been evaluated by Leroy Griffin.  There is soreness in the quadriceps and with sitting the pain is better. He would have to stop to recover due to SOB and cardiac. Climbing stairs with SOB.     Review of Systems  Constitutional: Positive for activity change. Negative for appetite change, chills, diaphoresis, fatigue, fever and unexpected weight change.  HENT: Positive for congestion.  Eyes: Negative.   Respiratory: Positive for cough and shortness of breath. Negative for apnea, choking, chest tightness, wheezing and stridor.   Cardiovascular: Negative.  Negative for chest pain, palpitations and leg swelling.  Gastrointestinal: Positive for diarrhea. Negative for abdominal pain, nausea, rectal pain and vomiting.  Endocrine: Negative.   Genitourinary: Negative.  Negative for difficulty urinating, dysuria, enuresis, flank pain, frequency and hematuria.  Musculoskeletal: Positive for back pain and gait problem. Negative for arthralgias, joint swelling, myalgias, neck pain and neck stiffness.  Skin: Negative.  Negative for color change, pallor, rash and wound.  Allergic/Immunologic: Positive  for environmental allergies and food allergies. Negative for immunocompromised state.  Neurological: Positive for dizziness, weakness, light-headedness and numbness.  Hematological: Negative.  Negative for adenopathy. Does not bruise/bleed easily.  Psychiatric/Behavioral: Positive for dysphoric mood and sleep disturbance. Negative for agitation, behavioral problems, confusion, decreased concentration, hallucinations, self-injury and suicidal ideas. The patient is not nervous/anxious and is not hyperactive.      Objective: Vital Signs: BP 128/79 (BP Location: Left Arm, Patient Position: Sitting)   Pulse 90   Ht 6' (1.829 m)   Wt 238 lb (108 kg)   BMI 32.28 kg/m   Physical Exam  Constitutional: He is oriented to person, place, and time. He appears well-developed and well-nourished.  HENT:  Head: Normocephalic and atraumatic.  Eyes: EOM are normal. Pupils are equal, round, and reactive to light.  Neck: Normal range of motion. Neck supple.  Pulmonary/Chest: Effort normal and breath sounds normal.  Abdominal: Soft. Bowel sounds are normal.  Neurological: He is alert and oriented to person, place, and time.  Skin: Skin is warm and dry.  Psychiatric: He has a normal mood and affect. His behavior is normal. Judgment and thought content normal.    Back Exam   Tenderness  The patient is experiencing tenderness in the lumbar and cervical.  Range of Motion  Extension: abnormal  Flexion: normal  Lateral bend right: normal  Lateral bend left: normal  Rotation right: normal  Rotation left: normal   Muscle Strength  The patient has normal back strength. Right Quadriceps:  5/5  Left Quadriceps:  5/5  Right Hamstrings:  5/5  Left Hamstrings:  5/5   Tests  Straight leg raise right: negative Straight leg raise left: negative  Reflexes  Patellar: normal Achilles: normal Biceps: normal Babinski's sign: normal   Other  Toe walk: normal Heel walk: normal Sensation: normal Gait:  normal  Erythema: no back redness Scars: absent  Comments:  Cervical spine ROM about 80% of normal. Decreased lordosis and mild increase in lordosis.      Specialty Comments:  No specialty comments available.  Imaging: No results found.   PMFS History: Patient Active Problem List   Diagnosis Date Noted  . Skin ulcer of second toe, right, limited to breakdown of skin (HCC) 09/09/2016  . BMI 29.0-29.9,adult 06/11/2015  . Gastric ulcer 01/14/2015  . Morbid obesity (BMI 30+) 08/26/2014  . Sinus pause   With blocked PACs, PVCs and sinus arrhythmia 10/10/2013  . Medication management 10/06/2013  . Hyperlipidemia 08/27/2013  . Essential tremor   . Proteinuria   . Prediabetes   . Gout   . Vitamin D deficiency   . Essential hypertension 10/10/2010   Past Medical History:  Diagnosis Date  . Essential tremor   . Gout   . Hyperlipidemia   . Hypertension   . Parkinson's disease (HCC)   . Prediabetes   . Proteinuria   . Vitamin D  deficiency     Family History  Problem Relation Age of Onset  . Dementia Mother   . Stroke Father   . Emphysema Brother   . Heart disease Brother     Past Surgical History:  Procedure Laterality Date  . CYST EXCISION     back  . KNEE SURGERY Right   . TONSILLECTOMY     Social History   Occupational History  . 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 Smoking status: Never Smoker  . Smokeless tobacco: Never Used  Substance and Sexual Activity  . Alcohol use: Yes    Alcohol/week: 7.0 oz    Types: 14 Standard drinks or equivalent per week    Comment: 2 drinks a night (gin and tonic)  . Drug use: No  . Sexual activity: Not on file

## 2017-08-23 ENCOUNTER — Other Ambulatory Visit: Payer: Self-pay | Admitting: Physician Assistant

## 2017-08-23 MED ORDER — MIRABEGRON ER 50 MG PO TB24: 50.0000 mg | ORAL_TABLET | Freq: Every day | ORAL | 5 refills | Status: DC

## 2017-08-24 ENCOUNTER — Ambulatory Visit: Payer: Medicare Other | Admitting: Physical Therapy

## 2017-09-10 ENCOUNTER — Other Ambulatory Visit: Payer: Self-pay | Admitting: Internal Medicine

## 2017-09-10 DIAGNOSIS — I10 Essential (primary) hypertension: Secondary | ICD-10-CM

## 2017-09-21 ENCOUNTER — Ambulatory Visit (INDEPENDENT_AMBULATORY_CARE_PROVIDER_SITE_OTHER): Payer: Medicare Other | Admitting: Specialist

## 2017-10-04 ENCOUNTER — Other Ambulatory Visit (INDEPENDENT_AMBULATORY_CARE_PROVIDER_SITE_OTHER): Payer: Self-pay | Admitting: Specialist

## 2017-10-05 ENCOUNTER — Telehealth (INDEPENDENT_AMBULATORY_CARE_PROVIDER_SITE_OTHER): Payer: Self-pay | Admitting: Radiology

## 2017-10-05 NOTE — Telephone Encounter (Signed)
Refill of tramadol reqeusted, please advise.

## 2017-10-06 NOTE — Telephone Encounter (Signed)
[facility]

## 2017-10-06 NOTE — Telephone Encounter (Signed)
Tramadol refill Request 

## 2017-10-09 ENCOUNTER — Other Ambulatory Visit (INDEPENDENT_AMBULATORY_CARE_PROVIDER_SITE_OTHER): Payer: Self-pay | Admitting: Specialist

## 2017-10-09 ENCOUNTER — Other Ambulatory Visit: Payer: Self-pay | Admitting: *Deleted

## 2017-10-09 ENCOUNTER — Encounter: Payer: Self-pay | Admitting: Internal Medicine

## 2017-10-09 ENCOUNTER — Ambulatory Visit (INDEPENDENT_AMBULATORY_CARE_PROVIDER_SITE_OTHER): Payer: Medicare Other | Admitting: Internal Medicine

## 2017-10-09 VITALS — BP 132/70 | HR 72 | Temp 97.3°F | Resp 18 | Ht 72.0 in | Wt 238.0 lb

## 2017-10-09 DIAGNOSIS — N3281 Overactive bladder: Secondary | ICD-10-CM

## 2017-10-09 DIAGNOSIS — R269 Unspecified abnormalities of gait and mobility: Secondary | ICD-10-CM

## 2017-10-09 DIAGNOSIS — G250 Essential tremor: Secondary | ICD-10-CM

## 2017-10-09 DIAGNOSIS — R7309 Other abnormal glucose: Secondary | ICD-10-CM | POA: Diagnosis not present

## 2017-10-09 DIAGNOSIS — Z79899 Other long term (current) drug therapy: Secondary | ICD-10-CM

## 2017-10-09 DIAGNOSIS — E782 Mixed hyperlipidemia: Secondary | ICD-10-CM

## 2017-10-09 DIAGNOSIS — M1000 Idiopathic gout, unspecified site: Secondary | ICD-10-CM | POA: Diagnosis not present

## 2017-10-09 DIAGNOSIS — R7303 Prediabetes: Secondary | ICD-10-CM

## 2017-10-09 DIAGNOSIS — I10 Essential (primary) hypertension: Secondary | ICD-10-CM

## 2017-10-09 DIAGNOSIS — E559 Vitamin D deficiency, unspecified: Secondary | ICD-10-CM

## 2017-10-09 MED ORDER — OXYBUTYNIN CHLORIDE 5 MG PO TABS: ORAL_TABLET | ORAL | 1 refills | Status: DC

## 2017-10-09 NOTE — Patient Instructions (Signed)

## 2017-10-09 NOTE — Progress Notes (Signed)
This very nice 72 y.o. MWM presents for 6 month follow up with HTN, HLD, Pre-Diabetes and Vitamin D Deficiency. Patient has also been evaluated by Dr Tat for Tremor - presumed Hereditary or Essential and she ruled out Parkinson's. Because of gait difficulty, he also had high volume LP w/o any improvement in his gait.      Patient is treated for HTN (1988) & BP has been controlled at home. Today's BP is at goal -  132/70. Patient has had no complaints of any cardiac type chest pain, palpitations, dyspnea / orthopnea / PND, dizziness, claudication, or dependent edema.     Hyperlipidemia is controlled with diet & meds. Patient denies myalgias or other med SE's. Last Lipids were not at goal: Lab Results  Component Value Date   CHOL 226 (H) 07/05/2017   HDL 58 07/05/2017   LDLCALC 145 12/01/2016   TRIG 117 07/05/2017   CHOLHDL 3.9 07/05/2017      Also, the patient has history of PreDiabetes and has had no symptoms of reactive hypoglycemia, diabetic polys, paresthesias or visual blurring.  Last A1c was Normal & at goal: Lab Results  Component Value Date   HGBA1C 5.2 03/28/2017      Further, the patient also has history of Vitamin D Deficiency and supplements vitamin D without any suspected side-effects. Last vitamin D was   Lab Results  Component Value Date   VD25OH 53 03/28/2017   Current Outpatient Medications on File Prior to Visit  Medication Sig  . allopurinol (ZYLOPRIM) 300 MG tablet TAKE 1/2 TO 1 TABLET BY MOUTH DAILY AS DIRECTED FOR GOUT  . atorvastatin (LIPITOR) 20 MG tablet TAKE 1/2 TO 1 TABLET BY MOUTH EVERY DAY FOR CHOLESTEROL  . Cholecalciferol (VITAMIN D) 2000 units tablet Take 10,000 Units by mouth every morning.   . diclofenac sodium (VOLTAREN) 1 % GEL Apply 4 g topically 4 (four) times daily.  . Flaxseed, Linseed, (FLAX SEED OIL) 1000 MG CAPS Take 1,000 mg by mouth every evening.   Marland Kitchen losartan (COZAAR) 100 MG tablet TAKE 1/2 TABLET BY MOUTH TWICE DAILY  . MAGNESIUM PO  Take 50 mg by mouth 2 (two) times daily.  . Minoxidil (ROGAINE MENS) 5 % FOAM Apply 1 application topically daily.  . Omega-3 Fatty Acids (FISH OIL) 1200 MG CPDR Take 2 capsules by mouth at bedtime.   Marland Kitchen omeprazole (PRILOSEC) 20 MG capsule Take 1 capsule (20 mg total) by mouth daily.  . traMADol (ULTRAM) 50 MG tablet TAKE 1 TABLET BY MOUTH EVERY 6 HOURS AS NEEDED  . triamcinolone ointment (KENALOG) 0.1 % Apply 1 application topically 3 (three) times daily. (Patient taking differently: Apply 1 application topically 2 (two) times daily as needed. )  . vitamin B-12 (CYANOCOBALAMIN) 1000 MCG tablet Take 1,000 mcg by mouth daily.  Marland Kitchen zinc gluconate 50 MG tablet Take 50 mg by mouth daily.   No current facility-administered medications on file prior to visit.    Allergies  Allergen Reactions  . Enalapril Rash  . Aspirin     Internal bleeding  . Nsaids     Internal bleeding  . Welchol [Colesevelam Hcl] Diarrhea  . Vasotec [Enalaprilat] Rash   PMHx:   Past Medical History:  Diagnosis Date  . Essential tremor   . Gout   . Hyperlipidemia   . Hypertension   . Parkinson's disease (HCC)   . Prediabetes   . Proteinuria   . Vitamin D deficiency    Immunization History  Administered Date(s) Administered  . Influenza Split 05/23/2013  . Influenza, High Dose Seasonal PF 05/14/2014, 06/11/2015, 05/19/2016  . Pneumococcal Conjugate-13 12/03/2014  . Pneumococcal Polysaccharide-23 06/01/2010  . Td 06/01/2010  . Tdap 01/28/2014  . Zoster 08/08/2004   Past Surgical History:  Procedure Laterality Date  . CYST EXCISION     back  . KNEE SURGERY Right   . TONSILLECTOMY     FHx:    Reviewed / unchanged  SHx:    Reviewed / unchanged  Systems Review:  Constitutional: Denies fever, chills, wt changes, headaches, insomnia, fatigue, night sweats, change in appetite. Eyes: Denies redness, blurred vision, diplopia, discharge, itchy, watery eyes.  ENT: Denies discharge, congestion, post nasal drip,  epistaxis, sore throat, earache, hearing loss, dental pain, tinnitus, vertigo, sinus pain, snoring.  CV: Denies chest pain, palpitations, irregular heartbeat, syncope, dyspnea, diaphoresis, orthopnea, PND, claudication or edema. Respiratory: denies cough, dyspnea, DOE, pleurisy, hoarseness, laryngitis, wheezing.  Gastrointestinal: Denies dysphagia, odynophagia, heartburn, reflux, water brash, abdominal pain or cramps, nausea, vomiting, bloating, diarrhea, constipation, hematemesis, melena, hematochezia  or hemorrhoids. Genitourinary: Denies dysuria, frequency, urgency, nocturia, hesitancy, discharge, hematuria or flank pain. Musculoskeletal: Denies arthralgias, myalgias, stiffness, jt. swelling, pain, limping or strain/sprain.  Skin: Denies pruritus, rash, hives, warts, acne, eczema or change in skin lesion(s). Neuro: No weakness, tremor, incoordination, spasms, paresthesia or pain. Psychiatric: Denies confusion, memory loss or sensory loss. Endo: Denies change in weight, skin or hair change.  Heme/Lymph: No excessive bleeding, bruising or enlarged lymph nodes.  Physical Exam  BP 132/70   Pulse 72   Temp (!) 97.3 F (36.3 C)   Resp 18   Ht 6' (1.829 m)   Wt 238 lb (108 kg)   BMI 32.28 kg/m   Appears  well nourished, well groomed  and in no distress.  Eyes: PERRLA, EOMs, conjunctiva no swelling or erythema. Sinuses: No frontal/maxillary tenderness ENT/Mouth: EAC's clear, TM's nl w/o erythema, bulging. Nares clear w/o erythema, swelling, exudates. Oropharynx clear without erythema or exudates. Oral hygiene is good. Tongue normal, non obstructing. Hearing intact.  Neck: Supple. Thyroid not palpable. Car 2+/2+ without bruits, nodes or JVD. Chest: Respirations nl with BS clear & equal w/o rales, rhonchi, wheezing or stridor.  Cor: Heart sounds normal w/ regular rate and rhythm without sig. murmurs, gallops, clicks or rubs. Peripheral pulses normal and equal  without edema.  Abdomen: Soft &  bowel sounds normal. Non-tender w/o guarding, rebound, hernias, masses or organomegaly.  Lymphatics: Unremarkable.  Musculoskeletal: Full ROM all peripheral extremities and sl broad-based gait supported with a cane.  Skin: Warm, dry without exposed rashes, lesions or ecchymosis apparent.  Neuro: Cranial nerves intact, reflexes equal bilaterally. Sensory-motor testing grossly intact. Tendon reflexes grossly intact.  Pysch: Alert & oriented x 3.  Insight and judgement nl & appropriate. No ideations.  Assessment and Plan:  1. Essential hypertension  - Continue medication, monitor blood pressure at home.  - Continue DASH diet. Reminder to go to the ER if any CP,  SOB, nausea, dizziness, severe HA, changes vision/speech.  - CBC with Differential/Platelet - BASIC METABOLIC PANEL WITH GFR - Magnesium - TSH  2. Hyperlipidemia, mixed  - Continue diet/meds, exercise,& lifestyle modifications.  - Continue monitor periodic cholesterol/liver & renal functions   - Hepatic function panel - Lipid panel - TSH  3. Abnormal glucose  - Hemoglobin A1c - Insulin, random  4. Vitamin D deficiency  - Continue diet, exercise, lifestyle modifications.  - Monitor appropriate labs. - Continue supplementation.   -  VITAMIN D 25 Hydroxyl  5. Prediabetes  - Hemoglobin A1c - Insulin, random  6. OAB (overactive bladder)   7. Idiopathic gout  - Uric acid  8. Essential tremor   9. Abnormal gait   10. Medication management  - CBC with Differential/Platelet - BASIC METABOLIC PANEL WITH GFR - Hepatic function panel - Magnesium - Lipid panel - TSH - Hemoglobin A1c - Insulin, random - VITAMIN D 25 Hydroxyl - Uric acid  11. OAB  - Patient has sx's of OAB and ur freq . Couldn't afford the $400 co-pay for Myrbetriq and is Rx'd  -Rx - Oxybutynin 5 mg # 270 x 3 Rf - take 1-3 x/ day       Discussed  regular exercise, BP monitoring, weight control to achieve/maintain BMI less than 25  and discussed med and SE's. Recommended labs to assess and monitor clinical status with further disposition pending results of labs. Over 30 minutes of exam, counseling, chart review was performed.

## 2017-10-09 NOTE — Telephone Encounter (Signed)
Rx renewed on 10/06/2017

## 2017-10-10 LAB — CBC WITH DIFFERENTIAL/PLATELET
Basophils Absolute: 29 cells/uL (ref 0–200)
Basophils Relative: 0.7 %
Eosinophils Absolute: 119 cells/uL (ref 15–500)
Eosinophils Relative: 2.9 %
HCT: 49.9 % (ref 38.5–50.0)
Hemoglobin: 17.3 g/dL — ABNORMAL HIGH (ref 13.2–17.1)
Lymphs Abs: 1509 cells/uL (ref 850–3900)
MCH: 33.4 pg — ABNORMAL HIGH (ref 27.0–33.0)
MCHC: 34.7 g/dL (ref 32.0–36.0)
MCV: 96.3 fL (ref 80.0–100.0)
MPV: 9.6 fL (ref 7.5–12.5)
Monocytes Relative: 10.7 %
Neutro Abs: 2005 cells/uL (ref 1500–7800)
Neutrophils Relative %: 48.9 %
Platelets: 150 10*3/uL (ref 140–400)
RBC: 5.18 10*6/uL (ref 4.20–5.80)
RDW: 13 % (ref 11.0–15.0)
Total Lymphocyte: 36.8 %
WBC mixed population: 439 cells/uL (ref 200–950)
WBC: 4.1 10*3/uL (ref 3.8–10.8)

## 2017-10-10 LAB — LIPID PANEL
Cholesterol: 206 mg/dL — ABNORMAL HIGH (ref <200)
HDL: 60 mg/dL (ref >40)
LDL Cholesterol (Calc): 119 mg/dL (calc) — ABNORMAL HIGH
Non-HDL Cholesterol (Calc): 146 mg/dL (calc) — ABNORMAL HIGH (ref <130)
Total CHOL/HDL Ratio: 3.4 (calc) (ref <5.0)
Triglycerides: 155 mg/dL — ABNORMAL HIGH (ref <150)

## 2017-10-10 LAB — INSULIN, RANDOM: Insulin: 11.7 u[IU]/mL (ref 2.0–19.6)

## 2017-10-10 LAB — HEPATIC FUNCTION PANEL
AG Ratio: 1.5 (calc) (ref 1.0–2.5)
ALT: 19 U/L (ref 9–46)
AST: 24 U/L (ref 10–35)
Albumin: 4 g/dL (ref 3.6–5.1)
Alkaline phosphatase (APISO): 56 U/L (ref 40–115)
Bilirubin, Direct: 0.2 mg/dL (ref 0.0–0.2)
Globulin: 2.6 g/dL (calc) (ref 1.9–3.7)
Indirect Bilirubin: 0.8 mg/dL (calc) (ref 0.2–1.2)
Total Bilirubin: 1 mg/dL (ref 0.2–1.2)
Total Protein: 6.6 g/dL (ref 6.1–8.1)

## 2017-10-10 LAB — MAGNESIUM: Magnesium: 1.8 mg/dL (ref 1.5–2.5)

## 2017-10-10 LAB — TSH: TSH: 2.32 mIU/L (ref 0.40–4.50)

## 2017-10-10 LAB — BASIC METABOLIC PANEL WITH GFR
BUN: 20 mg/dL (ref 7–25)
CO2: 32 mmol/L (ref 20–32)
Calcium: 9.9 mg/dL (ref 8.6–10.3)
Chloride: 103 mmol/L (ref 98–110)
Creat: 1.11 mg/dL (ref 0.70–1.18)
GFR, Est African American: 76 mL/min/{1.73_m2} (ref >=60)
GFR, Est Non African American: 66 mL/min/{1.73_m2} (ref >=60)
Glucose, Bld: 100 mg/dL — ABNORMAL HIGH (ref 65–99)
Potassium: 5.4 mmol/L — ABNORMAL HIGH (ref 3.5–5.3)
Sodium: 142 mmol/L (ref 135–146)

## 2017-10-10 LAB — HEMOGLOBIN A1C
Hgb A1c MFr Bld: 5.4 % of total Hgb (ref <5.7)
Mean Plasma Glucose: 108 (calc)
eAG (mmol/L): 6 (calc)

## 2017-10-10 LAB — URIC ACID: Uric Acid, Serum: 4.3 mg/dL (ref 4.0–8.0)

## 2017-10-10 LAB — VITAMIN D 25 HYDROXY (VIT D DEFICIENCY, FRACTURES): Vit D, 25-Hydroxy: 67 ng/mL (ref 30–100)

## 2017-11-10 ENCOUNTER — Other Ambulatory Visit (INDEPENDENT_AMBULATORY_CARE_PROVIDER_SITE_OTHER): Payer: Self-pay | Admitting: Specialist

## 2017-11-10 NOTE — Telephone Encounter (Signed)
Tramadol refill request 

## 2017-11-14 DIAGNOSIS — K760 Fatty (change of) liver, not elsewhere classified: Secondary | ICD-10-CM | POA: Diagnosis not present

## 2017-11-14 DIAGNOSIS — R079 Chest pain, unspecified: Secondary | ICD-10-CM | POA: Diagnosis not present

## 2017-11-14 DIAGNOSIS — Z79899 Other long term (current) drug therapy: Secondary | ICD-10-CM | POA: Diagnosis not present

## 2017-11-14 DIAGNOSIS — E7800 Pure hypercholesterolemia, unspecified: Secondary | ICD-10-CM | POA: Diagnosis not present

## 2017-11-14 DIAGNOSIS — M5136 Other intervertebral disc degeneration, lumbar region: Secondary | ICD-10-CM | POA: Diagnosis not present

## 2017-11-14 DIAGNOSIS — R001 Bradycardia, unspecified: Secondary | ICD-10-CM | POA: Diagnosis not present

## 2017-11-14 DIAGNOSIS — F419 Anxiety disorder, unspecified: Secondary | ICD-10-CM | POA: Insufficient documentation

## 2017-11-14 DIAGNOSIS — I10 Essential (primary) hypertension: Secondary | ICD-10-CM | POA: Diagnosis not present

## 2017-11-14 DIAGNOSIS — R072 Precordial pain: Secondary | ICD-10-CM | POA: Diagnosis not present

## 2017-11-14 DIAGNOSIS — K828 Other specified diseases of gallbladder: Secondary | ICD-10-CM | POA: Diagnosis not present

## 2017-11-14 DIAGNOSIS — E785 Hyperlipidemia, unspecified: Secondary | ICD-10-CM | POA: Diagnosis not present

## 2017-11-14 DIAGNOSIS — M1009 Idiopathic gout, multiple sites: Secondary | ICD-10-CM | POA: Diagnosis not present

## 2017-11-14 DIAGNOSIS — I25110 Atherosclerotic heart disease of native coronary artery with unstable angina pectoris: Secondary | ICD-10-CM | POA: Diagnosis not present

## 2017-11-14 DIAGNOSIS — D696 Thrombocytopenia, unspecified: Secondary | ICD-10-CM | POA: Diagnosis not present

## 2017-11-14 DIAGNOSIS — K2970 Gastritis, unspecified, without bleeding: Secondary | ICD-10-CM | POA: Diagnosis not present

## 2017-11-14 DIAGNOSIS — M1A09X0 Idiopathic chronic gout, multiple sites, without tophus (tophi): Secondary | ICD-10-CM | POA: Diagnosis not present

## 2017-11-14 DIAGNOSIS — R7989 Other specified abnormal findings of blood chemistry: Secondary | ICD-10-CM | POA: Diagnosis not present

## 2017-11-14 DIAGNOSIS — Z791 Long term (current) use of non-steroidal anti-inflammatories (NSAID): Secondary | ICD-10-CM | POA: Diagnosis not present

## 2017-11-15 DIAGNOSIS — I209 Angina pectoris, unspecified: Secondary | ICD-10-CM | POA: Diagnosis not present

## 2017-11-15 DIAGNOSIS — D696 Thrombocytopenia, unspecified: Secondary | ICD-10-CM | POA: Diagnosis present

## 2017-11-15 DIAGNOSIS — E785 Hyperlipidemia, unspecified: Secondary | ICD-10-CM | POA: Diagnosis not present

## 2017-11-15 DIAGNOSIS — M1009 Idiopathic gout, multiple sites: Secondary | ICD-10-CM | POA: Diagnosis present

## 2017-11-15 DIAGNOSIS — F419 Anxiety disorder, unspecified: Secondary | ICD-10-CM | POA: Diagnosis not present

## 2017-11-15 DIAGNOSIS — Z791 Long term (current) use of non-steroidal anti-inflammatories (NSAID): Secondary | ICD-10-CM | POA: Diagnosis not present

## 2017-11-15 DIAGNOSIS — I25110 Atherosclerotic heart disease of native coronary artery with unstable angina pectoris: Secondary | ICD-10-CM | POA: Diagnosis not present

## 2017-11-15 DIAGNOSIS — R9439 Abnormal result of other cardiovascular function study: Secondary | ICD-10-CM | POA: Diagnosis not present

## 2017-11-15 DIAGNOSIS — M1A09X0 Idiopathic chronic gout, multiple sites, without tophus (tophi): Secondary | ICD-10-CM | POA: Diagnosis not present

## 2017-11-15 DIAGNOSIS — M5136 Other intervertebral disc degeneration, lumbar region: Secondary | ICD-10-CM | POA: Diagnosis present

## 2017-11-15 DIAGNOSIS — Z8711 Personal history of peptic ulcer disease: Secondary | ICD-10-CM | POA: Diagnosis not present

## 2017-11-15 DIAGNOSIS — R17 Unspecified jaundice: Secondary | ICD-10-CM | POA: Diagnosis not present

## 2017-11-15 DIAGNOSIS — E782 Mixed hyperlipidemia: Secondary | ICD-10-CM | POA: Diagnosis not present

## 2017-11-15 DIAGNOSIS — E7800 Pure hypercholesterolemia, unspecified: Secondary | ICD-10-CM | POA: Diagnosis present

## 2017-11-15 DIAGNOSIS — R079 Chest pain, unspecified: Secondary | ICD-10-CM | POA: Diagnosis not present

## 2017-11-15 DIAGNOSIS — Z79899 Other long term (current) drug therapy: Secondary | ICD-10-CM | POA: Diagnosis not present

## 2017-11-15 DIAGNOSIS — K922 Gastrointestinal hemorrhage, unspecified: Secondary | ICD-10-CM | POA: Diagnosis not present

## 2017-11-15 DIAGNOSIS — Z8719 Personal history of other diseases of the digestive system: Secondary | ICD-10-CM | POA: Diagnosis not present

## 2017-11-15 DIAGNOSIS — I2510 Atherosclerotic heart disease of native coronary artery without angina pectoris: Secondary | ICD-10-CM | POA: Diagnosis not present

## 2017-11-15 DIAGNOSIS — I10 Essential (primary) hypertension: Secondary | ICD-10-CM | POA: Diagnosis not present

## 2017-11-15 DIAGNOSIS — I200 Unstable angina: Secondary | ICD-10-CM | POA: Diagnosis not present

## 2017-11-15 DIAGNOSIS — K2970 Gastritis, unspecified, without bleeding: Secondary | ICD-10-CM | POA: Diagnosis not present

## 2017-11-15 NOTE — Telephone Encounter (Signed)
[facility]

## 2017-11-18 MED ORDER — ATORVASTATIN CALCIUM 20 MG PO TABS
20.00 | ORAL_TABLET | ORAL | Status: DC
Start: 2017-11-16 — End: 2017-11-18

## 2017-11-18 MED ORDER — GENERIC EXTERNAL MEDICATION
Status: DC
Start: ? — End: 2017-11-18

## 2017-11-18 MED ORDER — ASPIRIN EC 81 MG PO TBEC
81.00 | DELAYED_RELEASE_TABLET | ORAL | Status: DC
Start: 2017-11-16 — End: 2017-11-18

## 2017-11-18 MED ORDER — LOSARTAN POTASSIUM 50 MG PO TABS
50.00 | ORAL_TABLET | ORAL | Status: DC
Start: 2017-11-16 — End: 2017-11-18

## 2017-11-18 MED ORDER — ALPRAZOLAM 0.25 MG PO TABS
.25 | ORAL_TABLET | ORAL | Status: DC
Start: ? — End: 2017-11-18

## 2017-11-18 MED ORDER — NITROGLYCERIN 0.4 MG SL SUBL
.40 | SUBLINGUAL_TABLET | SUBLINGUAL | Status: DC
Start: ? — End: 2017-11-18

## 2017-11-18 MED ORDER — PANTOPRAZOLE SODIUM 40 MG PO TBEC
40.00 | DELAYED_RELEASE_TABLET | ORAL | Status: DC
Start: 2017-11-16 — End: 2017-11-18

## 2017-11-18 MED ORDER — SODIUM CHLORIDE 0.9 % IV SOLN
INTRAVENOUS | Status: DC
Start: ? — End: 2017-11-18

## 2017-11-18 MED ORDER — MORPHINE SULFATE (PF) 4 MG/ML IV SOLN
2.00 | INTRAVENOUS | Status: DC
Start: ? — End: 2017-11-18

## 2017-11-18 MED ORDER — DIAZEPAM 5 MG PO TABS
5.00 | ORAL_TABLET | ORAL | Status: DC
Start: ? — End: 2017-11-18

## 2017-11-18 MED ORDER — ALLOPURINOL 100 MG PO TABS
100.00 | ORAL_TABLET | ORAL | Status: DC
Start: 2017-11-16 — End: 2017-11-18

## 2017-11-18 MED ORDER — SENNA-DOCUSATE SODIUM 8.6-50 MG PO TABS
ORAL_TABLET | ORAL | Status: DC
Start: ? — End: 2017-11-18

## 2017-11-20 DIAGNOSIS — I2510 Atherosclerotic heart disease of native coronary artery without angina pectoris: Secondary | ICD-10-CM | POA: Insufficient documentation

## 2017-11-21 DIAGNOSIS — Z955 Presence of coronary angioplasty implant and graft: Secondary | ICD-10-CM | POA: Insufficient documentation

## 2017-11-23 MED ORDER — ASPIRIN EC 81 MG PO TBEC
81.00 | DELAYED_RELEASE_TABLET | ORAL | Status: DC
Start: 2017-11-22 — End: 2017-11-23

## 2017-11-23 MED ORDER — NITROGLYCERIN 0.4 MG SL SUBL
.40 | SUBLINGUAL_TABLET | SUBLINGUAL | Status: DC
Start: ? — End: 2017-11-23

## 2017-11-23 MED ORDER — DIAZEPAM 5 MG PO TABS
5.00 | ORAL_TABLET | ORAL | Status: DC
Start: ? — End: 2017-11-23

## 2017-11-23 MED ORDER — GENERIC EXTERNAL MEDICATION
Status: DC
Start: ? — End: 2017-11-23

## 2017-11-23 MED ORDER — SODIUM CHLORIDE 0.9 % IV SOLN
INTRAVENOUS | Status: DC
Start: ? — End: 2017-11-23

## 2017-11-23 MED ORDER — ANTACID & ANTIGAS 200-200-20 MG/5ML PO SUSP
30.00 | ORAL | Status: DC
Start: ? — End: 2017-11-23

## 2017-11-23 MED ORDER — ATORVASTATIN CALCIUM 20 MG PO TABS
20.00 | ORAL_TABLET | ORAL | Status: DC
Start: 2017-11-21 — End: 2017-11-23

## 2017-11-23 MED ORDER — MAGNESIUM HYDROXIDE 400 MG/5ML PO SUSP
30.00 | ORAL | Status: DC
Start: ? — End: 2017-11-23

## 2017-11-23 MED ORDER — ALPRAZOLAM 0.25 MG PO TABS
.25 | ORAL_TABLET | ORAL | Status: DC
Start: ? — End: 2017-11-23

## 2017-11-23 MED ORDER — TICAGRELOR 90 MG PO TABS
90.00 | ORAL_TABLET | ORAL | Status: DC
Start: 2017-11-21 — End: 2017-11-23

## 2017-11-23 MED ORDER — IOPAMIDOL (ISOVUE-370) INJECTION 76%
INTRAVENOUS | Status: DC
Start: ? — End: 2017-11-23

## 2017-11-23 MED ORDER — PANTOPRAZOLE SODIUM 40 MG PO TBEC
40.00 | DELAYED_RELEASE_TABLET | ORAL | Status: DC
Start: 2017-11-22 — End: 2017-11-23

## 2017-11-23 MED ORDER — LOSARTAN POTASSIUM 100 MG PO TABS
100.00 | ORAL_TABLET | ORAL | Status: DC
Start: 2017-11-22 — End: 2017-11-23

## 2017-11-23 MED ORDER — METOPROLOL SUCCINATE ER 25 MG PO TB24
25.00 | ORAL_TABLET | ORAL | Status: DC
Start: 2017-11-22 — End: 2017-11-23

## 2017-11-23 MED ORDER — ZOLPIDEM TARTRATE 5 MG PO TABS
5.00 | ORAL_TABLET | ORAL | Status: DC
Start: ? — End: 2017-11-23

## 2017-11-23 MED ORDER — ALLOPURINOL 100 MG PO TABS
100.00 | ORAL_TABLET | ORAL | Status: DC
Start: 2017-11-22 — End: 2017-11-23

## 2017-11-23 MED ORDER — OXYBUTYNIN CHLORIDE 5 MG PO TABS
5.00 | ORAL_TABLET | ORAL | Status: DC
Start: 2017-11-21 — End: 2017-11-23

## 2017-11-23 MED ORDER — SENNA-DOCUSATE SODIUM 8.6-50 MG PO TABS
ORAL_TABLET | ORAL | Status: DC
Start: ? — End: 2017-11-23

## 2017-11-23 MED ORDER — MORPHINE SULFATE (PF) 4 MG/ML IV SOLN
2.00 | INTRAVENOUS | Status: DC
Start: ? — End: 2017-11-23

## 2017-11-23 MED ORDER — METOCLOPRAMIDE HCL 5 MG/ML IJ SOLN
10.00 | INTRAMUSCULAR | Status: DC
Start: ? — End: 2017-11-23

## 2017-12-07 ENCOUNTER — Other Ambulatory Visit (INDEPENDENT_AMBULATORY_CARE_PROVIDER_SITE_OTHER): Payer: Self-pay | Admitting: Specialist

## 2017-12-07 NOTE — Telephone Encounter (Signed)
Omeprazole refill request.

## 2017-12-14 DIAGNOSIS — I10 Essential (primary) hypertension: Secondary | ICD-10-CM | POA: Diagnosis not present

## 2017-12-14 DIAGNOSIS — Z8719 Personal history of other diseases of the digestive system: Secondary | ICD-10-CM | POA: Diagnosis not present

## 2017-12-14 DIAGNOSIS — Z955 Presence of coronary angioplasty implant and graft: Secondary | ICD-10-CM | POA: Diagnosis not present

## 2017-12-14 DIAGNOSIS — I2510 Atherosclerotic heart disease of native coronary artery without angina pectoris: Secondary | ICD-10-CM | POA: Diagnosis not present

## 2017-12-14 DIAGNOSIS — E785 Hyperlipidemia, unspecified: Secondary | ICD-10-CM | POA: Diagnosis not present

## 2017-12-22 ENCOUNTER — Other Ambulatory Visit: Payer: Self-pay | Admitting: Internal Medicine

## 2017-12-22 DIAGNOSIS — M1000 Idiopathic gout, unspecified site: Secondary | ICD-10-CM

## 2018-01-04 ENCOUNTER — Other Ambulatory Visit (INDEPENDENT_AMBULATORY_CARE_PROVIDER_SITE_OTHER): Payer: Self-pay | Admitting: Specialist

## 2018-01-05 NOTE — Telephone Encounter (Signed)
Omeprazole refill request.

## 2018-01-11 ENCOUNTER — Encounter: Payer: Self-pay | Admitting: Adult Health

## 2018-01-11 DIAGNOSIS — N3281 Overactive bladder: Secondary | ICD-10-CM | POA: Insufficient documentation

## 2018-01-11 NOTE — Progress Notes (Signed)
MEDICARE ANNUAL WELLNESS VISIT AND FOLLOW UP Assessment:   Diagnoses and all orders for this visit:  Encounter for Medicare annual wellness exam  Sinus pause   With blocked PACs, PVCs and sinus arrhythmia Rate and rhythm regular today, continue BB, followed by cardiology  Essential hypertension Continue medications Monitor blood pressure at home; call if consistently over 130/80 Continue DASH diet.   Reminder to go to the ER if any CP, SOB, nausea, dizziness, severe HA, changes vision/speech, left arm numbness and tingling and jaw pain.  Essential tremor Monitor  Vitamin D deficiency At goal at recent check; continue to recommend supplementation for goal of 70-100 Defer vitamin D level  Other abnormal glucose Recent A1Cs at goal Discussed diet/exercise, weight management  Defer A1C; check BMP  Overweight (BMI 25.0-29.9) Long discussion about weight loss, diet, and exercise Recommended diet heavy in fruits and veggies and low in animal meats, cheeses, and dairy products, appropriate calorie intake Discussed appropriate weight for height  Continue heart healthy diet, daily exercise Follow up at next visit  Medication management CBC, CMP/GFR  Hyperlipidemia Continue medications Continue low cholesterol diet and exercise.  Check lipid panel.   Idiopathic gout, unspecified chronicity, unspecified site Continue allopurinol Diet discussed Check uric acid as needed  Proteinuria, unspecified type Followed by Dr. Kathrene Bongo  Overactive bladder Oxybutynin not particularly helpful, has tried myrbetriq in the past but was expensive Will try again and d/c oxybutynin if affordable/effective  Coronary artery disease involving native coronary artery of native heart, angina presence unspecified Control blood pressure, cholesterol, glucose, increase exercise.  Followed by cardiology Nitroglycerine if needed  S/P coronary artery stent placement x1 (2019) Continue BB,  statin, ASA, brillinta Followed by cardiology   Future Appointments  Date Time Provider Department Center  04/30/2018  3:00 PM Lucky Cowboy, MD [facility] None     Plan:   During the course of the visit the patient was educated and counseled about appropriate screening and preventive services including:    Pneumococcal vaccine   Hepatitis B vaccine  Td vaccine  Screening electrocardiogram  Colorectal cancer screening  Diabetes screening  Glaucoma screening  Nutrition counseling    Subjective:  Leroy Griffin is a 72 y.o. male who presents accompanied for Medicare Annual Wellness Visit and 3 month follow up for HTN, hyperlipidemia, prediabetes, and vitamin D Def. Patient has also been evaluated by Dr Tat for Tremor - presumed Hereditary or Essential and she ruled out Parkinson's. Because of gait difficulty, he also had high volume LP w/o any improvement in his gait. He is also now followed by Dr. Otelia Sergeant for back pain and bilateral knee pain. He has hx of ankylosing spondylitis with bone spurs remotely, discussed surgery but ultimately declined.   Patient had negative screening stress test and Cardiac MRI in Feb 2017 per Dr Graciela Husbands, however demonstrated unstable angina recently in 11/2017 and underwent diagnostic heart cath by Dr. Satira Sark. Cair which demonstrated high-grade stenosis of distal LAD (85-90%) and diagonal (90%) - stent was placed to LAD with balloon inflation of diagonal with improvement to 50%. He has follow up with Dr. Neale Burly and Dr. Graciela Husbands.   Has urinary frequency, nocturia x 4-5, very rare incontinence if he waits too long. Occ hesitancy, dribbling, weak stream. - was prescribed oxybutynin, but still waking up 4-5 times at night, unsure if helpful.   BMI is Body mass index is 29.43 kg/m., he has been working on diet and exercise. Wife is strictly watching his diet for heart healthy  diet and portion control.  Wt Readings from Last 3 Encounters:  01/12/18 217 lb (98.4  kg)  10/09/17 238 lb (108 kg)  08/10/17 238 lb (108 kg)   His blood pressure has been controlled at home, today their BP is BP: 110/64 He denies chest pain, shortness of breath, dizziness. BP: 110/64 He is on cholesterol medication (atorvastatin 40 mg daily) and denies myalgias. His cholesterol is at goal. The cholesterol last visit was:   Lab Results  Component Value Date   CHOL 206 (H) 10/09/2017   HDL 60 10/09/2017   LDLCALC 119 (H) 10/09/2017   TRIG 155 (H) 10/09/2017   CHOLHDL 3.4 10/09/2017   He has been working on diet and exercise for glucose management, and denies paresthesia of the feet, polydipsia and polyuria. Last A1C in the office was:  Lab Results  Component Value Date   HGBA1C 5.4 10/09/2017   Patient also has Idiopathic proteinuria & CKD2 followed by Dr Julien GirtKelli [place].  Lab Results  Component Value Date   GFRNONAA 66 10/09/2017   Patient is on Vitamin D supplement.   Lab Results  Component Value Date   VD25OH 67 10/09/2017       Medication Review: Current Outpatient Medications on File Prior to Visit  Medication Sig Dispense Refill  . allopurinol (ZYLOPRIM) 300 MG tablet TAKE 1/2 TO 1 TABLET BY MOUTH DAILY AS DIRECTED FOR GOUT 90 tablet 0  . aspirin 81 MG EC tablet Take 1 tablet by mouth daily.    Marland Kitchen. atorvastatin (LIPITOR) 20 MG tablet TAKE 1/2 TO 1 TABLET BY MOUTH EVERY DAY FOR CHOLESTEROL (Patient taking differently: Take 40mg  by mouth daily) 90 tablet 1  . Cholecalciferol (VITAMIN D) 2000 units tablet Take 10,000 Units by mouth every morning.     . diazepam (VALIUM) 5 MG tablet Take 0.5 tablets by mouth daily as needed.    . diclofenac sodium (VOLTAREN) 1 % GEL Apply 4 g topically 4 (four) times daily. 5 Tube 3  . losartan (COZAAR) 100 MG tablet TAKE 1/2 TABLET BY MOUTH TWICE DAILY 90 tablet 1  . MAGNESIUM PO Take 500 mg by mouth 2 (two) times daily.     . metoprolol succinate (TOPROL-XL) 25 MG 24 hr tablet Take 1 tablet by mouth daily.    .  Minoxidil (ROGAINE MENS) 5 % FOAM Apply 1 application topically daily.    . nitroGLYCERIN (NITROSTAT) 0.4 MG SL tablet Place 1 tablet under the tongue every 5 (five) minutes as needed. For chest pain    . Omega-3 Fatty Acids (FISH OIL) 1200 MG CPDR Take 2 capsules by mouth at bedtime.     Marland Kitchen. omeprazole (PRILOSEC) 20 MG capsule TAKE 1 CAPSULE BY MOUTH DAILY 90 capsule 0  . oxybutynin (DITROPAN) 5 MG tablet Take 1 tablet 3 x/ day for Bladder Control 270 tablet 1  . ticagrelor (BRILINTA) 90 MG TABS tablet Take 1 tablet by mouth 2 (two) times daily.    . traMADol (ULTRAM) 50 MG tablet TAKE 1 TABLET BY MOUTH EVERY 6 HOURS AS NEEDED 30 tablet 0  . triamcinolone ointment (KENALOG) 0.1 % Apply 1 application topically 3 (three) times daily. (Patient taking differently: Apply 1 application topically 2 (two) times daily as needed. ) 80 g 3  . vitamin B-12 (CYANOCOBALAMIN) 1000 MCG tablet Take 1,000 mcg by mouth daily.    . Zinc 50 MG TABS Take 1 tablet by mouth daily.    Marland Kitchen. zinc gluconate 50 MG tablet Take 50 mg  by mouth daily.    Marland Kitchen acetaminophen-codeine (TYLENOL #3) 300-30 MG tablet Take 1 tablet by mouth every 4 (four) hours as needed. For pain    . Flaxseed, Linseed, (FLAX SEED OIL) 1000 MG CAPS Take 1,000 mg by mouth every evening.      No current facility-administered medications on file prior to visit.     Current Problems (verified) Patient Active Problem List   Diagnosis Date Noted  . Overactive bladder 01/11/2018  . S/P drug eluting coronary stent placement 11/21/2017  . CAD in native artery 11/20/2017  . Anxiety 11/14/2017  . Skin ulcer of second toe, right, limited to breakdown of skin (HCC) 09/09/2016  . Obesity (BMI 30.0-34.9) 08/26/2014  . Sinus pause   With blocked PACs, PVCs and sinus arrhythmia 10/10/2013  . Medication management 10/06/2013  . Hyperlipidemia 08/27/2013  . Essential tremor   . Proteinuria   . Prediabetes   . Gout   . Vitamin D deficiency   . Essential  hypertension 10/10/2010    Screening Tests Immunization History  Administered Date(s) Administered  . Influenza Split 05/23/2013  . Influenza, High Dose Seasonal PF 05/14/2014, 06/11/2015, 05/19/2016  . Pneumococcal Conjugate-13 12/03/2014  . Pneumococcal Polysaccharide-23 06/01/2010  . Td 06/01/2010  . Tdap 01/28/2014  . Zoster 08/08/2004    Preventative care: Last colonoscopy: 2013 due 10 yeras EGD: 2019 Echo 08/2015 Stress test 09/2015 Cardiac cath 11/2017  Prior vaccinations: TD or Tdap: 2015 Influenza: 2018 Pneumococcal: 2011 Prevnar 2016 Shingles/Zostavax: 2006   Names of Other Physician/Practitioners you currently use: 1. [facility] here for primary care 2. Dr. Irven Shelling eye doctor, last visit July 2018, has upcoming appointment 3. Dr. Pollyann Kennedy , dentist, last visit q 3 months  Patient Care Team: Lucky Cowboy, MD as PCP - General (Internal Medicine) Sharyn Lull, Elvin So, MD as Referring Physician (Dermatology) Mittie Bodo, MD as Referring Physician (Cardiology) Annie Sable, MD as Consulting Physician (Nephrology) Marney Setting, MD as Referring Physician (Gastroenterology)   Allergies Allergies  Allergen Reactions  . Enalapril Rash  . Aspirin     Internal bleeding  . Nsaids     Internal bleeding  . Welchol [Colesevelam Hcl] Diarrhea  . Vasotec [Enalaprilat] Rash    SURGICAL HISTORY He  has a past surgical history that includes Knee surgery (Right); Tonsillectomy; and Cyst excision. FAMILY HISTORY His family history includes Dementia in his mother; Emphysema in his brother; Heart disease in his brother; Stroke in his father. SOCIAL HISTORY He  reports that he has never smoked. He has never used smokeless tobacco. He reports that he drinks about 7.0 oz of alcohol per week. He reports that he does not use drugs. He has a new grandson Mosetta Putt, 76 year old. DAUGHTER  ERICA  MEDICARE WELLNESS OBJECTIVES: Physical activity:   Cardiac risk factors:   Depression/mood screen:   Depression screen [facility] 2/9 01/12/2018  Decreased Interest 0  Down, Depressed, Hopeless 0  PHQ - 2 Score 0    ADLs:  In your present state of health, do you have any difficulty performing the following activities: 10/09/2017 07/05/2017  Hearing? N N  Vision? N N  Difficulty concentrating or making decisions? N N  Walking or climbing stairs? N Y  Dressing or bathing? N N  Doing errands, shopping? N N  Some recent data might be hidden     Cognitive Testing  Alert? Yes  Normal Appearance?Yes  Oriented to person? Yes  Place? Yes   Time?  Yes  Recall of three objects?  Yes  Can perform simple calculations? Yes  Displays appropriate judgment?Yes  Can read the correct time from a watch face?Yes  EOL planning: Does Patient Have a Medical Advance Directive?: Yes Type of Advance Directive: Healthcare Power of Attorney, Living will Does patient want to make changes to medical advance directive?: No - Patient declined Copy of Healthcare Power of Attorney in Chart?: No - copy requested    Objective:   Blood pressure 110/64, pulse (!) 56, temperature 97.7 F (36.5 C), height 6' (1.829 m), weight 217 lb (98.4 kg), SpO2 98 %. Body mass index is 29.43 kg/m.  General appearance: alert, no distress, WD/WN, male, Mask facies HEENT: normocephalic, sclerae anicteric, TMs pearly, nares patent, no discharge or erythema, pharynx normal Oral cavity: MMM, no lesions Neck: supple, no lymphadenopathy, no thyromegaly, no masses Heart: RRR, normal S1, S2, no murmurs Lungs: CTA bilaterally, no wheezes, rhonchi, or rales Abdomen: +bs, soft, non tender, non distended, no masses, no hepatomegaly, no splenomegaly Musculoskeletal: nontender, no swelling, no obvious deformity Extremities: no edema, no cyanosis, no clubbing Pulses: 2+ symmetric, upper and lower extremities, normal cap  refill Neurological: alert, oriented x 3, CN2-12 intact, strength normal upper extremities and lower extremities, cogwheel rigidity present in bilateral upper extremities, mild tremor of the right hand present, sensation normal throughout, DTRs 2+ throughout, no cerebellar signs, shuffling bent over gait with cane.  Psychiatric: normal affect, behavior normal, pleasant   Medicare Attestation I have personally reviewed: The patient's medical and social history Their use of alcohol, tobacco or illicit drugs Their current medications and supplements The patient's functional ability including ADLs,fall risks, home safety risks, cognitive, and hearing and visual impairment Diet and physical activities Evidence for depression or mood disorders  The patient's weight, height, BMI, and visual acuity have been recorded in the chart.  I have made referrals, counseling, and provided education to the patient based on review of the above and I have provided the patient with a written personalized care plan for preventive services.     Dan Maker, NP   01/12/2018

## 2018-01-12 ENCOUNTER — Encounter: Payer: Self-pay | Admitting: Adult Health

## 2018-01-12 ENCOUNTER — Ambulatory Visit (INDEPENDENT_AMBULATORY_CARE_PROVIDER_SITE_OTHER): Payer: Medicare Other | Admitting: Adult Health

## 2018-01-12 VITALS — BP 110/64 | HR 56 | Temp 97.7°F | Ht 72.0 in | Wt 217.0 lb

## 2018-01-12 DIAGNOSIS — Z79899 Other long term (current) drug therapy: Secondary | ICD-10-CM

## 2018-01-12 DIAGNOSIS — R809 Proteinuria, unspecified: Secondary | ICD-10-CM

## 2018-01-12 DIAGNOSIS — N3281 Overactive bladder: Secondary | ICD-10-CM

## 2018-01-12 DIAGNOSIS — E559 Vitamin D deficiency, unspecified: Secondary | ICD-10-CM

## 2018-01-12 DIAGNOSIS — I10 Essential (primary) hypertension: Secondary | ICD-10-CM | POA: Diagnosis not present

## 2018-01-12 DIAGNOSIS — I2510 Atherosclerotic heart disease of native coronary artery without angina pectoris: Secondary | ICD-10-CM | POA: Diagnosis not present

## 2018-01-12 DIAGNOSIS — R7309 Other abnormal glucose: Secondary | ICD-10-CM

## 2018-01-12 DIAGNOSIS — R7303 Prediabetes: Secondary | ICD-10-CM

## 2018-01-12 DIAGNOSIS — M1000 Idiopathic gout, unspecified site: Secondary | ICD-10-CM

## 2018-01-12 DIAGNOSIS — R6889 Other general symptoms and signs: Secondary | ICD-10-CM

## 2018-01-12 DIAGNOSIS — L97511 Non-pressure chronic ulcer of other part of right foot limited to breakdown of skin: Secondary | ICD-10-CM

## 2018-01-12 DIAGNOSIS — E663 Overweight: Secondary | ICD-10-CM

## 2018-01-12 DIAGNOSIS — E782 Mixed hyperlipidemia: Secondary | ICD-10-CM | POA: Diagnosis not present

## 2018-01-12 DIAGNOSIS — Z0001 Encounter for general adult medical examination with abnormal findings: Secondary | ICD-10-CM

## 2018-01-12 DIAGNOSIS — Z955 Presence of coronary angioplasty implant and graft: Secondary | ICD-10-CM | POA: Diagnosis not present

## 2018-01-12 DIAGNOSIS — I455 Other specified heart block: Secondary | ICD-10-CM

## 2018-01-12 DIAGNOSIS — G250 Essential tremor: Secondary | ICD-10-CM

## 2018-01-12 DIAGNOSIS — Z0000 Encounter for general adult medical examination without abnormal findings: Secondary | ICD-10-CM

## 2018-01-12 MED ORDER — ATORVASTATIN CALCIUM 40 MG PO TABS
ORAL_TABLET | ORAL | 1 refills | Status: DC
Start: 2018-01-12 — End: 2018-07-11

## 2018-01-12 MED ORDER — MIRABEGRON ER 50 MG PO TB24: 50.0000 mg | ORAL_TABLET | Freq: Every day | ORAL | 5 refills | Status: DC

## 2018-01-12 NOTE — Patient Instructions (Signed)
Aim for 7+ servings of fruits and vegetables daily  80+ fluid ounces of water or unsweet tea for healthy kidneys  Limit alcohol intake, avoid smoking  Limit animal fats in diet for cholesterol and heart health - choose grass fed whenever available  Aim for low stress - take time to unwind and care for your mental health  Aim for 150 min of moderate intensity exercise weekly for heart health, and weights twice weekly for bone health  Aim for 7-9 hours of sleep daily    Heart-Healthy Eating Plan Heart-healthy meal planning includes:  Limiting unhealthy fats.  Increasing healthy fats.  Making other small dietary changes.  You may need to talk with your doctor or a diet specialist (dietitian) to create an eating plan that is right for you. What types of fat should I choose?  Choose healthy fats. These include olive oil and canola oil, flaxseeds, walnuts, almonds, and seeds.  Eat more omega-3 fats. These include salmon, mackerel, sardines, tuna, flaxseed oil, and ground flaxseeds. Try to eat fish at least twice each week.  Limit saturated fats. ? Saturated fats are often found in animal products, such as meats, butter, and cream. ? Plant sources of saturated fats include palm oil, palm kernel oil, and coconut oil.  Avoid foods with partially hydrogenated oils in them. These include stick margarine, some tub margarines, cookies, crackers, and other baked goods. These contain trans fats. What general guidelines do I need to follow?  Check food labels carefully. Identify foods with trans fats or high amounts of saturated fat.  Fill one half of your plate with vegetables and green salads. Eat 4-5 servings of vegetables per day. A serving of vegetables is: ? 1 cup of raw leafy vegetables. ?  cup of raw or cooked cut-up vegetables. ?  cup of vegetable juice.  Fill one fourth of your plate with whole grains. Look for the word "whole" as the first word in the ingredient  list.  Fill one fourth of your plate with lean protein foods.  Eat 4-5 servings of fruit per day. A serving of fruit is: ? One medium whole fruit. ?  cup of dried fruit. ?  cup of fresh, frozen, or canned fruit. ?  cup of 100% fruit juice.  Eat more foods that contain soluble fiber. These include apples, broccoli, carrots, beans, peas, and barley. Try to get 20-30 g of fiber per day.  Eat more home-cooked food. Eat less restaurant, buffet, and fast food.  Limit or avoid alcohol.  Limit foods high in starch and sugar.  Avoid fried foods.  Avoid frying your food. Try baking, boiling, grilling, or broiling it instead. You can also reduce fat by: ? Removing the skin from poultry. ? Removing all visible fats from meats. ? Skimming the fat off of stews, soups, and gravies before serving them. ? Steaming vegetables in water or broth.  Lose weight if you are overweight.  Eat 4-5 servings of nuts, legumes, and seeds per week: ? One serving of dried beans or legumes equals  cup after being cooked. ? One serving of nuts equals 1 ounces. ? One serving of seeds equals  ounce or one tablespoon.  You may need to keep track of how much salt or sodium you eat. This is especially true if you have high blood pressure. Talk with your doctor or dietitian to get more information. What foods can I eat? Grains Breads, including [place], white, pita, wheat, raisin, rye, oatmeal, and [place]. Tortillas  that are neither fried nor made with lard or trans fat. Low-fat rolls, including hotdog and hamburger buns and English muffins. Biscuits. Muffins. Waffles. Pancakes. Light popcorn. Whole-grain cereals. Flatbread. Melba toast. Pretzels. Breadsticks. Rusks. Low-fat snacks. Low-fat crackers, including oyster, saltine, matzo, graham, animal, and rye. Rice and pasta, including brown rice and pastas that are made with whole wheat. Vegetables All vegetables. Fruits All fruits, but limit coconut. Meats and  Other Protein Sources Lean, well-trimmed beef, veal, pork, and lamb. Chicken and [place] without skin. All fish and shellfish. Wild duck, rabbit, pheasant, and venison. Egg whites or low-cholesterol egg substitutes. Dried beans, peas, lentils, and tofu. Seeds and most nuts. Dairy Low-fat or nonfat cheeses, including ricotta, string, and mozzarella. Skim or 1% milk that is liquid, powdered, or evaporated. Buttermilk that is made with low-fat milk. Nonfat or low-fat yogurt. Beverages Mineral water. Diet carbonated beverages. Sweets and Desserts Sherbets and fruit ices. Honey, jam, marmalade, jelly, and syrups. Meringues and gelatins. Pure sugar candy, such as hard candy, jelly beans, gumdrops, mints, marshmallows, and small amounts of dark chocolate. [employer] MIR[employer] food cake. Eat all sweets and desserts in moderation. Fats and Oils Nonhydrogenated (trans-free) margarines. Vegetable oils, including soybean, sesame, sunflower, olive, peanut, safflower, corn, canola, and cottonseed. Salad dressings or mayonnaise made with a vegetable oil. Limit added fats and oils that you use for cooking, baking, salads, and as spreads. Other Cocoa powder. Coffee and tea. All seasonings and condiments. The items listed above may not be a complete list of recommended foods or beverages. Contact your dietitian for more options. What foods are not recommended? Grains Breads that are made with saturated or trans fats, oils, or whole milk. Croissants. Butter rolls. Cheese breads. Sweet rolls. Donuts. Buttered popcorn. Chow mein noodles. High-fat crackers, such as cheese or butter crackers. Meats and Other Protein Sources Fatty meats, such as hotdogs, short ribs, sausage, spareribs, bacon, rib eye roast or steak, and mutton. High-fat deli meats, such as salami and bologna. Caviar. Domestic duck and goose. Organ meats, such as kidney, liver, sweetbreads, and heart. Dairy Cream, sour cream, cream cheese, and creamed cottage cheese.  Whole-milk cheeses, including blue (bleu), [address] [place], [place], [place], [address], [place], [address], cheddar, [place], and [place]. Whole or 2% milk that is liquid, evaporated, or condensed. Whole buttermilk. Cream sauce or high-fat cheese sauce. Yogurt that is made from whole milk. Beverages Regular sodas and juice drinks with added sugar. Sweets and Desserts Frosting. Pudding. Cookies. Cakes other than angel food cake. Candy that has milk chocolate or white chocolate, hydrogenated fat, butter, coconut, or unknown ingredients. Buttered syrups. Full-fat ice cream or ice cream drinks. Fats and Oils Gravy that has suet, meat fat, or shortening. Cocoa butter, hydrogenated oils, palm oil, coconut oil, palm kernel oil. These can often be found in baked products, candy, fried foods, nondairy creamers, and whipped toppings. Solid fats and shortenings, including bacon fat, salt pork, lard, and butter. Nondairy cream substitutes, such as coffee creamers and sour cream substitutes. Salad dressings that are made of unknown oils, cheese, or sour cream. The items listed above may not be a complete list of foods and beverages to avoid. Contact your dietitian for more information. This information is not intended to replace advice given to you by your health care provider. Make sure you discuss any questions you have with your health care provider. Document Released: 01/24/2012 Document Revised: 12/31/2015 Document Reviewed: 01/16/2014 Elsevier Interactive Patient Education  2018 Elsevier Inc.    Mirabegron extended-release tablets What is this  medicine? MIRABEGRON (MIR a BEG ron) is used to treat overactive bladder. This medicine reduces the amount of bathroom visits. It may also help to control wetting accidents. This medicine may be used for other purposes; ask your health care provider or pharmacist if you have questions. COMMON BRAND NAME(S): Myrbetriq What should I tell my health care provider before I  take this medicine? They need to know if you have any of these conditions: -difficulty passing urine -high blood pressure -kidney disease -liver disease -an unusual or allergic reaction to mirabegron, other medicines, foods, dyes, or preservatives -pregnant or trying to get pregnant -breast-feeding How should I use this medicine? Take this medicine by mouth with a glass of water. Follow the directions on the prescription label. Do not cut, crush or chew this medicine. You can take it with or without food. If it upsets your stomach, take it with food. Take your medicine at regular intervals. Do not take it more often than directed. Do not stop taking except on your doctor's advice. Talk to your pediatrician regarding the use of this medicine in children. Special care may be needed. Overdosage: If you think you have taken too much of this medicine contact a poison control center or emergency room at once. NOTE: This medicine is only for you. Do not share this medicine with others. What if I miss a dose? If you miss a dose, take it as soon as you can. If it is almost time for your next dose, take only that dose. Do not take double or extra doses. What may interact with this medicine? -certain medicines for bladder problems like fesoterodine, oxybutynin, solifenacin, tolterodine -desipramine -digoxin -flecainide -ketoconazole -MAOIs like Carbex, Eldepryl, Marplan, Nardil, and Parnate -metoprolol -propafenone -thioridazine -warfarin This list may not describe all possible interactions. Give your health care provider a list of all the medicines, herbs, non-prescription drugs, or dietary supplements you use. Also tell them if you smoke, drink alcohol, or use illegal drugs. Some items may interact with your medicine. What should I watch for while using this medicine? It may take 8 weeks to notice the full benefit from this medicine. You may need to limit your intake tea, coffee, caffeinated  sodas, and alcohol. These drinks may make your symptoms worse. Visit your doctor or health care professional for regular checks on your progress. Check your blood pressure as directed. Ask your doctor or health care professional what your blood pressure should be and when you should contact him or her. What side effects may I notice from receiving this medicine? Side effects that you should report to your doctor or health care professional as soon as possible: -allergic reactions like skin rash, itching or hives, swelling of the face, lips, or tongue -chest pain or palpitations -severe or sudden headache -high blood pressure -fast, irregular heartbeat -redness, blistering, peeling or loosening of the skin, including inside the mouth -signs of infection like fever or chills; cough; sore throat; pain or difficulty passing urine -trouble passing urine or change in the amount of urine Side effects that usually do not require medical attention (report to your doctor or health care professional if they continue or are bothersome): -constipation -diarrhea -dizziness -dry eyes -joint pain -mild headache -nausea -runny nose This list may not describe all possible side effects. Call your doctor for medical advice about side effects. You may report side effects to FDA at 1-800-FDA-1088. Where should I keep my medicine? Keep out of the reach of children. Store at room  temperature between 15 and 30 degrees C (59 and 86 degrees F). Throw away any unused medicine after the expiration date. NOTE: This sheet is a summary. It may not cover all possible information. If you have questions about this medicine, talk to your doctor, pharmacist, or health care provider.  2018 Elsevier/Gold Standard (2015-08-27 12:14:30)

## 2018-01-13 LAB — LIPID PANEL
Cholesterol: 140 mg/dL (ref <200)
HDL: 34 mg/dL — ABNORMAL LOW (ref >40)
LDL Cholesterol (Calc): 85 mg/dL (calc)
Non-HDL Cholesterol (Calc): 106 mg/dL (calc) (ref <130)
Total CHOL/HDL Ratio: 4.1 (calc) (ref <5.0)
Triglycerides: 109 mg/dL (ref <150)

## 2018-01-13 LAB — COMPLETE METABOLIC PANEL WITH GFR
AG Ratio: 1.6 (calc) (ref 1.0–2.5)
ALT: 17 U/L (ref 9–46)
AST: 21 U/L (ref 10–35)
Albumin: 4.2 g/dL (ref 3.6–5.1)
Alkaline phosphatase (APISO): 78 U/L (ref 40–115)
BUN: 21 mg/dL (ref 7–25)
CO2: 28 mmol/L (ref 20–32)
Calcium: 10 mg/dL (ref 8.6–10.3)
Chloride: 103 mmol/L (ref 98–110)
Creat: 1.13 mg/dL (ref 0.70–1.18)
GFR, Est African American: 74 mL/min/{1.73_m2} (ref >=60)
GFR, Est Non African American: 64 mL/min/{1.73_m2} (ref >=60)
Globulin: 2.6 g/dL (calc) (ref 1.9–3.7)
Glucose, Bld: 85 mg/dL (ref 65–99)
Potassium: 5.1 mmol/L (ref 3.5–5.3)
Sodium: 138 mmol/L (ref 135–146)
Total Bilirubin: 1.2 mg/dL (ref 0.2–1.2)
Total Protein: 6.8 g/dL (ref 6.1–8.1)

## 2018-01-13 LAB — CBC WITH DIFFERENTIAL/PLATELET
Basophils Absolute: 28 cells/uL (ref 0–200)
Basophils Relative: 0.5 %
Eosinophils Absolute: 179 cells/uL (ref 15–500)
Eosinophils Relative: 3.2 %
HCT: 45.2 % (ref 38.5–50.0)
Hemoglobin: 15.3 g/dL (ref 13.2–17.1)
Lymphs Abs: 2330 cells/uL (ref 850–3900)
MCH: 31.9 pg (ref 27.0–33.0)
MCHC: 33.8 g/dL (ref 32.0–36.0)
MCV: 94.4 fL (ref 80.0–100.0)
MPV: 10.1 fL (ref 7.5–12.5)
Monocytes Relative: 13.6 %
Neutro Abs: 2302 cells/uL (ref 1500–7800)
Neutrophils Relative %: 41.1 %
Platelets: 148 10*3/uL (ref 140–400)
RBC: 4.79 10*6/uL (ref 4.20–5.80)
RDW: 13.2 % (ref 11.0–15.0)
Total Lymphocyte: 41.6 %
WBC mixed population: 762 cells/uL (ref 200–950)
WBC: 5.6 10*3/uL (ref 3.8–10.8)

## 2018-01-13 LAB — TSH: TSH: 1.61 mIU/L (ref 0.40–4.50)

## 2018-01-13 LAB — MAGNESIUM: Magnesium: 1.9 mg/dL (ref 1.5–2.5)

## 2018-02-17 ENCOUNTER — Other Ambulatory Visit: Payer: Self-pay | Admitting: Internal Medicine

## 2018-02-17 DIAGNOSIS — I10 Essential (primary) hypertension: Secondary | ICD-10-CM

## 2018-02-24 ENCOUNTER — Other Ambulatory Visit (INDEPENDENT_AMBULATORY_CARE_PROVIDER_SITE_OTHER): Payer: Self-pay | Admitting: Specialist

## 2018-02-26 NOTE — Telephone Encounter (Signed)
Tramadol refill request 

## 2018-02-26 NOTE — Telephone Encounter (Signed)
Called to pharm 

## 2018-03-09 DIAGNOSIS — S838X1A Sprain of other specified parts of right knee, initial encounter: Secondary | ICD-10-CM | POA: Diagnosis not present

## 2018-03-27 ENCOUNTER — Encounter: Payer: Self-pay | Admitting: Internal Medicine

## 2018-03-30 DIAGNOSIS — I2510 Atherosclerotic heart disease of native coronary artery without angina pectoris: Secondary | ICD-10-CM | POA: Diagnosis not present

## 2018-03-30 DIAGNOSIS — I10 Essential (primary) hypertension: Secondary | ICD-10-CM | POA: Diagnosis not present

## 2018-04-05 ENCOUNTER — Other Ambulatory Visit (INDEPENDENT_AMBULATORY_CARE_PROVIDER_SITE_OTHER): Payer: Self-pay | Admitting: Specialist

## 2018-04-05 NOTE — Telephone Encounter (Signed)
Omeprazole refill request.

## 2018-04-18 ENCOUNTER — Ambulatory Visit (INDEPENDENT_AMBULATORY_CARE_PROVIDER_SITE_OTHER): Payer: Medicare Other | Admitting: Internal Medicine

## 2018-04-18 ENCOUNTER — Encounter: Payer: Self-pay | Admitting: Internal Medicine

## 2018-04-18 VITALS — BP 136/78 | HR 65 | Ht 72.0 in | Wt 217.4 lb

## 2018-04-18 DIAGNOSIS — I2510 Atherosclerotic heart disease of native coronary artery without angina pectoris: Secondary | ICD-10-CM

## 2018-04-18 DIAGNOSIS — I491 Atrial premature depolarization: Secondary | ICD-10-CM

## 2018-04-18 DIAGNOSIS — I493 Ventricular premature depolarization: Secondary | ICD-10-CM
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
## 2018-04-18 NOTE — Patient Instructions (Addendum)
Medication Instructions:  Your physician recommends that you continue on your current medications as directed. Please refer to the Current Medication list given to you today.  Labwork: None ordered.  Testing/Procedures: None ordered.  Follow-Up: Your physician recommends that you schedule a follow-up appointment with Dr Jens Som in [place] to establish care  Your physician wants you to follow-up in: One Year with Dr Graciela Husbands. You will receive a reminder letter in the mail two months in advance. If you don't receive a letter, please call our office to schedule the follow-up appointment.   Any Other Special Instructions Will Be Listed Below (If Applicable).     If you need a refill on your cardiac medications before your next appointment, please call your pharmacy.

## 2018-04-20 DIAGNOSIS — E669 Obesity, unspecified: Secondary | ICD-10-CM | POA: Diagnosis not present

## 2018-04-20 DIAGNOSIS — E785 Hyperlipidemia, unspecified: Secondary | ICD-10-CM | POA: Diagnosis not present

## 2018-04-20 DIAGNOSIS — I2510 Atherosclerotic heart disease of native coronary artery without angina pectoris: Secondary | ICD-10-CM | POA: Diagnosis not present

## 2018-04-20 DIAGNOSIS — R809 Proteinuria, unspecified: Secondary | ICD-10-CM | POA: Diagnosis not present

## 2018-04-20 DIAGNOSIS — I129 Hypertensive chronic kidney disease with stage 1 through stage 4 chronic kidney disease, or unspecified chronic kidney disease: Secondary | ICD-10-CM | POA: Diagnosis not present

## 2018-04-20 DIAGNOSIS — R351 Nocturia: Secondary | ICD-10-CM | POA: Diagnosis not present

## 2018-04-27 ENCOUNTER — Other Ambulatory Visit (INDEPENDENT_AMBULATORY_CARE_PROVIDER_SITE_OTHER): Payer: Self-pay | Admitting: Specialist

## 2018-04-27 NOTE — Telephone Encounter (Signed)
Can you please advsie? Dr. Otelia SergeantNitka is out of the office

## 2018-04-27 NOTE — Telephone Encounter (Signed)
OK refill thanks 

## 2018-04-30 ENCOUNTER — Encounter: Payer: Self-pay | Admitting: Internal Medicine

## 2018-06-11 ENCOUNTER — Other Ambulatory Visit (INDEPENDENT_AMBULATORY_CARE_PROVIDER_SITE_OTHER): Payer: Self-pay | Admitting: Orthopaedic Surgery

## 2018-06-13 NOTE — Telephone Encounter (Signed)
Nitka patient, I refilled once when he was OOT, please send to him. Thank you

## 2018-06-13 NOTE — Telephone Encounter (Signed)
Ok to rf? 

## 2018-06-13 NOTE — Telephone Encounter (Signed)
See note from DrYates 

## 2018-06-13 NOTE — Telephone Encounter (Signed)
Tramadol refill request 

## 2018-06-13 NOTE — Telephone Encounter (Signed)
Got this again, not sure if pharmacy is sending it back electronically again to me . Just wanted to make sure you got it to [place] to approve or deny. Thanks . No reply needed

## 2018-06-27 ENCOUNTER — Other Ambulatory Visit: Payer: Self-pay | Admitting: Internal Medicine

## 2018-06-27 DIAGNOSIS — M1000 Idiopathic gout, unspecified site: Secondary | ICD-10-CM

## 2018-07-03 NOTE — Progress Notes (Signed)
HPI: FU CAD, mild cardiomyopathy, dyspnea and PVCs; followed by Dr Graciela Husbands and at [facility]; presents to establish with me. Echo 1/17 showed EF 50-55 and mild LAE. CPX 2/17 showed limitation due to deconditioning and body habitus; mild chronotropic incompetence. Cardiac MRI 1/17 showed EF 51 with no gad uptake or scar. Cardiac cath 4/19 at [facility] showed 90 mid to distal LAD, 90 D1, 60 PDA. Pt subsequently had PCI of LAD with DES and POBA of D1. Since last seen, patient has occasional sensation of dyspnea that is not particularly bothersome.  No orthopnea, PND, pedal edema.  He had mild chest pain 2 nights ago that resolved spontaneously.  No associated symptoms.  He does not have exertional chest pain.  Current Outpatient Medications  Medication Sig Dispense Refill  . acetaminophen-codeine (TYLENOL #3) 300-30 MG tablet Take 1 tablet by mouth every 4 (four) hours as needed. For pain    . allopurinol (ZYLOPRIM) 300 MG tablet TAKE 1/2 TO 1 TABLET BY MOUTH DAILY AS DIRECTED FOR GOUT 90 tablet 0  . allopurinol (ZYLOPRIM) 300 MG tablet TAKE 1/2 TO 1 TABLET BY MOUTH DAILY AS DIRECTED FOR GOUT 90 tablet 0  . aspirin 81 MG EC tablet Take 1 tablet by mouth daily.    Marland Kitchen atorvastatin (LIPITOR) 40 MG tablet TAKE 1 TABLET BY MOUTH EVERY DAY FOR CHOLESTEROL 90 tablet 1  . Cholecalciferol (VITAMIN D) 2000 units tablet Take 10,000 Units by mouth every morning.     . diazepam (VALIUM) 5 MG tablet Take 1 tablet 3 x  /day for Hereditary Tremor 90 tablet 2  . diclofenac sodium (VOLTAREN) 1 % GEL Apply 4 g topically 4 (four) times daily. 5 Tube 3  . losartan (COZAAR) 100 MG tablet TAKE 1/2 TABLET BY MOUTH TWICE DAILY 90 tablet 1  . MAGNESIUM PO Take 500 mg by mouth 2 (two) times daily.     . metoprolol succinate (TOPROL-XL) 25 MG 24 hr tablet Take 1 tablet by mouth daily.    . Minoxidil (ROGAINE MENS) 5 % FOAM Apply 1 application topically daily.    . mirabegron ER (MYRBETRIQ) 50 MG TB24 tablet Take 1 tablet (50 mg  total) by mouth daily. 30 tablet 5  . nitroGLYCERIN (NITROSTAT) 0.4 MG SL tablet Place 1 tablet under the tongue every 5 (five) minutes as needed. For chest pain    . Omega-3 Fatty Acids (FISH OIL) 1200 MG CPDR Take 2 capsules by mouth at bedtime.     Marland Kitchen omeprazole (PRILOSEC) 20 MG capsule TAKE 1 CAPSULE BY MOUTH DAILY 90 capsule 0  . ticagrelor (BRILINTA) 90 MG TABS tablet Take 1 tablet by mouth 2 (two) times daily.    . traMADol (ULTRAM) 50 MG tablet TAKE 1 TABLET BY MOUTH EVERY 6 HOURS AS NEEDED 30 tablet 0  . triamcinolone ointment (KENALOG) 0.1 % Apply 1 application topically 3 (three) times daily. (Patient taking differently: Apply 1 application topically 2 (two) times daily as needed. ) 80 g 3  . vitamin B-12 (CYANOCOBALAMIN) 1000 MCG tablet Take 1,000 mcg by mouth daily.    . Zinc 50 MG TABS Take 1 tablet by mouth daily.    Marland Kitchen zinc gluconate 50 MG tablet Take 50 mg by mouth daily.     No current facility-administered medications for this visit.      Past Medical History:  Diagnosis Date  . CAD (coronary artery disease)   . Essential tremor   . Gastric ulcer 01/14/2015  . Gout   .  Hyperlipidemia   . Hypertension   . Parkinson's disease (HCC)   . Prediabetes   . Proteinuria   . Vitamin D deficiency     Past Surgical History:  Procedure Laterality Date  . CYST EXCISION     back  . KNEE SURGERY Right   . TONSILLECTOMY      Social History   Socioeconomic History  . Marital status: Married    Spouse name: Not on file  . Number of children: Not on file  . Years of education: Not on file  . Highest education level: Not on file  Occupational History  . 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 Financial resource strain: Not on file  . Food insecurity:    Worry: Not on file    Inability: Not on file  . Transportation needs:    Medical: Not on file    Non-medical: Not on file  Tobacco Use  . Smoking status: Never Smoker  .  Smokeless tobacco: Never Used  Substance and Sexual Activity  . Alcohol use: Yes    Alcohol/week: 14.0 standard drinks    Types: 14 Standard drinks or equivalent per week    Comment: 2 drinks a night (gin and tonic)  . Drug use: No  . Sexual activity: Not on file  Lifestyle  . Physical activity:    Days per week: Not on file    Minutes per session: Not on file  . Stress: Not on file  Relationships  . Social connections:    Talks on phone: Not on file    Gets together: Not on file    Attends religious service: Not on file    Active member of club or organization: Not on file    Attends meetings of clubs or organizations: Not on file    Relationship status: Not on file  . Intimate partner violence:    Fear of current or ex partner: Not on file    Emotionally abused: Not on file    Physically abused: Not on file    Forced sexual activity: Not on file  Other Topics Concern  . Not on file  Social History Narrative  . Not on file    Family History  Problem Relation Age of Onset  . Dementia Mother   . Stroke Father   . Emphysema Brother   . Heart disease Brother     ROS: no fevers or chills, productive cough, hemoptysis, dysphasia, odynophagia, melena, hematochezia, dysuria, hematuria, rash, seizure activity, orthopnea, PND, pedal edema, claudication. Remaining systems are negative.  Physical Exam: Well-developed well-nourished in no acute distress.  Skin is warm and dry.  HEENT is normal.  Neck is supple.  Chest is clear to auscultation with normal expansion.  Cardiovascular exam is regular rate and rhythm.  Abdominal exam nontender or distended. No masses palpated. Extremities show no edema. neuro grossly intact  ECG-April 18, 2018-sinus rhythm with PACs and PVCs.  No ST changes.  Personally reviewed  Electrocardiogram today personally reviewed and shows sinus rhythm with PACs some of which are nonconducted, PVCs and no ST changes.  A/P  1 coronary artery  disease status post PCI-Plan to continue medical therapy with aspirin, Brilinta and statin.  Will discontinue Brilinta April 2020.  He had vague chest pain 2 nights ago that does not sound cardiac.  Electrocardiogram shows no ST changes.  We will not pursue further ischemia evaluation at this point unless he has recurrent  symptoms.  Note approximately 20 minutes spent reviewing records prior to patient arrival.  2 history of mild cardiomyopathy-continue ARB and beta-blocker.  No significant congestive heart failure on examination.  3 hypertension-blood pressure is controlled.  Continue present medications and follow.  4 hyperlipidemia-given documented coronary disease will increase Lipitor to 80 mg daily.  Check lipids and liver in 4 weeks.  5 PVCs-continue beta-blocker.  Olga Millers, MD

## 2018-07-07 ENCOUNTER — Other Ambulatory Visit (INDEPENDENT_AMBULATORY_CARE_PROVIDER_SITE_OTHER): Payer: Self-pay | Admitting: Specialist

## 2018-07-09 ENCOUNTER — Encounter: Payer: Self-pay | Admitting: Internal Medicine

## 2018-07-09 NOTE — Telephone Encounter (Signed)
Omeprazole refill request.

## 2018-07-09 NOTE — Progress Notes (Addendum)
[facility]   Leroy Griffin, M.D.     Dyanne Carrel. Steffanie Dunn, P.A.-C Judd Gaudier, DNP [facility]                [address]                [place], [place]. [address] Telephone [phone] Telefax [phone]  Comprehensive Evaluation & Examination     This very nice 72 y.o. MWM presents for a comprehensive evaluation and management of multiple medical co-morbidities.  Patient has been followed for HTN, ASCAD, HLD, Prediabetes and Vitamin D Deficiency. Patient has been evaluated by Dr Tat for Essential or Hereditary Tremor. To evaluate unstable gait, he had spinal tap to r/o NPH.  Patient has GERD controlled on his meds. Patient has Gout controlled on his Meds. He also has remote hx/o UGI bleeding consequent of DUD.       Patient is also c/o LUTS with N x 2-4 - usu small volume. Has been on Myrbetric for Urinary frequency/ urgency w/o perceived benefit      HTN predates since 1988. Patient's BP has been controlled at home.  Today's BP was initially sl elevated & rechecked @goal  -  140/86.  In 2017, he had negative screening Cardiopulmonary testing and Cardiac MRI per Dr Graciela Husbands.  In Apr 2019, patient was hospitalized at [facility] with ACS and Heart cath found CAD and he underwent PCA w/DES to the LAD. Patient denies any cardiac symptoms as chest pain, palpitations, shortness of breath, dizziness or ankle swelling. He also has CKD2 with idiopathic proteinuria & is followed by Dr Kathrene Bongo.      Patient's hyperlipidemia is controlled with diet and medications. Patient denies myalgias or other medication SE's. Last lipids were at goal: Lab Results  Component Value Date   CHOL 140 01/12/2018   HDL 34 (L) 01/12/2018   LDLCALC 85 01/12/2018   TRIG 109 01/12/2018   CHOLHDL 4.1 01/12/2018      Patient has hx/o Morbid Obesity (BMI 30+) and is monitored expectantly for prediabetes and patient denies reactive  hypoglycemic symptoms, visual blurring, diabetic polys or paresthesias. Last A1c was Normal & at goal: Lab Results  Component Value Date   HGBA1C 5.4 10/09/2017       Finally, patient has history of Vitamin D Deficiency ("11" / 2009)  and last vitamin D was at goal: Lab Results  Component Value Date   VD25OH 67 10/09/2017   Current Outpatient Medications on File Prior to Visit  Medication Sig  . acetaminophen-codeine (TYLENOL #3) 300-30 MG tablet Take 1 tablet by mouth every 4 (four) hours as needed. For pain  . allopurinol (ZYLOPRIM) 300 MG tablet TAKE 1/2 TO 1 TABLET BY MOUTH DAILY AS DIRECTED FOR GOUT  . allopurinol (ZYLOPRIM) 300 MG tablet TAKE 1/2 TO 1 TABLET BY MOUTH DAILY AS DIRECTED FOR GOUT  . aspirin 81 MG EC tablet Take 1 tablet by mouth daily.  Marland Kitchen atorvastatin (LIPITOR) 40 MG tablet TAKE 1 TABLET BY MOUTH EVERY DAY FOR CHOLESTEROL  . Cholecalciferol (VITAMIN D) 2000 units tablet Take 10,000 Units by mouth every morning.   . diazepam (VALIUM) 5 MG tablet Take 0.5 tablets by mouth daily as needed.  . diclofenac sodium (VOLTAREN) 1 % GEL Apply 4 g topically 4 (four) times daily.  Marland Kitchen losartan (COZAAR) 100 MG tablet TAKE 1/2 TABLET BY MOUTH TWICE DAILY  . MAGNESIUM PO Take 500 mg by mouth 2 (two)  times daily.   . metoprolol succinate (TOPROL-XL) 25 MG 24 hr tablet Take 1 tablet by mouth daily.  . Minoxidil (ROGAINE MENS) 5 % FOAM Apply 1 application topically daily.  . mirabegron ER (MYRBETRIQ) 50 MG TB24 tablet Take 1 tablet (50 mg total) by mouth daily.  . nitroGLYCERIN (NITROSTAT) 0.4 MG SL tablet Place 1 tablet under the tongue every 5 (five) minutes as needed. For chest pain  . Omega-3 Fatty Acids (FISH OIL) 1200 MG CPDR Take 2 capsules by mouth at bedtime.   Marland Kitchen. omeprazole (PRILOSEC) 20 MG capsule TAKE 1 CAPSULE BY MOUTH DAILY  . ticagrelor (BRILINTA) 90 MG TABS tablet Take 1 tablet by mouth 2 (two) times daily.  . traMADol (ULTRAM) 50 MG tablet TAKE 1 TABLET BY MOUTH EVERY 6  HOURS AS NEEDED  . triamcinolone ointment (KENALOG) 0.1 % Apply 1 application topically 3 (three) times daily. (Patient taking differently: Apply 1 application topically 2 (two) times daily as needed. )  . vitamin B-12 (CYANOCOBALAMIN) 1000 MCG tablet Take 1,000 mcg by mouth daily.  . Zinc 50 MG TABS Take 1 tablet by mouth daily.  Marland Kitchen. zinc gluconate 50 MG tablet Take 50 mg by mouth daily.   No current facility-administered medications on file prior to visit.    Allergies  Allergen Reactions  . Enalapril Rash  . Aspirin     Internal bleeding  . Nsaids     Internal bleeding  . Welchol [Colesevelam Hcl] Diarrhea  . Vasotec [Enalaprilat] Rash   Past Medical History:  Diagnosis Date  . Essential tremor   . Gastric ulcer 01/14/2015  . Gout   . Hyperlipidemia   . Hypertension   . Parkinson's disease (HCC)   . Prediabetes   . Proteinuria   . Vitamin D deficiency    Health Maintenance  Topic Date Due  . INFLUENZA VACCINE  03/08/2018  . Hepatitis C Screening  01/13/2019 (Originally 03/05/1946)  . COLONOSCOPY  02/02/2022  . TETANUS/TDAP  01/29/2024  . PNA vac Low Risk Adult  Completed   Immunization History  Administered Date(s) Administered  . Influenza Split 05/23/2013  . Influenza, High Dose Seasonal PF 05/14/2014, 06/11/2015, 05/19/2016  . Pneumococcal Conjugate-13 12/03/2014  . Pneumococcal Polysaccharide-23 06/01/2010  . Td 06/01/2010  . Tdap 01/28/2014  . Zoster 08/08/2004   Last Colon - 02/03/2012  - Dr Jarold MottoPatterson - Recc 10 yr f/u - due June / July 2023.   Past Surgical History:  Procedure Laterality Date  . CYST EXCISION     back  . KNEE SURGERY Right   . TONSILLECTOMY     Family History  Problem Relation Age of Onset  . Dementia Mother   . Stroke Father   . Emphysema Brother   . Heart disease Brother    Social History   Socioeconomic History  . Marital status: Married    Spouse name: Not on file  . Number of children: 2 children  Occupational History  .  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 Smoking status: Never Smoker  . Smokeless tobacco: Never Used  Substance and Sexual Activity  . Alcohol use: Yes    Alcohol/week: 14.0 standard drinks    Types: 14 Standard drinks or equivalent per week    Comment: 2 drinks a night (gin and tonic)  . Drug use: No  . Sexual activity: Active    ROS Constitutional: Denies fever, chills, weight loss/gain, headaches, insomnia,  night sweats or change in appetite. Does c/o fatigue. Eyes: Denies redness, blurred vision, diplopia, discharge, itchy or watery eyes.  ENT: Denies discharge, congestion, post nasal drip, epistaxis, sore throat, earache, hearing loss, dental pain, Tinnitus, Vertigo, Sinus pain or snoring.  Cardio: Denies chest pain, palpitations, irregular heartbeat, syncope, dyspnea, diaphoresis, orthopnea, PND, claudication or edema Respiratory: denies cough, dyspnea, DOE, pleurisy, hoarseness, laryngitis or wheezing.  Gastrointestinal: Denies dysphagia, heartburn, reflux, water brash, pain, cramps, nausea, vomiting, bloating, diarrhea, constipation, hematemesis, melena, hematochezia, jaundice or hemorrhoids Genitourinary: Denies dysuria, frequency, urgency, nocturia, hesitancy, discharge, hematuria or flank pain Musculoskeletal: Denies arthralgia, myalgia, stiffness, Jt. Swelling, pain, limp or strain/sprain. Denies Falls. Skin: Denies puritis, rash, hives, warts, acne, eczema or change in skin lesion Neuro: No weakness, tremor, incoordination, spasms, paresthesia or pain Psychiatric: Denies confusion, memory loss or sensory loss. Denies Depression. Endocrine: Denies change in weight, skin, hair change, nocturia, and paresthesia, diabetic polys, visual blurring or hyper / hypo glycemic episodes.  Heme/Lymph: No excessive bleeding, bruising or enlarged lymph nodes.  Physical Exam  BP 140/86   Pulse (!) 56   Temp 97.7 F (36.5  C)   Resp 14   Ht 6' (1.829 m)   Wt 218 lb (98.9 kg)   SpO2 96%   BMI 29.57 kg/m   General Appearance: Over nourished and well groomed and in no apparent distress.  Eyes: PERRLA, EOMs, conjunctiva no swelling or erythema, normal fundi and vessels. Sinuses: No frontal/maxillary tenderness ENT/Mouth: EACs patent / TMs  nl. Nares clear without erythema, swelling, mucoid exudates. Oral hygiene is good. No erythema, swelling, or exudate. Tongue normal, non-obstructing. Tonsils not swollen or erythematous. Hearing normal.  Neck: Supple, thyroid not palpable. No bruits, nodes or JVD. Respiratory: Respiratory effort normal.  BS equal and clear bilateral without rales, rhonci, wheezing or stridor. Cardio: Heart sounds are normal with regular rate and rhythm and no murmurs, rubs or gallops. Peripheral pulses are normal and equal bilaterally without edema. No aortic or femoral bruits. Chest: symmetric with normal excursions and percussion.  Abdomen: Soft, with Nl bowel sounds. Nontender, no guarding, rebound, hernias, masses, or organomegaly.  Lymphatics: Non tender without lymphadenopathy.  Musculoskeletal: Full ROM all peripheral extremities, joint stability, 5/5 strength, and normal gait. Skin: Warm and dry without rashes, lesions, cyanosis, clubbing or  ecchymosis.  Neuro: Cranial nerves intact, reflexes equal bilaterally. Normal muscle tone, no cerebellar symptoms. Sensation intact. No tremor today.  Pysch: Alert and oriented X 3 with normal affect, insight and judgment appropriate.   Assessment and Plan  1. Essential hypertension  - EKG 12-Lead - [place], RETROPERITNL ABD,  LTD - CBC with Differential/Platelet - COMPLETE METABOLIC PANEL WITH GFR - Magnesium - TSH  2. Hyperlipidemia, mixed  - EKG 12-Lead - [place], RETROPERITNL ABD,  LTD - Urinalysis, Routine w reflex microscopic - Microalbumin / creatinine urine ratio - Lipid panel - TSH  3. Abnormal glucose  - EKG 12-Lead - [place],  RETROPERITNL ABD,  LTD - Hemoglobin A1c - Insulin, random  4. Vitamin D deficiency  - VITAMIN D 25 Hydroxyl  5. Idiopathic gout  - Uric acid  6. Coronary artery disease involving native coronary artery of native heart  - Lipid panel  7. Midline low back pain without sciatica, unspecified chronicity   8. BPH with obstruction/lower urinary tract symptoms  - PSA  9. Screening for colorectal cancer  - POC Hemoccult Bld/Stl  10. Prostate cancer screening  - PSA  11. Screening for ischemic heart disease  -  EKG 12-Lead  12. FHx: heart disease  - EKG 12-Lead - [place], RETROPERITNL ABD,  LTD  13. Screening for AAA (aortic abdominal aneurysm)  - [place], RETROPERITNL ABD,  LTD  14. Essential tremor   15. Medication management  - Urinalysis, Routine w reflex microscopic - Microalbumin / creatinine urine ratio - CBC with Differential/Platelet - COMPLETE METABOLIC PANEL WITH GFR - Magnesium - Lipid panel - TSH - Hemoglobin A1c - Insulin, random - VITAMIN D 25 Hydroxyl - Uric acid  15. BPH w/LUTS   - Refer  Urology       Patient was counseled in prudent diet, weight control to achieve/maintain BMI less than 25, BP monitoring, regular exercise and medications as discussed.  Discussed med effects and SE's. Routine screening labs and tests as requested with regular follow-up as recommended. Over 40 minutes of exam, counseling, chart review and high complex critical decision making was performed

## 2018-07-09 NOTE — Patient Instructions (Signed)

## 2018-07-10 ENCOUNTER — Encounter: Payer: Self-pay | Admitting: Internal Medicine

## 2018-07-10 ENCOUNTER — Ambulatory Visit (INDEPENDENT_AMBULATORY_CARE_PROVIDER_SITE_OTHER): Payer: Medicare Other | Admitting: Internal Medicine

## 2018-07-10 VITALS — BP 140/86 | HR 56 | Temp 97.7°F | Resp 14 | Ht 72.0 in | Wt 218.0 lb

## 2018-07-10 DIAGNOSIS — Z8249 Family history of ischemic heart disease and other diseases of the circulatory system: Secondary | ICD-10-CM

## 2018-07-10 DIAGNOSIS — E782 Mixed hyperlipidemia: Secondary | ICD-10-CM

## 2018-07-10 DIAGNOSIS — I10 Essential (primary) hypertension: Secondary | ICD-10-CM

## 2018-07-10 DIAGNOSIS — N138 Other obstructive and reflux uropathy: Secondary | ICD-10-CM

## 2018-07-10 DIAGNOSIS — G250 Essential tremor: Secondary | ICD-10-CM | POA: Diagnosis not present

## 2018-07-10 DIAGNOSIS — Z136 Encounter for screening for cardiovascular disorders: Secondary | ICD-10-CM

## 2018-07-10 DIAGNOSIS — Z125 Encounter for screening for malignant neoplasm of prostate: Secondary | ICD-10-CM | POA: Diagnosis not present

## 2018-07-10 DIAGNOSIS — M1000 Idiopathic gout, unspecified site: Secondary | ICD-10-CM | POA: Diagnosis not present

## 2018-07-10 DIAGNOSIS — Z1211 Encounter for screening for malignant neoplasm of colon: Secondary | ICD-10-CM

## 2018-07-10 DIAGNOSIS — Z1212 Encounter for screening for malignant neoplasm of rectum: Secondary | ICD-10-CM

## 2018-07-10 DIAGNOSIS — M545 Low back pain, unspecified: Secondary | ICD-10-CM

## 2018-07-10 DIAGNOSIS — Z79899 Other long term (current) drug therapy: Secondary | ICD-10-CM

## 2018-07-10 DIAGNOSIS — R7309 Other abnormal glucose: Secondary | ICD-10-CM

## 2018-07-10 DIAGNOSIS — E559 Vitamin D deficiency, unspecified: Secondary | ICD-10-CM | POA: Diagnosis not present

## 2018-07-10 DIAGNOSIS — N401 Enlarged prostate with lower urinary tract symptoms: Secondary | ICD-10-CM | POA: Diagnosis not present

## 2018-07-10 DIAGNOSIS — I2510 Atherosclerotic heart disease of native coronary artery without angina pectoris: Secondary | ICD-10-CM

## 2018-07-10 MED ORDER — DIAZEPAM 5 MG PO TABS: ORAL_TABLET | ORAL | 2 refills | Status: DC

## 2018-07-11 ENCOUNTER — Ambulatory Visit (INDEPENDENT_AMBULATORY_CARE_PROVIDER_SITE_OTHER): Payer: Medicare Other | Admitting: Cardiology

## 2018-07-11 ENCOUNTER — Encounter: Payer: Self-pay | Admitting: Cardiology

## 2018-07-11 VITALS — BP 135/75 | HR 55 | Ht 72.0 in | Wt 217.1 lb

## 2018-07-11 DIAGNOSIS — I10 Essential (primary) hypertension: Secondary | ICD-10-CM | POA: Diagnosis not present

## 2018-07-11 DIAGNOSIS — I2510 Atherosclerotic heart disease of native coronary artery without angina pectoris: Secondary | ICD-10-CM | POA: Diagnosis not present

## 2018-07-11 DIAGNOSIS — I493 Ventricular premature depolarization: Secondary | ICD-10-CM

## 2018-07-11 DIAGNOSIS — E7800 Pure hypercholesterolemia, unspecified: Secondary | ICD-10-CM | POA: Diagnosis not present

## 2018-07-11 LAB — TSH: TSH: 2.48 mIU/L (ref 0.40–4.50)

## 2018-07-11 LAB — COMPLETE METABOLIC PANEL WITH GFR
AG Ratio: 1.6 (calc) (ref 1.0–2.5)
ALT: 14 U/L (ref 9–46)
AST: 24 U/L (ref 10–35)
Albumin: 3.9 g/dL (ref 3.6–5.1)
Alkaline phosphatase (APISO): 67 U/L (ref 40–115)
BUN: 16 mg/dL (ref 7–25)
CO2: 31 mmol/L (ref 20–32)
Calcium: 10.1 mg/dL (ref 8.6–10.3)
Chloride: 103 mmol/L (ref 98–110)
Creat: 0.99 mg/dL (ref 0.70–1.18)
GFR, Est African American: 87 mL/min/{1.73_m2} (ref >=60)
GFR, Est Non African American: 75 mL/min/{1.73_m2} (ref >=60)
Globulin: 2.5 g/dL (calc) (ref 1.9–3.7)
Glucose, Bld: 85 mg/dL (ref 65–99)
Potassium: 4.9 mmol/L (ref 3.5–5.3)
Sodium: 141 mmol/L (ref 135–146)
Total Bilirubin: 0.7 mg/dL (ref 0.2–1.2)
Total Protein: 6.4 g/dL (ref 6.1–8.1)

## 2018-07-11 LAB — CBC WITH DIFFERENTIAL/PLATELET
Basophils Absolute: 8 cells/uL (ref 0–200)
Basophils Relative: 0.2 %
Eosinophils Absolute: 151 cells/uL (ref 15–500)
Eosinophils Relative: 3.6 %
HCT: 45.2 % (ref 38.5–50.0)
Hemoglobin: 15.2 g/dL (ref 13.2–17.1)
Lymphs Abs: 1655 cells/uL (ref 850–3900)
MCH: 32.8 pg (ref 27.0–33.0)
MCHC: 33.6 g/dL (ref 32.0–36.0)
MCV: 97.6 fL (ref 80.0–100.0)
MPV: 10.2 fL (ref 7.5–12.5)
Monocytes Relative: 9.3 %
Neutro Abs: 1995 cells/uL (ref 1500–7800)
Neutrophils Relative %: 47.5 %
Platelets: 149 10*3/uL (ref 140–400)
RBC: 4.63 10*6/uL (ref 4.20–5.80)
RDW: 14 % (ref 11.0–15.0)
Total Lymphocyte: 39.4 %
WBC mixed population: 391 cells/uL (ref 200–950)
WBC: 4.2 10*3/uL (ref 3.8–10.8)

## 2018-07-11 LAB — HEMOGLOBIN A1C
Hgb A1c MFr Bld: 5.3 % of total Hgb (ref <5.7)
Mean Plasma Glucose: 105 (calc)
eAG (mmol/L): 5.8 (calc)

## 2018-07-11 LAB — PSA: PSA: 0.8 ng/mL (ref <=4.0)

## 2018-07-11 LAB — URIC ACID: Uric Acid, Serum: 4.9 mg/dL (ref 4.0–8.0)

## 2018-07-11 LAB — MAGNESIUM: Magnesium: 1.7 mg/dL (ref 1.5–2.5)

## 2018-07-11 LAB — LIPID PANEL
Cholesterol: 176 mg/dL (ref <200)
HDL: 53 mg/dL (ref >40)
LDL Cholesterol (Calc): 96 mg/dL (calc)
Non-HDL Cholesterol (Calc): 123 mg/dL (calc) (ref <130)
Total CHOL/HDL Ratio: 3.3 (calc) (ref <5.0)
Triglycerides: 167 mg/dL — ABNORMAL HIGH (ref <150)

## 2018-07-11 LAB — INSULIN, RANDOM: Insulin: 4.1 u[IU]/mL (ref 2.0–19.6)

## 2018-07-11 LAB — VITAMIN D 25 HYDROXY (VIT D DEFICIENCY, FRACTURES): Vit D, 25-Hydroxy: 65 ng/mL (ref 30–100)

## 2018-07-11 MED ORDER — ATORVASTATIN CALCIUM 80 MG PO TABS: ORAL_TABLET | ORAL | 3 refills | Status: DC

## 2018-07-11 NOTE — Patient Instructions (Signed)
Medication Instructions:   INCREASE ATORVASTATIN TO 80 MG ONCE DAILY= 2 OF THE 40 MG TABLETS ONCE DAILT  Labwork:  Your physician recommends that you return for lab work in: 4 WEEKS PRIOR TO EATING  Follow-Up:  Your physician recommends that you schedule a follow-up appointment in: 6 MONTHS WITH DR CRENSHAW PLEASE GIVE OUR OFFICE A CALL 2 MONTHS PRIOR TO THAT APPOINTMENT TIME TO SCHEDULE    [facility] June 2020

## 2018-07-28 DIAGNOSIS — R109 Unspecified abdominal pain: Secondary | ICD-10-CM | POA: Diagnosis not present

## 2018-07-28 DIAGNOSIS — A4151 Sepsis due to Escherichia coli [E. coli]: Secondary | ICD-10-CM | POA: Diagnosis not present

## 2018-07-28 DIAGNOSIS — R5600 Simple febrile convulsions: Secondary | ICD-10-CM | POA: Diagnosis not present

## 2018-07-28 DIAGNOSIS — N179 Acute kidney failure, unspecified: Secondary | ICD-10-CM | POA: Diagnosis not present

## 2018-07-28 DIAGNOSIS — I10 Essential (primary) hypertension: Secondary | ICD-10-CM | POA: Diagnosis not present

## 2018-07-28 DIAGNOSIS — R410 Disorientation, unspecified: Secondary | ICD-10-CM | POA: Diagnosis not present

## 2018-07-28 DIAGNOSIS — E872 Acidosis: Secondary | ICD-10-CM | POA: Diagnosis not present

## 2018-07-28 DIAGNOSIS — R1030 Lower abdominal pain, unspecified: Secondary | ICD-10-CM | POA: Diagnosis not present

## 2018-07-28 DIAGNOSIS — R945 Abnormal results of liver function studies: Secondary | ICD-10-CM | POA: Diagnosis not present

## 2018-07-28 DIAGNOSIS — R6521 Severe sepsis with septic shock: Secondary | ICD-10-CM | POA: Diagnosis not present

## 2018-07-28 DIAGNOSIS — G9389 Other specified disorders of brain: Secondary | ICD-10-CM | POA: Diagnosis not present

## 2018-07-28 DIAGNOSIS — R0602 Shortness of breath: Secondary | ICD-10-CM | POA: Diagnosis not present

## 2018-07-28 DIAGNOSIS — R079 Chest pain, unspecified: Secondary | ICD-10-CM | POA: Diagnosis not present

## 2018-07-28 DIAGNOSIS — I499 Cardiac arrhythmia, unspecified: Secondary | ICD-10-CM | POA: Diagnosis not present

## 2018-07-28 DIAGNOSIS — R6889 Other general symptoms and signs: Secondary | ICD-10-CM | POA: Diagnosis not present

## 2018-07-28 DIAGNOSIS — R0789 Other chest pain: Secondary | ICD-10-CM | POA: Diagnosis not present

## 2018-07-28 DIAGNOSIS — K8040 Calculus of bile duct with cholecystitis, unspecified, without obstruction: Secondary | ICD-10-CM | POA: Diagnosis not present

## 2018-07-28 DIAGNOSIS — I7090 Unspecified atherosclerosis: Secondary | ICD-10-CM | POA: Diagnosis not present

## 2018-07-29 DIAGNOSIS — M109 Gout, unspecified: Secondary | ICD-10-CM | POA: Diagnosis not present

## 2018-07-29 DIAGNOSIS — R410 Disorientation, unspecified: Secondary | ICD-10-CM | POA: Diagnosis not present

## 2018-07-29 DIAGNOSIS — I2510 Atherosclerotic heart disease of native coronary artery without angina pectoris: Secondary | ICD-10-CM | POA: Diagnosis not present

## 2018-07-29 DIAGNOSIS — A4159 Other Gram-negative sepsis: Secondary | ICD-10-CM | POA: Diagnosis not present

## 2018-07-29 DIAGNOSIS — E872 Acidosis: Secondary | ICD-10-CM | POA: Diagnosis present

## 2018-07-29 DIAGNOSIS — D696 Thrombocytopenia, unspecified: Secondary | ICD-10-CM | POA: Diagnosis not present

## 2018-07-29 DIAGNOSIS — K8309 Other cholangitis: Secondary | ICD-10-CM | POA: Diagnosis not present

## 2018-07-29 DIAGNOSIS — K5909 Other constipation: Secondary | ICD-10-CM | POA: Diagnosis not present

## 2018-07-29 DIAGNOSIS — R0789 Other chest pain: Secondary | ICD-10-CM | POA: Diagnosis not present

## 2018-07-29 DIAGNOSIS — Z886 Allergy status to analgesic agent status: Secondary | ICD-10-CM | POA: Diagnosis not present

## 2018-07-29 DIAGNOSIS — R41841 Cognitive communication deficit: Secondary | ICD-10-CM | POA: Diagnosis not present

## 2018-07-29 DIAGNOSIS — M5136 Other intervertebral disc degeneration, lumbar region: Secondary | ICD-10-CM | POA: Diagnosis present

## 2018-07-29 DIAGNOSIS — R6889 Other general symptoms and signs: Secondary | ICD-10-CM | POA: Diagnosis not present

## 2018-07-29 DIAGNOSIS — F0390 Unspecified dementia without behavioral disturbance: Secondary | ICD-10-CM | POA: Diagnosis present

## 2018-07-29 DIAGNOSIS — E7800 Pure hypercholesterolemia, unspecified: Secondary | ICD-10-CM | POA: Diagnosis present

## 2018-07-29 DIAGNOSIS — R7989 Other specified abnormal findings of blood chemistry: Secondary | ICD-10-CM | POA: Diagnosis not present

## 2018-07-29 DIAGNOSIS — Z888 Allergy status to other drugs, medicaments and biological substances status: Secondary | ICD-10-CM | POA: Diagnosis not present

## 2018-07-29 DIAGNOSIS — K279 Peptic ulcer, site unspecified, unspecified as acute or chronic, without hemorrhage or perforation: Secondary | ICD-10-CM | POA: Diagnosis present

## 2018-07-29 DIAGNOSIS — I7090 Unspecified atherosclerosis: Secondary | ICD-10-CM | POA: Diagnosis not present

## 2018-07-29 DIAGNOSIS — A419 Sepsis, unspecified organism: Secondary | ICD-10-CM | POA: Diagnosis not present

## 2018-07-29 DIAGNOSIS — R531 Weakness: Secondary | ICD-10-CM | POA: Diagnosis not present

## 2018-07-29 DIAGNOSIS — R231 Pallor: Secondary | ICD-10-CM | POA: Diagnosis not present

## 2018-07-29 DIAGNOSIS — K219 Gastro-esophageal reflux disease without esophagitis: Secondary | ICD-10-CM | POA: Diagnosis not present

## 2018-07-29 DIAGNOSIS — R2689 Other abnormalities of gait and mobility: Secondary | ICD-10-CM | POA: Diagnosis not present

## 2018-07-29 DIAGNOSIS — A4150 Gram-negative sepsis, unspecified: Secondary | ICD-10-CM | POA: Diagnosis not present

## 2018-07-29 DIAGNOSIS — M47816 Spondylosis without myelopathy or radiculopathy, lumbar region: Secondary | ICD-10-CM | POA: Diagnosis not present

## 2018-07-29 DIAGNOSIS — R079 Chest pain, unspecified: Secondary | ICD-10-CM | POA: Diagnosis not present

## 2018-07-29 DIAGNOSIS — Z48816 Encounter for surgical aftercare following surgery on the genitourinary system: Secondary | ICD-10-CM | POA: Diagnosis not present

## 2018-07-29 DIAGNOSIS — R109 Unspecified abdominal pain: Secondary | ICD-10-CM | POA: Diagnosis not present

## 2018-07-29 DIAGNOSIS — R269 Unspecified abnormalities of gait and mobility: Secondary | ICD-10-CM | POA: Diagnosis not present

## 2018-07-29 DIAGNOSIS — Z419 Encounter for procedure for purposes other than remedying health state, unspecified: Secondary | ICD-10-CM | POA: Diagnosis not present

## 2018-07-29 DIAGNOSIS — F418 Other specified anxiety disorders: Secondary | ICD-10-CM | POA: Diagnosis not present

## 2018-07-29 DIAGNOSIS — R0689 Other abnormalities of breathing: Secondary | ICD-10-CM | POA: Diagnosis not present

## 2018-07-29 DIAGNOSIS — R0602 Shortness of breath: Secondary | ICD-10-CM | POA: Diagnosis not present

## 2018-07-29 DIAGNOSIS — G9340 Encephalopathy, unspecified: Secondary | ICD-10-CM | POA: Diagnosis not present

## 2018-07-29 DIAGNOSIS — Z743 Need for continuous supervision: Secondary | ICD-10-CM | POA: Diagnosis not present

## 2018-07-29 DIAGNOSIS — R0902 Hypoxemia: Secondary | ICD-10-CM | POA: Diagnosis not present

## 2018-07-29 DIAGNOSIS — Z955 Presence of coronary angioplasty implant and graft: Secondary | ICD-10-CM | POA: Diagnosis not present

## 2018-07-29 DIAGNOSIS — K819 Cholecystitis, unspecified: Secondary | ICD-10-CM | POA: Diagnosis not present

## 2018-07-29 DIAGNOSIS — R5600 Simple febrile convulsions: Secondary | ICD-10-CM | POA: Diagnosis present

## 2018-07-29 DIAGNOSIS — A4151 Sepsis due to Escherichia coli [E. coli]: Secondary | ICD-10-CM | POA: Diagnosis not present

## 2018-07-29 DIAGNOSIS — K810 Acute cholecystitis: Secondary | ICD-10-CM | POA: Diagnosis not present

## 2018-07-29 DIAGNOSIS — G9389 Other specified disorders of brain: Secondary | ICD-10-CM | POA: Diagnosis not present

## 2018-07-29 DIAGNOSIS — R279 Unspecified lack of coordination: Secondary | ICD-10-CM | POA: Diagnosis not present

## 2018-07-29 DIAGNOSIS — R5381 Other malaise: Secondary | ICD-10-CM | POA: Diagnosis not present

## 2018-07-29 DIAGNOSIS — R17 Unspecified jaundice: Secondary | ICD-10-CM | POA: Diagnosis not present

## 2018-07-29 DIAGNOSIS — K828 Other specified diseases of gallbladder: Secondary | ICD-10-CM | POA: Diagnosis not present

## 2018-07-29 DIAGNOSIS — Z9049 Acquired absence of other specified parts of digestive tract: Secondary | ICD-10-CM | POA: Diagnosis not present

## 2018-07-29 DIAGNOSIS — K8050 Calculus of bile duct without cholangitis or cholecystitis without obstruction: Secondary | ICD-10-CM | POA: Diagnosis not present

## 2018-07-29 DIAGNOSIS — R945 Abnormal results of liver function studies: Secondary | ICD-10-CM | POA: Diagnosis not present

## 2018-07-29 DIAGNOSIS — M6281 Muscle weakness (generalized): Secondary | ICD-10-CM | POA: Diagnosis not present

## 2018-07-29 DIAGNOSIS — R34 Anuria and oliguria: Secondary | ICD-10-CM | POA: Diagnosis not present

## 2018-07-29 DIAGNOSIS — E785 Hyperlipidemia, unspecified: Secondary | ICD-10-CM | POA: Diagnosis not present

## 2018-07-29 DIAGNOSIS — N179 Acute kidney failure, unspecified: Secondary | ICD-10-CM | POA: Diagnosis not present

## 2018-07-29 DIAGNOSIS — K8040 Calculus of bile duct with cholecystitis, unspecified, without obstruction: Secondary | ICD-10-CM | POA: Diagnosis present

## 2018-07-29 DIAGNOSIS — R6521 Severe sepsis with septic shock: Secondary | ICD-10-CM | POA: Diagnosis not present

## 2018-07-29 DIAGNOSIS — I10 Essential (primary) hypertension: Secondary | ICD-10-CM | POA: Diagnosis not present

## 2018-07-29 DIAGNOSIS — Z960 Presence of urogenital implants: Secondary | ICD-10-CM | POA: Diagnosis not present

## 2018-07-29 DIAGNOSIS — R278 Other lack of coordination: Secondary | ICD-10-CM | POA: Diagnosis not present

## 2018-07-30 MED ORDER — ISOSORBIDE MONONITRATE ER 30 MG PO TB24
30.00 | ORAL_TABLET | ORAL | Status: DC
Start: 2018-07-30 — End: 2018-07-30

## 2018-07-30 MED ORDER — LOSARTAN POTASSIUM 100 MG PO TABS
100.00 | ORAL_TABLET | ORAL | Status: DC
Start: 2018-07-30 — End: 2018-07-30

## 2018-07-30 MED ORDER — GENERIC EXTERNAL MEDICATION
10.00 | Status: DC
Start: ? — End: 2018-07-30

## 2018-07-30 MED ORDER — DIAZEPAM 5 MG PO TABS
2.50 | ORAL_TABLET | ORAL | Status: DC
Start: ? — End: 2018-07-30

## 2018-07-30 MED ORDER — GENERIC EXTERNAL MEDICATION
4.00 | Status: DC
Start: ? — End: 2018-07-30

## 2018-07-30 MED ORDER — ALBUTEROL SULFATE (2.5 MG/3ML) 0.083% IN NEBU
2.50 | INHALATION_SOLUTION | RESPIRATORY_TRACT | Status: DC
Start: ? — End: 2018-07-30

## 2018-07-30 MED ORDER — METOPROLOL SUCCINATE ER 25 MG PO TB24
25.00 | ORAL_TABLET | ORAL | Status: DC
Start: 2018-07-30 — End: 2018-07-30

## 2018-07-30 MED ORDER — DICLOFENAC SODIUM 1 % TD GEL
4.00 | TRANSDERMAL | Status: DC
Start: 2018-07-29 — End: 2018-07-30

## 2018-07-30 MED ORDER — NITROGLYCERIN 0.4 MG SL SUBL
0.40 | SUBLINGUAL_TABLET | SUBLINGUAL | Status: DC
Start: ? — End: 2018-07-30

## 2018-07-30 MED ORDER — ASPIRIN EC 81 MG PO TBEC
81.00 | DELAYED_RELEASE_TABLET | ORAL | Status: DC
Start: 2018-07-30 — End: 2018-07-30

## 2018-07-30 MED ORDER — TICAGRELOR 90 MG PO TABS
90.00 | ORAL_TABLET | ORAL | Status: DC
Start: 2018-07-29 — End: 2018-07-30

## 2018-07-30 MED ORDER — SODIUM CHLORIDE 0.9 % IV SOLN
25.00 | INTRAVENOUS | Status: DC
Start: ? — End: 2018-07-30

## 2018-07-30 MED ORDER — ENOXAPARIN SODIUM 40 MG/0.4ML ~~LOC~~ SOLN
40.00 | SUBCUTANEOUS | Status: DC
Start: 2018-07-30 — End: 2018-07-30

## 2018-07-30 MED ORDER — ALLOPURINOL 100 MG PO TABS
150.00 | ORAL_TABLET | ORAL | Status: DC
Start: 2018-07-30 — End: 2018-07-30

## 2018-07-30 MED ORDER — POLYETHYLENE GLYCOL 3350 17 G PO PACK
17.00 | PACK | ORAL | Status: DC
Start: ? — End: 2018-07-30

## 2018-07-30 MED ORDER — SODIUM CHLORIDE 0.9 % IV SOLN
10.00 | INTRAVENOUS | Status: DC
Start: ? — End: 2018-07-30

## 2018-07-30 MED ORDER — ALUM & MAG HYDROXIDE-SIMETH 200-200-20 MG/5ML PO SUSP
30.00 | ORAL | Status: DC
Start: ? — End: 2018-07-30

## 2018-07-30 MED ORDER — ACETAMINOPHEN 325 MG PO TABS
650.00 | ORAL_TABLET | ORAL | Status: DC
Start: ? — End: 2018-07-30

## 2018-07-30 MED ORDER — PANTOPRAZOLE SODIUM 20 MG PO TBEC
20.00 | DELAYED_RELEASE_TABLET | ORAL | Status: DC
Start: 2018-07-30 — End: 2018-07-30

## 2018-07-30 MED ORDER — MAGNESIUM OXIDE 400 MG PO TABS
400.00 | ORAL_TABLET | ORAL | Status: DC
Start: 2018-07-29 — End: 2018-07-30

## 2018-07-30 MED ORDER — TRAMADOL HCL 50 MG PO TABS
50.00 | ORAL_TABLET | ORAL | Status: DC
Start: ? — End: 2018-07-30

## 2018-07-30 MED ORDER — ATORVASTATIN CALCIUM 40 MG PO TABS
40.00 | ORAL_TABLET | ORAL | Status: DC
Start: 2018-07-30 — End: 2018-07-30

## 2018-08-16 DIAGNOSIS — Z48816 Encounter for surgical aftercare following surgery on the genitourinary system: Secondary | ICD-10-CM | POA: Diagnosis not present

## 2018-08-16 DIAGNOSIS — E785 Hyperlipidemia, unspecified: Secondary | ICD-10-CM | POA: Diagnosis not present

## 2018-08-16 DIAGNOSIS — Z955 Presence of coronary angioplasty implant and graft: Secondary | ICD-10-CM | POA: Diagnosis not present

## 2018-08-16 DIAGNOSIS — F5102 Adjustment insomnia: Secondary | ICD-10-CM | POA: Diagnosis not present

## 2018-08-16 DIAGNOSIS — R279 Unspecified lack of coordination: Secondary | ICD-10-CM | POA: Diagnosis not present

## 2018-08-16 DIAGNOSIS — M109 Gout, unspecified: Secondary | ICD-10-CM | POA: Diagnosis not present

## 2018-08-16 DIAGNOSIS — Z743 Need for continuous supervision: Secondary | ICD-10-CM | POA: Diagnosis not present

## 2018-08-16 DIAGNOSIS — R41841 Cognitive communication deficit: Secondary | ICD-10-CM | POA: Diagnosis not present

## 2018-08-16 DIAGNOSIS — R278 Other lack of coordination: Secondary | ICD-10-CM | POA: Diagnosis not present

## 2018-08-16 DIAGNOSIS — A4150 Gram-negative sepsis, unspecified: Secondary | ICD-10-CM | POA: Diagnosis not present

## 2018-08-16 DIAGNOSIS — Z9049 Acquired absence of other specified parts of digestive tract: Secondary | ICD-10-CM | POA: Diagnosis not present

## 2018-08-16 DIAGNOSIS — K922 Gastrointestinal hemorrhage, unspecified: Secondary | ICD-10-CM | POA: Diagnosis not present

## 2018-08-16 DIAGNOSIS — Z9889 Other specified postprocedural states: Secondary | ICD-10-CM | POA: Diagnosis not present

## 2018-08-16 DIAGNOSIS — N179 Acute kidney failure, unspecified: Secondary | ICD-10-CM | POA: Diagnosis not present

## 2018-08-16 DIAGNOSIS — Z419 Encounter for procedure for purposes other than remedying health state, unspecified: Secondary | ICD-10-CM | POA: Diagnosis not present

## 2018-08-16 DIAGNOSIS — M6281 Muscle weakness (generalized): Secondary | ICD-10-CM | POA: Diagnosis not present

## 2018-08-16 DIAGNOSIS — R2689 Other abnormalities of gait and mobility: Secondary | ICD-10-CM | POA: Diagnosis not present

## 2018-08-16 DIAGNOSIS — Z960 Presence of urogenital implants: Secondary | ICD-10-CM | POA: Diagnosis not present

## 2018-08-16 DIAGNOSIS — R531 Weakness: Secondary | ICD-10-CM | POA: Diagnosis not present

## 2018-08-16 DIAGNOSIS — K8050 Calculus of bile duct without cholangitis or cholecystitis without obstruction: Secondary | ICD-10-CM | POA: Diagnosis not present

## 2018-08-16 DIAGNOSIS — D696 Thrombocytopenia, unspecified: Secondary | ICD-10-CM | POA: Diagnosis not present

## 2018-08-16 DIAGNOSIS — R945 Abnormal results of liver function studies: Secondary | ICD-10-CM | POA: Diagnosis not present

## 2018-08-16 DIAGNOSIS — F418 Other specified anxiety disorders: Secondary | ICD-10-CM | POA: Diagnosis not present

## 2018-08-16 DIAGNOSIS — R5381 Other malaise: Secondary | ICD-10-CM | POA: Diagnosis not present

## 2018-08-16 DIAGNOSIS — K5909 Other constipation: Secondary | ICD-10-CM | POA: Diagnosis not present

## 2018-08-16 DIAGNOSIS — I10 Essential (primary) hypertension: Secondary | ICD-10-CM | POA: Diagnosis not present

## 2018-08-16 DIAGNOSIS — R269 Unspecified abnormalities of gait and mobility: Secondary | ICD-10-CM | POA: Diagnosis not present

## 2018-08-16 DIAGNOSIS — M5136 Other intervertebral disc degeneration, lumbar region: Secondary | ICD-10-CM | POA: Diagnosis not present

## 2018-08-16 DIAGNOSIS — K219 Gastro-esophageal reflux disease without esophagitis: Secondary | ICD-10-CM | POA: Diagnosis not present

## 2018-08-16 DIAGNOSIS — M47816 Spondylosis without myelopathy or radiculopathy, lumbar region: Secondary | ICD-10-CM | POA: Diagnosis not present

## 2018-08-16 DIAGNOSIS — I2510 Atherosclerotic heart disease of native coronary artery without angina pectoris: Secondary | ICD-10-CM | POA: Diagnosis not present

## 2018-08-16 DIAGNOSIS — N183 Chronic kidney disease, stage 3 (moderate): Secondary | ICD-10-CM | POA: Diagnosis not present

## 2018-08-17 MED ORDER — ALBUTEROL SULFATE (2.5 MG/3ML) 0.083% IN NEBU
2.50 | INHALATION_SOLUTION | RESPIRATORY_TRACT | Status: DC
Start: ? — End: 2018-08-17

## 2018-08-17 MED ORDER — ASPIRIN EC 81 MG PO TBEC
81.00 | DELAYED_RELEASE_TABLET | ORAL | Status: DC
Start: 2018-08-17 — End: 2018-08-17

## 2018-08-17 MED ORDER — NITROGLYCERIN 0.4 MG SL SUBL
0.40 | SUBLINGUAL_TABLET | SUBLINGUAL | Status: DC
Start: ? — End: 2018-08-17

## 2018-08-17 MED ORDER — PANTOPRAZOLE SODIUM 20 MG PO TBEC
20.00 | DELAYED_RELEASE_TABLET | ORAL | Status: DC
Start: 2018-08-17 — End: 2018-08-17

## 2018-08-17 MED ORDER — SODIUM CHLORIDE 0.9 % IV SOLN
10.00 | INTRAVENOUS | Status: DC
Start: ? — End: 2018-08-17

## 2018-08-17 MED ORDER — ACETAMINOPHEN 325 MG PO TABS
650.00 | ORAL_TABLET | ORAL | Status: DC
Start: ? — End: 2018-08-17

## 2018-08-17 MED ORDER — DICLOFENAC SODIUM 1 % TD GEL
4.00 | TRANSDERMAL | Status: DC
Start: 2018-08-16 — End: 2018-08-17

## 2018-08-17 MED ORDER — HYDRALAZINE HCL 25 MG PO TABS
25.00 | ORAL_TABLET | ORAL | Status: DC
Start: 2018-08-16 — End: 2018-08-17

## 2018-08-17 MED ORDER — CLOTRIMAZOLE-BETAMETHASONE 1-0.05 % EX CREA
TOPICAL_CREAM | CUTANEOUS | Status: DC
Start: 2018-08-16 — End: 2018-08-17

## 2018-08-17 MED ORDER — AMOXICILLIN-POT CLAVULANATE 875-125 MG PO TABS
1.00 | ORAL_TABLET | ORAL | Status: DC
Start: 2018-08-16 — End: 2018-08-17

## 2018-08-17 MED ORDER — DIAZEPAM 5 MG PO TABS
2.50 | ORAL_TABLET | ORAL | Status: DC
Start: ? — End: 2018-08-17

## 2018-08-17 MED ORDER — ALUM & MAG HYDROXIDE-SIMETH 200-200-20 MG/5ML PO SUSP
30.00 | ORAL | Status: DC
Start: ? — End: 2018-08-17

## 2018-08-17 MED ORDER — TICAGRELOR 60 MG PO TABS
90.00 | ORAL_TABLET | ORAL | Status: DC
Start: 2018-08-16 — End: 2018-08-17

## 2018-08-17 MED ORDER — ACETAMINOPHEN 650 MG RE SUPP
650.00 | RECTAL | Status: DC
Start: ? — End: 2018-08-17

## 2018-08-17 MED ORDER — GENERIC EXTERNAL MEDICATION
1.00 | Status: DC
Start: ? — End: 2018-08-17

## 2018-08-17 MED ORDER — TRIAMCINOLONE ACETONIDE 0.1 % EX OINT
TOPICAL_OINTMENT | CUTANEOUS | Status: DC
Start: ? — End: 2018-08-17

## 2018-08-17 MED ORDER — POLYETHYLENE GLYCOL 3350 17 G PO PACK
17.00 | PACK | ORAL | Status: DC
Start: ? — End: 2018-08-17

## 2018-08-17 MED ORDER — METOPROLOL SUCCINATE ER 25 MG PO TB24
25.00 | ORAL_TABLET | ORAL | Status: DC
Start: 2018-08-17 — End: 2018-08-17

## 2018-08-17 MED ORDER — HEPARIN SODIUM (PORCINE) 5000 UNIT/ML IJ SOLN
5000.00 | INTRAMUSCULAR | Status: DC
Start: 2018-08-16 — End: 2018-08-17

## 2018-08-17 MED ORDER — GENERIC EXTERNAL MEDICATION
10.00 | Status: DC
Start: ? — End: 2018-08-17

## 2018-08-20 DIAGNOSIS — R945 Abnormal results of liver function studies: Secondary | ICD-10-CM | POA: Diagnosis not present

## 2018-08-20 DIAGNOSIS — I10 Essential (primary) hypertension: Secondary | ICD-10-CM | POA: Diagnosis not present

## 2018-08-20 DIAGNOSIS — E785 Hyperlipidemia, unspecified: Secondary | ICD-10-CM | POA: Diagnosis not present

## 2018-08-20 DIAGNOSIS — I2510 Atherosclerotic heart disease of native coronary artery without angina pectoris: Secondary | ICD-10-CM | POA: Diagnosis not present

## 2018-08-20 DIAGNOSIS — K8050 Calculus of bile duct without cholangitis or cholecystitis without obstruction: Secondary | ICD-10-CM | POA: Diagnosis not present

## 2018-08-20 DIAGNOSIS — A4150 Gram-negative sepsis, unspecified: Secondary | ICD-10-CM | POA: Diagnosis not present

## 2018-08-20 DIAGNOSIS — F5102 Adjustment insomnia: Secondary | ICD-10-CM | POA: Diagnosis not present

## 2018-08-21 DIAGNOSIS — R531 Weakness: Secondary | ICD-10-CM | POA: Diagnosis not present

## 2018-08-21 DIAGNOSIS — K8050 Calculus of bile duct without cholangitis or cholecystitis without obstruction: Secondary | ICD-10-CM | POA: Diagnosis not present

## 2018-08-21 DIAGNOSIS — D696 Thrombocytopenia, unspecified: Secondary | ICD-10-CM | POA: Diagnosis not present

## 2018-08-21 DIAGNOSIS — I2510 Atherosclerotic heart disease of native coronary artery without angina pectoris: Secondary | ICD-10-CM | POA: Diagnosis not present

## 2018-08-21 DIAGNOSIS — Z9889 Other specified postprocedural states: Secondary | ICD-10-CM | POA: Diagnosis not present

## 2018-08-21 DIAGNOSIS — N183 Chronic kidney disease, stage 3 (moderate): Secondary | ICD-10-CM | POA: Diagnosis not present

## 2018-08-21 DIAGNOSIS — E785 Hyperlipidemia, unspecified: Secondary | ICD-10-CM | POA: Diagnosis not present

## 2018-08-21 DIAGNOSIS — R945 Abnormal results of liver function studies: Secondary | ICD-10-CM | POA: Diagnosis not present

## 2018-08-21 DIAGNOSIS — I10 Essential (primary) hypertension: Secondary | ICD-10-CM | POA: Diagnosis not present

## 2018-08-21 DIAGNOSIS — R5381 Other malaise: Secondary | ICD-10-CM | POA: Diagnosis not present

## 2018-08-21 DIAGNOSIS — Z955 Presence of coronary angioplasty implant and graft: Secondary | ICD-10-CM | POA: Diagnosis not present

## 2018-08-21 DIAGNOSIS — A4150 Gram-negative sepsis, unspecified: Secondary | ICD-10-CM | POA: Diagnosis not present

## 2018-08-31 DIAGNOSIS — I10 Essential (primary) hypertension: Secondary | ICD-10-CM | POA: Diagnosis not present

## 2018-08-31 DIAGNOSIS — K922 Gastrointestinal hemorrhage, unspecified: Secondary | ICD-10-CM | POA: Diagnosis not present

## 2018-08-31 DIAGNOSIS — I2510 Atherosclerotic heart disease of native coronary artery without angina pectoris: Secondary | ICD-10-CM | POA: Diagnosis not present

## 2018-08-31 DIAGNOSIS — A4150 Gram-negative sepsis, unspecified: Secondary | ICD-10-CM | POA: Diagnosis not present

## 2018-08-31 DIAGNOSIS — E785 Hyperlipidemia, unspecified: Secondary | ICD-10-CM | POA: Diagnosis not present

## 2018-08-31 DIAGNOSIS — Z955 Presence of coronary angioplasty implant and graft: Secondary | ICD-10-CM | POA: Diagnosis not present

## 2018-09-03 DIAGNOSIS — M5136 Other intervertebral disc degeneration, lumbar region: Secondary | ICD-10-CM | POA: Diagnosis not present

## 2018-09-03 DIAGNOSIS — Z48815 Encounter for surgical aftercare following surgery on the digestive system: Secondary | ICD-10-CM | POA: Diagnosis not present

## 2018-09-03 DIAGNOSIS — F419 Anxiety disorder, unspecified: Secondary | ICD-10-CM | POA: Diagnosis not present

## 2018-09-03 DIAGNOSIS — M1009 Idiopathic gout, multiple sites: Secondary | ICD-10-CM | POA: Diagnosis not present

## 2018-09-03 DIAGNOSIS — M47817 Spondylosis without myelopathy or radiculopathy, lumbosacral region: Secondary | ICD-10-CM | POA: Diagnosis not present

## 2018-09-03 DIAGNOSIS — E785 Hyperlipidemia, unspecified: Secondary | ICD-10-CM | POA: Diagnosis not present

## 2018-09-03 DIAGNOSIS — I129 Hypertensive chronic kidney disease with stage 1 through stage 4 chronic kidney disease, or unspecified chronic kidney disease: Secondary | ICD-10-CM | POA: Diagnosis not present

## 2018-09-03 DIAGNOSIS — I25110 Atherosclerotic heart disease of native coronary artery with unstable angina pectoris: Secondary | ICD-10-CM | POA: Diagnosis not present

## 2018-09-03 DIAGNOSIS — Z960 Presence of urogenital implants: Secondary | ICD-10-CM | POA: Diagnosis not present

## 2018-09-03 DIAGNOSIS — N183 Chronic kidney disease, stage 3 (moderate): Secondary | ICD-10-CM | POA: Diagnosis not present

## 2018-09-04 DIAGNOSIS — M1009 Idiopathic gout, multiple sites: Secondary | ICD-10-CM | POA: Diagnosis not present

## 2018-09-04 DIAGNOSIS — I25110 Atherosclerotic heart disease of native coronary artery with unstable angina pectoris: Secondary | ICD-10-CM | POA: Diagnosis not present

## 2018-09-04 DIAGNOSIS — M5136 Other intervertebral disc degeneration, lumbar region: Secondary | ICD-10-CM | POA: Diagnosis not present

## 2018-09-04 DIAGNOSIS — I129 Hypertensive chronic kidney disease with stage 1 through stage 4 chronic kidney disease, or unspecified chronic kidney disease: Secondary | ICD-10-CM | POA: Diagnosis not present

## 2018-09-04 DIAGNOSIS — N183 Chronic kidney disease, stage 3 (moderate): Secondary | ICD-10-CM | POA: Diagnosis not present

## 2018-09-04 DIAGNOSIS — Z48815 Encounter for surgical aftercare following surgery on the digestive system: Secondary | ICD-10-CM | POA: Diagnosis not present

## 2018-09-06 DIAGNOSIS — I25110 Atherosclerotic heart disease of native coronary artery with unstable angina pectoris: Secondary | ICD-10-CM | POA: Diagnosis not present

## 2018-09-06 DIAGNOSIS — M1009 Idiopathic gout, multiple sites: Secondary | ICD-10-CM | POA: Diagnosis not present

## 2018-09-06 DIAGNOSIS — M5136 Other intervertebral disc degeneration, lumbar region: Secondary | ICD-10-CM | POA: Diagnosis not present

## 2018-09-06 DIAGNOSIS — Z48815 Encounter for surgical aftercare following surgery on the digestive system: Secondary | ICD-10-CM | POA: Diagnosis not present

## 2018-09-06 DIAGNOSIS — N183 Chronic kidney disease, stage 3 (moderate): Secondary | ICD-10-CM | POA: Diagnosis not present

## 2018-09-06 DIAGNOSIS — I129 Hypertensive chronic kidney disease with stage 1 through stage 4 chronic kidney disease, or unspecified chronic kidney disease: Secondary | ICD-10-CM | POA: Diagnosis not present

## 2018-09-07 DIAGNOSIS — K8050 Calculus of bile duct without cholangitis or cholecystitis without obstruction: Secondary | ICD-10-CM | POA: Diagnosis not present

## 2018-09-07 DIAGNOSIS — R945 Abnormal results of liver function studies: Secondary | ICD-10-CM | POA: Diagnosis not present

## 2018-09-07 DIAGNOSIS — Z823 Family history of stroke: Secondary | ICD-10-CM | POA: Diagnosis not present

## 2018-09-07 DIAGNOSIS — M1990 Unspecified osteoarthritis, unspecified site: Secondary | ICD-10-CM | POA: Diagnosis not present

## 2018-09-07 DIAGNOSIS — Z7982 Long term (current) use of aspirin: Secondary | ICD-10-CM | POA: Diagnosis not present

## 2018-09-07 DIAGNOSIS — I2510 Atherosclerotic heart disease of native coronary artery without angina pectoris: Secondary | ICD-10-CM | POA: Diagnosis not present

## 2018-09-07 DIAGNOSIS — N179 Acute kidney failure, unspecified: Secondary | ICD-10-CM | POA: Diagnosis not present

## 2018-09-07 DIAGNOSIS — I10 Essential (primary) hypertension: Secondary | ICD-10-CM | POA: Diagnosis not present

## 2018-09-07 DIAGNOSIS — E7800 Pure hypercholesterolemia, unspecified: Secondary | ICD-10-CM | POA: Diagnosis not present

## 2018-09-07 DIAGNOSIS — Z955 Presence of coronary angioplasty implant and graft: Secondary | ICD-10-CM | POA: Diagnosis not present

## 2018-09-07 DIAGNOSIS — Z79899 Other long term (current) drug therapy: Secondary | ICD-10-CM | POA: Diagnosis not present

## 2018-09-07 DIAGNOSIS — K838 Other specified diseases of biliary tract: Secondary | ICD-10-CM | POA: Diagnosis not present

## 2018-09-07 DIAGNOSIS — K8071 Calculus of gallbladder and bile duct without cholecystitis with obstruction: Secondary | ICD-10-CM | POA: Diagnosis not present

## 2018-09-10 DIAGNOSIS — M1009 Idiopathic gout, multiple sites: Secondary | ICD-10-CM | POA: Diagnosis not present

## 2018-09-10 DIAGNOSIS — M5136 Other intervertebral disc degeneration, lumbar region: Secondary | ICD-10-CM | POA: Diagnosis not present

## 2018-09-10 DIAGNOSIS — I129 Hypertensive chronic kidney disease with stage 1 through stage 4 chronic kidney disease, or unspecified chronic kidney disease: Secondary | ICD-10-CM | POA: Diagnosis not present

## 2018-09-10 DIAGNOSIS — N183 Chronic kidney disease, stage 3 (moderate): Secondary | ICD-10-CM | POA: Diagnosis not present

## 2018-09-10 DIAGNOSIS — Z48815 Encounter for surgical aftercare following surgery on the digestive system: Secondary | ICD-10-CM | POA: Diagnosis not present

## 2018-09-10 DIAGNOSIS — I25110 Atherosclerotic heart disease of native coronary artery with unstable angina pectoris: Secondary | ICD-10-CM | POA: Diagnosis not present

## 2018-09-11 ENCOUNTER — Encounter: Payer: Self-pay | Admitting: *Deleted

## 2018-09-12 DIAGNOSIS — M1009 Idiopathic gout, multiple sites: Secondary | ICD-10-CM | POA: Diagnosis not present

## 2018-09-12 DIAGNOSIS — I129 Hypertensive chronic kidney disease with stage 1 through stage 4 chronic kidney disease, or unspecified chronic kidney disease: Secondary | ICD-10-CM | POA: Diagnosis not present

## 2018-09-12 DIAGNOSIS — M5136 Other intervertebral disc degeneration, lumbar region: Secondary | ICD-10-CM | POA: Diagnosis not present

## 2018-09-12 DIAGNOSIS — Z48815 Encounter for surgical aftercare following surgery on the digestive system: Secondary | ICD-10-CM | POA: Diagnosis not present

## 2018-09-12 DIAGNOSIS — N183 Chronic kidney disease, stage 3 (moderate): Secondary | ICD-10-CM | POA: Diagnosis not present

## 2018-09-12 DIAGNOSIS — I25110 Atherosclerotic heart disease of native coronary artery with unstable angina pectoris: Secondary | ICD-10-CM | POA: Diagnosis not present

## 2018-09-18 DIAGNOSIS — I129 Hypertensive chronic kidney disease with stage 1 through stage 4 chronic kidney disease, or unspecified chronic kidney disease: Secondary | ICD-10-CM | POA: Diagnosis not present

## 2018-09-18 DIAGNOSIS — N183 Chronic kidney disease, stage 3 (moderate): Secondary | ICD-10-CM | POA: Diagnosis not present

## 2018-09-18 DIAGNOSIS — Z48815 Encounter for surgical aftercare following surgery on the digestive system: Secondary | ICD-10-CM | POA: Diagnosis not present

## 2018-09-18 DIAGNOSIS — M5136 Other intervertebral disc degeneration, lumbar region: Secondary | ICD-10-CM | POA: Diagnosis not present

## 2018-09-18 DIAGNOSIS — I25110 Atherosclerotic heart disease of native coronary artery with unstable angina pectoris: Secondary | ICD-10-CM | POA: Diagnosis not present

## 2018-09-18 DIAGNOSIS — M1009 Idiopathic gout, multiple sites: Secondary | ICD-10-CM | POA: Diagnosis not present

## 2018-09-19 DIAGNOSIS — Z9889 Other specified postprocedural states: Secondary | ICD-10-CM | POA: Diagnosis not present

## 2018-09-20 DIAGNOSIS — I25110 Atherosclerotic heart disease of native coronary artery with unstable angina pectoris: Secondary | ICD-10-CM | POA: Diagnosis not present

## 2018-09-20 DIAGNOSIS — Z48815 Encounter for surgical aftercare following surgery on the digestive system: Secondary | ICD-10-CM | POA: Diagnosis not present

## 2018-09-20 DIAGNOSIS — M1009 Idiopathic gout, multiple sites: Secondary | ICD-10-CM | POA: Diagnosis not present

## 2018-09-20 DIAGNOSIS — N183 Chronic kidney disease, stage 3 (moderate): Secondary | ICD-10-CM | POA: Diagnosis not present

## 2018-09-20 DIAGNOSIS — M5136 Other intervertebral disc degeneration, lumbar region: Secondary | ICD-10-CM | POA: Diagnosis not present

## 2018-09-20 DIAGNOSIS — I129 Hypertensive chronic kidney disease with stage 1 through stage 4 chronic kidney disease, or unspecified chronic kidney disease: Secondary | ICD-10-CM | POA: Diagnosis not present

## 2018-09-21 NOTE — Telephone Encounter (Signed)
I am not sure why this came to me. Please route to the appropriate provider. Tereso Newcomer, PA-C    09/21/2018 2:27 PM

## 2018-09-25 DIAGNOSIS — M5136 Other intervertebral disc degeneration, lumbar region: Secondary | ICD-10-CM | POA: Diagnosis not present

## 2018-09-25 DIAGNOSIS — Z48815 Encounter for surgical aftercare following surgery on the digestive system: Secondary | ICD-10-CM | POA: Diagnosis not present

## 2018-09-25 DIAGNOSIS — I129 Hypertensive chronic kidney disease with stage 1 through stage 4 chronic kidney disease, or unspecified chronic kidney disease: Secondary | ICD-10-CM | POA: Diagnosis not present

## 2018-09-25 DIAGNOSIS — M1009 Idiopathic gout, multiple sites: Secondary | ICD-10-CM | POA: Diagnosis not present

## 2018-09-25 DIAGNOSIS — I25110 Atherosclerotic heart disease of native coronary artery with unstable angina pectoris: Secondary | ICD-10-CM | POA: Diagnosis not present

## 2018-09-25 DIAGNOSIS — N183 Chronic kidney disease, stage 3 (moderate): Secondary | ICD-10-CM | POA: Diagnosis not present

## 2018-09-27 DIAGNOSIS — N183 Chronic kidney disease, stage 3 (moderate): Secondary | ICD-10-CM | POA: Diagnosis not present

## 2018-09-27 DIAGNOSIS — I25110 Atherosclerotic heart disease of native coronary artery with unstable angina pectoris: Secondary | ICD-10-CM | POA: Diagnosis not present

## 2018-09-27 DIAGNOSIS — M5136 Other intervertebral disc degeneration, lumbar region: Secondary | ICD-10-CM | POA: Diagnosis not present

## 2018-09-27 DIAGNOSIS — Z48815 Encounter for surgical aftercare following surgery on the digestive system: Secondary | ICD-10-CM | POA: Diagnosis not present

## 2018-09-27 DIAGNOSIS — I129 Hypertensive chronic kidney disease with stage 1 through stage 4 chronic kidney disease, or unspecified chronic kidney disease: Secondary | ICD-10-CM | POA: Diagnosis not present

## 2018-09-27 DIAGNOSIS — M1009 Idiopathic gout, multiple sites: Secondary | ICD-10-CM | POA: Diagnosis not present

## 2018-10-11 NOTE — Progress Notes (Signed)
Patient ID: Leroy Griffin, male   DOB: 06/14/1946, 73 y.o.   MRN: [id number]  Assessment and Plan:   Essential hypertension - continue medications, DASH diet, exercise and monitor at home. Call if greater than 130/80.  -     CBC with Differential/Platelet -     COMPLETE METABOLIC PANEL WITH GFR -     TSH  Hyperlipidemia check lipids decrease fatty foods increase activity.  -     Lipid panel  Proteinuria, unspecified type Follow up urology  Medication management -     Magnesium  Overactive bladder Has follow up with urology, declined urine today  Thrush -     nystatin (MYCOSTATIN) 100000 UNIT/ML suspension; 5 ml four times a day, retain in mouth as long as possible (Swish and Spit).  Use for 48 hours after symptoms resolve.  Sepsis due to Escherichia coli with encephalopathy and septic shock [facility]) Patient with weakness from recent hospitalization Will check labs emphaszied needs to do PT  Iron deficiency -     Iron,Total/Total Iron Binding Cap  B12 deficiency -     Vitamin B12     Continue diet and meds as discussed. Further disposition pending results of labs. Future Appointments  Date Time Provider Department Center  01/17/2019 11:15 AM Judd Gaudier, NP [facility] None  04/23/2019  2:00 PM Duke Salvia, MD [facility]  08/08/2019  3:00 PM Lucky Cowboy, MD [facility] None    HPI 73 y.o. male  presents for 3 month follow up with hypertension, hyperlipidemia, prediabetes and vitamin D.  Patient had recent hospitalization at [facility] for E coli sepsis thought to be due to cholangitis presenting with acute encephalopathy, elevated LFTs and septic shock, had ERCP 12/23 with removal of CBD stone with pus, had left sent placed at that time, had choley 08/08/18. Consulted ID, was on zosyn and culture positive to flagyl which was completed 08/12/18. Then placed on outpatient augmentin until stent removal 01/31, discharged to rehab.   Followed with up  surgical associated 02/12 with normal LFT/CBC at that time.   Cholecystectomy; Upper gastrointestinal endoscopy (11/16/2017); ERCP (07/30/2018);    His blood pressure has been controlled at home, today their BP is BP: 124/68.   He does not workout. He denies chest pain and dizziness.  He has been following for cardiology with Dr. Graciela Husbands, negative stress test/cardiac MRI 09/2015. Patient had PCA s/p DES stent to LAD in April 2019. Follws with Dr. Graciela Husbands.                             Follows with Dr. Arbutus Leas for essential tremor.  He has a history of GI bleeds.   He is on cholesterol medication and denies myalgias. His cholesterol is at goal. The cholesterol last visit was:   Lab Results  Component Value Date   CHOL 176 07/10/2018   HDL 53 07/10/2018   LDLCALC 96 07/10/2018   TRIG 167 (H) 07/10/2018   CHOLHDL 3.3 07/10/2018   Last W0J in the office was:  Lab Results  Component Value Date   HGBA1C 5.3 07/10/2018   Patient is on Vitamin D supplement.  Lab Results  Component Value Date   VD25OH 65 07/10/2018     BMI is Body mass index is 27.67 kg/m., he is working on diet and exercise. Wt Readings from Last 3 Encounters:  10/15/18 204 lb (92.5 kg)  07/11/18 217 lb 1.9 oz (98.5 kg)  07/10/18  218 lb (98.9 kg)   Has CKD with proteinuia, follows with Dr. Kathrene Bongo.   Lab Results  Component Value Date   GFRNONAA 75 07/10/2018    Current Medications:  Current Outpatient Medications on File Prior to Visit  Medication Sig Dispense Refill  . acetaminophen-codeine (TYLENOL #3) 300-30 MG tablet Take 1 tablet by mouth every 4 (four) hours as needed. For pain    . aspirin 81 MG EC tablet Take 1 tablet by mouth daily.    Marland Kitchen atorvastatin (LIPITOR) 80 MG tablet TAKE 1 TABLET BY MOUTH EVERY DAY FOR CHOLESTEROL 90 tablet 3  . Cholecalciferol (VITAMIN D) 2000 units tablet Take 10,000 Units by mouth every morning.     . diazepam (VALIUM) 5 MG tablet Take 1 tablet 3 x  /day for Hereditary Tremor  90 tablet 2  . diclofenac sodium (VOLTAREN) 1 % GEL Apply 4 g topically 4 (four) times daily. 5 Tube 3  . MAGNESIUM PO Take 500 mg by mouth 2 (two) times daily.     . metoprolol succinate (TOPROL-XL) 25 MG 24 hr tablet Take 1 tablet by mouth daily.    . Minoxidil (ROGAINE MENS) 5 % FOAM Apply 1 application topically daily.    . mirabegron ER (MYRBETRIQ) 50 MG TB24 tablet Take 1 tablet (50 mg total) by mouth daily. 30 tablet 5  . nitroGLYCERIN (NITROSTAT) 0.4 MG SL tablet Place 1 tablet under the tongue every 5 (five) minutes as needed. For chest pain    . Omega-3 Fatty Acids (FISH OIL) 1200 MG CPDR Take 2 capsules by mouth at bedtime.     Marland Kitchen omeprazole (PRILOSEC) 20 MG capsule TAKE 1 CAPSULE BY MOUTH DAILY 90 capsule 0  . ticagrelor (BRILINTA) 90 MG TABS tablet Take 1 tablet by mouth 2 (two) times daily.    . traMADol (ULTRAM) 50 MG tablet TAKE 1 TABLET BY MOUTH EVERY 6 HOURS AS NEEDED 30 tablet 0  . triamcinolone ointment (KENALOG) 0.1 % Apply 1 application topically 3 (three) times daily. (Patient taking differently: Apply 1 application topically 2 (two) times daily as needed. ) 80 g 3  . vitamin B-12 (CYANOCOBALAMIN) 1000 MCG tablet Take 1,000 mcg by mouth daily.    . Zinc 50 MG TABS Take 1 tablet by mouth daily.    Marland Kitchen zinc gluconate 50 MG tablet Take 50 mg by mouth daily.     No current facility-administered medications on file prior to visit.     Medical History:  Past Medical History:  Diagnosis Date  . CAD (coronary artery disease)   . Essential tremor   . Gastric ulcer 01/14/2015  . Gout   . Hyperlipidemia   . Hypertension   . Parkinson's disease (HCC)   . Prediabetes   . Proteinuria   . Vitamin D deficiency     Allergies:  Allergies  Allergen Reactions  . Enalapril Rash  . Aspirin     Internal bleeding  . Nsaids     Internal bleeding  . Welchol [Colesevelam Hcl] Diarrhea  . Vasotec [Enalaprilat] Rash     Review of Systems:  Review of Systems  Constitutional:  Negative.  Negative for chills, fever and malaise/fatigue.  HENT: Negative.  Negative for congestion, ear pain and sore throat.   Eyes: Negative.   Respiratory: Negative for cough, shortness of breath and wheezing.   Cardiovascular: Negative.  Negative for chest pain, palpitations and leg swelling.  Gastrointestinal: Negative for abdominal pain, blood in stool, constipation, diarrhea, heartburn, melena, nausea and  vomiting.  Genitourinary: Negative.   Musculoskeletal: Positive for back pain and myalgias (x 1 year). Negative for falls, joint pain and neck pain.  Skin: Negative.   Neurological: Positive for focal weakness (legs). Negative for dizziness, tingling, tremors, sensory change, speech change, seizures, loss of consciousness and headaches.  Psychiatric/Behavioral: Negative.  Negative for depression. The patient is not nervous/anxious and does not have insomnia.     Family history- Review and unchanged  Social history- Review and unchanged  Physical Exam: BP 124/68   Pulse (!) 54   Temp 98 F (36.7 C)   Ht 6' (1.829 m)   Wt 204 lb (92.5 kg)   SpO2 98%   BMI 27.67 kg/m  Wt Readings from Last 3 Encounters:  10/15/18 204 lb (92.5 kg)  07/11/18 217 lb 1.9 oz (98.5 kg)  07/10/18 218 lb (98.9 kg)    General appearance: alert, no distress, WD/WN, male, Mask facies HEENT: normocephalic, sclerae anicteric, TMs pearly, nares patent, no discharge or erythema, pharynx normal Oral cavity: MMM, Tongue with erythema/scrapable white plaque Neck: supple, no lymphadenopathy, no thyromegaly, no masses Heart: RRR, normal S1, S2, no murmurs Lungs: CTA bilaterally, no wheezes, rhonchi, or rales Abdomen: +bs, soft, non tender, non distended, no masses, no hepatomegaly, no splenomegaly Musculoskeletal: nontender, no swelling, no obvious deformity Extremities: no edema, no cyanosis, no clubbing Pulses: 2+ symmetric, upper and lower extremities, normal cap refill Neurological: alert, oriented  x 3, CN2-12 intact, strength decreased upper extremities and lower extremities,, sensation normal throughout, DTRs 2+ throughout, no cerebellar signs, abnormal gait with wheelchair Psychiatric: normal affect, behavior normal, pleasant    Quentin Mulling, PA-C 11:40 AM [facility]

## 2018-10-15 ENCOUNTER — Ambulatory Visit (INDEPENDENT_AMBULATORY_CARE_PROVIDER_SITE_OTHER): Payer: Medicare Other | Admitting: Physician Assistant

## 2018-10-15 ENCOUNTER — Encounter: Payer: Self-pay | Admitting: Physician Assistant

## 2018-10-15 VITALS — BP 124/68 | HR 54 | Temp 98.0°F | Ht 72.0 in | Wt 204.0 lb

## 2018-10-15 DIAGNOSIS — E611 Iron deficiency: Secondary | ICD-10-CM

## 2018-10-15 DIAGNOSIS — I10 Essential (primary) hypertension: Secondary | ICD-10-CM

## 2018-10-15 DIAGNOSIS — B370 Candidal stomatitis: Secondary | ICD-10-CM | POA: Diagnosis not present

## 2018-10-15 DIAGNOSIS — N3281 Overactive bladder: Secondary | ICD-10-CM | POA: Diagnosis not present

## 2018-10-15 DIAGNOSIS — A4151 Sepsis due to Escherichia coli [E. coli]: Secondary | ICD-10-CM

## 2018-10-15 DIAGNOSIS — E538 Deficiency of other specified B group vitamins: Secondary | ICD-10-CM

## 2018-10-15 DIAGNOSIS — R7309 Other abnormal glucose: Secondary | ICD-10-CM | POA: Diagnosis not present

## 2018-10-15 DIAGNOSIS — R809 Proteinuria, unspecified: Secondary | ICD-10-CM

## 2018-10-15 DIAGNOSIS — R6521 Severe sepsis with septic shock: Secondary | ICD-10-CM

## 2018-10-15 DIAGNOSIS — Z79899 Other long term (current) drug therapy: Secondary | ICD-10-CM

## 2018-10-15 DIAGNOSIS — G9340 Encephalopathy, unspecified: Secondary | ICD-10-CM

## 2018-10-15 DIAGNOSIS — E782 Mixed hyperlipidemia: Secondary | ICD-10-CM | POA: Diagnosis not present

## 2018-10-15 MED ORDER — NYSTATIN 100000 UNIT/ML MT SUSP: OROMUCOSAL | 0 refills | Status: DC

## 2018-10-15 NOTE — Patient Instructions (Addendum)
Coronavirus or Covid 19  . You will use the same precautions as you would with the flu virus.  . While we do not have a vaccine against Covid 19 and will not for several years, WE do have a vaccine against the flu. Please get this if you have not yet.  Marland Kitchen Please wash your hands frequently. Leroy Griffin your hands before you eat or touch your face.  . Avoid touching your face, eyes, nose, mouth as much as possible.  . Routinely clean frequently touched items such as doorknobs, keyboards, and phones.  . Avoid crowds of people.   To get more information from reputable sources:  You can call this hotline set up by Daisy Lazar [phone]  You can visit these websites: CDC.gov WHO.int   Do your exercises   Oral Thrush, Adult  Oral thrush, also called oral candidiasis, is a fungal infection that develops in the mouth and throat and on the tongue. It causes white patches to form on the mouth and tongue. Leroy Griffin is most common in older adults, but it can occur at any age. Many cases of thrush are mild, but this infection can also be serious. Leroy Griffin can be a repeated (recurrent) problem for certain people who have a weak body defense system (immune system). The weakness can be caused by chronic illnesses, or by taking medicines that limit the body's ability to fight infection. If a person has difficulty fighting infection, the fungus that causes thrush can spread through the body. This can cause life-threatening blood or organ infections. What are the causes? This condition is caused by a fungus (yeast) called Candida albicans.  This fungus is normally present in small amounts in the mouth and on other mucous membranes. It usually causes no harm.  If conditions are present that allow the fungus to grow without control, it invades surrounding tissues and becomes an infection.  Other Candida species can also lead to thrush (rare). What increases the risk? This condition is more likely to develop  in:  People with a weakened immune system.  Older adults.  People with HIV (human immunodeficiency virus).  People with diabetes.  People with dry mouth (xerostomia).  Pregnant women.  People with poor dental care, especially people who have false teeth.  People who use antibiotic medicines. What are the signs or symptoms? Symptoms of this condition can vary from mild and moderate to severe and persistent. Symptoms may include:  A burning feeling in the mouth and throat. This can occur at the start of a thrush infection.  White patches that stick to the mouth and tongue. The tissue around the patches may be red, raw, and painful. If rubbed (during tooth brushing, for example), the patches and the tissue of the mouth may bleed easily.  A bad taste in the mouth or difficulty tasting foods.  A cottony feeling in the mouth.  Pain during eating and swallowing.  Poor appetite.  Cracking at the corners of the mouth. How is this diagnosed? This condition is diagnosed based on:  Physical exam. Your health care provider will look in your mouth.  Health history. Your health care provider will ask you questions about your health. How is this treated? This condition is treated with medicines called antifungals, which prevent the growth of fungi. These medicines are either applied directly to the affected area (topical) or swallowed (oral). The treatment will depend on the severity of the condition. Mild thrush Mild cases of thrush may clear up with the use  of an antifungal mouth rinse or lozenges. Treatment usually lasts about 14 days. Moderate to severe thrush  More severe thrush infections that have spread to the esophagus are treated with an oral antifungal medicine. A topical antifungal medicine may also be used.  For some severe infections, treatment may need to continue for more than 14 days.  Oral antifungal medicines are rarely used during pregnancy because they may be  harmful to the unborn child. If you are pregnant, talk with your health care provider about options for treatment. Persistent or recurrent thrush For cases of thrush that do not go away or keep coming back:  Treatment may be needed twice as long as the symptoms last.  Treatment will include both oral and topical antifungal medicines.  People with a weakened immune system can take an antifungal medicine on a continuous basis to prevent thrush infections. It is important to treat conditions that make a person more likely to get thrush, such as diabetes or HIV. Follow these instructions at home: Medicines  Take over-the-counter and prescription medicines only as told by your health care provider.  Talk with your health care provider about an over-the-counter medicine called gentian violet, which kills bacteria and fungi. Relieving soreness and discomfort To help reduce the discomfort of thrush:  Drink cold liquids such as water or iced tea.  Try flavored ice treats or frozen juices.  Eat foods that are easy to swallow, such as gelatin, ice cream, or custard.  Try drinking from a straw if the patches in your mouth are painful.  General instructions  Eat plain, unflavored yogurt as directed by your health care provider. Check the label to make sure the yogurt contains live cultures. This yogurt can help healthy bacteria to grow in the mouth and can stop the growth of the fungus that causes thrush.  If you wear dentures, remove the dentures before going to bed, brush them vigorously, and soak them in a cleaning solution as directed by your health care provider.  Rinse your mouth with a warm salt-water mixture several times a day. To make a salt-water mixture, completely dissolve 1/2-1 tsp of salt in 1 cup of warm water. Contact a health care provider if:  Your symptoms are getting worse or are not improving within 7 days of starting treatment.  You have symptoms of a spreading  infection, such as white patches on the skin outside of the mouth. This information is not intended to replace advice given to you by your health care provider. Make sure you discuss any questions you have with your health care provider. Document Released: 04/19/2004 Document Revised: 04/18/2016 Document Reviewed: 04/18/2016 Elsevier Interactive Patient Education  [employer].

## 2018-10-16 LAB — VITAMIN B12: Vitamin B-12: 1207 pg/mL — ABNORMAL HIGH (ref 200–1100)

## 2018-10-16 LAB — CBC WITH DIFFERENTIAL/PLATELET
Absolute Monocytes: 361 cells/uL (ref 200–950)
Basophils Absolute: 40 cells/uL (ref 0–200)
Basophils Relative: 0.9 %
Eosinophils Absolute: 330 cells/uL (ref 15–500)
Eosinophils Relative: 7.5 %
HCT: 36.2 % — ABNORMAL LOW (ref 38.5–50.0)
Hemoglobin: 12.3 g/dL — ABNORMAL LOW (ref 13.2–17.1)
Lymphs Abs: 1505 cells/uL (ref 850–3900)
MCH: 32.3 pg (ref 27.0–33.0)
MCHC: 34 g/dL (ref 32.0–36.0)
MCV: 95 fL (ref 80.0–100.0)
MPV: 11 fL (ref 7.5–12.5)
Monocytes Relative: 8.2 %
Neutro Abs: 2165 cells/uL (ref 1500–7800)
Neutrophils Relative %: 49.2 %
Platelets: 138 10*3/uL — ABNORMAL LOW (ref 140–400)
RBC: 3.81 10*6/uL — ABNORMAL LOW (ref 4.20–5.80)
RDW: 13.6 % (ref 11.0–15.0)
Total Lymphocyte: 34.2 %
WBC: 4.4 10*3/uL (ref 3.8–10.8)

## 2018-10-16 LAB — COMPLETE METABOLIC PANEL WITH GFR
AG Ratio: 1.4 (calc) (ref 1.0–2.5)
ALT: 23 U/L (ref 9–46)
AST: 28 U/L (ref 10–35)
Albumin: 3.7 g/dL (ref 3.6–5.1)
Alkaline phosphatase (APISO): 70 U/L (ref 35–144)
BUN: 17 mg/dL (ref 7–25)
CO2: 30 mmol/L (ref 20–32)
Calcium: 9.6 mg/dL (ref 8.6–10.3)
Chloride: 106 mmol/L (ref 98–110)
Creat: 1.06 mg/dL (ref 0.70–1.18)
GFR, Est African American: 80 mL/min/{1.73_m2} (ref >=60)
GFR, Est Non African American: 69 mL/min/{1.73_m2} (ref >=60)
Globulin: 2.6 g/dL (calc) (ref 1.9–3.7)
Glucose, Bld: 104 mg/dL — ABNORMAL HIGH (ref 65–99)
Potassium: 3.9 mmol/L (ref 3.5–5.3)
Sodium: 143 mmol/L (ref 135–146)
Total Bilirubin: 0.8 mg/dL (ref 0.2–1.2)
Total Protein: 6.3 g/dL (ref 6.1–8.1)

## 2018-10-16 LAB — LIPID PANEL
Cholesterol: 131 mg/dL (ref <200)
HDL: 29 mg/dL — ABNORMAL LOW (ref >=40)
LDL Cholesterol (Calc): 78 mg/dL (calc)
Non-HDL Cholesterol (Calc): 102 mg/dL (calc) (ref <130)
Total CHOL/HDL Ratio: 4.5 (calc) (ref <5.0)
Triglycerides: 147 mg/dL (ref <150)

## 2018-10-16 LAB — TSH: TSH: 0.92 mIU/L (ref 0.40–4.50)

## 2018-10-16 LAB — IRON, TOTAL/TOTAL IRON BINDING CAP
%SAT: 43 % (calc) (ref 20–48)
Iron: 86 ug/dL (ref 50–180)
TIBC: 201 mcg/dL (calc) — ABNORMAL LOW (ref 250–425)

## 2018-10-16 LAB — MAGNESIUM: Magnesium: 1.6 mg/dL (ref 1.5–2.5)

## 2018-10-21 ENCOUNTER — Other Ambulatory Visit (INDEPENDENT_AMBULATORY_CARE_PROVIDER_SITE_OTHER): Payer: Self-pay | Admitting: Specialist

## 2018-10-22 NOTE — Telephone Encounter (Signed)
Refill request

## 2018-10-23 ENCOUNTER — Other Ambulatory Visit: Payer: Self-pay | Admitting: Adult Health

## 2018-11-01 ENCOUNTER — Other Ambulatory Visit (INDEPENDENT_AMBULATORY_CARE_PROVIDER_SITE_OTHER): Payer: Self-pay | Admitting: Specialist

## 2018-11-01 NOTE — Telephone Encounter (Signed)
Tramadol refill request 

## 2018-11-08 DIAGNOSIS — I4891 Unspecified atrial fibrillation: Secondary | ICD-10-CM | POA: Diagnosis not present

## 2018-11-08 DIAGNOSIS — F419 Anxiety disorder, unspecified: Secondary | ICD-10-CM | POA: Diagnosis not present

## 2018-11-08 DIAGNOSIS — E785 Hyperlipidemia, unspecified: Secondary | ICD-10-CM | POA: Diagnosis not present

## 2018-11-08 DIAGNOSIS — E7800 Pure hypercholesterolemia, unspecified: Secondary | ICD-10-CM | POA: Diagnosis not present

## 2018-11-08 DIAGNOSIS — I517 Cardiomegaly: Secondary | ICD-10-CM | POA: Diagnosis not present

## 2018-11-08 DIAGNOSIS — R2689 Other abnormalities of gait and mobility: Secondary | ICD-10-CM | POA: Diagnosis not present

## 2018-11-08 DIAGNOSIS — R945 Abnormal results of liver function studies: Secondary | ICD-10-CM | POA: Diagnosis not present

## 2018-11-08 DIAGNOSIS — Z955 Presence of coronary angioplasty implant and graft: Secondary | ICD-10-CM | POA: Diagnosis not present

## 2018-11-08 DIAGNOSIS — R7989 Other specified abnormal findings of blood chemistry: Secondary | ICD-10-CM | POA: Diagnosis not present

## 2018-11-08 DIAGNOSIS — M6281 Muscle weakness (generalized): Secondary | ICD-10-CM | POA: Diagnosis not present

## 2018-11-08 DIAGNOSIS — Z7409 Other reduced mobility: Secondary | ICD-10-CM | POA: Diagnosis not present

## 2018-11-08 DIAGNOSIS — I519 Heart disease, unspecified: Secondary | ICD-10-CM | POA: Diagnosis not present

## 2018-11-08 DIAGNOSIS — I10 Essential (primary) hypertension: Secondary | ICD-10-CM | POA: Diagnosis not present

## 2018-11-08 DIAGNOSIS — Z7902 Long term (current) use of antithrombotics/antiplatelets: Secondary | ICD-10-CM | POA: Diagnosis not present

## 2018-11-08 DIAGNOSIS — M5136 Other intervertebral disc degeneration, lumbar region: Secondary | ICD-10-CM | POA: Diagnosis not present

## 2018-11-08 DIAGNOSIS — I080 Rheumatic disorders of both mitral and aortic valves: Secondary | ICD-10-CM | POA: Diagnosis not present

## 2018-11-08 DIAGNOSIS — I499 Cardiac arrhythmia, unspecified: Secondary | ICD-10-CM | POA: Diagnosis not present

## 2018-11-08 DIAGNOSIS — Z888 Allergy status to other drugs, medicaments and biological substances status: Secondary | ICD-10-CM | POA: Diagnosis not present

## 2018-11-08 DIAGNOSIS — E559 Vitamin D deficiency, unspecified: Secondary | ICD-10-CM | POA: Diagnosis not present

## 2018-11-08 DIAGNOSIS — I2510 Atherosclerotic heart disease of native coronary artery without angina pectoris: Secondary | ICD-10-CM | POA: Diagnosis not present

## 2018-11-08 DIAGNOSIS — M47816 Spondylosis without myelopathy or radiculopathy, lumbar region: Secondary | ICD-10-CM | POA: Diagnosis not present

## 2018-11-08 DIAGNOSIS — Z7982 Long term (current) use of aspirin: Secondary | ICD-10-CM | POA: Diagnosis not present

## 2018-11-08 DIAGNOSIS — K219 Gastro-esophageal reflux disease without esophagitis: Secondary | ICD-10-CM | POA: Diagnosis not present

## 2018-11-08 DIAGNOSIS — D696 Thrombocytopenia, unspecified: Secondary | ICD-10-CM | POA: Diagnosis not present

## 2018-11-08 DIAGNOSIS — R531 Weakness: Secondary | ICD-10-CM | POA: Diagnosis not present

## 2018-11-08 DIAGNOSIS — Z9181 History of falling: Secondary | ICD-10-CM | POA: Diagnosis not present

## 2018-11-08 DIAGNOSIS — Z79899 Other long term (current) drug therapy: Secondary | ICD-10-CM | POA: Diagnosis not present

## 2018-11-08 DIAGNOSIS — Z9049 Acquired absence of other specified parts of digestive tract: Secondary | ICD-10-CM | POA: Diagnosis not present

## 2018-11-08 DIAGNOSIS — M109 Gout, unspecified: Secondary | ICD-10-CM | POA: Diagnosis not present

## 2018-11-08 DIAGNOSIS — Z886 Allergy status to analgesic agent status: Secondary | ICD-10-CM | POA: Diagnosis not present

## 2018-11-09 DIAGNOSIS — I10 Essential (primary) hypertension: Secondary | ICD-10-CM | POA: Diagnosis not present

## 2018-11-09 DIAGNOSIS — E785 Hyperlipidemia, unspecified: Secondary | ICD-10-CM | POA: Diagnosis not present

## 2018-11-09 DIAGNOSIS — R269 Unspecified abnormalities of gait and mobility: Secondary | ICD-10-CM | POA: Diagnosis not present

## 2018-11-10 DIAGNOSIS — R945 Abnormal results of liver function studies: Secondary | ICD-10-CM | POA: Diagnosis not present

## 2018-11-10 DIAGNOSIS — R269 Unspecified abnormalities of gait and mobility: Secondary | ICD-10-CM | POA: Diagnosis not present

## 2018-11-10 DIAGNOSIS — I2510 Atherosclerotic heart disease of native coronary artery without angina pectoris: Secondary | ICD-10-CM | POA: Diagnosis not present

## 2018-11-10 DIAGNOSIS — E559 Vitamin D deficiency, unspecified: Secondary | ICD-10-CM | POA: Diagnosis not present

## 2018-11-10 DIAGNOSIS — R7989 Other specified abnormal findings of blood chemistry: Secondary | ICD-10-CM | POA: Diagnosis not present

## 2018-11-10 DIAGNOSIS — F419 Anxiety disorder, unspecified: Secondary | ICD-10-CM | POA: Diagnosis not present

## 2018-11-10 DIAGNOSIS — M109 Gout, unspecified: Secondary | ICD-10-CM | POA: Diagnosis not present

## 2018-11-10 DIAGNOSIS — M6281 Muscle weakness (generalized): Secondary | ICD-10-CM | POA: Diagnosis not present

## 2018-11-10 DIAGNOSIS — I10 Essential (primary) hypertension: Secondary | ICD-10-CM | POA: Diagnosis not present

## 2018-11-10 DIAGNOSIS — D696 Thrombocytopenia, unspecified: Secondary | ICD-10-CM | POA: Diagnosis not present

## 2018-11-10 DIAGNOSIS — E7800 Pure hypercholesterolemia, unspecified: Secondary | ICD-10-CM | POA: Diagnosis not present

## 2018-11-10 DIAGNOSIS — E785 Hyperlipidemia, unspecified: Secondary | ICD-10-CM | POA: Diagnosis not present

## 2018-11-10 DIAGNOSIS — R2689 Other abnormalities of gait and mobility: Secondary | ICD-10-CM | POA: Diagnosis not present

## 2018-11-10 DIAGNOSIS — K219 Gastro-esophageal reflux disease without esophagitis: Secondary | ICD-10-CM | POA: Diagnosis not present

## 2018-11-11 MED ORDER — NITROGLYCERIN 0.4 MG SL SUBL
.40 | SUBLINGUAL_TABLET | SUBLINGUAL | Status: DC
Start: ? — End: 2018-11-11

## 2018-11-11 MED ORDER — PANTOPRAZOLE SODIUM 20 MG PO TBEC
20.00 | DELAYED_RELEASE_TABLET | ORAL | Status: DC
Start: 2018-11-11 — End: 2018-11-11

## 2018-11-11 MED ORDER — DIAZEPAM 5 MG PO TABS
5.00 | ORAL_TABLET | ORAL | Status: DC
Start: 2018-11-10 — End: 2018-11-11

## 2018-11-11 MED ORDER — ISOSORBIDE MONONITRATE ER 30 MG PO TB24
30.00 | ORAL_TABLET | ORAL | Status: DC
Start: 2018-11-11 — End: 2018-11-11

## 2018-11-11 MED ORDER — GENERIC EXTERNAL MEDICATION
Status: DC
Start: ? — End: 2018-11-11

## 2018-11-11 MED ORDER — HYDRALAZINE HCL 20 MG/ML IJ SOLN
10.00 | INTRAMUSCULAR | Status: DC
Start: ? — End: 2018-11-11

## 2018-11-11 MED ORDER — ACETAMINOPHEN 650 MG RE SUPP
650.00 | RECTAL | Status: DC
Start: ? — End: 2018-11-11

## 2018-11-11 MED ORDER — ALUM & MAG HYDROXIDE-SIMETH 200-200-20 MG/5ML PO SUSP
30.00 | ORAL | Status: DC
Start: ? — End: 2018-11-11

## 2018-11-11 MED ORDER — FISH OIL 1000 MG PO CAPS
1.00 | ORAL_CAPSULE | ORAL | Status: DC
Start: 2018-11-11 — End: 2018-11-11

## 2018-11-11 MED ORDER — VITAMIN B-12 1000 MCG PO TABS
1000.00 | ORAL_TABLET | ORAL | Status: DC
Start: 2018-11-11 — End: 2018-11-11

## 2018-11-11 MED ORDER — SODIUM CHLORIDE 0.9 % IV SOLN
125.00 | INTRAVENOUS | Status: DC
Start: ? — End: 2018-11-11

## 2018-11-11 MED ORDER — TICAGRELOR 90 MG PO TABS
90.00 | ORAL_TABLET | ORAL | Status: DC
Start: 2018-11-10 — End: 2018-11-11

## 2018-11-11 MED ORDER — MAGNESIUM HYDROXIDE 400 MG/5ML PO SUSP
15.00 | ORAL | Status: DC
Start: ? — End: 2018-11-11

## 2018-11-11 MED ORDER — TRAMADOL HCL 50 MG PO TABS
50.00 | ORAL_TABLET | ORAL | Status: DC
Start: ? — End: 2018-11-11

## 2018-11-11 MED ORDER — MAGNESIUM OXIDE 400 MG PO TABS
400.00 | ORAL_TABLET | ORAL | Status: DC
Start: 2018-11-10 — End: 2018-11-11

## 2018-11-11 MED ORDER — ACETAMINOPHEN 325 MG PO TABS
650.00 | ORAL_TABLET | ORAL | Status: DC
Start: ? — End: 2018-11-11

## 2018-11-11 MED ORDER — GENERIC EXTERNAL MEDICATION
5.00 | Status: DC
Start: 2018-11-10 — End: 2018-11-11

## 2018-11-11 MED ORDER — METOPROLOL SUCCINATE ER 25 MG PO TB24
25.00 | ORAL_TABLET | ORAL | Status: DC
Start: 2018-11-11 — End: 2018-11-11

## 2018-11-11 MED ORDER — ENOXAPARIN SODIUM 40 MG/0.4ML ~~LOC~~ SOLN
40.00 | SUBCUTANEOUS | Status: DC
Start: 2018-11-11 — End: 2018-11-11

## 2018-11-11 MED ORDER — SODIUM CHLORIDE 0.9 % IV SOLN
10.00 | INTRAVENOUS | Status: DC
Start: ? — End: 2018-11-11

## 2018-11-11 MED ORDER — ASPIRIN EC 81 MG PO TBEC
81.00 | DELAYED_RELEASE_TABLET | ORAL | Status: DC
Start: 2018-11-11 — End: 2018-11-11

## 2018-11-11 MED ORDER — VITAMIN D3 25 MCG (1000 UT) PO TABS
4000.00 | ORAL_TABLET | ORAL | Status: DC
Start: 2018-11-11 — End: 2018-11-11

## 2018-11-12 DIAGNOSIS — D696 Thrombocytopenia, unspecified: Secondary | ICD-10-CM | POA: Diagnosis not present

## 2018-11-12 DIAGNOSIS — N183 Chronic kidney disease, stage 3 (moderate): Secondary | ICD-10-CM | POA: Diagnosis not present

## 2018-11-12 DIAGNOSIS — M1009 Idiopathic gout, multiple sites: Secondary | ICD-10-CM | POA: Diagnosis not present

## 2018-11-12 DIAGNOSIS — I441 Atrioventricular block, second degree: Secondary | ICD-10-CM | POA: Diagnosis not present

## 2018-11-12 DIAGNOSIS — I6789 Other cerebrovascular disease: Secondary | ICD-10-CM | POA: Diagnosis not present

## 2018-11-12 DIAGNOSIS — E860 Dehydration: Secondary | ICD-10-CM | POA: Diagnosis not present

## 2018-11-12 DIAGNOSIS — I129 Hypertensive chronic kidney disease with stage 1 through stage 4 chronic kidney disease, or unspecified chronic kidney disease: Secondary | ICD-10-CM | POA: Diagnosis not present

## 2018-11-12 DIAGNOSIS — I2510 Atherosclerotic heart disease of native coronary artery without angina pectoris: Secondary | ICD-10-CM | POA: Diagnosis not present

## 2018-11-12 DIAGNOSIS — G3189 Other specified degenerative diseases of nervous system: Secondary | ICD-10-CM | POA: Diagnosis not present

## 2018-11-12 DIAGNOSIS — E785 Hyperlipidemia, unspecified: Secondary | ICD-10-CM | POA: Diagnosis not present

## 2018-11-12 DIAGNOSIS — F419 Anxiety disorder, unspecified: Secondary | ICD-10-CM | POA: Diagnosis not present

## 2018-11-12 DIAGNOSIS — M5136 Other intervertebral disc degeneration, lumbar region: Secondary | ICD-10-CM | POA: Diagnosis not present

## 2018-11-14 DIAGNOSIS — E860 Dehydration: Secondary | ICD-10-CM | POA: Diagnosis not present

## 2018-11-14 DIAGNOSIS — I2510 Atherosclerotic heart disease of native coronary artery without angina pectoris: Secondary | ICD-10-CM | POA: Diagnosis not present

## 2018-11-14 DIAGNOSIS — D696 Thrombocytopenia, unspecified: Secondary | ICD-10-CM | POA: Diagnosis not present

## 2018-11-14 DIAGNOSIS — M5136 Other intervertebral disc degeneration, lumbar region: Secondary | ICD-10-CM | POA: Diagnosis not present

## 2018-11-14 DIAGNOSIS — G3189 Other specified degenerative diseases of nervous system: Secondary | ICD-10-CM | POA: Diagnosis not present

## 2018-11-14 DIAGNOSIS — I441 Atrioventricular block, second degree: Secondary | ICD-10-CM | POA: Diagnosis not present

## 2018-11-15 DIAGNOSIS — D696 Thrombocytopenia, unspecified: Secondary | ICD-10-CM | POA: Diagnosis not present

## 2018-11-15 DIAGNOSIS — M5136 Other intervertebral disc degeneration, lumbar region: Secondary | ICD-10-CM | POA: Diagnosis not present

## 2018-11-15 DIAGNOSIS — G3189 Other specified degenerative diseases of nervous system: Secondary | ICD-10-CM | POA: Diagnosis not present

## 2018-11-15 DIAGNOSIS — I441 Atrioventricular block, second degree: Secondary | ICD-10-CM | POA: Diagnosis not present

## 2018-11-15 DIAGNOSIS — E860 Dehydration: Secondary | ICD-10-CM | POA: Diagnosis not present

## 2018-11-15 DIAGNOSIS — I2510 Atherosclerotic heart disease of native coronary artery without angina pectoris: Secondary | ICD-10-CM | POA: Diagnosis not present

## 2018-11-19 DIAGNOSIS — E860 Dehydration: Secondary | ICD-10-CM | POA: Diagnosis not present

## 2018-11-19 DIAGNOSIS — D696 Thrombocytopenia, unspecified: Secondary | ICD-10-CM | POA: Diagnosis not present

## 2018-11-19 DIAGNOSIS — I441 Atrioventricular block, second degree: Secondary | ICD-10-CM | POA: Diagnosis not present

## 2018-11-19 DIAGNOSIS — I2510 Atherosclerotic heart disease of native coronary artery without angina pectoris: Secondary | ICD-10-CM | POA: Diagnosis not present

## 2018-11-19 DIAGNOSIS — G3189 Other specified degenerative diseases of nervous system: Secondary | ICD-10-CM | POA: Diagnosis not present

## 2018-11-19 DIAGNOSIS — M5136 Other intervertebral disc degeneration, lumbar region: Secondary | ICD-10-CM | POA: Diagnosis not present

## 2018-11-21 DIAGNOSIS — D696 Thrombocytopenia, unspecified: Secondary | ICD-10-CM | POA: Diagnosis not present

## 2018-11-21 DIAGNOSIS — I2510 Atherosclerotic heart disease of native coronary artery without angina pectoris: Secondary | ICD-10-CM | POA: Diagnosis not present

## 2018-11-21 DIAGNOSIS — I441 Atrioventricular block, second degree: Secondary | ICD-10-CM | POA: Diagnosis not present

## 2018-11-21 DIAGNOSIS — E860 Dehydration: Secondary | ICD-10-CM | POA: Diagnosis not present

## 2018-11-21 DIAGNOSIS — G3189 Other specified degenerative diseases of nervous system: Secondary | ICD-10-CM | POA: Diagnosis not present

## 2018-11-21 DIAGNOSIS — M5136 Other intervertebral disc degeneration, lumbar region: Secondary | ICD-10-CM | POA: Diagnosis not present

## 2018-11-22 DIAGNOSIS — E860 Dehydration: Secondary | ICD-10-CM | POA: Diagnosis not present

## 2018-11-22 DIAGNOSIS — D696 Thrombocytopenia, unspecified: Secondary | ICD-10-CM | POA: Diagnosis not present

## 2018-11-22 DIAGNOSIS — M5136 Other intervertebral disc degeneration, lumbar region: Secondary | ICD-10-CM | POA: Diagnosis not present

## 2018-11-22 DIAGNOSIS — G3189 Other specified degenerative diseases of nervous system: Secondary | ICD-10-CM | POA: Diagnosis not present

## 2018-11-22 DIAGNOSIS — I441 Atrioventricular block, second degree: Secondary | ICD-10-CM | POA: Diagnosis not present

## 2018-11-22 DIAGNOSIS — I2510 Atherosclerotic heart disease of native coronary artery without angina pectoris: Secondary | ICD-10-CM | POA: Diagnosis not present

## 2018-11-26 ENCOUNTER — Other Ambulatory Visit: Payer: Self-pay | Admitting: Internal Medicine

## 2018-11-26 DIAGNOSIS — G214 Vascular parkinsonism: Secondary | ICD-10-CM

## 2018-11-26 DIAGNOSIS — E860 Dehydration: Secondary | ICD-10-CM | POA: Diagnosis not present

## 2018-11-26 DIAGNOSIS — I2510 Atherosclerotic heart disease of native coronary artery without angina pectoris: Secondary | ICD-10-CM | POA: Diagnosis not present

## 2018-11-26 DIAGNOSIS — I441 Atrioventricular block, second degree: Secondary | ICD-10-CM | POA: Diagnosis not present

## 2018-11-26 DIAGNOSIS — D696 Thrombocytopenia, unspecified: Secondary | ICD-10-CM | POA: Diagnosis not present

## 2018-11-26 DIAGNOSIS — M5136 Other intervertebral disc degeneration, lumbar region: Secondary | ICD-10-CM | POA: Diagnosis not present

## 2018-11-26 DIAGNOSIS — G3189 Other specified degenerative diseases of nervous system: Secondary | ICD-10-CM | POA: Diagnosis not present

## 2018-11-29 DIAGNOSIS — M5136 Other intervertebral disc degeneration, lumbar region: Secondary | ICD-10-CM | POA: Diagnosis not present

## 2018-11-29 DIAGNOSIS — I2510 Atherosclerotic heart disease of native coronary artery without angina pectoris: Secondary | ICD-10-CM | POA: Diagnosis not present

## 2018-11-29 DIAGNOSIS — I441 Atrioventricular block, second degree: Secondary | ICD-10-CM | POA: Diagnosis not present

## 2018-11-29 DIAGNOSIS — G3189 Other specified degenerative diseases of nervous system: Secondary | ICD-10-CM | POA: Diagnosis not present

## 2018-11-29 DIAGNOSIS — D696 Thrombocytopenia, unspecified: Secondary | ICD-10-CM | POA: Diagnosis not present

## 2018-11-29 DIAGNOSIS — E860 Dehydration: Secondary | ICD-10-CM | POA: Diagnosis not present

## 2018-11-30 DIAGNOSIS — G3189 Other specified degenerative diseases of nervous system: Secondary | ICD-10-CM | POA: Diagnosis not present

## 2018-11-30 DIAGNOSIS — E860 Dehydration: Secondary | ICD-10-CM | POA: Diagnosis not present

## 2018-11-30 DIAGNOSIS — D696 Thrombocytopenia, unspecified: Secondary | ICD-10-CM | POA: Diagnosis not present

## 2018-11-30 DIAGNOSIS — I2510 Atherosclerotic heart disease of native coronary artery without angina pectoris: Secondary | ICD-10-CM | POA: Diagnosis not present

## 2018-11-30 DIAGNOSIS — M5136 Other intervertebral disc degeneration, lumbar region: Secondary | ICD-10-CM | POA: Diagnosis not present

## 2018-11-30 DIAGNOSIS — I441 Atrioventricular block, second degree: Secondary | ICD-10-CM | POA: Diagnosis not present

## 2018-11-30 NOTE — Progress Notes (Deleted)
Virtual Visit via Video Note The purpose of this virtual visit is to provide medical care while limiting exposure to the novel coronavirus.    Consent was obtained for video visit:  {yes no:314532} Answered questions that patient had about telehealth interaction:  {yes no:314532} I discussed the limitations, risks, security and privacy concerns of performing an evaluation and management service by telemedicine. I also discussed with the patient that there may be a patient responsible charge related to this service. The patient expressed understanding and agreed to proceed.  Pt location: Home Physician Location: office Name of referring provider:  Lucky CowboyMcKeown, William, MD I connected with Brantley StageHeino W Maragh at patients initiation/request on 12/03/2018 at  1:00 PM EDT by video enabled telemedicine application and verified that I am speaking with the correct person using two identifiers. Pt MRN:  [id number] Pt DOB:  03/03/1946 Video Participants:  Brantley StageHeino W Cirrincione;  ***   History of Present Illness: *** Patient is seen today in follow-up for balance change.  I have not seen him for almost 2 years (last seen in June 2018).  He has had a levodopa challenge test in June, 2018 that was negative (levodopa of no value).  He has a history of documented essential tremor all the way back to at least 2008.  He has had a high-volume lumbar puncture which did not help his symptoms.  I thought that his biggest issues back then were structural issues with back pain and hip issues as well as peripheral neuropathy, which alcohol was a contributing factor.  Physical therapy was recommended for the balance, as well as discontinuing the alcohol.  Multiple medical records have been reviewed since our last visit.  This took 30 minutes of non-face-to-face time.  The [facility].  Patient had septic shock due to E. coli sepsis from cholangitis and had an ERCP performed and removed common bile  duct stones, and ultimately had a cholecystectomy.  [facility] and was admitted on April 2 for gait change and weakness.  He had multiple falls.  They noted a "festinating gait that was not wide-based."  CT of the brain was performed.  I only have the report and not the films.  There was reported to be advanced central and cortical atrophy.  "Hydrocephalus is not excluded.  Appearance of ventricles is similar to prior examination."  Recommendation was made for inpatient rehab, but the patient ultimately declined and decided on home health instead.  Today, he states that ***.  He is currently drinking ***.    Past Medical History:  Diagnosis Date   CAD (coronary artery disease)    Essential tremor    Gastric ulcer 01/14/2015   Gout    Hyperlipidemia    Hypertension    Parkinson's disease (HCC)    Prediabetes    Proteinuria    Vitamin D deficiency       Observations/Objective:   There were no vitals filed for this visit. GEN:  The patient appears stated age and is in NAD.  Neurological examination:  Orientation: The patient is alert and oriented x3. Cranial nerves: There is good facial symmetry. There is ***facial hypomimia.  The speech is fluent and clear. Soft palate rises symmetrically and there is no tongue deviation. Hearing is intact to conversational tone. Motor: Strength is at least antigravity x 4.   Shoulder shrug is equal and symmetric.  There is no pronator drift.  Movement examination: Tone: unable  Abnormal movements: *** Coordination:  There is *** decremation with RAM's, *** Gait and Station: The patient has *** difficulty arising out of a deep-seated chair without the use of the hands. The patient's stride length is ***.      Assessment and Plan:   1.  Balance Change             -no evidence of PD.  Does not meet clinical criteria for PD.             -talked to him about his MRI brain.  While brain does have some atrophy, not sure that this is  in proportion to degree of ventricular enlargement.  Disagree with radiology interpretation in that regard.  However, high volume LP did not help sx's             -levodopa challenge done on 01/17/17 and demonstrated levodopa is of no value.             -He does have a long hx of ET, documented by Dr. Sandria Manly all the way back in 2008.               -talked about DaT scan.  Not sure that it would add much but offered to patient.  He declined.             -I think that patients biggest issues with balance are structural with back pain and hip issues.  He also has peripheral neuropathy which likely contributes.  Decreasing EtOH could be of value.  Will refer to PT and sports med.              Follow Up Instructions:    -I discussed the assessment and treatment plan with the patient. The patient was provided an opportunity to ask questions and all were answered. The patient agreed with the plan and demonstrated an understanding of the instructions.   The patient was advised to call back or seek an in-person evaluation if the symptoms worsen or if the condition fails to improve as anticipated.    Total Time spent in visit with the patient was:  ***, of which more than 50% of the time was spent in counseling and/or coordinating care on ***.   Pt understands and agrees with the plan of care outlined.  ***This did not include the 30 min of record review which was detailed above, which was non face to face time.    Kerin Salen, DO

## 2018-12-03 ENCOUNTER — Telehealth: Payer: Medicare Other | Admitting: Neurology

## 2018-12-04 DIAGNOSIS — I2510 Atherosclerotic heart disease of native coronary artery without angina pectoris: Secondary | ICD-10-CM | POA: Diagnosis not present

## 2018-12-04 DIAGNOSIS — D696 Thrombocytopenia, unspecified: Secondary | ICD-10-CM | POA: Diagnosis not present

## 2018-12-04 DIAGNOSIS — G3189 Other specified degenerative diseases of nervous system: Secondary | ICD-10-CM | POA: Diagnosis not present

## 2018-12-04 DIAGNOSIS — M5136 Other intervertebral disc degeneration, lumbar region: Secondary | ICD-10-CM | POA: Diagnosis not present

## 2018-12-04 DIAGNOSIS — I441 Atrioventricular block, second degree: Secondary | ICD-10-CM | POA: Diagnosis not present

## 2018-12-04 DIAGNOSIS — E860 Dehydration: Secondary | ICD-10-CM | POA: Diagnosis not present

## 2018-12-05 DIAGNOSIS — M5136 Other intervertebral disc degeneration, lumbar region: Secondary | ICD-10-CM | POA: Diagnosis not present

## 2018-12-05 DIAGNOSIS — G3189 Other specified degenerative diseases of nervous system: Secondary | ICD-10-CM | POA: Diagnosis not present

## 2018-12-05 DIAGNOSIS — D696 Thrombocytopenia, unspecified: Secondary | ICD-10-CM | POA: Diagnosis not present

## 2018-12-05 DIAGNOSIS — I441 Atrioventricular block, second degree: Secondary | ICD-10-CM | POA: Diagnosis not present

## 2018-12-05 DIAGNOSIS — E860 Dehydration: Secondary | ICD-10-CM | POA: Diagnosis not present

## 2018-12-05 DIAGNOSIS — I2510 Atherosclerotic heart disease of native coronary artery without angina pectoris: Secondary | ICD-10-CM | POA: Diagnosis not present

## 2018-12-10 ENCOUNTER — Encounter: Payer: Self-pay | Admitting: *Deleted

## 2018-12-10 ENCOUNTER — Telehealth: Payer: Self-pay | Admitting: Neurology

## 2018-12-10 NOTE — Telephone Encounter (Signed)
Left 2 messages for patient to set up a time to administer a MoCA.

## 2018-12-10 NOTE — Progress Notes (Signed)
Virtual Visit via Video Note The purpose of this virtual visit is to provide medical care while limiting exposure to the novel coronavirus.    Consent was obtained for video visit:  Yes.   Answered questions that patient had about telehealth interaction:  Yes.   I discussed the limitations, risks, security and privacy concerns of performing an evaluation and management service by telemedicine. I also discussed with the patient that there may be a patient responsible charge related to this service. The patient expressed understanding and agreed to proceed.  Pt location: Home Physician Location: office Name of referring provider:  Lucky CowboyMcKeown, William, MD I connected with Brantley StageHeino W Skluzacek at patients initiation/request on 12/11/2018 at  1:30 PM EDT by video enabled telemedicine application and verified that I am speaking with the correct person using two identifiers. Pt MRN:  [id number] Pt DOB:  02/01/1946 Video Participants:  Brantley StageHeino W Fullilove;  wife supplements history   History of Present Illness:  Patient is seen today in follow-up for balance change.  I have not seen him for almost 2 years (last seen in June 2018).  He has had a levodopa challenge test in June, 2018 that was negative (levodopa of no value).  He has a history of documented essential tremor all the way back to at least 2008.  He has had a high-volume lumbar puncture which did not help his symptoms.  I thought that his biggest issues back then were structural issues with back pain and hip issues as well as peripheral neuropathy, which alcohol was a contributing factor.  Physical therapy was recommended for the balance, as well as discontinuing the alcohol.  Multiple medical records have been reviewed since our last visit.  This took 30 minutes of non-face-to-face time.  The patient was in the hospital in December at [facility].  Patient had septic shock due to E. coli sepsis from cholangitis and had an ERCP performed and removed common bile  duct stones, and ultimately had a cholecystectomy.  [facility] and was admitted on April 2 for gait change and weakness.  He had multiple falls.  Wife states that he had a fever of 100.4.  They noted a "festinating gait that was not wide-based."  CT of the brain was performed.  I only have the report and not the films.  There was reported to be advanced central and cortical atrophy.  "Hydrocephalus is not excluded.  Appearance of ventricles is similar to prior examination."  Recommendation was made for inpatient rehab, but the patient ultimately declined and decided on home health instead.  Today, he states that he is doing therapy twice per week and has done that for 2-3 weeks.  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  Wife generally agrees with that statement.  She states that in December when he had the sepsis and in April, when he had a fever of 100.4, he had acute issues with walking, but otherwise his walking has been fairly stable.  He does think he has gotten better with physical therapy over the last 3 weeks.  He quit drinking EtOH in december  Past Medical History:  Diagnosis Date   CAD (coronary artery disease)    Essential tremor    Gastric ulcer 01/14/2015   Gout    Hyperlipidemia    Hypertension    Parkinson's disease (HCC)    Prediabetes    Proteinuria    Vitamin D deficiency       Observations/Objective:   There were no vitals filed for this visit. GEN:  The patient appears stated age and is in NAD.  Neurological examination:  Orientation: MoCA-BLIND done and patient received a 18/22. The patient is alert and oriented x3. Cranial nerves: There is good facial symmetry. There  is no facial hypomimia.  The speech is fluent and clear. Soft palate rises symmetrically and there is no tongue deviation. Hearing is intact to conversational tone. Motor: Strength is at least antigravity x 4.   Shoulder shrug is equal and symmetric.  There is no pronator drift.  Movement examination: Tone: unable Abnormal movements: No rest tremor is noted. Coordination:  There is no decremation with RAM's, with any form of RAMS, including alternating supination and pronation of the forearm, hand opening and closing, finger taps, heel taps and toe taps. Gait and Station: The patient pushes off of his chair to arise, but he is on a rolling chair.  The patient's stride length is short stepped.  He is wide based (without the walker).  With a walker, he is still short stepped, but is better balanced.    Assessment and Plan:   1.  Balance Change, potentially vascular parkinsonism without Parkinson's disease             -I still don't think that the patient has Parkinsons disease.  His examination has not changed markedly compared to his examination in 2018.  I did tell him that I cannot definitively say he does not have Parkinson's disease since I cannot examine his tone, but overall, he is not particularly bradykinetic and the only abnormality I see is in the walk, which is still fairly wide-based like it was several years ago (although his wife does state he is showing off today).  -We reviewed his prior testing.  We reviewed that his ventricles are somewhat enlarged, but a high volume lumbar puncture did not help symptoms and therefore shunting would not be expected to help sx's.  -We reviewed levodopa challenge test done in June, 2018 and demonstrated levodopa was of no value.  This again would suggest that he did not have Parkinson's disease, at least then.  -Discussed value of DaTscan.  Discussed that this test would at least give [place] some information about dopamine in the brain.  He declined that  test right now, stating that he was really getting better with physical therapy.  Told the patient that if he did physical therapy consistently, then he likely would do better.  Once physical therapy has ended, he needs to continue the exercises that they give him.            Follow Up Instructions:  Patient was instructed to call me if he does not continue to get better with physical therapy and then we could pursue the DaTscan.  He does not wish to follow-up on a regular basis.  -I discussed the assessment and treatment plan with the patient. The patient was provided an opportunity to ask questions and all were answered. The patient agreed with the plan and demonstrated an understanding of the instructions.   The patient was advised to call back or seek an in-person  evaluation if the symptoms worsen or if the condition fails to improve as anticipated.    Total Time spent in visit with the patient was:  45 min, of which more than 50% of the time was spent in counseling and/or coordinating care on safety and diagnosis.   Pt understands and agrees with the plan of care outlined.  This did not include the 30 min of record review which was detailed above, which was non face to face time.    Kerin Salen, DO

## 2018-12-11 ENCOUNTER — Encounter: Payer: Self-pay | Admitting: Neurology

## 2018-12-11 ENCOUNTER — Other Ambulatory Visit: Payer: Self-pay

## 2018-12-11 ENCOUNTER — Telehealth (INDEPENDENT_AMBULATORY_CARE_PROVIDER_SITE_OTHER): Payer: Medicare Other | Admitting: Neurology

## 2018-12-11 DIAGNOSIS — G214 Vascular parkinsonism: Secondary | ICD-10-CM

## 2018-12-11 DIAGNOSIS — R9402 Abnormal brain scan: Secondary | ICD-10-CM | POA: Diagnosis not present

## 2018-12-12 DIAGNOSIS — E860 Dehydration: Secondary | ICD-10-CM | POA: Diagnosis not present

## 2018-12-12 DIAGNOSIS — N183 Chronic kidney disease, stage 3 (moderate): Secondary | ICD-10-CM | POA: Diagnosis not present

## 2018-12-12 DIAGNOSIS — R351 Nocturia: Secondary | ICD-10-CM | POA: Diagnosis not present

## 2018-12-12 DIAGNOSIS — I6789 Other cerebrovascular disease: Secondary | ICD-10-CM | POA: Diagnosis not present

## 2018-12-12 DIAGNOSIS — E785 Hyperlipidemia, unspecified: Secondary | ICD-10-CM | POA: Diagnosis not present

## 2018-12-12 DIAGNOSIS — I2510 Atherosclerotic heart disease of native coronary artery without angina pectoris: Secondary | ICD-10-CM | POA: Diagnosis not present

## 2018-12-12 DIAGNOSIS — G3189 Other specified degenerative diseases of nervous system: Secondary | ICD-10-CM | POA: Diagnosis not present

## 2018-12-12 DIAGNOSIS — I129 Hypertensive chronic kidney disease with stage 1 through stage 4 chronic kidney disease, or unspecified chronic kidney disease: Secondary | ICD-10-CM | POA: Diagnosis not present

## 2018-12-12 DIAGNOSIS — M1009 Idiopathic gout, multiple sites: Secondary | ICD-10-CM | POA: Diagnosis not present

## 2018-12-12 DIAGNOSIS — D696 Thrombocytopenia, unspecified: Secondary | ICD-10-CM | POA: Diagnosis not present

## 2018-12-12 DIAGNOSIS — F419 Anxiety disorder, unspecified: Secondary | ICD-10-CM | POA: Diagnosis not present

## 2018-12-12 DIAGNOSIS — M5136 Other intervertebral disc degeneration, lumbar region: Secondary | ICD-10-CM | POA: Diagnosis not present

## 2018-12-12 DIAGNOSIS — R3915 Urgency of urination: Secondary | ICD-10-CM | POA: Diagnosis not present

## 2018-12-12 DIAGNOSIS — I441 Atrioventricular block, second degree: Secondary | ICD-10-CM | POA: Diagnosis not present

## 2018-12-14 DIAGNOSIS — D696 Thrombocytopenia, unspecified: Secondary | ICD-10-CM | POA: Diagnosis not present

## 2018-12-14 DIAGNOSIS — G3189 Other specified degenerative diseases of nervous system: Secondary | ICD-10-CM | POA: Diagnosis not present

## 2018-12-14 DIAGNOSIS — I2510 Atherosclerotic heart disease of native coronary artery without angina pectoris: Secondary | ICD-10-CM | POA: Diagnosis not present

## 2018-12-14 DIAGNOSIS — I441 Atrioventricular block, second degree: Secondary | ICD-10-CM | POA: Diagnosis not present

## 2018-12-14 DIAGNOSIS — E860 Dehydration: Secondary | ICD-10-CM | POA: Diagnosis not present

## 2018-12-14 DIAGNOSIS — M5136 Other intervertebral disc degeneration, lumbar region: Secondary | ICD-10-CM | POA: Diagnosis not present

## 2018-12-17 DIAGNOSIS — I441 Atrioventricular block, second degree: Secondary | ICD-10-CM | POA: Diagnosis not present

## 2018-12-17 DIAGNOSIS — D696 Thrombocytopenia, unspecified: Secondary | ICD-10-CM | POA: Diagnosis not present

## 2018-12-17 DIAGNOSIS — G3189 Other specified degenerative diseases of nervous system: Secondary | ICD-10-CM | POA: Diagnosis not present

## 2018-12-17 DIAGNOSIS — M5136 Other intervertebral disc degeneration, lumbar region: Secondary | ICD-10-CM | POA: Diagnosis not present

## 2018-12-17 DIAGNOSIS — I2510 Atherosclerotic heart disease of native coronary artery without angina pectoris: Secondary | ICD-10-CM | POA: Diagnosis not present

## 2018-12-17 DIAGNOSIS — E860 Dehydration: Secondary | ICD-10-CM | POA: Diagnosis not present

## 2018-12-18 DIAGNOSIS — M5136 Other intervertebral disc degeneration, lumbar region: Secondary | ICD-10-CM | POA: Diagnosis not present

## 2018-12-18 DIAGNOSIS — D696 Thrombocytopenia, unspecified: Secondary | ICD-10-CM | POA: Diagnosis not present

## 2018-12-18 DIAGNOSIS — I2510 Atherosclerotic heart disease of native coronary artery without angina pectoris: Secondary | ICD-10-CM | POA: Diagnosis not present

## 2018-12-18 DIAGNOSIS — E860 Dehydration: Secondary | ICD-10-CM | POA: Diagnosis not present

## 2018-12-18 DIAGNOSIS — I441 Atrioventricular block, second degree: Secondary | ICD-10-CM | POA: Diagnosis not present

## 2018-12-18 DIAGNOSIS — G3189 Other specified degenerative diseases of nervous system: Secondary | ICD-10-CM | POA: Diagnosis not present

## 2018-12-19 DIAGNOSIS — R3915 Urgency of urination: Secondary | ICD-10-CM | POA: Diagnosis not present

## 2018-12-19 DIAGNOSIS — R351 Nocturia: Secondary | ICD-10-CM | POA: Diagnosis not present

## 2018-12-21 DIAGNOSIS — I2510 Atherosclerotic heart disease of native coronary artery without angina pectoris: Secondary | ICD-10-CM | POA: Diagnosis not present

## 2018-12-21 DIAGNOSIS — M5136 Other intervertebral disc degeneration, lumbar region: Secondary | ICD-10-CM | POA: Diagnosis not present

## 2018-12-21 DIAGNOSIS — E860 Dehydration: Secondary | ICD-10-CM | POA: Diagnosis not present

## 2018-12-21 DIAGNOSIS — I441 Atrioventricular block, second degree: Secondary | ICD-10-CM | POA: Diagnosis not present

## 2018-12-21 DIAGNOSIS — G3189 Other specified degenerative diseases of nervous system: Secondary | ICD-10-CM | POA: Diagnosis not present

## 2018-12-21 DIAGNOSIS — D696 Thrombocytopenia, unspecified: Secondary | ICD-10-CM | POA: Diagnosis not present

## 2018-12-24 DIAGNOSIS — I441 Atrioventricular block, second degree: Secondary | ICD-10-CM | POA: Diagnosis not present

## 2018-12-24 DIAGNOSIS — I2510 Atherosclerotic heart disease of native coronary artery without angina pectoris: Secondary | ICD-10-CM | POA: Diagnosis not present

## 2018-12-24 DIAGNOSIS — D696 Thrombocytopenia, unspecified: Secondary | ICD-10-CM | POA: Diagnosis not present

## 2018-12-24 DIAGNOSIS — M5136 Other intervertebral disc degeneration, lumbar region: Secondary | ICD-10-CM | POA: Diagnosis not present

## 2018-12-24 DIAGNOSIS — E860 Dehydration: Secondary | ICD-10-CM | POA: Diagnosis not present

## 2018-12-24 DIAGNOSIS — G3189 Other specified degenerative diseases of nervous system: Secondary | ICD-10-CM | POA: Diagnosis not present

## 2018-12-25 DIAGNOSIS — D696 Thrombocytopenia, unspecified: Secondary | ICD-10-CM | POA: Diagnosis not present

## 2018-12-25 DIAGNOSIS — G3189 Other specified degenerative diseases of nervous system: Secondary | ICD-10-CM | POA: Diagnosis not present

## 2018-12-25 DIAGNOSIS — M5136 Other intervertebral disc degeneration, lumbar region: Secondary | ICD-10-CM | POA: Diagnosis not present

## 2018-12-25 DIAGNOSIS — I2510 Atherosclerotic heart disease of native coronary artery without angina pectoris: Secondary | ICD-10-CM | POA: Diagnosis not present

## 2018-12-25 DIAGNOSIS — E860 Dehydration: Secondary | ICD-10-CM | POA: Diagnosis not present

## 2018-12-25 DIAGNOSIS — I441 Atrioventricular block, second degree: Secondary | ICD-10-CM | POA: Diagnosis not present

## 2018-12-27 DIAGNOSIS — R351 Nocturia: Secondary | ICD-10-CM | POA: Diagnosis not present

## 2018-12-27 DIAGNOSIS — N311 Reflex neuropathic bladder, not elsewhere classified: Secondary | ICD-10-CM | POA: Diagnosis not present

## 2018-12-28 DIAGNOSIS — I2510 Atherosclerotic heart disease of native coronary artery without angina pectoris: Secondary | ICD-10-CM | POA: Diagnosis not present

## 2018-12-28 DIAGNOSIS — M5136 Other intervertebral disc degeneration, lumbar region: Secondary | ICD-10-CM | POA: Diagnosis not present

## 2018-12-28 DIAGNOSIS — I441 Atrioventricular block, second degree: Secondary | ICD-10-CM | POA: Diagnosis not present

## 2018-12-28 DIAGNOSIS — D696 Thrombocytopenia, unspecified: Secondary | ICD-10-CM | POA: Diagnosis not present

## 2018-12-28 DIAGNOSIS — E860 Dehydration: Secondary | ICD-10-CM | POA: Diagnosis not present

## 2018-12-28 DIAGNOSIS — G3189 Other specified degenerative diseases of nervous system: Secondary | ICD-10-CM | POA: Diagnosis not present

## 2018-12-31 DIAGNOSIS — M5136 Other intervertebral disc degeneration, lumbar region: Secondary | ICD-10-CM | POA: Diagnosis not present

## 2018-12-31 DIAGNOSIS — G3189 Other specified degenerative diseases of nervous system: Secondary | ICD-10-CM | POA: Diagnosis not present

## 2018-12-31 DIAGNOSIS — D696 Thrombocytopenia, unspecified: Secondary | ICD-10-CM | POA: Diagnosis not present

## 2018-12-31 DIAGNOSIS — E860 Dehydration: Secondary | ICD-10-CM | POA: Diagnosis not present

## 2018-12-31 DIAGNOSIS — I2510 Atherosclerotic heart disease of native coronary artery without angina pectoris: Secondary | ICD-10-CM | POA: Diagnosis not present

## 2018-12-31 DIAGNOSIS — I441 Atrioventricular block, second degree: Secondary | ICD-10-CM | POA: Diagnosis not present

## 2019-01-03 DIAGNOSIS — E860 Dehydration: Secondary | ICD-10-CM | POA: Diagnosis not present

## 2019-01-03 DIAGNOSIS — I441 Atrioventricular block, second degree: Secondary | ICD-10-CM | POA: Diagnosis not present

## 2019-01-03 DIAGNOSIS — D696 Thrombocytopenia, unspecified: Secondary | ICD-10-CM | POA: Diagnosis not present

## 2019-01-03 DIAGNOSIS — I2510 Atherosclerotic heart disease of native coronary artery without angina pectoris: Secondary | ICD-10-CM | POA: Diagnosis not present

## 2019-01-03 DIAGNOSIS — G3189 Other specified degenerative diseases of nervous system: Secondary | ICD-10-CM | POA: Diagnosis not present

## 2019-01-03 DIAGNOSIS — M5136 Other intervertebral disc degeneration, lumbar region: Secondary | ICD-10-CM | POA: Diagnosis not present

## 2019-01-16 NOTE — Progress Notes (Signed)
MEDICARE ANNUAL WELLNESS VISIT AND FOLLOW UP Assessment:   Diagnoses and all orders for this visit:  Encounter for Medicare annual wellness exam  Sinus pause   With blocked PACs, PVCs and sinus arrhythmia Rate and rhythm regular today, continue BB, followed by cardiology  Essential hypertension Continue medications Monitor blood pressure at home; call if consistently over 130/80 Continue DASH diet.   Reminder to go to the ER if any CP, SOB, nausea, dizziness, severe HA, changes vision/speech, left arm numbness and tingling and jaw pain.  Essential tremor/abnormal gait and mobility Poorly explained episodic; walking with walker at this time; Parkinson's ruled out; followed by Dr. Carles Collet Recently underwent "LSVT" therapy following hospitalization and found this to be very helpful, would like to continue. Patient will reach out to Dr. Carles Collet for her to order and monitor this.   Vitamin D deficiency At goal at recent check; continue to recommend supplementation for goal of 70-100 Defer vitamin D level  Other abnormal glucose Recent A1Cs at goal Discussed diet/exercise, weight management  Defer A1C; check CMP  Overweight (BMI 25.0-29.9) Long discussion about weight loss, diet, and exercise Recommended diet heavy in fruits and veggies and low in animal meats, cheeses, and dairy products, appropriate calorie intake Discussed appropriate weight for height  Continue heart healthy diet, daily exercise Follow up at next visit  Medication management CBC, CMP/GFR  Hyperlipidemia Continue medications Continue low cholesterol diet and exercise.  Check lipid panel.   Idiopathic gout, unspecified chronicity, unspecified site Continue allopurinol Diet discussed Check uric acid as needed  Proteinuria, unspecified type Followed by Dr. Moshe Cipro  Overactive bladder Severe, limiting quality of life Manged by Dr. Jeffie Pollock, some spotty benefit with ER oxybutynin Has close follow up  scheduled, discussing botox   Coronary artery disease involving native coronary artery of native heart, angina presence unspecified Control blood pressure, cholesterol, glucose, increase exercise.  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 Jeffie Pollock sooner if needed, therapy via Dr. Hall Busing Wife agrees to monitor closely, call/follow up or present to ED if acutely worse   Future Appointments  Date Time Provider [place]  04/23/2019  2:00 PM Deboraha Sprang, MD [facility]  07/23/2019  2:00 PM Unk Pinto, MD [facility] None  01/22/2020 11:15 AM Liane Comber, NP [facility] None     Plan:   During the course of the visit the patient was educated and counseled about appropriate screening and preventive services including:    Pneumococcal vaccine   Hepatitis B vaccine  Td vaccine  Screening electrocardiogram  Colorectal cancer screening  Diabetes screening  Glaucoma screening  Nutrition counseling    Subjective:  Leroy Griffin is a 73 y.o. male who presents accompanied for Medicare Annual Wellness Visit and 3 month follow up for HTN, hyperlipidemia, prediabetes, and vitamin D Def.   He had sepsis consequent of obstructed bile duct and cholecystitis in 07/2018 and reports recovery has been slow, has completed home therapy, also reports he underwent LSVT BIG treatment that he finished up a few weeks ago, but reports he found this very beneficial and would like referral back to continue.   Patient has also been evaluated by  Dr Tat for  Tremor - presumed Hereditary or Essential and she ruled out Parkinson's. Because of gait difficulty, he also had high volume LP w/o any improvement in his gait. He is also now followed by Dr. Otelia Sergeant for back pain and bilateral knee pain. He has hx of ankylosing spondylitis with bone spurs remotely, discussed surgery but ultimately declined. Has valium PRN.    Patient had negative screening stress test and Cardiac MRI in Feb 2017 per Dr Graciela Husbands, however demonstrated unstable angina recently in 11/2017 and underwent diagnostic heart cath by Dr. Satira Sark. Cair which demonstrated high-grade stenosis of distal LAD (85-90%) and diagonal (90%) - stent was placed to LAD with balloon inflation of diagonal with improvement to 50%. He is on brilinta and bASA. He has follow up with Dr. Neale Burly and Dr. Graciela Husbands.   Has urinary frequency, nocturia x 5-8, very rare incontinence if he waits too long. Occ hesitancy, dribbling, weak stream. - has seen Dr. Annabell Howells in recent weeks, reports normal prostate, has small bladder and OAB, has been restarted on ER oxybutynin with "spotty" benefit per patient. He did try myrbetriq without benefit. Has upcoming follow up appointment later this month, discussing possible botox, cath.   BMI is Body mass index is 26.85 kg/m., he has been working on diet and exercise. Wife is strictly watching his diet for heart healthy diet and portion control.  Wt Readings from Last 3 Encounters:  01/17/19 198 lb (89.8 kg)  10/15/18 204 lb (92.5 kg)  07/11/18 217 lb 1.9 oz (98.5 kg)   His blood pressure has been controlled at home, today their BP is BP: 128/72 He denies chest pain, shortness of breath, dizziness.  He is on cholesterol medication (atorvastatin 40 mg daily) and denies myalgias. His cholesterol is at goal. The cholesterol last visit was:   Lab Results  Component Value Date   CHOL 131 10/15/2018   HDL 29 (L) 10/15/2018   LDLCALC 78 10/15/2018   TRIG 147 10/15/2018   CHOLHDL 4.5 10/15/2018    He has been working on diet and exercise for glucose management, and denies paresthesia of the feet, polydipsia and polyuria. Last A1C in the office was:  Lab Results  Component Value Date   HGBA1C 5.3 07/10/2018   Patient also has Idiopathic proteinuria & CKD2 followed by Dr Julien Girt.  Lab Results  Component Value Date   GFRNONAA 69 10/15/2018   Patient is on Vitamin D supplement.   Lab Results  Component Value Date   VD25OH 65 07/10/2018       Medication Review: Current Outpatient Medications on File Prior to Visit  Medication Sig Dispense Refill  . aspirin EC 81 MG tablet Take 81 mg by mouth daily.    Marland Kitchen atorvastatin (LIPITOR) 80 MG tablet TAKE 1 TABLET BY MOUTH EVERY DAY FOR CHOLESTEROL 90 tablet 3  . Cholecalciferol (VITAMIN D) 2000 units tablet Take 8,000 Units by mouth every morning.     . diazepam (VALIUM) 5 MG tablet Take 1 tablet 3 x  /day for Hereditary Tremor (Patient taking differently: Take 1 tablet at night for Hereditary Tremor) 90 tablet 2  . diclofenac sodium (VOLTAREN) 1 % GEL Apply 4 g topically as needed.    . isosorbide mononitrate (IMDUR) 30 MG 24 hr tablet TAKE 1 TABLET(30 MG) BY MOUTH DAILY    . MAGNESIUM PO Take 500 mg by mouth 2 (two) times daily.     . metoprolol succinate (TOPROL-XL) 25 MG 24 hr tablet Take 1  tablet by mouth daily.    . Minoxidil (ROGAINE MENS) 5 % FOAM Apply 1 application topically daily.    . nitroGLYCERIN (NITROSTAT) 0.4 MG SL tablet Place 1 tablet under the tongue every 5 (five) minutes as needed. For chest pain    . Omega-3 Fatty Acids (FISH OIL) 1200 MG CPDR Take 2 capsules by mouth at bedtime.     Marland Kitchen. omeprazole (PRILOSEC) 20 MG capsule TAKE 1 CAPSULE BY MOUTH DAILY 90 capsule 0  . oxybutynin (DITROPAN-XL) 10 MG 24 hr tablet Take 10 mg by mouth at bedtime.    . traMADol (ULTRAM) 50 MG tablet TAKE 1 TABLET BY MOUTH EVERY 6 HOURS AS NEEDED 30 tablet 0  . triamcinolone ointment (KENALOG) 0.1 % Apply 1 application topically  3 (three) times daily. (Patient taking differently: Apply 1 application topically 2 (two) times daily as needed. ) 80 g 3  . vitamin B-12 (CYANOCOBALAMIN) 1000 MCG tablet Take 1,000 mcg by mouth daily.    Marland Kitchen. MYRBETRIQ 50 MG TB24 tablet TAKE 1 TABLET(50 MG) BY MOUTH DAILY 30 tablet 5  . ticagrelor (BRILINTA) 90 MG TABS tablet Take 1 tablet by mouth 2 (two) times daily.     No current facility-administered medications on file prior to visit.     Current Problems (verified) Patient Active Problem List   Diagnosis Date Noted  . Other abnormalities of gait and mobility 01/17/2019  . Depression due to physical illness 01/17/2019  . Overactive bladder 01/11/2018  . S/P drug eluting coronary stent placement 11/21/2017  . CAD in native artery 11/20/2017  . Anxiety 11/14/2017  . Overweight (BMI 25.0-29.9) 08/26/2014  . Sinus pause   With blocked PACs, PVCs and sinus arrhythmia 10/10/2013  . Medication management 10/06/2013  . Hyperlipidemia 08/27/2013  . Essential tremor   . Proteinuria   . Other abnormal glucose   . Gout   . Vitamin D deficiency   . Essential hypertension 10/10/2010    Screening Tests Immunization History  Administered Date(s) Administered  . Influenza Split 05/23/2013  . Influenza, High Dose Seasonal PF 05/14/2014, 06/11/2015, 05/19/2016  . Pneumococcal Conjugate-13 12/03/2014  . Pneumococcal Polysaccharide-23 06/01/2010  . Td 06/01/2010  . Tdap 01/28/2014  . Zoster 08/08/2004    Preventative care: Last colonoscopy: 2013 due 10 years EGD: 2019 Echo 08/2015 Stress test 09/2015 Cardiac cath 11/2017  Prior vaccinations: TD or Tdap: 2015 Influenza: 2019 at cardiology Pneumococcal: 2011 Prevnar 2016 Shingles/Zostavax: 2006   Names of Other Physician/Practitioners you currently use: 1. [facility] here for primary care 2. [facility], last visit 2019 3. Dr. Pollyann KennedyaylorDr/ [place] , dentist, last visit  2020, q 6 months  Patient Care Team: Lucky CowboyMcKeown, William, MD as PCP - General (Internal Medicine) Sharyn LullHaverstock, Elvin Sohristina L, MD as Referring Physician (Dermatology) Mittie BodoKlein, Jeffrey L, MD as Referring Physician (Cardiology) Annie SableGoldsborough, Kellie, MD as Consulting Physician (Nephrology) Marney SettingPeters, Randy, MD as Referring Physician (Gastroenterology)   Allergies Allergies  Allergen Reactions  . Enalapril Rash  . Aspirin     Internal bleeding  . Nsaids     Internal bleeding  . Welchol [Colesevelam Hcl] Diarrhea  . Vasotec [Enalaprilat] Rash    SURGICAL HISTORY He  has a past surgical history that includes Knee surgery (Right); Tonsillectomy; Cyst excision; and Cholecystectomy. FAMILY HISTORY His family history includes Dementia in his mother; Emphysema in his brother; Heart disease in his brother; Stroke in his father. SOCIAL HISTORY He  reports that he has never smoked. He  has never used smokeless tobacco. He reports previous alcohol use of about 14.0 standard drinks of alcohol per week. He reports that he does not use drugs.  MEDICARE WELLNESS OBJECTIVES: Physical activity: Current Exercise Habits: Home exercise routine, Type of exercise: strength training/weights;walking, Time (Minutes): 25, Frequency (Times/Week): 7, Weekly Exercise (Minutes/Week): 175, Intensity: Mild, Exercise limited by: neurologic condition(s) Cardiac risk factors: Cardiac Risk Factors include: advanced age (>1055men, 75>65 women);dyslipidemia;hypertension;male gender Depression/mood screen:   [facility] 01/17/2019  Decreased Interest 3  Down, Depressed, Hopeless 3  PHQ - 2 Score 6  Altered sleeping 1  Tired, decreased energy 2  Change in appetite 0  Feeling bad or failure about yourself  0  Trouble concentrating 1  Moving slowly or fidgety/restless 0  Suicidal thoughts 3  PHQ-9 Score 13  Difficult doing work/chores Very difficult    ADLs:  In your present state of health, do you have any difficulty  performing the following activities: 01/17/2019 07/10/2018  Hearing? N N  Vision? N N  Difficulty concentrating or making decisions? N N  Walking or climbing stairs? Y N  Dressing or bathing? N N  Doing errands, shopping? 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 N -  Using the Toilet? N -  In the past six months, have you accidently leaked urine? Y -  Do you have problems with loss of bowel control? N -  Managing your Medications? N -  Managing your Finances? N -  Housekeeping or managing your Housekeeping? N -  Some recent data might be hidden     Cognitive Testing  Alert? Yes  Normal Appearance?Yes  Oriented to person? Yes  Place? Yes   Time? Yes  Recall of three objects?  Yes  Can perform simple calculations? Yes  Displays appropriate judgment?Yes  Can read the correct time from a watch face?Yes  EOL planning: Does Patient Have a Medical Advance Directive?: Yes Type of Advance Directive: Healthcare Power of Attorney, Living will Does patient want to make changes to medical advance directive?: No - Patient declined Copy of Healthcare Power of Attorney in Chart?: Yes - validated most recent copy scanned in chart (See row information) Would patient like information on creating a medical advance directive?: No - Patient declined    Objective:   Blood pressure 128/72, pulse (!) 51, temperature (!) 97.5 F (36.4 C), height 6' (1.829 m), weight 198 lb (89.8 kg), SpO2 99 %. Body mass index is 26.85 kg/m.  General appearance: alert, no distress, WD/WN, male, Mask facies HEENT: normocephalic, sclerae anicteric, TMs pearly, nares patent, no discharge or erythema, pharynx normal Oral cavity: MMM, no lesions Neck: supple, no lymphadenopathy, no thyromegaly, no masses Heart: RRR, normal S1, S2, no murmurs Lungs: CTA bilaterally, no wheezes, rhonchi, or rales Abdomen: +bs, soft, non tender, non distended, no masses, no hepatomegaly, no  splenomegaly Musculoskeletal: nontender, no swelling, no obvious deformity Extremities: no edema, no cyanosis, no clubbing Pulses: 2+ symmetric, upper and lower extremities, normal cap refill Neurological: alert, oriented x 3, CN2-12 intact, strength normal upper extremities and lower extremities, , mild tremor of the right hand present, sensation normal throughout, DTRs 2+ throughout, no cerebellar signs, shuffling bent over gait with walker Psychiatric: normal affect, behavior normal, pleasant   Medicare Attestation I have personally reviewed: The patient's medical and social history Their use of alcohol, tobacco or illicit drugs Their current medications and supplements The patient's functional ability including ADLs,fall risks, home safety  risks, cognitive, and hearing and visual impairment Diet and physical activities Evidence for depression or mood disorders  The patient's weight, height, BMI, and visual acuity have been recorded in the chart.  I have made referrals, counseling, and provided education to the patient based on review of the above and I have provided the patient with a written personalized care plan for preventive services.     Dan MakerAshley C Beatriz Quintela, NP   01/17/2019

## 2019-01-17 ENCOUNTER — Ambulatory Visit (INDEPENDENT_AMBULATORY_CARE_PROVIDER_SITE_OTHER): Payer: Medicare Other | Admitting: Adult Health

## 2019-01-17 ENCOUNTER — Encounter: Payer: Self-pay | Admitting: Adult Health

## 2019-01-17 ENCOUNTER — Other Ambulatory Visit: Payer: Self-pay

## 2019-01-17 VITALS — BP 128/72 | HR 51 | Temp 97.5°F | Ht 72.0 in | Wt 198.0 lb

## 2019-01-17 DIAGNOSIS — E559 Vitamin D deficiency, unspecified: Secondary | ICD-10-CM

## 2019-01-17 DIAGNOSIS — F0631 Mood disorder due to known physiological condition with depressive features: Secondary | ICD-10-CM | POA: Insufficient documentation

## 2019-01-17 DIAGNOSIS — Z0001 Encounter for general adult medical examination with abnormal findings: Secondary | ICD-10-CM | POA: Diagnosis not present

## 2019-01-17 DIAGNOSIS — R809 Proteinuria, unspecified: Secondary | ICD-10-CM

## 2019-01-17 DIAGNOSIS — Z955 Presence of coronary angioplasty implant and graft: Secondary | ICD-10-CM

## 2019-01-17 DIAGNOSIS — E663 Overweight: Secondary | ICD-10-CM | POA: Diagnosis not present

## 2019-01-17 DIAGNOSIS — I10 Essential (primary) hypertension: Secondary | ICD-10-CM

## 2019-01-17 DIAGNOSIS — Z79899 Other long term (current) drug therapy: Secondary | ICD-10-CM

## 2019-01-17 DIAGNOSIS — R2689 Other abnormalities of gait and mobility: Secondary | ICD-10-CM | POA: Insufficient documentation

## 2019-01-17 DIAGNOSIS — R6889 Other general symptoms and signs: Secondary | ICD-10-CM | POA: Diagnosis not present

## 2019-01-17 DIAGNOSIS — E782 Mixed hyperlipidemia: Secondary | ICD-10-CM

## 2019-01-17 DIAGNOSIS — Z0000 Encounter for general adult medical examination without abnormal findings: Secondary | ICD-10-CM

## 2019-01-17 DIAGNOSIS — I455 Other specified heart block: Secondary | ICD-10-CM | POA: Diagnosis not present

## 2019-01-17 DIAGNOSIS — M1000 Idiopathic gout, unspecified site: Secondary | ICD-10-CM

## 2019-01-17 DIAGNOSIS — I2510 Atherosclerotic heart disease of native coronary artery without angina pectoris: Secondary | ICD-10-CM

## 2019-01-17 DIAGNOSIS — G250 Essential tremor: Secondary | ICD-10-CM

## 2019-01-17 DIAGNOSIS — R7309 Other abnormal glucose: Secondary | ICD-10-CM

## 2019-01-17 DIAGNOSIS — F419 Anxiety disorder, unspecified: Secondary | ICD-10-CM

## 2019-01-17 DIAGNOSIS — N3281 Overactive bladder: Secondary | ICD-10-CM

## 2019-01-17 NOTE — Patient Instructions (Addendum)
Leroy Griffin , Thank you for taking time to come for your Medicare Wellness Visit. I appreciate your ongoing commitment to your health goals. Please review the following plan we discussed and let me know if I can assist you in the future.   This is a list of the screening recommended for you and due dates:  Health Maintenance  Topic Date Due  .  Hepatitis C: One time screening is recommended by Center for Disease Control  (CDC) for  adults born from [birth year] through 1967.   01/17/2020*  . Flu Shot  03/09/2019  . Colon Cancer Screening  02/02/2022  . Tetanus Vaccine  01/29/2024  . Pneumonia vaccines  Completed  *Topic was postponed. The date shown is not the original due date.    Please bring copies of your advanced directive and living will for our records (medically relevant portions only)  Please reach out to Dr. Carles Collet to discuss LSVT treatment and benefit  Please keep close follow up with Dr. Jeffie Pollock for bladder - call for closer follow up if needed   Please call if mood is any worse or you reconsider starting medications   Major Depressive Disorder, Adult Major depressive disorder (MDD) is a mental health condition. It may also be called clinical depression or unipolar depression. MDD usually causes feelings of sadness, hopelessness, or helplessness. MDD can also cause physical symptoms. It can interfere with work, school, relationships, and other everyday activities. MDD may be mild, moderate, or severe. It may occur once (single episode major depressive disorder) or it may occur multiple times (recurrent major depressive disorder). What are the causes? The exact cause of this condition is not known. MDD is most likely caused by a combination of things, which may include:  Genetic factors. These are traits that are passed along from parent to child.  Individual factors. Your personality, your behavior, and the way you handle your thoughts and feelings may contribute to MDD. This includes  personality traits and behaviors learned from others.  Physical factors, such as: ? Differences in the part of your brain that controls emotion. This part of your brain may be different than it is in people who do not have MDD. ? Long-term (chronic) medical or psychiatric illnesses.  Social factors. Traumatic experiences or major life changes may play a role in the development of MDD. What increases the risk? This condition is more likely to develop in women. The following factors may also make you more likely to develop MDD:  A family history of depression.  Troubled family relationships.  Abnormally low levels of certain brain chemicals.  Traumatic events in childhood, especially abuse or the loss of a parent.  Being under a lot of stress, or long-term stress, especially from upsetting life experiences or losses.  A history of: ? Chronic physical illness. ? Other mental health disorders. ? Substance abuse.  Poor living conditions.  Experiencing social exclusion or discrimination on a regular basis. What are the signs or symptoms? The main symptoms of MDD typically include:  Constant depressed or irritable mood.  Loss of interest in things and activities. MDD symptoms may also include:  Sleeping or eating too much or too little.  Unexplained weight change.  Fatigue or low energy.  Feelings of worthlessness or guilt.  Difficulty thinking clearly or making decisions.  Thoughts of suicide or of harming others.  Physical agitation or weakness.  Isolation. Severe cases of MDD may also occur with other symptoms, such as:  Delusions or hallucinations,  in which you imagine things that are not real (psychotic depression).  Low-level depression that lasts at least a year (chronic depression or persistent depressive disorder).  Extreme sadness and hopelessness (melancholic depression).  Trouble speaking and moving (catatonic depression). How is this diagnosed? This  condition may be diagnosed based on:  Your symptoms.  Your medical history, including your mental health history. This may involve tests to evaluate your mental health. You may be asked questions about your lifestyle, including any drug and alcohol use, and how long you have had symptoms of MDD.  A physical exam.  Blood tests to rule out other conditions. You must have a depressed mood and at least four other MDD symptoms most of the day, nearly every day in the same 2-week timeframe before your health care provider can confirm a diagnosis of MDD. How is this treated? This condition is usually treated by mental health professionals, such as psychologists, psychiatrists, and clinical social workers. You may need more than one type of treatment. Treatment may include:  Psychotherapy. This is also called talk therapy or counseling. Types of psychotherapy include: ? Cognitive behavioral therapy (CBT). This type of therapy teaches you to recognize unhealthy feelings, thoughts, and behaviors, and replace them with positive thoughts and actions. ? Interpersonal therapy (IPT). This helps you to improve the way you relate to and communicate with others. ? Family therapy. This treatment includes members of your family.  Medicine to treat anxiety and depression, or to help you control certain emotions and behaviors.  Lifestyle changes, such as: ? Limiting alcohol and drug use. ? Exercising regularly. ? Getting plenty of sleep. ? Making healthy eating choices. ? Spending more time outdoors.  Treatments involving stimulation of the brain can be used in situations with extremely severe symptoms, or when medicine or other therapies do not work over time. These treatments include electroconvulsive therapy, transcranial magnetic stimulation, and vagal nerve stimulation. Follow these instructions at home: Activity  Return to your normal activities as told by your health care provider.  Exercise  regularly and spend time outdoors as told by your health care provider. General instructions  Take over-the-counter and prescription medicines only as told by your health care provider.  Do not drink alcohol. If you drink alcohol, limit your alcohol intake to no more than 1 drink a day for nonpregnant women and 2 drinks a day for men. One drink equals 12 oz of beer, 5 oz of wine, or 1 oz of hard liquor. Alcohol can affect any antidepressant medicines you are taking. Talk to your health care provider about your alcohol use.  Eat a healthy diet and get plenty of sleep.  Find activities that you enjoy doing, and make time to do them.  Consider joining a support group. Your health care provider may be able to recommend a support group.  Keep all follow-up visits as told by your health care provider. This is important. Where to find more information [employer]  www.nami.org [employer]  http://www.maynard.net/www.nimh.nih.gov National Suicide Prevention Lifeline  1-800-273-TALK 272 528 9628(8255). This is free, 24-hour help. Contact a health care provider if:  Your symptoms get worse.  You develop new symptoms. Get help right away if:  You self-harm.  You have serious thoughts about hurting yourself or others.  You see, hear, taste, smell, or feel things that are not present (hallucinate). This information is not intended to replace advice given to you by your health care provider. Make sure  you discuss any questions you have with your health care provider. Document Released: 11/19/2012 Document Revised: 03/31/2016 Document Reviewed: 02/03/2016 Elsevier Interactive Patient Education  2019 [employer]

## 2019-01-18 LAB — MAGNESIUM: Magnesium: 1.6 mg/dL (ref 1.5–2.5)

## 2019-01-18 LAB — CBC WITH DIFFERENTIAL/PLATELET
Absolute Monocytes: 312 cells/uL (ref 200–950)
Basophils Absolute: 29 cells/uL (ref 0–200)
Basophils Relative: 0.7 %
Eosinophils Absolute: 119 cells/uL (ref 15–500)
Eosinophils Relative: 2.9 %
HCT: 40.4 % (ref 38.5–50.0)
Hemoglobin: 13.7 g/dL (ref 13.2–17.1)
Lymphs Abs: 1460 cells/uL (ref 850–3900)
MCH: 32.2 pg (ref 27.0–33.0)
MCHC: 33.9 g/dL (ref 32.0–36.0)
MCV: 95.1 fL (ref 80.0–100.0)
MPV: 10.3 fL (ref 7.5–12.5)
Monocytes Relative: 7.6 %
Neutro Abs: 2181 cells/uL (ref 1500–7800)
Neutrophils Relative %: 53.2 %
Platelets: 117 10*3/uL — ABNORMAL LOW (ref 140–400)
RBC: 4.25 10*6/uL (ref 4.20–5.80)
RDW: 13.2 % (ref 11.0–15.0)
Total Lymphocyte: 35.6 %
WBC: 4.1 10*3/uL (ref 3.8–10.8)

## 2019-01-18 LAB — COMPLETE METABOLIC PANEL WITH GFR
AG Ratio: 1.7 (calc) (ref 1.0–2.5)
ALT: 27 U/L (ref 9–46)
AST: 23 U/L (ref 10–35)
Albumin: 3.8 g/dL (ref 3.6–5.1)
Alkaline phosphatase (APISO): 78 U/L (ref 35–144)
BUN: 19 mg/dL (ref 7–25)
CO2: 32 mmol/L (ref 20–32)
Calcium: 9.9 mg/dL (ref 8.6–10.3)
Chloride: 105 mmol/L (ref 98–110)
Creat: 1.07 mg/dL (ref 0.70–1.18)
GFR, Est African American: 79 mL/min/{1.73_m2} (ref >=60)
GFR, Est Non African American: 68 mL/min/{1.73_m2} (ref >=60)
Globulin: 2.3 g/dL (calc) (ref 1.9–3.7)
Glucose, Bld: 87 mg/dL (ref 65–99)
Potassium: 5 mmol/L (ref 3.5–5.3)
Sodium: 141 mmol/L (ref 135–146)
Total Bilirubin: 0.9 mg/dL (ref 0.2–1.2)
Total Protein: 6.1 g/dL (ref 6.1–8.1)

## 2019-01-18 LAB — LIPID PANEL
Cholesterol: 118 mg/dL (ref <200)
HDL: 36 mg/dL — ABNORMAL LOW (ref >=40)
LDL Cholesterol (Calc): 65 mg/dL (calc)
Non-HDL Cholesterol (Calc): 82 mg/dL (calc) (ref <130)
Total CHOL/HDL Ratio: 3.3 (calc) (ref <5.0)
Triglycerides: 88 mg/dL (ref <150)

## 2019-01-18 LAB — TSH: TSH: 1.24 mIU/L (ref 0.40–4.50)

## 2019-01-24 ENCOUNTER — Other Ambulatory Visit (INDEPENDENT_AMBULATORY_CARE_PROVIDER_SITE_OTHER): Payer: Self-pay | Admitting: Specialist

## 2019-01-28 NOTE — Telephone Encounter (Signed)
omperazole

## 2019-01-29 DIAGNOSIS — R351 Nocturia: Secondary | ICD-10-CM | POA: Diagnosis not present

## 2019-01-29 DIAGNOSIS — N311 Reflex neuropathic bladder, not elsewhere classified: Secondary | ICD-10-CM | POA: Diagnosis not present

## 2019-01-29 DIAGNOSIS — R3915 Urgency of urination: Secondary | ICD-10-CM | POA: Diagnosis not present

## 2019-02-04 ENCOUNTER — Other Ambulatory Visit: Payer: Self-pay | Admitting: Internal Medicine

## 2019-02-04 DIAGNOSIS — G250 Essential tremor: Secondary | ICD-10-CM

## 2019-02-05 IMAGING — MR MR HEAD W/O CM
10 series · 48 of 48 positions shown · non-contrast
Comparison: None available for comparison at time of study
interpretation.

CLINICAL DATA: Ataxia, gait instability. Head injury resulting in
loss of consciousness January 2016. History of hypertension, vascular
parkinsonism, hyperlipidemia, tremor.

EXAM:
MRI HEAD WITHOUT CONTRAST
TECHNIQUE: Multiplanar, multiecho pulse sequences of the brain and surrounding
structures were obtained without intravenous contrast.

[Series 2: t1_se_sag · sagittal · 5.0mm · 0.45mm/px · 2 of 21 slices shown]
[im 1/21]
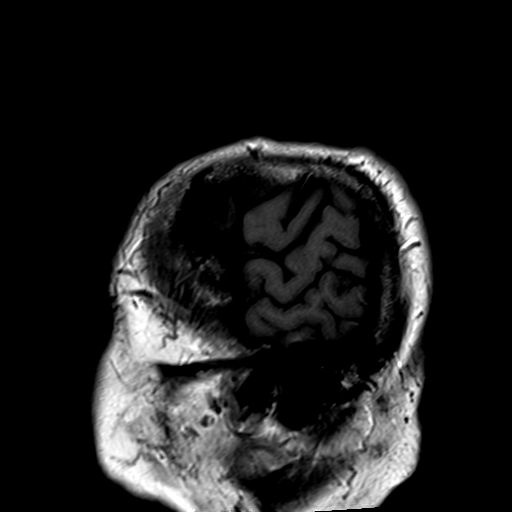
[im 21/21]
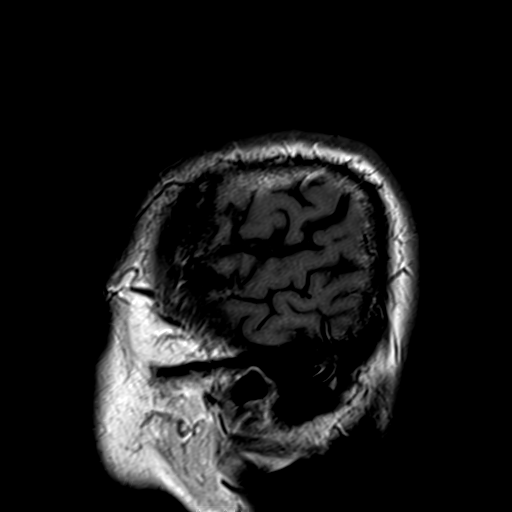

[Series 3: ep2d_diff_(id)_trace · axial · 3.0mm · 1.80mm/px · z∈[-71,+81]mm · 9 of 104 slices shown]
[im 1/104]
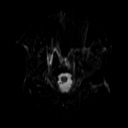
[im 13/104]
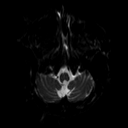
[im 26/104]
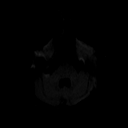
[im 39/104]
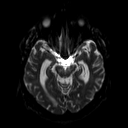
[im 52/104]
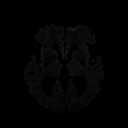
[im 65/104]
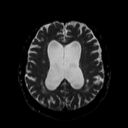
[im 78/104]
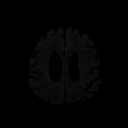
[im 91/104]
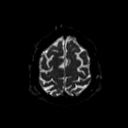
[im 104/104]
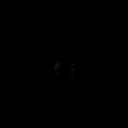

[Series 4: ep2d_diff_(id)_trace_adc · axial · 3.0mm · 1.80mm/px · z∈[-71,+81]mm · 4 of 52 slices shown]
[im 1/52]
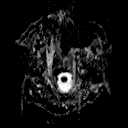
[im 18/52]
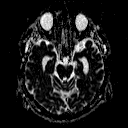
[im 35/52]
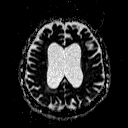
[im 52/52]
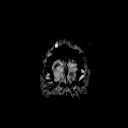

[Series 5: ep2d_diff_cor · coronal · 5.0mm · 1.77mm/px · 4 of 50 slices shown]
[im 1/50]
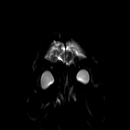
[im 17/50]
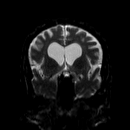
[im 33/50]
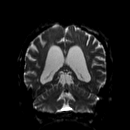
[im 50/50]
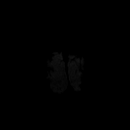

[Series 6: ep2d_diff_cor_adc · coronal · 5.0mm · 1.77mm/px · 2 of 25 slices shown]
[im 1/25]
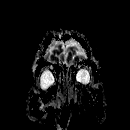
[im 25/25]
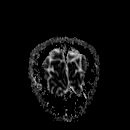

[Series 7: t2_tse_tra_512 · axial · 5.0mm · 0.60mm/px · z∈[-70,+80]mm · 2 of 26 slices shown]
[im 1/26]
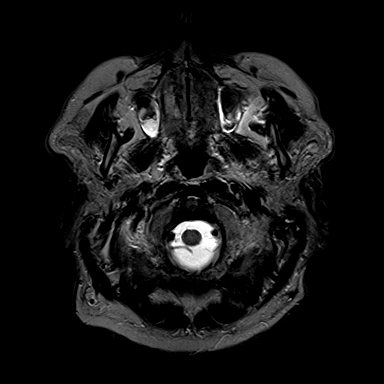
[im 26/26]
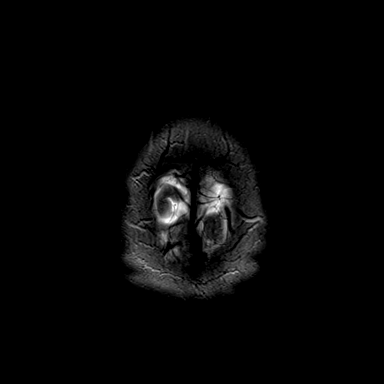

[Series 9: swi_images · axial · 2.0mm · 0.90mm/px · z∈[-73,+84]mm · 7 of 80 slices shown]
[im 1/80]
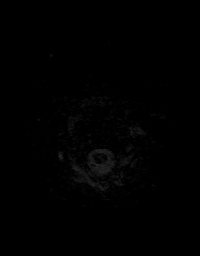
[im 14/80]
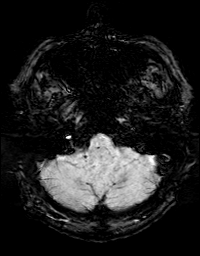
[im 27/80]
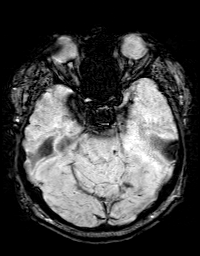
[im 40/80]
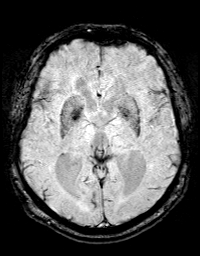
[im 53/80]
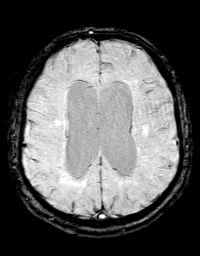
[im 66/80]
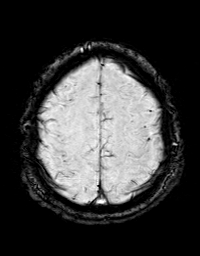
[im 80/80]
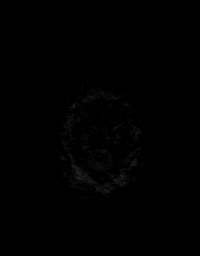

[Series 10: FLAIR · axial · 3.1mm · 0.43mm/px · z∈[-73,+82]mm · 4 of 50 slices shown]
[im 1/50]
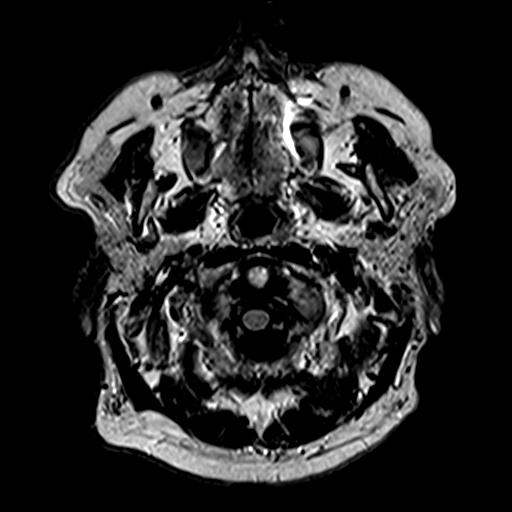
[im 17/50]
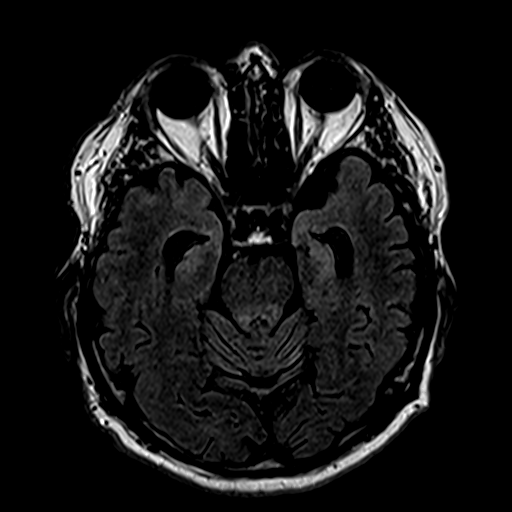
[im 33/50]
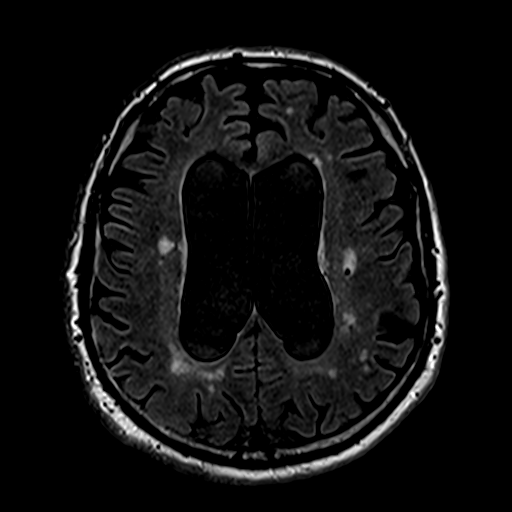
[im 50/50]
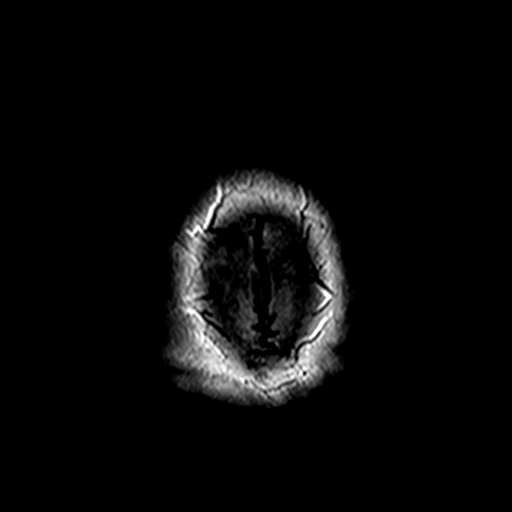

[Series 11: t1_mpr_tra · axial · 1.1mm · 0.72mm/px · z∈[-73,+84]mm · 12 of 144 slices shown]
[im 1/144]
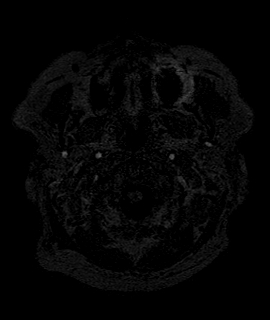
[im 14/144]
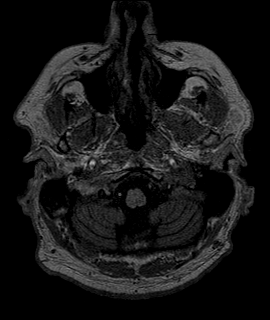
[im 27/144]
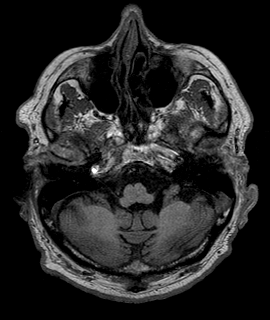
[im 40/144]
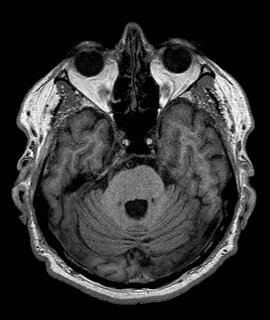
[im 53/144]
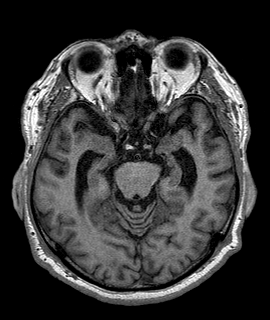
[im 66/144]
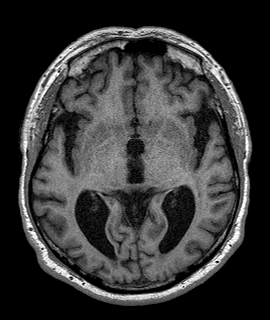
[im 79/144]
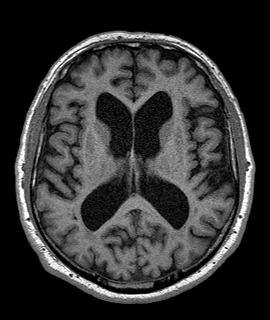
[im 92/144]
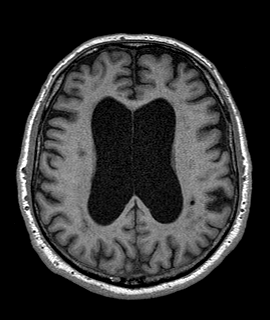
[im 105/144]
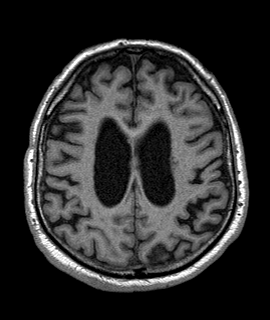
[im 118/144]
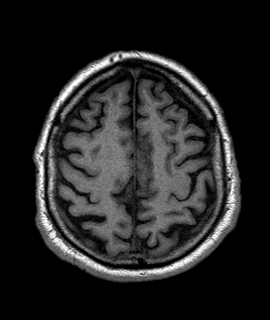
[im 131/144]
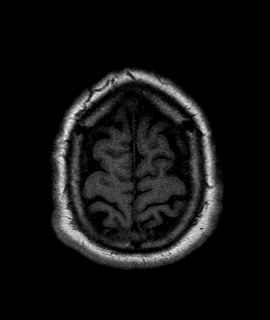
[im 144/144]
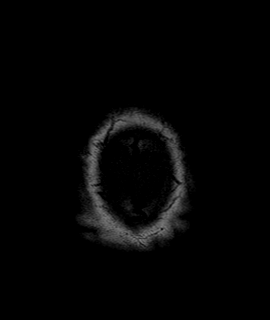

[Series 12: T2 · coronal · 5.0mm · 0.45mm/px · 2 of 25 slices shown]
[im 1/25]
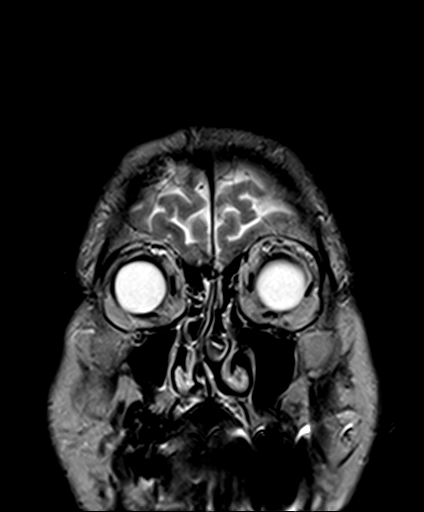
[im 25/25]
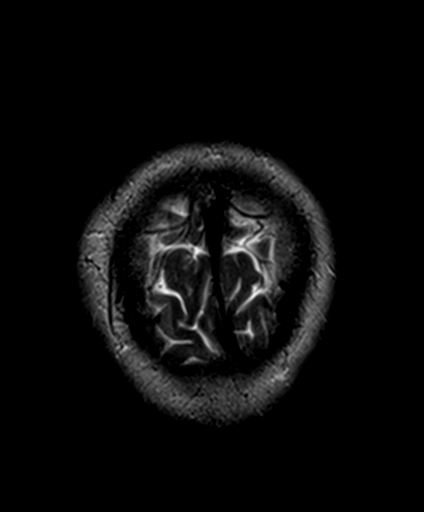

[48 of 48 positions shown; findings below may reference images not displayed]

FINDINGS: BRAIN: Severe ventriculomegaly on the basis of global parenchymal
brain volume loss as there is overall commensurate enlargement of
cerebral sulci and cerebellar folia. Patchy supratentorial white
matter FLAIR T2 hyperintensities with central cystic changes. No
midline shift, mass effect or masses. No abnormal extra-axial fluid
collections.

VASCULAR: Normal major intracranial vascular flow voids present at
skull base.

SKULL AND UPPER CERVICAL SPINE: No abnormal sellar expansion. No
suspicious calvarial bone marrow signal. Craniocervical junction
maintained.

SINUSES/ORBITS: Trace paranasal sinus and mastoid effusions. The
included ocular globes and orbital contents are non-suspicious.

OTHER: None.
IMPRESSION: Severe global parenchymal brain volume loss.

Moderate chronic small vessel ischemic disease.

## 2019-02-15 DIAGNOSIS — N3941 Urge incontinence: Secondary | ICD-10-CM | POA: Diagnosis not present

## 2019-02-15 DIAGNOSIS — N311 Reflex neuropathic bladder, not elsewhere classified: Secondary | ICD-10-CM | POA: Diagnosis not present

## 2019-02-22 DIAGNOSIS — R3915 Urgency of urination: Secondary | ICD-10-CM | POA: Diagnosis not present

## 2019-03-01 DIAGNOSIS — R3915 Urgency of urination: Secondary | ICD-10-CM | POA: Diagnosis not present

## 2019-03-06 DIAGNOSIS — H527 Unspecified disorder of refraction: Secondary | ICD-10-CM | POA: Diagnosis not present

## 2019-03-06 DIAGNOSIS — H25813 Combined forms of age-related cataract, bilateral: Secondary | ICD-10-CM | POA: Diagnosis not present

## 2019-03-06 DIAGNOSIS — H43393 Other vitreous opacities, bilateral: Secondary | ICD-10-CM | POA: Diagnosis not present

## 2019-03-08 DIAGNOSIS — R3915 Urgency of urination: Secondary | ICD-10-CM | POA: Diagnosis not present

## 2019-03-14 DIAGNOSIS — L639 Alopecia areata, unspecified: Secondary | ICD-10-CM | POA: Diagnosis not present

## 2019-03-15 DIAGNOSIS — R3915 Urgency of urination: Secondary | ICD-10-CM | POA: Diagnosis not present

## 2019-03-22 DIAGNOSIS — R3915 Urgency of urination: Secondary | ICD-10-CM | POA: Diagnosis not present

## 2019-03-22 DIAGNOSIS — R351 Nocturia: Secondary | ICD-10-CM | POA: Diagnosis not present

## 2019-03-29 DIAGNOSIS — R3915 Urgency of urination: Secondary | ICD-10-CM | POA: Diagnosis not present

## 2019-04-03 ENCOUNTER — Other Ambulatory Visit (INDEPENDENT_AMBULATORY_CARE_PROVIDER_SITE_OTHER): Payer: Self-pay | Admitting: Specialist

## 2019-04-05 DIAGNOSIS — R3915 Urgency of urination: Secondary | ICD-10-CM | POA: Diagnosis not present

## 2019-04-12 DIAGNOSIS — R3915 Urgency of urination: Secondary | ICD-10-CM | POA: Diagnosis not present

## 2019-04-23 ENCOUNTER — Ambulatory Visit: Payer: Medicare Other | Admitting: Internal Medicine

## 2019-04-24 DIAGNOSIS — L639 Alopecia areata, unspecified: Secondary | ICD-10-CM | POA: Diagnosis not present

## 2019-04-24 DIAGNOSIS — Z23 Encounter for immunization: Secondary | ICD-10-CM | POA: Diagnosis not present

## 2019-04-26 DIAGNOSIS — R3915 Urgency of urination: Secondary | ICD-10-CM | POA: Diagnosis not present

## 2019-05-03 DIAGNOSIS — R3915 Urgency of urination: Secondary | ICD-10-CM | POA: Diagnosis not present

## 2019-05-06 DIAGNOSIS — Z23 Encounter for immunization: Secondary | ICD-10-CM | POA: Diagnosis not present

## 2019-05-06 DIAGNOSIS — I2510 Atherosclerotic heart disease of native coronary artery without angina pectoris: Secondary | ICD-10-CM | POA: Diagnosis not present

## 2019-05-06 DIAGNOSIS — I129 Hypertensive chronic kidney disease with stage 1 through stage 4 chronic kidney disease, or unspecified chronic kidney disease: Secondary | ICD-10-CM | POA: Diagnosis not present

## 2019-05-06 DIAGNOSIS — R351 Nocturia: Secondary | ICD-10-CM | POA: Diagnosis not present

## 2019-05-06 DIAGNOSIS — E785 Hyperlipidemia, unspecified: Secondary | ICD-10-CM | POA: Diagnosis not present

## 2019-05-06 DIAGNOSIS — R809 Proteinuria, unspecified: Secondary | ICD-10-CM | POA: Diagnosis not present

## 2019-05-06 DIAGNOSIS — E669 Obesity, unspecified: Secondary | ICD-10-CM | POA: Diagnosis not present

## 2019-05-10 DIAGNOSIS — R351 Nocturia: Secondary | ICD-10-CM | POA: Diagnosis not present

## 2019-05-10 DIAGNOSIS — R3915 Urgency of urination: Secondary | ICD-10-CM | POA: Diagnosis not present

## 2019-05-20 DIAGNOSIS — Z23 Encounter for immunization: Secondary | ICD-10-CM | POA: Diagnosis not present

## 2019-05-20 DIAGNOSIS — I2510 Atherosclerotic heart disease of native coronary artery without angina pectoris: Secondary | ICD-10-CM | POA: Diagnosis not present

## 2019-05-20 DIAGNOSIS — E785 Hyperlipidemia, unspecified: Secondary | ICD-10-CM | POA: Diagnosis not present

## 2019-05-20 DIAGNOSIS — Z8719 Personal history of other diseases of the digestive system: Secondary | ICD-10-CM | POA: Diagnosis not present

## 2019-05-20 DIAGNOSIS — I10 Essential (primary) hypertension: Secondary | ICD-10-CM | POA: Diagnosis not present

## 2019-05-20 DIAGNOSIS — Z955 Presence of coronary angioplasty implant and graft: Secondary | ICD-10-CM | POA: Diagnosis not present

## 2019-05-20 DIAGNOSIS — R001 Bradycardia, unspecified: Secondary | ICD-10-CM | POA: Diagnosis not present

## 2019-05-28 DIAGNOSIS — N189 Chronic kidney disease, unspecified: Secondary | ICD-10-CM | POA: Diagnosis not present

## 2019-05-28 DIAGNOSIS — I2510 Atherosclerotic heart disease of native coronary artery without angina pectoris: Secondary | ICD-10-CM | POA: Diagnosis not present

## 2019-05-31 DIAGNOSIS — R3915 Urgency of urination: Secondary | ICD-10-CM | POA: Diagnosis not present

## 2019-06-03 ENCOUNTER — Ambulatory Visit: Payer: Medicare Other | Admitting: Internal Medicine

## 2019-06-06 ENCOUNTER — Encounter: Payer: Self-pay | Admitting: Internal Medicine

## 2019-06-17 DIAGNOSIS — L639 Alopecia areata, unspecified: Secondary | ICD-10-CM | POA: Diagnosis not present

## 2019-06-21 DIAGNOSIS — R3915 Urgency of urination: Secondary | ICD-10-CM | POA: Diagnosis not present

## 2019-07-08 DIAGNOSIS — E785 Hyperlipidemia, unspecified: Secondary | ICD-10-CM | POA: Diagnosis not present

## 2019-07-08 DIAGNOSIS — I480 Paroxysmal atrial fibrillation: Secondary | ICD-10-CM | POA: Diagnosis not present

## 2019-07-08 DIAGNOSIS — I2510 Atherosclerotic heart disease of native coronary artery without angina pectoris: Secondary | ICD-10-CM | POA: Diagnosis not present

## 2019-07-08 DIAGNOSIS — Z955 Presence of coronary angioplasty implant and graft: Secondary | ICD-10-CM | POA: Diagnosis not present

## 2019-07-08 DIAGNOSIS — I10 Essential (primary) hypertension: Secondary | ICD-10-CM | POA: Diagnosis not present

## 2019-07-08 DIAGNOSIS — Z8719 Personal history of other diseases of the digestive system: Secondary | ICD-10-CM | POA: Diagnosis not present

## 2019-07-08 DIAGNOSIS — R001 Bradycardia, unspecified: Secondary | ICD-10-CM | POA: Diagnosis not present

## 2019-07-09 ENCOUNTER — Other Ambulatory Visit (INDEPENDENT_AMBULATORY_CARE_PROVIDER_SITE_OTHER): Payer: Self-pay | Admitting: Specialist

## 2019-07-19 DIAGNOSIS — R3915 Urgency of urination: Secondary | ICD-10-CM | POA: Diagnosis not present

## 2019-07-22 ENCOUNTER — Encounter: Payer: Self-pay | Admitting: Internal Medicine

## 2019-07-22 NOTE — Progress Notes (Signed)
Comprehensive Evaluation & Examination     This very nice 73 y.o. MWM presents for a  comprehensive evaluation and management of multiple medical co-morbidities.  Patient has been followed for HTN, ASHD, CKD, HLD, Prediabetes and Vitamin D Deficiency. Patient has been evaluated by Dr Tat for unstable gait & had spinal tap to r/o NPH. Patient Has hx/o Gout quiescent on his meds and also has GERD controlled on diet & meds. He has BPH with LUTS improved on meds.      HTN predates circa 1988. Patient's BP has been controlled at home.  Today's BP is at goal - 124/72. Cardiopulmonary testing & Cardiac MRI in 2017 per Dr Caryl Comes was negative / Normal. In 11/2017 , he was hospitalized at [facility] with an ACS and had PCA/DES to  the LAD. 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   Patient was dx'd with Afib by Dr Elonda Husky / J[facility] Ser.  He has CKD2 w/ proteinuria followed by Dr Moshe Cipro.  Patient denies any cardiac symptoms as chest pain, palpitations, shortness of breath, dizziness or ankle swelling.     Patient's hyperlipidemia is controlled with diet and medications. Patient denies myalgias or other medication SE's. Last lipids were at goal:  Lab Results  Component Value Date   CHOL 118 01/17/2019   HDL 36 (L) 01/17/2019   LDLCALC 65 01/17/2019   TRIG 88 01/17/2019   CHOLHDL 3.3 01/17/2019      Patient has moderate Obesity (BMI 30+) and is monitored expectantly for glucose intolerance  and patient denies reactive hypoglycemic symptoms, visual blurring, diabetic polys or paresthesias. Last A1c was Normal & at goal:  Lab Results  Component Value Date   HGBA1C 5.3 07/10/2018       Finally, patient has history of Vitamin D Deficiency ("11" / 2009)  and last vitamin D was at goal:  Lab Results  Component Value Date   VD25OH 18 07/10/2018   Current Outpatient Medications on File Prior to Visit  Medication Sig  . aspirin EC 81 MG tablet Take 81 mg by mouth daily.  Marland Kitchen atorvastatin (LIPITOR)  20 MG tablet Take 20 mg by mouth daily.  . Cholecalciferol (VITAMIN D) 2000 units tablet Take 8,000 Units by mouth every morning.   . diazepam (VALIUM) 5 MG tablet Take 1 tablet 3 x /day for Hereditary Tremor  . diclofenac sodium (VOLTAREN) 1 % GEL Apply 4 g topically as needed.  . isosorbide mononitrate (IMDUR) 30 MG 24 hr tablet TAKE 1 TABLET(30 MG) BY MOUTH DAILY  . MAGNESIUM PO Take 500 mg by mouth 2 (two) times daily.   . metoprolol succinate (TOPROL-XL) 25 MG 24 hr tablet Take 1 tablet by mouth daily.  . Minoxidil (ROGAINE MENS) 5 % FOAM Apply 1 application topically daily.  Marland Kitchen MYRBETRIQ 50 MG TB24 tablet TAKE 1 TABLET(50 MG) BY MOUTH DAILY  . nitroGLYCERIN (NITROSTAT) 0.4 MG SL tablet Place 1 tablet under the tongue every 5 (five) minutes as needed. For chest pain  . Omega-3 Fatty Acids (FISH OIL) 1200 MG CPDR Take 2 capsules by mouth at bedtime.   Marland Kitchen omeprazole (PRILOSEC) 20 MG capsule TAKE 1 CAPSULE BY MOUTH DAILY  . traMADol (ULTRAM) 50 MG tablet TAKE 1 TABLET BY MOUTH EVERY 6 HOURS AS NEEDED  . triamcinolone ointment (KENALOG) 0.1 % Apply 1 application topically 3 (three) times daily. (Patient taking differently: Apply 1 application topically 2 (two) times daily as needed. )  . vitamin B-12 (CYANOCOBALAMIN) 1000 MCG  tablet Take 1,000 mcg by mouth daily.   No current facility-administered medications on file prior to visit.   Allergies  Allergen Reactions  . Enalapril Rash  . Aspirin     Internal bleeding  . Nsaids     Internal bleeding  . Welchol [Colesevelam Hcl] Diarrhea  . Vasotec [Enalaprilat] Rash   Past Medical History:  Diagnosis Date  . CAD (coronary artery disease)   . Essential tremor   . Gastric ulcer 01/14/2015  . Gout   . Hyperlipidemia   . Hypertension   . Parkinson's disease (HCC)   . Prediabetes   . Proteinuria   . Vitamin D deficiency    Health Maintenance  Topic Date Due  . Hepatitis C Screening  01/17/2020 (Originally 10/09/1945)  . COLONOSCOPY   02/02/2022  . TETANUS/TDAP  01/29/2024  . INFLUENZA VACCINE  Completed  . PNA vac Low Risk Adult  Completed   Immunization History  Administered Date(s) Administered  . Influenza Split 05/23/2013  . Influenza, High Dose Seasonal PF 05/14/2014, 06/11/2015, 05/19/2016, 06/10/2017, 05/20/2019  . Pneumococcal Conjugate-13 12/03/2014  . Pneumococcal Polysaccharide-23 06/01/2010  . Td 06/01/2010  . Tdap 01/28/2014  . Zoster 08/08/2004   Last Colon - 02/03/2012 - Dr Neomia GlassJL Edwards  Past Surgical History:  Procedure Laterality Date  . CHOLECYSTECTOMY    . CYST EXCISION     back  . KNEE SURGERY Right   . TONSILLECTOMY     Family History  Problem Relation Age of Onset  . Dementia Mother   . Stroke Father   . Emphysema Brother   . Heart disease Brother    Social History   Socioeconomic History  . Marital status: Married    Spouse name: Not on file  . Number of children: Not on file  Occupational History  . 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 Smoking status: Never Smoker  . Smokeless tobacco: Never Used  Substance and Sexual Activity  . Alcohol use: Not Currently    Alcohol/week: 14.0 standard drinks    Types: 14 Standard drinks or equivalent per week  . Drug use: No    ROS Constitutional: Denies fever, chills, weight loss/gain, headaches, insomnia,  night sweats or change in appetite. Does c/o fatigue. Eyes: Denies redness, blurred vision, diplopia, discharge, itchy or watery eyes.  ENT: Denies discharge, congestion, post nasal drip, epistaxis, sore throat, earache, hearing loss, dental pain, Tinnitus, Vertigo, Sinus pain or snoring.  Cardio: Denies chest pain, palpitations, irregular heartbeat, syncope, dyspnea, diaphoresis, orthopnea, PND, claudication or edema Respiratory: denies cough, dyspnea, DOE, pleurisy, hoarseness, laryngitis or wheezing.  Gastrointestinal: Denies dysphagia, heartburn, reflux, water  brash, pain, cramps, nausea, vomiting, bloating, diarrhea, constipation, hematemesis, melena, hematochezia, jaundice or hemorrhoids Genitourinary: Denies dysuria, frequency, discharge, hematuria or flank pain. Has urgency, nocturia x 2-3 & occasional hesitancy. Musculoskeletal: Denies arthralgia, myalgia, stiffness, Jt. Swelling, pain, limp or strain/sprain. Denies Falls. Skin: Denies puritis, rash, hives, warts, acne, eczema or change in skin lesion Neuro: No weakness, tremor, incoordination, spasms, paresthesia or pain Psychiatric: Denies confusion, memory loss or sensory loss. Denies Depression. Endocrine: Denies change in weight, skin, hair change, nocturia, and paresthesia, diabetic polys, visual blurring or hyper / hypo glycemic episodes.  Heme/Lymph: No excessive bleeding, bruising or enlarged lymph nodes.  Physical Exam  BP 124/72   Pulse (!) 56   Temp (!) 97.2 F (36.2 C)   Resp 16   Ht 6' (1.829 m)  Wt 201 lb 12.8 oz (91.5 kg)   BMI 27.37 kg/m   General Appearance: Well nourished and well groomed and in no apparent distress.  Eyes: PERRLA, EOMs, conjunctiva no swelling or erythema, normal fundi and vessels. Sinuses: No frontal/maxillary tenderness ENT/Mouth: EACs patent / TMs  nl. Nares clear without erythema, swelling, mucoid exudates. Oral hygiene is good. No erythema, swelling, or exudate. Tongue normal, non-obstructing. Tonsils not swollen or erythematous. Hearing normal.  Neck: Supple, thyroid not palpable. No bruits, nodes or JVD. Respiratory: Respiratory effort normal.  BS equal and clear bilateral without rales, rhonci, wheezing or stridor. Cardio: Heart sounds are normal with regular rate and rhythm and no murmurs, rubs or gallops. Peripheral pulses are normal and equal bilaterally without edema. No aortic or femoral bruits. Chest: symmetric with normal excursions and percussion.  Abdomen: Soft, with Nl bowel sounds. Nontender, no guarding, rebound, hernias, masses,  or organomegaly.  Lymphatics: Non tender without lymphadenopathy.  Musculoskeletal: Full ROM all peripheral extremities, joint stability, 5/5 strength, and normal gait. Skin: Warm and dry without rashes, lesions, cyanosis, clubbing or  ecchymosis.  Neuro: Cranial nerves intact, reflexes equal bilaterally. Normal muscle tone, no cerebellar symptoms. Sensation intact.  Pysch: Alert and oriented X 3 with normal affect, insight and judgment appropriate.   Assessment and Plan  1. Essential hypertension  - EKG 12-Lead - [place], retroperitnl abd,  ltd - Urinalysis, Routine w reflex microscopic - Microalbumin / Creatinine Urine Ratio - CBC with Diff - COMPLETE METABOLIC PANEL WITH GFR - Magnesium - TSH  2. Hyperlipidemia, mixed  - EKG 12-Lead - [place], retroperitnl abd,  ltd - Lipid Profile - TSH  3. Abnormal glucose  - EKG 12-Lead - [place], retroperitnl abd,  ltd - Hemoglobin A1c ([facility]) - Insulin, random  4. Vitamin D deficiency  - Vitamin D (25 hydroxy)  5. Coronary artery disease involving native coronary artery of native heart, angina presence unspecified  - EKG 12-Lead - Lipid Profile  6. Idiopathic gout, unspecified chronicity, unspecified site  - Uric acid  7. BPH with obstruction/lower urinary tract symptoms  - PSA  8. Prostate cancer screening  - PSA  9. Screening for colorectal cancer  - POC Hemoccult Bld/Stl  10. Screening for ischemic heart disease  - EKG 12-Lead  11. FHx: heart disease  - EKG 12-Lead - [place], retroperitnl abd,  ltd  12. Screening for AAA (aortic abdominal aneurysm)  - [place], retroperitnl abd,  ltd  13. Medication management  - Urinalysis, Routine w reflex microscopic - Microalbumin / Creatinine Urine Ratio - CBC with Diff - COMPLETE METABOLIC PANEL WITH GFR - Magnesium - Lipid Profile - TSH - Hemoglobin A1c ([facility]) - Insulin, random - Vitamin D (25 hydroxy) - Uric acid        Patient was counseled in prudent diet, weight  control to achieve/maintain BMI less than 25, BP monitoring, regular exercise and medications as discussed.  Discussed med effects and SE's. Routine screening labs and tests as requested with regular follow-up as recommended. Over 40 minutes of exam, counseling, chart review and high complex critical decision making was performed   Marinus Maw, MD

## 2019-07-22 NOTE — Patient Instructions (Signed)

## 2019-07-23 ENCOUNTER — Other Ambulatory Visit: Payer: Self-pay

## 2019-07-23 ENCOUNTER — Ambulatory Visit (INDEPENDENT_AMBULATORY_CARE_PROVIDER_SITE_OTHER): Payer: Medicare Other | Admitting: Internal Medicine

## 2019-07-23 ENCOUNTER — Encounter: Payer: Self-pay | Admitting: Internal Medicine

## 2019-07-23 VITALS — BP 124/72 | HR 56 | Temp 97.2°F | Resp 16 | Ht 72.0 in | Wt 201.8 lb

## 2019-07-23 DIAGNOSIS — R7309 Other abnormal glucose: Secondary | ICD-10-CM | POA: Diagnosis not present

## 2019-07-23 DIAGNOSIS — E559 Vitamin D deficiency, unspecified: Secondary | ICD-10-CM | POA: Diagnosis not present

## 2019-07-23 DIAGNOSIS — E782 Mixed hyperlipidemia: Secondary | ICD-10-CM

## 2019-07-23 DIAGNOSIS — Z136 Encounter for screening for cardiovascular disorders: Secondary | ICD-10-CM

## 2019-07-23 DIAGNOSIS — Z125 Encounter for screening for malignant neoplasm of prostate: Secondary | ICD-10-CM

## 2019-07-23 DIAGNOSIS — I2510 Atherosclerotic heart disease of native coronary artery without angina pectoris: Secondary | ICD-10-CM

## 2019-07-23 DIAGNOSIS — I10 Essential (primary) hypertension: Secondary | ICD-10-CM | POA: Diagnosis not present

## 2019-07-23 DIAGNOSIS — M1000 Idiopathic gout, unspecified site: Secondary | ICD-10-CM

## 2019-07-23 DIAGNOSIS — Z79899 Other long term (current) drug therapy: Secondary | ICD-10-CM

## 2019-07-23 DIAGNOSIS — Z8249 Family history of ischemic heart disease and other diseases of the circulatory system: Secondary | ICD-10-CM

## 2019-07-23 DIAGNOSIS — N401 Enlarged prostate with lower urinary tract symptoms: Secondary | ICD-10-CM

## 2019-07-23 DIAGNOSIS — Z1211 Encounter for screening for malignant neoplasm of colon: Secondary | ICD-10-CM

## 2019-07-23 DIAGNOSIS — N138 Other obstructive and reflux uropathy: Secondary | ICD-10-CM

## 2019-07-24 LAB — URINALYSIS, ROUTINE W REFLEX MICROSCOPIC
Bacteria, UA: NONE SEEN /HPF
Bilirubin Urine: NEGATIVE
Glucose, UA: NEGATIVE
Hgb urine dipstick: NEGATIVE
Hyaline Cast: NONE SEEN /LPF
Ketones, ur: NEGATIVE
Leukocytes,Ua: NEGATIVE
Nitrite: NEGATIVE
RBC / HPF: NONE SEEN /HPF (ref 0–2)
Specific Gravity, Urine: 1.017 (ref 1.001–1.03)
Squamous Epithelial / HPF: NONE SEEN /HPF (ref <=5)
WBC, UA: NONE SEEN /HPF (ref 0–5)
pH: 7.5 (ref 5.0–8.0)

## 2019-07-24 LAB — CBC WITH DIFFERENTIAL/PLATELET
Absolute Monocytes: 293 cells/uL (ref 200–950)
Basophils Absolute: 39 cells/uL (ref 0–200)
Basophils Relative: 1 %
Eosinophils Absolute: 121 cells/uL (ref 15–500)
Eosinophils Relative: 3.1 %
HCT: 43.5 % (ref 38.5–50.0)
Hemoglobin: 14.5 g/dL (ref 13.2–17.1)
Lymphs Abs: 1619 cells/uL (ref 850–3900)
MCH: 31.9 pg (ref 27.0–33.0)
MCHC: 33.3 g/dL (ref 32.0–36.0)
MCV: 95.6 fL (ref 80.0–100.0)
MPV: 10.2 fL (ref 7.5–12.5)
Monocytes Relative: 7.5 %
Neutro Abs: 1829 cells/uL (ref 1500–7800)
Neutrophils Relative %: 46.9 %
Platelets: 137 10*3/uL — ABNORMAL LOW (ref 140–400)
RBC: 4.55 10*6/uL (ref 4.20–5.80)
RDW: 12.9 % (ref 11.0–15.0)
Total Lymphocyte: 41.5 %
WBC: 3.9 10*3/uL (ref 3.8–10.8)

## 2019-07-24 LAB — COMPLETE METABOLIC PANEL WITH GFR
AG Ratio: 1.8 (calc) (ref 1.0–2.5)
ALT: 18 U/L (ref 9–46)
AST: 22 U/L (ref 10–35)
Albumin: 3.9 g/dL (ref 3.6–5.1)
Alkaline phosphatase (APISO): 64 U/L (ref 35–144)
BUN/Creatinine Ratio: 17 (calc) (ref 6–22)
BUN: 23 mg/dL (ref 7–25)
CO2: 32 mmol/L (ref 20–32)
Calcium: 10 mg/dL (ref 8.6–10.3)
Chloride: 103 mmol/L (ref 98–110)
Creat: 1.36 mg/dL — ABNORMAL HIGH (ref 0.70–1.18)
GFR, Est African American: 59 mL/min/{1.73_m2} — ABNORMAL LOW (ref >=60)
GFR, Est Non African American: 51 mL/min/{1.73_m2} — ABNORMAL LOW (ref >=60)
Globulin: 2.2 g/dL (calc) (ref 1.9–3.7)
Glucose, Bld: 77 mg/dL (ref 65–99)
Potassium: 5 mmol/L (ref 3.5–5.3)
Sodium: 141 mmol/L (ref 135–146)
Total Bilirubin: 1.1 mg/dL (ref 0.2–1.2)
Total Protein: 6.1 g/dL (ref 6.1–8.1)

## 2019-07-24 LAB — HEMOGLOBIN A1C
Hgb A1c MFr Bld: 5.5 % of total Hgb (ref <5.7)
Mean Plasma Glucose: 111 (calc)
eAG (mmol/L): 6.2 (calc)

## 2019-07-24 LAB — LIPID PANEL
Cholesterol: 121 mg/dL (ref <200)
HDL: 40 mg/dL (ref >=40)
LDL Cholesterol (Calc): 66 mg/dL (calc)
Non-HDL Cholesterol (Calc): 81 mg/dL (calc) (ref <130)
Total CHOL/HDL Ratio: 3 (calc) (ref <5.0)
Triglycerides: 69 mg/dL (ref <150)

## 2019-07-24 LAB — VITAMIN D 25 HYDROXY (VIT D DEFICIENCY, FRACTURES): Vit D, 25-Hydroxy: 61 ng/mL (ref 30–100)

## 2019-07-24 LAB — MICROALBUMIN / CREATININE URINE RATIO
Creatinine, Urine: 98 mg/dL (ref 20–320)
Microalb Creat Ratio: 1402 mcg/mg creat — ABNORMAL HIGH (ref <30)
Microalb, Ur: 137.4 mg/dL

## 2019-07-24 LAB — INSULIN, RANDOM: Insulin: 4.1 u[IU]/mL

## 2019-07-24 LAB — TSH: TSH: 1.5 mIU/L (ref 0.40–4.50)

## 2019-07-24 LAB — PSA: PSA: 1 ng/mL (ref <=4.0)

## 2019-07-24 LAB — MAGNESIUM: Magnesium: 1.9 mg/dL (ref 1.5–2.5)

## 2019-07-24 LAB — URIC ACID: Uric Acid, Serum: 6.1 mg/dL (ref 4.0–8.0)

## 2019-08-08 ENCOUNTER — Encounter: Payer: Self-pay | Admitting: Internal Medicine

## 2019-08-12 DIAGNOSIS — I10 Essential (primary) hypertension: Secondary | ICD-10-CM | POA: Diagnosis not present

## 2019-08-12 DIAGNOSIS — I480 Paroxysmal atrial fibrillation: Secondary | ICD-10-CM | POA: Diagnosis not present

## 2019-08-12 DIAGNOSIS — E785 Hyperlipidemia, unspecified: Secondary | ICD-10-CM | POA: Diagnosis not present

## 2019-08-12 DIAGNOSIS — I2510 Atherosclerotic heart disease of native coronary artery without angina pectoris: Secondary | ICD-10-CM | POA: Diagnosis not present

## 2019-08-12 DIAGNOSIS — Z955 Presence of coronary angioplasty implant and graft: Secondary | ICD-10-CM | POA: Diagnosis not present

## 2019-08-12 DIAGNOSIS — Z8719 Personal history of other diseases of the digestive system: Secondary | ICD-10-CM | POA: Diagnosis not present

## 2019-08-12 DIAGNOSIS — R001 Bradycardia, unspecified: Secondary | ICD-10-CM | POA: Diagnosis not present

## 2019-08-19 ENCOUNTER — Other Ambulatory Visit: Payer: Self-pay

## 2019-08-19 NOTE — Telephone Encounter (Signed)
Refill

## 2019-08-20 ENCOUNTER — Other Ambulatory Visit: Payer: Self-pay | Admitting: Internal Medicine

## 2019-08-20 DIAGNOSIS — I2510 Atherosclerotic heart disease of native coronary artery without angina pectoris: Secondary | ICD-10-CM

## 2019-08-20 DIAGNOSIS — E7800 Pure hypercholesterolemia, unspecified: Secondary | ICD-10-CM

## 2019-08-20 MED ORDER — ATORVASTATIN CALCIUM 80 MG PO TABS: ORAL_TABLET | ORAL | 3 refills | Status: DC

## 2019-08-23 DIAGNOSIS — R3915 Urgency of urination: Secondary | ICD-10-CM | POA: Diagnosis not present

## 2019-09-13 DIAGNOSIS — Z20822 Contact with and (suspected) exposure to covid-19: Secondary | ICD-10-CM | POA: Diagnosis not present

## 2019-09-17 DIAGNOSIS — R3915 Urgency of urination: Secondary | ICD-10-CM | POA: Diagnosis not present

## 2019-09-18 DIAGNOSIS — Z79899 Other long term (current) drug therapy: Secondary | ICD-10-CM | POA: Diagnosis not present

## 2019-09-18 DIAGNOSIS — I2510 Atherosclerotic heart disease of native coronary artery without angina pectoris: Secondary | ICD-10-CM | POA: Diagnosis not present

## 2019-09-18 DIAGNOSIS — E785 Hyperlipidemia, unspecified: Secondary | ICD-10-CM | POA: Diagnosis not present

## 2019-09-18 DIAGNOSIS — Z7982 Long term (current) use of aspirin: Secondary | ICD-10-CM | POA: Diagnosis not present

## 2019-09-18 DIAGNOSIS — I495 Sick sinus syndrome: Secondary | ICD-10-CM | POA: Diagnosis not present

## 2019-09-18 DIAGNOSIS — Z955 Presence of coronary angioplasty implant and graft: Secondary | ICD-10-CM | POA: Diagnosis not present

## 2019-09-18 DIAGNOSIS — Z8719 Personal history of other diseases of the digestive system: Secondary | ICD-10-CM | POA: Diagnosis not present

## 2019-09-18 DIAGNOSIS — I441 Atrioventricular block, second degree: Secondary | ICD-10-CM | POA: Diagnosis not present

## 2019-09-18 DIAGNOSIS — I10 Essential (primary) hypertension: Secondary | ICD-10-CM | POA: Diagnosis not present

## 2019-09-18 DIAGNOSIS — K219 Gastro-esophageal reflux disease without esophagitis: Secondary | ICD-10-CM | POA: Diagnosis not present

## 2019-09-18 DIAGNOSIS — M109 Gout, unspecified: Secondary | ICD-10-CM | POA: Diagnosis not present

## 2019-09-18 DIAGNOSIS — I480 Paroxysmal atrial fibrillation: Secondary | ICD-10-CM | POA: Diagnosis not present

## 2019-09-19 DIAGNOSIS — I480 Paroxysmal atrial fibrillation: Secondary | ICD-10-CM | POA: Diagnosis not present

## 2019-09-19 DIAGNOSIS — Z950 Presence of cardiac pacemaker: Secondary | ICD-10-CM | POA: Diagnosis not present

## 2019-09-19 DIAGNOSIS — K219 Gastro-esophageal reflux disease without esophagitis: Secondary | ICD-10-CM | POA: Diagnosis not present

## 2019-09-19 DIAGNOSIS — I495 Sick sinus syndrome: Secondary | ICD-10-CM | POA: Diagnosis not present

## 2019-09-19 DIAGNOSIS — I2510 Atherosclerotic heart disease of native coronary artery without angina pectoris: Secondary | ICD-10-CM | POA: Diagnosis not present

## 2019-09-19 DIAGNOSIS — I441 Atrioventricular block, second degree: Secondary | ICD-10-CM | POA: Diagnosis not present

## 2019-09-19 DIAGNOSIS — Z8719 Personal history of other diseases of the digestive system: Secondary | ICD-10-CM | POA: Diagnosis not present

## 2019-10-07 DIAGNOSIS — I2510 Atherosclerotic heart disease of native coronary artery without angina pectoris: Secondary | ICD-10-CM | POA: Diagnosis not present

## 2019-10-07 DIAGNOSIS — I10 Essential (primary) hypertension: Secondary | ICD-10-CM | POA: Diagnosis not present

## 2019-10-07 DIAGNOSIS — I200 Unstable angina: Secondary | ICD-10-CM | POA: Diagnosis not present

## 2019-10-07 DIAGNOSIS — I480 Paroxysmal atrial fibrillation: Secondary | ICD-10-CM | POA: Diagnosis not present

## 2019-10-07 DIAGNOSIS — I495 Sick sinus syndrome: Secondary | ICD-10-CM | POA: Diagnosis not present

## 2019-10-11 DIAGNOSIS — Z23 Encounter for immunization: Secondary | ICD-10-CM | POA: Diagnosis not present

## 2019-10-22 ENCOUNTER — Ambulatory Visit (INDEPENDENT_AMBULATORY_CARE_PROVIDER_SITE_OTHER): Payer: Medicare Other | Admitting: Adult Health Nurse Practitioner

## 2019-10-22 ENCOUNTER — Other Ambulatory Visit: Payer: Self-pay

## 2019-10-22 ENCOUNTER — Ambulatory Visit: Payer: Medicare Other | Admitting: Adult Health

## 2019-10-22 ENCOUNTER — Encounter: Payer: Self-pay | Admitting: Adult Health Nurse Practitioner

## 2019-10-22 VITALS — BP 124/64 | HR 75 | Temp 97.7°F | Ht 72.0 in | Wt 200.8 lb

## 2019-10-22 DIAGNOSIS — M1000 Idiopathic gout, unspecified site: Secondary | ICD-10-CM

## 2019-10-22 DIAGNOSIS — Z955 Presence of coronary angioplasty implant and graft: Secondary | ICD-10-CM

## 2019-10-22 DIAGNOSIS — R2689 Other abnormalities of gait and mobility: Secondary | ICD-10-CM | POA: Diagnosis not present

## 2019-10-22 DIAGNOSIS — R809 Proteinuria, unspecified: Secondary | ICD-10-CM

## 2019-10-22 DIAGNOSIS — Z79899 Other long term (current) drug therapy: Secondary | ICD-10-CM | POA: Diagnosis not present

## 2019-10-22 DIAGNOSIS — I10 Essential (primary) hypertension: Secondary | ICD-10-CM | POA: Diagnosis not present

## 2019-10-22 DIAGNOSIS — I2510 Atherosclerotic heart disease of native coronary artery without angina pectoris: Secondary | ICD-10-CM | POA: Diagnosis not present

## 2019-10-22 DIAGNOSIS — N3281 Overactive bladder: Secondary | ICD-10-CM

## 2019-10-22 DIAGNOSIS — G214 Vascular parkinsonism: Secondary | ICD-10-CM

## 2019-10-22 DIAGNOSIS — E663 Overweight: Secondary | ICD-10-CM | POA: Diagnosis not present

## 2019-10-22 DIAGNOSIS — E782 Mixed hyperlipidemia: Secondary | ICD-10-CM

## 2019-10-22 DIAGNOSIS — E559 Vitamin D deficiency, unspecified: Secondary | ICD-10-CM | POA: Diagnosis not present

## 2019-10-22 NOTE — Progress Notes (Signed)
FOLLOW UP 3 MONTH  Assessment / Plan   Diagnoses and all orders for this visit:  Leroy Griffin was seen today for follow-up.  Essential hypertension Continue current medications: Monitor blood pressure at home; call if consistently over 130/80 Continue DASH diet.   Reminder to go to the ER if any CP, SOB, nausea, dizziness, severe HA, changes vision/speech, left arm numbness and tingling and jaw pain. -     CBC with Differential/Platelet -     COMPLETE METABOLIC PANEL WITH GFR -     Magnesium  Hyperlipidemia, mixed Continue medications:atorvastatin 80mg  Discussed dietary and exercise modifications Low fat diet -     Lipid panel  Vitamin D deficiency Continue supplementation Taking Vitamin D 8,000 IU daily -     VITAMIN D 25 Hydroxy (Vit-D Deficiency, Fractures)  Coronary artery disease involving native coronary artery of native heart without angina pectoris -Follows with Cardiology Next appointment with Dr   Idiopathic gout, unspecified chronicity, unspecified site No recent flares Continue to monitor  Overactive bladder Taking Myrbetriq Unsure of effectiveness Discussed trial of stopping?  S/P drug eluting coronary stent placement Follow up with Cardiology  Vascular parkinsonism [facility]) Evaluation by Dr Tat Follow up PRN  Other abnormalities of gait and mobility Uses walker when he leaves home or unfamiliar Discussed safety around the home  Proteinuria, unspecified type Follows with Urology, Chronic  Overweight (BMI 25.0-29.9) Discussed dietary and exercise modifications  Medication management Continued   Future Appointments  Date Time Provider Department Center  01/22/2020 11:15 AM 01/24/2020, NP [facility] None  08/18/2020  2:00 PM 10/16/2020, MD [facility] None      Subjective:  Leroy Griffin is a 74 y.o. male who presents accompanied for 3 month follow up for HTN, HLD, prediabetes, and vitamin D Def.   Noted from previous OV: He had  sepsis consequent of obstructed bile duct and cholecystitis in 07/2018 and reports recovery has been slow, has completed home therapy, also reports he underwent LSVT BIG treatment that he finished up a few weeks ago, but reports he found this very beneficial and would like referral back to continue.   Patient has also been evaluated by Dr Tat for Tremor - presumed Hereditary or Essential and she ruled out Parkinson's. Because of gait difficulty, he also had high volume LP w/o any improvement in his gait. He is also now followed by Dr. 08/2018 for back pain and bilateral knee pain. He has hx of ankylosing spondylitis with bone spurs remotely, discussed surgery but ultimately declined. Has valium PRN.    Today: Reports he no longer follows with Dr tat and she will see hi PRN if needed.  Patient had negative screening stress test and Cardiac MRI in Feb 2017 per Dr Mar 2017, however demonstrated unstable angina recently in 11/2017 and underwent diagnostic heart cath by Dr. 12/2017. Cair which demonstrated high-grade stenosis of distal LAD (85-90%) and diagonal (90%) - stent was placed to LAD with balloon inflation of diagonal with improvement to 50%. Continues to take bASA.  He is s/p pacemaker and reports he has not had any improvement since with energy or shortness of breath. He follows up with Dr Satira Sark in 3-4 months.  Has urinary frequency, nocturia x 5-8, very rare incontinence if he waits too long. Hesitancy, dribbling, weak stream. - has seen Dr. Chales Abrahams.  Reports normal prostate, has small bladder and OAB, has been restarted on Myrbetriq.  He reports he is not sure this is helping.  He continues to  take this medication, discussed trial d/c to see if symptoms inrease?     BMI is Body mass index is 27.23 kg/m., he has been working on diet and exercise. Wife is strictly watching his diet for heart healthy diet and portion control.  Wt Readings from Last 3 Encounters:  10/22/19 200 lb 12.8 oz (91.1 kg)  07/23/19  201 lb 12.8 oz (91.5 kg)  01/17/19 198 lb (89.8 kg)   His blood pressure has been controlled at home, today their BP is BP: 124/64 He denies chest pain, shortness of breath, dizziness.  He is on cholesterol medication (atorvastatin 40 mg daily) and denies myalgias. His cholesterol is at goal. The cholesterol last visit was:   Lab Results  Component Value Date   CHOL 134 10/22/2019   HDL 34 (L) 10/22/2019   LDLCALC 63 10/22/2019   TRIG 280 (H) 10/22/2019   CHOLHDL 3.9 10/22/2019   He has been working on diet and exercise for glucose management, and denies paresthesia of the feet, polydipsia and polyuria. Last A1C in the office was:  Lab Results  Component Value Date   HGBA1C 5.5 07/23/2019   Patient also has Idiopathic proteinuria & CKD2 followed by Dr Raymondo Band.  Lab Results  Component Value Date   GFRNONAA 57 (L) 10/22/2019   Patient is on Vitamin D supplement.   Lab Results  Component Value Date   VD25OH 53 10/22/2019       Medication Review: Current Outpatient Medications on File Prior to Visit  Medication Sig Dispense Refill  . aspirin EC 81 MG tablet Take 81 mg by mouth daily.    Marland Kitchen atorvastatin (LIPITOR) 80 MG tablet Take 1 tablet Daily for Cholesterol 90 tablet 3  . Cholecalciferol (VITAMIN D) 2000 units tablet Take 8,000 Units by mouth every morning.     . diazepam (VALIUM) 5 MG tablet Take 1 tablet 3 x /day for Hereditary Tremor 270 tablet 1  . diclofenac sodium (VOLTAREN) 1 % GEL Apply 4 g topically as needed.    . isosorbide mononitrate (IMDUR) 30 MG 24 hr tablet TAKE 1 TABLET(30 MG) BY MOUTH DAILY    . MAGNESIUM PO Take 500 mg by mouth 2 (two) times daily.     . metoprolol succinate (TOPROL-XL) 25 MG 24 hr tablet Take 1 tablet by mouth daily.    . Minoxidil (ROGAINE MENS) 5 % FOAM Apply 1 application topically daily.    Marland Kitchen MYRBETRIQ 50 MG TB24 tablet TAKE 1 TABLET(50 MG) BY MOUTH DAILY 30 tablet 5  . nitroGLYCERIN (NITROSTAT) 0.4 MG SL tablet Place 1  tablet under the tongue every 5 (five) minutes as needed. For chest pain    . Omega-3 Fatty Acids (FISH OIL) 1200 MG CPDR Take 2 capsules by mouth at bedtime.     Marland Kitchen omeprazole (PRILOSEC) 20 MG capsule TAKE 1 CAPSULE BY MOUTH DAILY 90 capsule 0  . traMADol (ULTRAM) 50 MG tablet TAKE 1 TABLET BY MOUTH EVERY 6 HOURS AS NEEDED 30 tablet 0  . triamcinolone ointment (KENALOG) 0.1 % Apply 1 application topically 3 (three) times daily. (Patient taking differently: Apply 1 application topically 2 (two) times daily as needed. ) 80 g 3  . vitamin B-12 (CYANOCOBALAMIN) 1000 MCG tablet Take 1,000 mcg by mouth daily.     No current facility-administered medications on file prior to visit.    Current Problems (verified) Patient Active Problem List   Diagnosis Date Noted  . Other abnormalities of gait and mobility 01/17/2019  .  Depression due to physical illness 01/17/2019  . Overactive bladder 01/11/2018  . S/P drug eluting coronary stent placement 11/21/2017  . CAD in native artery 11/20/2017  . Anxiety 11/14/2017  . Overweight (BMI 25.0-29.9) 08/26/2014  . Sinus pause   With blocked PACs, PVCs and sinus arrhythmia 10/10/2013  . Medication management 10/06/2013  . Hyperlipidemia 08/27/2013  . Essential tremor   . Proteinuria   . Other abnormal glucose   . Gout   . Vitamin D deficiency   . Essential hypertension 10/10/2010    Screening Tests Immunization History  Administered Date(s) Administered  . Influenza Split 05/23/2013  . Influenza, High Dose Seasonal PF 05/14/2014, 06/11/2015, 05/19/2016, 06/10/2017, 05/20/2019  . Pneumococcal Conjugate-13 12/03/2014  . Pneumococcal Polysaccharide-23 06/01/2010  . Td 06/01/2010  . Tdap 01/28/2014  . Zoster 08/08/2004    Preventative care: Last colonoscopy: 2013 due 10 years EGD: 2019 Echo 08/2015 Stress test 09/2015 Cardiac cath 11/2017  Prior vaccinations: TD or Tdap: 2015 Influenza: 2019 at cardiology Pneumococcal: 2011 Prevnar  2016 Shingles/Zostavax: 2006  COVID19- 1 of 2 left Second one in couple days.   Names of Other Physician/Practitioners you currently use: 1. [facility] here for primary care 2. Dr. Neale Burly, [facility] eye doctor, last visit 2019 3. Dr. Pollyann Kennedy , dentist, last visit 2020, q 6 months  Patient Care Team: Lucky Cowboy, MD as PCP - General (Internal Medicine) Sharyn Lull, Elvin So, MD as Referring Physician (Dermatology) Mittie Bodo, MD as Referring Physician (Cardiology) Annie Sable, MD as Consulting Physician (Nephrology) Marney Setting, MD as Referring Physician (Gastroenterology)   Allergies Allergies  Allergen Reactions  . Enalapril Rash  . Aspirin     Internal bleeding  . Nsaids     Internal bleeding  . Welchol [Colesevelam Hcl] Diarrhea  . Vasotec [Enalaprilat] Rash    SURGICAL HISTORY He  has a past surgical history that includes Knee surgery (Right); Tonsillectomy; Cyst excision; and Cholecystectomy. FAMILY HISTORY His family history includes Dementia in his mother; Emphysema in his brother; Heart disease in his brother; Stroke in his father. SOCIAL HISTORY He  reports that he has never smoked. He has never used smokeless tobacco. He reports previous alcohol use of about 14.0 standard drinks of alcohol per week. He reports that he does not use drugs.   Objective:   Blood pressure 124/64, pulse 75, temperature 97.7 F (36.5 C), height 6' (1.829 m), weight 200 lb 12.8 oz (91.1 kg), SpO2 96 %. Body mass index is 27.23 kg/m.  General appearance: alert, no distress, WD/WN, male, Mask facies HEENT: normocephalic, sclerae anicteric, TMs pearly, nares patent, no discharge or erythema, pharynx normal Oral cavity: MMM, no lesions Neck: supple, no lymphadenopathy, no thyromegaly, no masses Heart: RRR, normal S1, S2, no murmurs Lungs: CTA bilaterally, no wheezes, rhonchi, or rales Abdomen: +bs, soft, non  tender, non distended, no masses, no hepatomegaly, no splenomegaly Musculoskeletal: nontender, no swelling, no obvious deformity Extremities: no edema, no cyanosis, no clubbing Pulses: 2+ symmetric, upper and lower extremities, normal cap refill Neurological: alert, oriented x 3, CN2-12 intact, strength normal upper extremities and lower extremities, , mild tremor of the right hand present, sensation normal throughout, DTRs 2+ throughout, no cerebellar signs, shuffling bent over gait with walker Psychiatric: normal affect, behavior normal, pleasant    Elder Negus, NP   10/22/2019

## 2019-10-22 NOTE — Patient Instructions (Addendum)
We will respond with your lab resiults in 1-3 days.  There are currently 118 elements in the periodic table.    Vit D  & Vit C 1,000 mg   are recommended to help protect  against the Covid-19 and other Corona viruses.    Also it's recommended  to take  Zinc 50 mg  to help  protect against the Covid-19   and best place to get  is also on [email or web address]  and don't pay more than 6-8 cents /pill !  ================================ Coronavirus (COVID-19) Are you at risk?  Are you at risk for the Coronavirus (COVID-19)?  To be considered HIGH RISK for Coronavirus (COVID-19), you have to meet the following criteria:  . Traveled to [place], [place], [place], [place] or [place]; or in the [place] to [place], [place], [place]  . or [place]; and have fever, cough, and shortness of breath within the last 2 weeks of travel OR . Been in close contact with a person diagnosed with COVID-19 within the last 2 weeks and have  . fever, cough,and shortness of breath .  . IF YOU DO NOT MEET THESE CRITERIA, YOU ARE CONSIDERED LOW RISK FOR COVID-19.  What to do if you are HIGH RISK for COVID-19?  Marland Kitchen If you are having a medical emergency, call 911. . Seek medical care right away. Before you go to a doctor's office, urgent care or emergency department, .  call ahead and tell them about your recent travel, contact with someone diagnosed with COVID-19  .  and your symptoms.  . You should receive instructions from your physician's office regarding next steps of care.  . When you arrive at healthcare provider, tell the healthcare staff immediately you have returned from  . visiting [place], [place], [place], [place] or [place]; or traveled in the [place] to [place], [place],  . [place] or [place] in the last two weeks or you have been in close contact with a person diagnosed with  . COVID-19 in the last 2 weeks.   . Tell the health care staff about your symptoms: fever, cough and  shortness of breath. . After you have been seen by a medical provider, you will be either: o Tested for (COVID-19) and discharged home on quarantine except to seek medical care if  o symptoms worsen, and asked to  - Stay home and avoid contact with others until you get your results (4-5 days)  - Avoid travel on public transportation if possible (such as bus, train, or airplane) or o Sent to the Emergency Department by EMS for evaluation, COVID-19 testing  and  o possible admission depending on your condition and test results.  What to do if you are LOW RISK for COVID-19?  Reduce your risk of any infection by using the same precautions used for avoiding the common cold or flu:  Marland Kitchen Wash your hands often with soap and warm water for at least 20 seconds.  If soap and water are not readily available,  . use an alcohol-based hand sanitizer with at least 60% alcohol.  . If coughing or sneezing, cover your mouth and nose by coughing or sneezing into the elbow areas of your shirt or coat, .  into a tissue or into your sleeve (not your hands). . Avoid shaking hands with others and consider head nods or verbal greetings only. . Avoid touching your eyes, nose, or mouth with unwashed hands.  . Avoid close contact  with people who are sick. . Avoid places or events with large numbers of people in one location, like concerts or sporting events. . Carefully consider travel plans you have or are making. . If you are planning any travel outside or inside the [place], visit the CDC's Travelers' Health webpage for the latest health notices. . If you have some symptoms but not all symptoms, continue to monitor at home and seek medical attention  . if your symptoms worsen. . If you are having a medical emergency, call 911. >>>>>>>>>>>>>>>>>>>>>>>>>>>>>>>>>>>>>>>>>>>>>>>>>>>>>>> We Do NOT Approve of  [facility] [place] Soliciting Our Patients  To Do Home Visits  & We Do NOT Approve of [employer] > > > > > > > > > > > > > > > > > > > > > > > > > > > > > > > > > > >  > > > >   Preventive Care for Adults  A healthy lifestyle and preventive care can promote health and wellness. Preventive health guidelines for men include the following key practices:  A routine yearly physical is a good way to check with your health care provider about your health and preventative screening. It is a chance to share any concerns and updates on your health and to receive a thorough exam.  Visit your dentist for a routine exam and preventative care every 6 months. Brush your teeth twice a day and floss once a day. Good oral hygiene prevents tooth decay and gum disease.  The frequency of eye exams is based on your age, health, family medical history, use of contact lenses, and other factors. Follow your health care provider's recommendations for frequency of eye exams.  Eat a healthy diet. Foods such as vegetables, fruits, whole grains, low-fat dairy products, and lean protein foods contain the nutrients you need without too many calories. Decrease your intake of foods high in solid fats, added sugars, and salt. Eat the right amount of calories for you. Get information about a proper diet from your health care provider, if necessary.  Regular physical exercise is one of the most important things you can do for your health. Most adults should get at least 150 minutes of moderate-intensity exercise (any activity that increases your heart rate and causes you to sweat) each week. In addition, most adults need muscle-strengthening exercises on 2 or more days a week.  Maintain a healthy weight. The body mass index (BMI) is a screening tool to identify possible weight problems. It provides an estimate of body fat based on height and weight. Your health care provider can find your BMI and can help you achieve or maintain a healthy weight. For adults 20 years and older:  A BMI below 18.5 is considered  underweight.  A BMI of 18.5 to 24.9 is normal.  A BMI of 25 to 29.9 is considered overweight.  A BMI of 30 and above is considered obese.  Maintain normal blood lipids and cholesterol levels by exercising and minimizing your intake of saturated fat. Eat a balanced diet with plenty of fruit and vegetables. Blood tests for lipids and cholesterol should begin at age 46 and be repeated every 5 years. If your lipid or cholesterol levels are high, you are over 50, or you are at high risk for heart disease, you may need your cholesterol levels checked more frequently. Ongoing high lipid and cholesterol levels should be treated with medicines if diet and exercise are not working.  If  you smoke, find out from your health care provider how to quit. If you do not use tobacco, do not start.  Lung cancer screening is recommended for adults aged 76-80 years who are at high risk for developing lung cancer because of a history of smoking. A yearly low-dose CT scan of the lungs is recommended for people who have at least a 30-pack-year history of smoking and are a current smoker or have quit within the past 15 years. A pack year of smoking is smoking an average of 1 pack of cigarettes a day for 1 year (for example: 1 pack a day for 30 years or 2 packs a day for 15 years). Yearly screening should continue until the smoker has stopped smoking for at least 15 years. Yearly screening should be stopped for people who develop a health problem that would prevent them from having lung cancer treatment.  If you choose to drink alcohol, do not have more than 2 drinks per day. One drink is considered to be 12 ounces (355 mL) of beer, 5 ounces (148 mL) of wine, or 1.5 ounces (44 mL) of liquor.  Avoid use of street drugs. Do not share needles with anyone. Ask for help if you need support or instructions about stopping the use of drugs.  High blood pressure causes heart disease and increases the risk of stroke. Your blood  pressure should be checked at least every 1-2 years. Ongoing high blood pressure should be treated with medicines, if weight loss and exercise are not effective.  If you are 17-42 years old, ask your health care provider if you should take aspirin to prevent heart disease.  Diabetes screening involves taking a blood sample to check your fasting blood sugar level. Testing should be considered at a younger age or be carried out more frequently if you are overweight and have at least 1 risk factor for diabetes.  Colorectal cancer can be detected and often prevented. Most routine colorectal cancer screening begins at the age of 67 and continues through age 8. However, your health care provider may recommend screening at an earlier age if you have risk factors for colon cancer. On a yearly basis, your health care provider may provide home test kits to check for hidden blood in the stool. Use of a small camera at the end of a tube to directly examine the colon (sigmoidoscopy or colonoscopy) can detect the earliest forms of colorectal cancer. Talk to your health care provider about this at age 24, when routine screening begins. Direct exam of the colon should be repeated every 5-10 years through age 47, unless early forms of precancerous polyps or small growths are found.  Hepatitis C blood testing is recommended for all people born from [birth year] through 1967 and any individual with known risks for hepatitis C.  Screening for abdominal aortic aneurysm (AAA)  by ultrasound is recommended for people who have history of high blood pressure or who are current or former smokers.  Healthy men should  receive prostate-specific antigen (PSA) blood tests as part of routine cancer screening. Talk with your health care provider about prostate cancer screening.  Testicular cancer screening is  recommended for adult males. Screening includes self-exam, a health care provider exam, and other screening tests. Consult with your  health care provider about any symptoms you have or any concerns you have about testicular cancer.  Use sunscreen. Apply sunscreen liberally and repeatedly throughout the day. You should seek shade when your shadow is shorter  than you. Protect yourself by wearing long sleeves, pants, a wide-brimmed hat, and sunglasses year round, whenever you are outdoors.  Once a month, do a whole-body skin exam, using a mirror to look at the skin on your back. Tell your health care provider about new moles, moles that have irregular borders, moles that are larger than a pencil eraser, or moles that have changed in shape or color.  Stay current with required vaccines (immunizations).  Influenza vaccine. All adults should be immunized every year.  Tetanus, diphtheria, and acellular pertussis (Td, Tdap) vaccine. An adult who has not previously received Tdap or who does not know his vaccine status should receive 1 dose of Tdap. This initial dose should be followed by tetanus and diphtheria toxoids (Td) booster doses every 10 years. Adults with an unknown or incomplete history of completing a 3-dose immunization series with Td-containing vaccines should begin or complete a primary immunization series including a Tdap dose. Adults should receive a Td booster every 10 years.  Zoster vaccine. One dose is recommended for adults aged 10 years or older unless certain conditions are present.    PREVNAR - Pneumococcal 13-valent conjugate (PCV13) vaccine. When indicated, a person who is uncertain of his immunization history and has no record of immunization should receive the PCV13 vaccine. An adult aged 59 years or older who has certain medical conditions and has not been previously immunized should receive 1 dose of PCV13 vaccine. This PCV13 should be followed with a dose of pneumococcal polysaccharide (PPSV23) vaccine. The PPSV23 vaccine dose should be obtained 1 or more year(s)after the dose of PCV13 vaccine. An adult aged  15 years or older who has certain medical conditions and previously received 1 or more doses of PPSV23 vaccine should receive 1 dose of PCV13. The PCV13 vaccine dose should be obtained 1 or more years after the last PPSV23 vaccine dose.    PNEUMOVAX - Pneumococcal polysaccharide (PPSV23) vaccine. When PCV13 is also indicated, PCV13 should be obtained first. All adults aged 74 years and older should be immunized. An adult younger than age 46 years who has certain medical conditions should be immunized. Any person who resides in a nursing home or long-term care facility should be immunized. An adult smoker should be immunized. People with an immunocompromised condition and certain other conditions should receive both PCV13 and PPSV23 vaccines. People with human immunodeficiency virus (HIV) infection should be immunized as soon as possible after diagnosis. Immunization during chemotherapy or radiation therapy should be avoided. Routine use of PPSV23 vaccine is not recommended for American Indians, [place] Natives, or people younger than 65 years unless there are medical conditions that require PPSV23 vaccine. When indicated, people who have unknown immunization and have no record of immunization should receive PPSV23 vaccine. One-time revaccination 5 years after the first dose of PPSV23 is recommended for people aged 19-64 years who have chronic kidney failure, nephrotic syndrome, asplenia, or immunocompromised conditions. People who received 1-2 doses of PPSV23 before age 13 years should receive another dose of PPSV23 vaccine at age 23 years or later if at least 5 years have passed since the previous dose. Doses of PPSV23 are not needed for people immunized with PPSV23 at or after age 64 years.    Hepatitis A vaccine. Adults who wish to be protected from this disease, have certain high-risk conditions, work with hepatitis A-infected animals, work in hepatitis A research labs, or travel to or work in countries  with a high rate of hepatitis A should  be immunized. Adults who were previously unvaccinated and who anticipate close contact with an international adoptee during the first 60 days after arrival in the [place] from a country with a high rate of hepatitis A should be immunized.    Hepatitis B vaccine. Adults should be immunized if they wish to be protected from this disease, have certain high-risk conditions, may be exposed to blood or other infectious body fluids, are household contacts or sex partners of hepatitis B positive people, are clients or workers in certain care facilities, or travel to or work in countries with a high rate of hepatitis B.   Preventive Service / Frequency   Ages 75 and over  Blood pressure check.  Lipid and cholesterol check.  Lung cancer screening. / Every year if you are aged 92-80 years and have a 30-pack-year history of smoking and currently smoke or have quit within the past 15 years. Yearly screening is stopped once you have quit smoking for at least 15 years or develop a health problem that would prevent you from having lung cancer treatment.  Fecal occult blood test (FOBT) of stool. You may not have to do this test if you get a colonoscopy every 10 years.  Flexible sigmoidoscopy** or colonoscopy.** / Every 5 years for a flexible sigmoidoscopy or every 10 years for a colonoscopy beginning at age 59 and continuing until age 73.  Hepatitis C blood test.** / For all people born from [birth year] through 1967 and any individual with known risks for hepatitis C.  Abdominal aortic aneurysm (AAA) screening./ Screening current or former smokers or have Hypertension.  Skin self-exam. / Monthly.  Influenza vaccine. / Every year.  Tetanus, diphtheria, and acellular pertussis (Tdap/Td) vaccine.** / 1 dose of Td every 10 years.   Zoster vaccine.** / 1 dose for adults aged 46 years or older.         Pneumococcal 13-valent conjugate (PCV13) vaccine.     Pneumococcal polysaccharide (PPSV23) vaccine.     Hepatitis A vaccine.** / Consult your health care provider.  Hepatitis B vaccine.** / Consult your health care provider. Screening for abdominal aortic aneurysm (AAA)  by ultrasound is recommended for people who have history of high blood pressure or who are current or former smokers. ++++++++++ Recommend Adult Low Dose Aspirin or  coated  Aspirin 81 mg daily  To reduce risk of Colon Cancer 40 %,  Skin Cancer 26 % ,  Malignant Melanoma 46%  and  Pancreatic cancer 60% ++++++++++++++++++++++ Vitamin D goal  is between 70-100.  Please make sure that you are taking your Vitamin D as directed.  It is very important as a natural anti-inflammatory  helping hair, skin, and nails, as well as reducing stroke and heart attack risk.  It helps your bones and helps with mood. It also decreases numerous cancer risks so please take it as directed.  Low Vit D is associated with a 200-300% higher risk for CANCER  and 200-300% higher risk for HEART   ATTACK  &  STROKE.   .....................................Marland Kitchen It is also associated with higher death rate at younger ages,  autoimmune diseases like Rheumatoid arthritis, Lupus, Multiple Sclerosis.    Also many other serious conditions, like depression, Alzheimer's Dementia, infertility, muscle aches, fatigue, fibromyalgia - just to name a few. ++++++++++++++++++++++ Recommend the book "The END of DIETING" by Dr Excell Seltzer  & the book "The END of DIABETES " by Dr Excell Seltzer At [email or web address] - get book & Audio CD's  Being diabetic has a  300% increased risk for heart attack, stroke, cancer, and alzheimer- type vascular dementia. It is very important that you work harder with diet by avoiding all foods that are white. Avoid white rice (brown & wild rice is OK), white potatoes (sweetpotatoes in moderation is OK), White bread or wheat bread or anything made out of white flour like bagels, donuts,  rolls, buns, biscuits, cakes, pastries, cookies, pizza crust, and pasta (made from white flour & egg whites) - vegetarian pasta or spinach or wheat pasta is OK. Multigrain breads like Arnold's or [employer], or multigrain sandwich thins or flatbreads.  Diet, exercise and weight loss can reverse and cure diabetes in the early stages.  Diet, exercise and weight loss is very important in the control and prevention of complications of diabetes which affects every system in your body, ie. Brain - dementia/stroke, eyes - glaucoma/blindness, heart - heart attack/heart failure, kidneys - dialysis, stomach - gastric paralysis, intestines - malabsorption, nerves - severe painful neuritis, circulation - gangrene & loss of a leg(s), and finally cancer and Alzheimers.    I recommend avoid fried & greasy foods,  sweets/candy, white rice (brown or wild rice or Quinoa is OK), white potatoes (sweet potatoes are OK) - anything made from white flour - bagels, doughnuts, rolls, buns, biscuits,white and wheat breads, pizza crust and traditional pasta made of white flour & egg white(vegetarian pasta or spinach or wheat pasta is OK).  Multi-grain bread is OK - like multi-grain flat bread or sandwich thins. Avoid alcohol in excess. Exercise is also important.    Eat all the vegetables you want - avoid meat, especially red meat and dairy - especially cheese.  Cheese is the most concentrated form of trans-fats which is the worst thing to clog up our arteries. Veggie cheese is OK which can be found in the fresh produce section at [employer] or [employer] or [employer]  ++++++++++++++++++++++ DASH Eating Plan  DASH stands for "Dietary Approaches to Stop Hypertension."   The DASH eating plan is a healthy eating plan that has been shown to reduce high blood pressure (hypertension). Additional health benefits may include reducing the risk of type 2 diabetes mellitus, heart disease, and stroke. The DASH eating plan may also help  with weight loss. WHAT DO I NEED TO KNOW ABOUT THE DASH EATING PLAN? For the DASH eating plan, you will follow these general guidelines:  Choose foods with a percent daily value for sodium of less than 5% (as listed on the food label).  Use salt-free seasonings or herbs instead of table salt or sea salt.  Check with your health care provider or pharmacist before using salt substitutes.  Eat lower-sodium products, often labeled as "lower sodium" or "no salt added."  Eat fresh foods.  Eat more vegetables, fruits, and low-fat dairy products.  Choose whole grains. Look for the word "whole" as the first word in the ingredient list.  Choose fish   Limit sweets, desserts, sugars, and sugary drinks.  Choose heart-healthy fats.  Eat veggie cheese   Eat more home-cooked food and less restaurant, buffet, and fast food.  Limit fried foods.  Cook foods using methods other than frying.  Limit canned vegetables. If you do use them, rinse them well to decrease the sodium.  When eating at a restaurant, ask that your food be prepared with less salt, or no salt if possible.  WHAT FOODS CAN I EAT? Read Dr Fara Olden Fuhrman's books on The End of Dieting & The End of Diabetes  Grains Whole grain or whole wheat bread. Brown rice. Whole grain or whole wheat pasta. Quinoa, bulgur, and whole grain cereals. Low-sodium cereals. Corn or whole wheat flour tortillas. Whole grain cornbread. Whole grain crackers. Low-sodium crackers.  Vegetables Fresh or frozen vegetables (raw, steamed, roasted, or grilled). Low-sodium or reduced-sodium tomato and vegetable juices. Low-sodium or reduced-sodium tomato sauce and paste. Low-sodium or reduced-sodium canned vegetables.   Fruits All fresh, canned (in natural juice), or frozen fruits.  Protein Products  All fish and seafood.  Dried beans, peas, or lentils. Unsalted nuts and seeds. Unsalted canned beans.  Dairy Low-fat dairy products, such  as skim or 1% milk, 2% or reduced-fat cheeses, low-fat ricotta or cottage cheese, or plain low-fat yogurt. Low-sodium or reduced-sodium cheeses.  Fats and Oils Tub margarines without trans fats. Light or reduced-fat mayonnaise and salad dressings (reduced sodium). Avocado. Safflower, olive, or canola oils. Natural peanut or almond butter.  Other Unsalted popcorn and pretzels. The items listed above may not be a complete list of recommended foods or beverages. Contact your dietitian for more options.  ++++++++++++++++++++  WHAT FOODS ARE NOT RECOMMENDED? Grains/ White flour or wheat flour White bread. White pasta. White rice. Refined cornbread. Bagels and croissants. Crackers that contain trans fat.  Vegetables  Creamed or fried vegetables. Vegetables in a . Regular canned vegetables. Regular canned tomato sauce and paste. Regular tomato and vegetable juices.  Fruits Dried fruits. Canned fruit in light or heavy syrup. Fruit juice.  Meat and Other Protein Products Meat in general - RED meat & White meat.  Fatty cuts of meat. Ribs, chicken wings, all processed meats as bacon, sausage, bologna, salami, fatback, hot dogs, bratwurst and packaged luncheon meats.  Dairy Whole or 2% milk, cream, half-and-half, and cream cheese. Whole-fat or sweetened yogurt. Full-fat cheeses or blue cheese. Non-dairy creamers and whipped toppings. Processed cheese, cheese spreads, or cheese curds.  Condiments Onion and garlic salt, seasoned salt, table salt, and sea salt. Canned and packaged gravies. Worcestershire sauce. Tartar sauce. Barbecue sauce. Teriyaki sauce. Soy sauce, including reduced sodium. Steak sauce. Fish sauce. Oyster sauce. Cocktail sauce. Horseradish. Ketchup and mustard. Meat flavorings and tenderizers. Bouillon cubes. Hot sauce. Tabasco sauce. Marinades. Taco seasonings. Relishes.  Fats and Oils Butter, stick margarine, lard, shortening and bacon fat. Coconut, palm kernel, or palm oils.  Regular salad dressings.  Pickles and olives. Salted popcorn and pretzels.  The items listed above may not be a complete list of foods and beverages to avoid.

## 2019-10-23 ENCOUNTER — Other Ambulatory Visit (INDEPENDENT_AMBULATORY_CARE_PROVIDER_SITE_OTHER): Payer: Self-pay | Admitting: Specialist

## 2019-10-23 LAB — CBC WITH DIFFERENTIAL/PLATELET
Absolute Monocytes: 517 cells/uL (ref 200–950)
Basophils Absolute: 39 cells/uL (ref 0–200)
Basophils Relative: 0.7 %
Eosinophils Absolute: 358 cells/uL (ref 15–500)
Eosinophils Relative: 6.5 %
HCT: 42.7 % (ref 38.5–50.0)
Hemoglobin: 14.6 g/dL (ref 13.2–17.1)
Lymphs Abs: 1634 cells/uL (ref 850–3900)
MCH: 32.6 pg (ref 27.0–33.0)
MCHC: 34.2 g/dL (ref 32.0–36.0)
MCV: 95.3 fL (ref 80.0–100.0)
MPV: 10.1 fL (ref 7.5–12.5)
Monocytes Relative: 9.4 %
Neutro Abs: 2954 cells/uL (ref 1500–7800)
Neutrophils Relative %: 53.7 %
Platelets: 159 10*3/uL (ref 140–400)
RBC: 4.48 10*6/uL (ref 4.20–5.80)
RDW: 13 % (ref 11.0–15.0)
Total Lymphocyte: 29.7 %
WBC: 5.5 10*3/uL (ref 3.8–10.8)

## 2019-10-23 LAB — LIPID PANEL
Cholesterol: 134 mg/dL (ref <200)
HDL: 34 mg/dL — ABNORMAL LOW (ref >=40)
LDL Cholesterol (Calc): 63 mg/dL (calc)
Non-HDL Cholesterol (Calc): 100 mg/dL (calc) (ref <130)
Total CHOL/HDL Ratio: 3.9 (calc) (ref <5.0)
Triglycerides: 280 mg/dL — ABNORMAL HIGH (ref <150)

## 2019-10-23 LAB — COMPLETE METABOLIC PANEL WITH GFR
AG Ratio: 1.5 (calc) (ref 1.0–2.5)
ALT: 20 U/L (ref 9–46)
AST: 21 U/L (ref 10–35)
Albumin: 3.8 g/dL (ref 3.6–5.1)
Alkaline phosphatase (APISO): 80 U/L (ref 35–144)
BUN/Creatinine Ratio: 21 (calc) (ref 6–22)
BUN: 26 mg/dL — ABNORMAL HIGH (ref 7–25)
CO2: 33 mmol/L — ABNORMAL HIGH (ref 20–32)
Calcium: 10.2 mg/dL (ref 8.6–10.3)
Chloride: 105 mmol/L (ref 98–110)
Creat: 1.23 mg/dL — ABNORMAL HIGH (ref 0.70–1.18)
GFR, Est African American: 66 mL/min/{1.73_m2} (ref >=60)
GFR, Est Non African American: 57 mL/min/{1.73_m2} — ABNORMAL LOW (ref >=60)
Globulin: 2.5 g/dL (calc) (ref 1.9–3.7)
Glucose, Bld: 92 mg/dL (ref 65–99)
Potassium: 5.4 mmol/L — ABNORMAL HIGH (ref 3.5–5.3)
Sodium: 142 mmol/L (ref 135–146)
Total Bilirubin: 0.6 mg/dL (ref 0.2–1.2)
Total Protein: 6.3 g/dL (ref 6.1–8.1)

## 2019-10-23 LAB — MAGNESIUM: Magnesium: 1.8 mg/dL (ref 1.5–2.5)

## 2019-10-23 LAB — VITAMIN D 25 HYDROXY (VIT D DEFICIENCY, FRACTURES): Vit D, 25-Hydroxy: 53 ng/mL (ref 30–100)

## 2019-11-01 DIAGNOSIS — Z23 Encounter for immunization: Secondary | ICD-10-CM | POA: Diagnosis not present

## 2019-11-03 DIAGNOSIS — K269 Duodenal ulcer, unspecified as acute or chronic, without hemorrhage or perforation: Secondary | ICD-10-CM | POA: Diagnosis not present

## 2019-11-03 DIAGNOSIS — J449 Chronic obstructive pulmonary disease, unspecified: Secondary | ICD-10-CM | POA: Diagnosis not present

## 2019-11-03 DIAGNOSIS — Z955 Presence of coronary angioplasty implant and graft: Secondary | ICD-10-CM | POA: Diagnosis not present

## 2019-11-03 DIAGNOSIS — Z20822 Contact with and (suspected) exposure to covid-19: Secondary | ICD-10-CM | POA: Diagnosis present

## 2019-11-03 DIAGNOSIS — K254 Chronic or unspecified gastric ulcer with hemorrhage: Secondary | ICD-10-CM | POA: Diagnosis not present

## 2019-11-03 DIAGNOSIS — R748 Abnormal levels of other serum enzymes: Secondary | ICD-10-CM | POA: Diagnosis not present

## 2019-11-03 DIAGNOSIS — I959 Hypotension, unspecified: Secondary | ICD-10-CM | POA: Diagnosis not present

## 2019-11-03 DIAGNOSIS — E861 Hypovolemia: Secondary | ICD-10-CM | POA: Diagnosis not present

## 2019-11-03 DIAGNOSIS — M1009 Idiopathic gout, multiple sites: Secondary | ICD-10-CM | POA: Diagnosis present

## 2019-11-03 DIAGNOSIS — Z950 Presence of cardiac pacemaker: Secondary | ICD-10-CM | POA: Diagnosis not present

## 2019-11-03 DIAGNOSIS — E785 Hyperlipidemia, unspecified: Secondary | ICD-10-CM | POA: Diagnosis not present

## 2019-11-03 DIAGNOSIS — K920 Hematemesis: Secondary | ICD-10-CM | POA: Diagnosis not present

## 2019-11-03 DIAGNOSIS — Z8719 Personal history of other diseases of the digestive system: Secondary | ICD-10-CM | POA: Diagnosis not present

## 2019-11-03 DIAGNOSIS — I480 Paroxysmal atrial fibrillation: Secondary | ICD-10-CM | POA: Diagnosis present

## 2019-11-03 DIAGNOSIS — D500 Iron deficiency anemia secondary to blood loss (chronic): Secondary | ICD-10-CM | POA: Diagnosis not present

## 2019-11-03 DIAGNOSIS — Z79891 Long term (current) use of opiate analgesic: Secondary | ICD-10-CM | POA: Diagnosis not present

## 2019-11-03 DIAGNOSIS — D62 Acute posthemorrhagic anemia: Secondary | ICD-10-CM | POA: Diagnosis present

## 2019-11-03 DIAGNOSIS — I129 Hypertensive chronic kidney disease with stage 1 through stage 4 chronic kidney disease, or unspecified chronic kidney disease: Secondary | ICD-10-CM | POA: Diagnosis not present

## 2019-11-03 DIAGNOSIS — I9589 Other hypotension: Secondary | ICD-10-CM | POA: Diagnosis not present

## 2019-11-03 DIAGNOSIS — Z886 Allergy status to analgesic agent status: Secondary | ICD-10-CM | POA: Diagnosis not present

## 2019-11-03 DIAGNOSIS — N1831 Chronic kidney disease, stage 3a: Secondary | ICD-10-CM | POA: Diagnosis not present

## 2019-11-03 DIAGNOSIS — E7800 Pure hypercholesterolemia, unspecified: Secondary | ICD-10-CM | POA: Diagnosis present

## 2019-11-03 DIAGNOSIS — I10 Essential (primary) hypertension: Secondary | ICD-10-CM | POA: Diagnosis present

## 2019-11-03 DIAGNOSIS — I495 Sick sinus syndrome: Secondary | ICD-10-CM | POA: Diagnosis present

## 2019-11-03 DIAGNOSIS — K264 Chronic or unspecified duodenal ulcer with hemorrhage: Secondary | ICD-10-CM | POA: Diagnosis not present

## 2019-11-03 DIAGNOSIS — Z8711 Personal history of peptic ulcer disease: Secondary | ICD-10-CM | POA: Diagnosis not present

## 2019-11-03 DIAGNOSIS — Z888 Allergy status to other drugs, medicaments and biological substances status: Secondary | ICD-10-CM | POA: Diagnosis not present

## 2019-11-03 DIAGNOSIS — N179 Acute kidney failure, unspecified: Secondary | ICD-10-CM | POA: Diagnosis present

## 2019-11-03 DIAGNOSIS — K922 Gastrointestinal hemorrhage, unspecified: Secondary | ICD-10-CM | POA: Diagnosis not present

## 2019-11-03 DIAGNOSIS — F419 Anxiety disorder, unspecified: Secondary | ICD-10-CM | POA: Diagnosis present

## 2019-11-03 DIAGNOSIS — K219 Gastro-esophageal reflux disease without esophagitis: Secondary | ICD-10-CM | POA: Diagnosis present

## 2019-11-03 DIAGNOSIS — K260 Acute duodenal ulcer with hemorrhage: Secondary | ICD-10-CM | POA: Diagnosis not present

## 2019-11-03 DIAGNOSIS — Z7982 Long term (current) use of aspirin: Secondary | ICD-10-CM | POA: Diagnosis not present

## 2019-11-03 DIAGNOSIS — I2510 Atherosclerotic heart disease of native coronary artery without angina pectoris: Secondary | ICD-10-CM | POA: Diagnosis present

## 2019-11-05 MED ORDER — ALUM & MAG HYDROXIDE-SIMETH 200-200-20 MG/5ML PO SUSP
30.00 | ORAL | Status: DC
Start: ? — End: 2019-11-05

## 2019-11-05 MED ORDER — GENERIC EXTERNAL MEDICATION
8.00 | Status: DC
Start: ? — End: 2019-11-05

## 2019-11-05 MED ORDER — GENERIC EXTERNAL MEDICATION
Status: DC
Start: ? — End: 2019-11-05

## 2019-11-05 MED ORDER — SODIUM CHLORIDE 0.9 % IV SOLN
10.00 | INTRAVENOUS | Status: DC
Start: ? — End: 2019-11-05

## 2019-11-05 MED ORDER — SENNOSIDES-DOCUSATE SODIUM 8.6-50 MG PO TABS
1.00 | ORAL_TABLET | ORAL | Status: DC
Start: ? — End: 2019-11-05

## 2019-11-05 MED ORDER — CHOLECALCIFEROL 25 MCG (1000 UT) PO TABS
2000.00 | ORAL_TABLET | ORAL | Status: DC
Start: 2019-11-05 — End: 2019-11-05

## 2019-11-05 MED ORDER — ALBUTEROL SULFATE (2.5 MG/3ML) 0.083% IN NEBU
2.50 | INHALATION_SOLUTION | RESPIRATORY_TRACT | Status: DC
Start: ? — End: 2019-11-05

## 2019-11-05 MED ORDER — HYDROCODONE-ACETAMINOPHEN 5-325 MG PO TABS
1.00 | ORAL_TABLET | ORAL | Status: DC
Start: ? — End: 2019-11-05

## 2019-11-05 MED ORDER — ATORVASTATIN CALCIUM 40 MG PO TABS
80.00 | ORAL_TABLET | ORAL | Status: DC
Start: 2019-11-05 — End: 2019-11-05

## 2019-11-05 MED ORDER — PANTOPRAZOLE SODIUM 40 MG IV SOLR
40.00 | INTRAVENOUS | Status: DC
Start: 2019-11-04 — End: 2019-11-05

## 2019-11-05 MED ORDER — NITROGLYCERIN 0.4 MG SL SUBL
0.40 | SUBLINGUAL_TABLET | SUBLINGUAL | Status: DC
Start: ? — End: 2019-11-05

## 2019-11-05 MED ORDER — ZOLPIDEM TARTRATE 5 MG PO TABS
5.00 | ORAL_TABLET | ORAL | Status: DC
Start: ? — End: 2019-11-05

## 2019-11-05 MED ORDER — DIAZEPAM 5 MG PO TABS
5.00 | ORAL_TABLET | ORAL | Status: DC
Start: ? — End: 2019-11-05

## 2019-11-05 MED ORDER — SODIUM CHLORIDE 0.9 % IV SOLN
125.00 | INTRAVENOUS | Status: DC
Start: ? — End: 2019-11-05

## 2019-11-05 MED ORDER — MIRABEGRON ER 50 MG PO TB24
50.00 | ORAL_TABLET | ORAL | Status: DC
Start: 2019-11-05 — End: 2019-11-05

## 2019-11-05 MED ORDER — GUAIFENESIN 100 MG/5ML PO SYRP
200.00 | ORAL_SOLUTION | ORAL | Status: DC
Start: ? — End: 2019-11-05

## 2019-11-09 MED ORDER — SODIUM CHLORIDE 0.9 % IV SOLN
10.00 | INTRAVENOUS | Status: DC
Start: ? — End: 2019-11-09

## 2019-11-09 MED ORDER — SODIUM CHLORIDE 0.9 % IV SOLN
10.00 | INTRAVENOUS | Status: DC
Start: 2019-11-08 — End: 2019-11-09

## 2019-11-09 MED ORDER — MUPIROCIN 2 % EX OINT
TOPICAL_OINTMENT | CUTANEOUS | Status: DC
Start: 2019-11-08 — End: 2019-11-09

## 2019-11-09 MED ORDER — GENERIC EXTERNAL MEDICATION
Status: DC
Start: ? — End: 2019-11-09

## 2019-11-09 MED ORDER — PANTOPRAZOLE SODIUM 40 MG IV SOLR
40.00 | INTRAVENOUS | Status: DC
Start: 2019-11-08 — End: 2019-11-09

## 2019-11-11 ENCOUNTER — Telehealth: Payer: Self-pay | Admitting: *Deleted

## 2019-11-11 NOTE — Telephone Encounter (Signed)
Called patient on 11/11/2019 , 1:43 PM in an attempt to reach the patient for a hospital follow up. Spoke with the patient's spouse. She states he is still having black stools, but are starting to change to being more brown.   Admit date: 08/23/12 Discharge: 08/23/12   He does not have any questions or concerns about medications from the hospital admission. The patient's medications were reviewed over the phone, they were counseled to bring in all current medications to the hospital follow up visit.   I advised the patient to call if any questions or concerns arise about the hospital admission or medications    Home health was not started in the hospital.  All questions were answered and a follow up appointment was made. Patient will have a hospital follow up visit on 11/12/2019.  Prior to Admission medications   Medication Sig Start Date End Date Taking? Authorizing Provider  aspirin EC 81 MG tablet Take 81 mg by mouth daily.    [provider]  atorvastatin (LIPITOR) 80 MG tablet Take 1 tablet Daily for Cholesterol 08/20/19   Lucky Cowboy, MD  Cholecalciferol (VITAMIN D) 2000 units tablet Take 8,000 Units by mouth every morning.     [provider]  diazepam (VALIUM) 5 MG tablet Take 1 tablet 3 x /day for Hereditary Tremor 02/04/19   Lucky Cowboy, MD  diclofenac sodium (VOLTAREN) 1 % GEL Apply 4 g topically as needed.    [provider]  isosorbide mononitrate (IMDUR) 30 MG 24 hr tablet TAKE 1 TABLET(30 MG) BY MOUTH DAILY 11/06/18   [provider]  MAGNESIUM PO Take 500 mg by mouth 2 (two) times daily.     [provider]  metoprolol succinate (TOPROL-XL) 25 MG 24 hr tablet Take 1 tablet by mouth daily. 11/21/17   [provider]  Minoxidil (ROGAINE MENS) 5 % FOAM Apply 1 application topically daily.    [provider]  MYRBETRIQ 50 MG TB24 tablet TAKE 1 TABLET(50 MG) BY MOUTH DAILY 10/23/18   Judd Gaudier, NP  nitroGLYCERIN  (NITROSTAT) 0.4 MG SL tablet Place 1 tablet under the tongue every 5 (five) minutes as needed. For chest pain 11/21/17   [provider]  Omega-3 Fatty Acids (FISH OIL) 1200 MG CPDR Take 2 capsules by mouth at bedtime.     [provider]  omeprazole (PRILOSEC) 20 MG capsule TAKE 1 CAPSULE BY MOUTH DAILY 07/10/19   Kerrin Champagne, MD  traMADol (ULTRAM) 50 MG tablet TAKE 1 TABLET BY MOUTH EVERY 6 HOURS AS NEEDED 11/01/18   Kerrin Champagne, MD  triamcinolone ointment (KENALOG) 0.1 % Apply 1 application topically 3 (three) times daily. Patient taking differently: Apply 1 application topically 2 (two) times daily as needed.  09/02/16   Lucky Cowboy, MD  vitamin B-12 (CYANOCOBALAMIN) 1000 MCG tablet Take 1,000 mcg by mouth daily.    [provider]

## 2019-11-11 NOTE — Progress Notes (Signed)
Hospital follow up  Assessment and Plan: Hospital visit follow up for GI Bleed  Leroy Griffin was seen today for hospitalization follow-up.  Diagnoses and all orders for this visit:  Essential hypertension Continue current medications: Monitor blood pressure at home; call if consistently over 130/80 Continue DASH diet.   Reminder to go to the ER if any CP, SOB, nausea, dizziness, severe HA, changes vision/speech, left arm numbness and tingling and jaw pain. -     CBC with Differential/Platelet -     COMPLETE METABOLIC PANEL WITH GFR  Hyperlipidemia, mixed Continue medications: Discussed dietary and exercise modifications Low fat diet  Vitamin D deficiency Continue supplementation Taking Vitamin D 8,000 IU daily  Acute upper GI bleed Duodenal ulcer with hemorrhage Continue protonix 40mg  Follow up with GI Avoid NSAIDs Continue to monitor  AKI (acute kidney injury) (HCC) Increase water intake Check CMP  Coronary artery disease involving native coronary artery of native heart without angina pectoris Control blood pressure, lipids and glucose Disscused lifestyle modifications, diet & exercise Continue to monitor    All medications were reviewed with patient and family and fully reconciled. All questions answered fully, and patient and family members were encouraged to call the office with any further questions or concerns. Discussed goal to avoid readmission related to this diagnosis.      Over 40 minutes of face to face interview, exam, counseling, chart review, and complex, high/moderate level critical decision making was performed this visit.   Future Appointments  Date Time Provider Department Center  01/22/2020 11:15 AM 01/24/2020, NP [facility] None  08/18/2020  2:00 PM 10/16/2020, MD [facility] None     HPI 74 y.o.male presents for follow up for transition from recent hospitalization.   Admit date to the hospital was 11/04/19, patient was discharged from  the hospital on 11/08/19 and our clinical staff contacted the office the day after discharge to set up a follow up appointment. The discharge summary, medications, and diagnostic test results were reviewed before meeting with the patient. The patient was admitted for:   [name] a 74 y.o.malewith PMH of GI bleed, h/o gastric ulcer in 2016 thought secondary to high dose ASA, CAD s/p DES in LAD and POBA to diagonal, SSS/AV block s/p dual chamber MDT PPM placement in 09/2019, paroxysmal a fib (not on OAC), and HTN who was originally admitted to [facility] with c/o large bloody bowel movement on 11/02/2019. BP was 89/51 in ED. Stablized with IVF. Initial work up showed positive rectal blood, H/H 10.1/30.3 then 8.6/25.9 this morning and dropped to 7.9/24 around noon today. Received 2U PRBC. Pt has been on Prilosec but out of meds about a week ago. Pt underwent EGD by Dr.Cengia earlier today and was found to have a high risk deep cratered ulcer, afterwhich, pt was transferred to [facility] for need of more advanced therapy may required IR intervention. Pt arrived in no acute distress, hemodynamically stable, repeat H/H stable at 9.6/29.3. Pt feels cold, better with warming blanket, denies fever, N/V/abdominal pain, chest pain/presure. Has not had a bowel movement since Saturday.  Hospital Course:   Acute upper GI bleed with h/o gastric ulcer EGD At OSH on 11/04/2019 showed high risk duodenal sweep ulcer with overlying fresh clot and active oozing after passage of the endoscope, hemostasis achieved with epinephrine injection.  Received 3 units total and at his charge H/H  8.0/24.3  Received at [facility].  LBM: one day ago.  Type one stool, black in color.  Water intake:  5 -  8oz glasses a day  Home health is not involved.    Admit date: 11/04/2019 Discharge date: 11/08/2019  Hospital Days: 4 days  Active Hospital Problems  Diagnosis Date Noted POA  . *Acute upper GI bleed 11/04/2019 Yes  .  Duodenal ulcer with hemorrhage 11/04/2019 Yes  . sss and intermittent 2:1 AVB s/p MDT PPM 2/021 09/19/2019 Yes  . AKI (acute kidney injury) (*) 08/02/2018 Unknown  . S/P drug eluting coronary stent placement to LAD and POBA to diagonal 11/20/17 11/21/2017 Not Applicable  . CAD in native artery 11/20/2017 Yes  . History of GI bleed 01/02/2015 Not Applicable  . Essential hypertension 01/02/2015 Yes  . Dyslipidemia (high LDL; low HDL) 01/02/2015 Yes    Follow up appointment GI on May 20.  They were given hospital precautions as well.  Reports since discharge he has been feeling well.  He has been battling with constipation.  Reports he has had a bowel movement but it was difficult to pass and type 1 on bristol stool chart and was dark in color.  Reports he is not to start iron tablets until regular stool, brown. He denies any nausea, vomiting, coughing after eating, stomach pains, cramping or bloating.  Denies any cheat pains or shortness of breath.  Images while in the hospital: DG FLUORO GUIDED LOC OF NEEDLE/CATH TIP FOR SPINAL INJECT LT  Result Date: 11/15/2016 CLINICAL DATA:  74 year old male presents for large volume lumbar puncture in conjunction with physical therapy testing. EXAM: DIAGNOSTIC LUMBAR PUNCTURE UNDER FLUOROSCOPIC GUIDANCE FLUOROSCOPY TIME:  Fluoroscopy Time:  0 minutes 12 seconds Radiation Exposure Index (if provided by the fluoroscopic device): Number of Acquired Spot Images: 0 PROCEDURE: Informed consent was obtained from the patient prior to the procedure, including potential complications of headache, allergy, and pain. A "time-out" was performed. With the patient prone, the lower back was prepped with Betadine. 1% Lidocaine was used for local anesthesia. Lumbar puncture was performed at the L1-L2 level using a 3.5 inch x 20 gauge needle with return of clear, colorless CSF with an opening pressure of 15 cm water. 29.5 ml of CSF were obtained for laboratory studies. The needle  was withdrawn, direct pressure was held, and hemostasis noted. The patient tolerated the procedure well and there were no apparent complications. Appropriate post procedural orders were placed on the chart. The patient was returned to short stay for repeat physical therapy testing, observation, and ultimately discharge to home if he remains stable. IMPRESSION: Large volume lumbar puncture under fluoroscopy. Opening pressure 15 cm of water. 29.5 mL of CSF were collected. Electronically Signed   By: Odessa Fleming M.D.   On: 11/15/2016 15:02      Current Outpatient Medications (Cardiovascular):  .  atorvastatin (LIPITOR) 80 MG tablet, Take 1 tablet Daily for Cholesterol .  isosorbide mononitrate (IMDUR) 30 MG 24 hr tablet, TAKE 1 TABLET(30 MG) BY MOUTH DAILY .  losartan (COZAAR) 50 MG tablet, Take 50 mg by mouth. .  metoprolol succinate (TOPROL-XL) 25 MG 24 hr tablet, Take 1 tablet by mouth daily. .  nitroGLYCERIN (NITROSTAT) 0.4 MG SL tablet, Place 1 tablet under the tongue every 5 (five) minutes as needed. For chest pain   Current Outpatient Medications (Analgesics):  .  aspirin EC 81 MG tablet, Take 81 mg by mouth daily. .  traMADol (ULTRAM) 50 MG tablet, TAKE 1 TABLET BY MOUTH EVERY 6 HOURS AS NEEDED  Current Outpatient Medications (Hematological):  Marland Kitchen  FEROSUL 325 (  65 Fe) MG tablet, Take 325 mg by mouth daily. .  vitamin B-12 (CYANOCOBALAMIN) 1000 MCG tablet, Take 1,000 mcg by mouth daily.  Current Outpatient Medications (Other):  Marland Kitchen  Cholecalciferol (VITAMIN D) 2000 units tablet, Take 8,000 Units by mouth every morning.  .  diazepam (VALIUM) 5 MG tablet, Take 1 tablet 3 x /day for Hereditary Tremor .  diclofenac sodium (VOLTAREN) 1 % GEL, Apply 4 g topically as needed. Marland Kitchen  MAGNESIUM PO, Take 500 mg by mouth 2 (two) times daily.  .  Minoxidil (ROGAINE MENS) 5 % FOAM, Apply 1 application topically daily. Marland Kitchen  MYRBETRIQ 50 MG TB24 tablet, TAKE 1 TABLET(50 MG) BY MOUTH DAILY .  Omega-3 Fatty Acids  (FISH OIL) 1200 MG CPDR, Take 2 capsules by mouth at bedtime.  .  pantoprazole (PROTONIX) 40 MG tablet, Take 40 mg by mouth 2 (two) times daily. Marland Kitchen  triamcinolone ointment (KENALOG) 0.1 %, Apply 1 application topically 3 (three) times daily. (Patient taking differently: Apply 1 application topically 2 (two) times daily as needed. )  Past Medical History:  Diagnosis Date  . CAD (coronary artery disease)   . Essential tremor   . Gastric ulcer 01/14/2015  . Gout   . Hyperlipidemia   . Hypertension   . Parkinson's disease ([place])   . Prediabetes   . Proteinuria   . Vitamin D deficiency      Allergies  Allergen Reactions  . Enalapril Rash  . Aspirin     Internal bleeding  . Nsaids     Internal bleeding  . Welchol [Colesevelam Hcl] Diarrhea  . Vasotec [Enalaprilat] Rash    ROS: all negative except above.   Physical Exam: Filed Weights   11/12/19 1121  Weight: 199 lb (90.3 kg)   BP 128/66   Pulse 91   Temp (!) 97.3 F (36.3 C)   Wt 199 lb (90.3 kg)   SpO2 99%   BMI 26.99 kg/m  General Appearance: Well nourished, in no apparent distress. Eyes: PERRLA, EOMs, conjunctiva no swelling or erythema Sinuses: No Frontal/maxillary tenderness ENT/Mouth: Ext aud canals clear, TMs without erythema, bulging. No erythema, swelling, or exudate on post pharynx.  Tonsils not swollen or erythematous. Hearing normal.  Neck: Supple, thyroid normal.  Respiratory: Respiratory effort normal, BS equal bilaterally without rales, rhonchi, wheezing or stridor.  Cardio: RRR with no MRGs. Brisk peripheral pulses without edema.  Abdomen: Soft, + BS.  Non tender, no guarding, rebound, hernias, masses. Lymphatics: Non tender without lymphadenopathy.  Musculoskeletal: Full ROM, 5/5 strength, ambulates with walker. Skin: Warm, dry without rashes, lesions, ecchymosis.  Neuro: Cranial nerves intact. Normal muscle tone, no cerebellar symptoms. Sensation intact.  Psych: Awake and oriented X 3, normal affect,  Insight and Judgment appropriate.     Garnet Sierras, NP 12:51 PM [facility]

## 2019-11-12 ENCOUNTER — Ambulatory Visit (INDEPENDENT_AMBULATORY_CARE_PROVIDER_SITE_OTHER): Payer: Medicare Other | Admitting: Adult Health Nurse Practitioner

## 2019-11-12 ENCOUNTER — Other Ambulatory Visit: Payer: Self-pay

## 2019-11-12 ENCOUNTER — Encounter: Payer: Self-pay | Admitting: Adult Health Nurse Practitioner

## 2019-11-12 VITALS — BP 128/66 | HR 91 | Temp 97.3°F | Wt 199.0 lb

## 2019-11-12 DIAGNOSIS — N179 Acute kidney failure, unspecified: Secondary | ICD-10-CM

## 2019-11-12 DIAGNOSIS — I10 Essential (primary) hypertension: Secondary | ICD-10-CM

## 2019-11-12 DIAGNOSIS — E782 Mixed hyperlipidemia: Secondary | ICD-10-CM | POA: Diagnosis not present

## 2019-11-12 DIAGNOSIS — K922 Gastrointestinal hemorrhage, unspecified: Secondary | ICD-10-CM

## 2019-11-12 DIAGNOSIS — K264 Chronic or unspecified duodenal ulcer with hemorrhage: Secondary | ICD-10-CM

## 2019-11-12 DIAGNOSIS — E559 Vitamin D deficiency, unspecified: Secondary | ICD-10-CM | POA: Diagnosis not present

## 2019-11-12 DIAGNOSIS — I2510 Atherosclerotic heart disease of native coronary artery without angina pectoris: Secondary | ICD-10-CM | POA: Diagnosis not present

## 2019-11-12 LAB — COMPLETE METABOLIC PANEL WITH GFR
AG Ratio: 1.6 (calc) (ref 1.0–2.5)
ALT: 18 U/L (ref 9–46)
AST: 18 U/L (ref 10–35)
Albumin: 3.4 g/dL — ABNORMAL LOW (ref 3.6–5.1)
Alkaline phosphatase (APISO): 69 U/L (ref 35–144)
BUN/Creatinine Ratio: 20 (calc) (ref 6–22)
BUN: 24 mg/dL (ref 7–25)
CO2: 31 mmol/L (ref 20–32)
Calcium: 9.5 mg/dL (ref 8.6–10.3)
Chloride: 106 mmol/L (ref 98–110)
Creat: 1.21 mg/dL — ABNORMAL HIGH (ref 0.70–1.18)
GFR, Est African American: 67 mL/min/{1.73_m2} (ref >=60)
GFR, Est Non African American: 58 mL/min/{1.73_m2} — ABNORMAL LOW (ref >=60)
Globulin: 2.1 g/dL (calc) (ref 1.9–3.7)
Glucose, Bld: 121 mg/dL — ABNORMAL HIGH (ref 65–99)
Potassium: 4.9 mmol/L (ref 3.5–5.3)
Sodium: 142 mmol/L (ref 135–146)
Total Bilirubin: 0.5 mg/dL (ref 0.2–1.2)
Total Protein: 5.5 g/dL — ABNORMAL LOW (ref 6.1–8.1)

## 2019-11-12 LAB — CBC WITH DIFFERENTIAL/PLATELET
Absolute Monocytes: 342 cells/uL (ref 200–950)
Basophils Absolute: 40 cells/uL (ref 0–200)
Basophils Relative: 1.1 %
Eosinophils Absolute: 169 cells/uL (ref 15–500)
Eosinophils Relative: 4.7 %
HCT: 28 % — ABNORMAL LOW (ref 38.5–50.0)
Hemoglobin: 9.2 g/dL — ABNORMAL LOW (ref 13.2–17.1)
Lymphs Abs: 1408 cells/uL (ref 850–3900)
MCH: 32.3 pg (ref 27.0–33.0)
MCHC: 32.9 g/dL (ref 32.0–36.0)
MCV: 98.2 fL (ref 80.0–100.0)
MPV: 10.1 fL (ref 7.5–12.5)
Monocytes Relative: 9.5 %
Neutro Abs: 1642 cells/uL (ref 1500–7800)
Neutrophils Relative %: 45.6 %
Platelets: 216 10*3/uL (ref 140–400)
RBC: 2.85 10*6/uL — ABNORMAL LOW (ref 4.20–5.80)
RDW: 14.1 % (ref 11.0–15.0)
Total Lymphocyte: 39.1 %
WBC: 3.6 10*3/uL — ABNORMAL LOW (ref 3.8–10.8)

## 2019-11-12 NOTE — Patient Instructions (Signed)
We will contact you in 1-3 days with comments on lab results.  Try Miralax, one cap full 17grams daily in the morning.  If this is makes you stool too soft then try every other day.  Since you are out of the myrbetriq.  You can trial stopping this until the resolve of the constipation.  Monitor how may times a night you get up to use the bathroom and see if this changes.  I will give you samples to restart to see if this gives you benefit.  Please let us know if you would like a prescription via MyChart.

## 2019-11-15 DIAGNOSIS — I82412 Acute embolism and thrombosis of left femoral vein: Secondary | ICD-10-CM | POA: Diagnosis present

## 2019-11-15 DIAGNOSIS — E663 Overweight: Secondary | ICD-10-CM | POA: Diagnosis present

## 2019-11-15 DIAGNOSIS — Z951 Presence of aortocoronary bypass graft: Secondary | ICD-10-CM | POA: Diagnosis not present

## 2019-11-15 DIAGNOSIS — E861 Hypovolemia: Secondary | ICD-10-CM | POA: Diagnosis present

## 2019-11-15 DIAGNOSIS — M5136 Other intervertebral disc degeneration, lumbar region: Secondary | ICD-10-CM | POA: Diagnosis not present

## 2019-11-15 DIAGNOSIS — K264 Chronic or unspecified duodenal ulcer with hemorrhage: Secondary | ICD-10-CM | POA: Diagnosis present

## 2019-11-15 DIAGNOSIS — I824Y2 Acute embolism and thrombosis of unspecified deep veins of left proximal lower extremity: Secondary | ICD-10-CM | POA: Diagnosis not present

## 2019-11-15 DIAGNOSIS — E8770 Fluid overload, unspecified: Secondary | ICD-10-CM | POA: Diagnosis not present

## 2019-11-15 DIAGNOSIS — I82409 Acute embolism and thrombosis of unspecified deep veins of unspecified lower extremity: Secondary | ICD-10-CM | POA: Diagnosis not present

## 2019-11-15 DIAGNOSIS — R0600 Dyspnea, unspecified: Secondary | ICD-10-CM | POA: Diagnosis not present

## 2019-11-15 DIAGNOSIS — F419 Anxiety disorder, unspecified: Secondary | ICD-10-CM | POA: Diagnosis present

## 2019-11-15 DIAGNOSIS — D62 Acute posthemorrhagic anemia: Secondary | ICD-10-CM | POA: Diagnosis present

## 2019-11-15 DIAGNOSIS — Z8719 Personal history of other diseases of the digestive system: Secondary | ICD-10-CM | POA: Diagnosis not present

## 2019-11-15 DIAGNOSIS — I2510 Atherosclerotic heart disease of native coronary artery without angina pectoris: Secondary | ICD-10-CM | POA: Diagnosis present

## 2019-11-15 DIAGNOSIS — I82431 Acute embolism and thrombosis of right popliteal vein: Secondary | ICD-10-CM | POA: Diagnosis present

## 2019-11-15 DIAGNOSIS — Z955 Presence of coronary angioplasty implant and graft: Secondary | ICD-10-CM | POA: Diagnosis not present

## 2019-11-15 DIAGNOSIS — I480 Paroxysmal atrial fibrillation: Secondary | ICD-10-CM | POA: Diagnosis present

## 2019-11-15 DIAGNOSIS — I10 Essential (primary) hypertension: Secondary | ICD-10-CM | POA: Diagnosis not present

## 2019-11-15 DIAGNOSIS — R079 Chest pain, unspecified: Secondary | ICD-10-CM | POA: Diagnosis not present

## 2019-11-15 DIAGNOSIS — K219 Gastro-esophageal reflux disease without esophagitis: Secondary | ICD-10-CM | POA: Diagnosis present

## 2019-11-15 DIAGNOSIS — Z888 Allergy status to other drugs, medicaments and biological substances status: Secondary | ICD-10-CM | POA: Diagnosis not present

## 2019-11-15 DIAGNOSIS — K269 Duodenal ulcer, unspecified as acute or chronic, without hemorrhage or perforation: Secondary | ICD-10-CM | POA: Diagnosis not present

## 2019-11-15 DIAGNOSIS — R7989 Other specified abnormal findings of blood chemistry: Secondary | ICD-10-CM | POA: Diagnosis not present

## 2019-11-15 DIAGNOSIS — D61818 Other pancytopenia: Secondary | ICD-10-CM | POA: Diagnosis not present

## 2019-11-15 DIAGNOSIS — Z79899 Other long term (current) drug therapy: Secondary | ICD-10-CM | POA: Diagnosis not present

## 2019-11-15 DIAGNOSIS — Z6827 Body mass index (BMI) 27.0-27.9, adult: Secondary | ICD-10-CM | POA: Diagnosis not present

## 2019-11-15 DIAGNOSIS — Z950 Presence of cardiac pacemaker: Secondary | ICD-10-CM | POA: Diagnosis not present

## 2019-11-15 DIAGNOSIS — I959 Hypotension, unspecified: Secondary | ICD-10-CM | POA: Diagnosis not present

## 2019-11-15 DIAGNOSIS — E785 Hyperlipidemia, unspecified: Secondary | ICD-10-CM | POA: Diagnosis present

## 2019-11-15 DIAGNOSIS — K922 Gastrointestinal hemorrhage, unspecified: Secondary | ICD-10-CM | POA: Diagnosis not present

## 2019-11-15 DIAGNOSIS — I9589 Other hypotension: Secondary | ICD-10-CM | POA: Diagnosis not present

## 2019-11-15 DIAGNOSIS — Z049 Encounter for examination and observation for unspecified reason: Secondary | ICD-10-CM | POA: Diagnosis not present

## 2019-11-15 DIAGNOSIS — Z7982 Long term (current) use of aspirin: Secondary | ICD-10-CM | POA: Diagnosis not present

## 2019-11-15 DIAGNOSIS — I495 Sick sinus syndrome: Secondary | ICD-10-CM | POA: Diagnosis present

## 2019-11-21 MED ORDER — GENERIC EXTERNAL MEDICATION
Status: DC
Start: ? — End: 2019-11-21

## 2019-11-21 MED ORDER — SODIUM CHLORIDE 0.9 % IV SOLN
10.00 | INTRAVENOUS | Status: DC
Start: ? — End: 2019-11-21

## 2019-11-21 MED ORDER — ATORVASTATIN CALCIUM 80 MG PO TABS
80.00 | ORAL_TABLET | ORAL | Status: DC
Start: 2019-11-20 — End: 2019-11-21

## 2019-11-21 MED ORDER — NITROGLYCERIN 0.4 MG SL SUBL
0.40 | SUBLINGUAL_TABLET | SUBLINGUAL | Status: DC
Start: ? — End: 2019-11-21

## 2019-11-21 MED ORDER — DSS 100 MG PO CAPS
100.00 | ORAL_CAPSULE | ORAL | Status: DC
Start: 2019-11-20 — End: 2019-11-21

## 2019-11-21 MED ORDER — THERA PO TABS
1.00 | ORAL_TABLET | ORAL | Status: DC
Start: 2019-11-21 — End: 2019-11-21

## 2019-11-21 MED ORDER — PANTOPRAZOLE SODIUM 40 MG IV SOLR
40.00 | INTRAVENOUS | Status: DC
Start: 2019-11-20 — End: 2019-11-21

## 2019-11-21 MED ORDER — LOSARTAN POTASSIUM 50 MG PO TABS
50.00 | ORAL_TABLET | ORAL | Status: DC
Start: 2019-11-21 — End: 2019-11-21

## 2019-11-21 MED ORDER — DIAZEPAM 5 MG PO TABS
5.00 | ORAL_TABLET | ORAL | Status: DC
Start: 2019-11-20 — End: 2019-11-21

## 2019-12-01 ENCOUNTER — Other Ambulatory Visit: Payer: Self-pay | Admitting: Internal Medicine

## 2019-12-01 DIAGNOSIS — G250 Essential tremor: Secondary | ICD-10-CM

## 2019-12-01 MED ORDER — DIAZEPAM 5 MG PO TABS: ORAL_TABLET | ORAL | 1 refills | Status: DC

## 2019-12-15 ENCOUNTER — Other Ambulatory Visit: Payer: Self-pay | Admitting: Adult Health

## 2019-12-26 DIAGNOSIS — I10 Essential (primary) hypertension: Secondary | ICD-10-CM | POA: Diagnosis not present

## 2019-12-26 DIAGNOSIS — D500 Iron deficiency anemia secondary to blood loss (chronic): Secondary | ICD-10-CM | POA: Diagnosis not present

## 2019-12-26 DIAGNOSIS — Z95828 Presence of other vascular implants and grafts: Secondary | ICD-10-CM | POA: Diagnosis not present

## 2020-01-22 ENCOUNTER — Ambulatory Visit (INDEPENDENT_AMBULATORY_CARE_PROVIDER_SITE_OTHER): Payer: Medicare Other | Admitting: Adult Health Nurse Practitioner

## 2020-01-22 ENCOUNTER — Encounter: Payer: Self-pay | Admitting: Adult Health Nurse Practitioner

## 2020-01-22 ENCOUNTER — Ambulatory Visit: Payer: Medicare Other | Admitting: Adult Health

## 2020-01-22 ENCOUNTER — Other Ambulatory Visit: Payer: Self-pay

## 2020-01-22 VITALS — BP 108/72 | HR 73 | Temp 97.5°F | Wt 198.2 lb

## 2020-01-22 DIAGNOSIS — M1000 Idiopathic gout, unspecified site: Secondary | ICD-10-CM

## 2020-01-22 DIAGNOSIS — G250 Essential tremor: Secondary | ICD-10-CM

## 2020-01-22 DIAGNOSIS — E559 Vitamin D deficiency, unspecified: Secondary | ICD-10-CM | POA: Diagnosis not present

## 2020-01-22 DIAGNOSIS — I82A13 Acute embolism and thrombosis of axillary vein, bilateral: Secondary | ICD-10-CM

## 2020-01-22 DIAGNOSIS — Z95828 Presence of other vascular implants and grafts: Secondary | ICD-10-CM

## 2020-01-22 DIAGNOSIS — R6889 Other general symptoms and signs: Secondary | ICD-10-CM | POA: Diagnosis not present

## 2020-01-22 DIAGNOSIS — Z955 Presence of coronary angioplasty implant and graft: Secondary | ICD-10-CM

## 2020-01-22 DIAGNOSIS — E663 Overweight: Secondary | ICD-10-CM

## 2020-01-22 DIAGNOSIS — E782 Mixed hyperlipidemia: Secondary | ICD-10-CM

## 2020-01-22 DIAGNOSIS — Z0000 Encounter for general adult medical examination without abnormal findings: Secondary | ICD-10-CM

## 2020-01-22 DIAGNOSIS — F0631 Mood disorder due to known physiological condition with depressive features: Secondary | ICD-10-CM

## 2020-01-22 DIAGNOSIS — I10 Essential (primary) hypertension: Secondary | ICD-10-CM

## 2020-01-22 DIAGNOSIS — Z0001 Encounter for general adult medical examination with abnormal findings: Secondary | ICD-10-CM

## 2020-01-22 DIAGNOSIS — N3281 Overactive bladder: Secondary | ICD-10-CM

## 2020-01-22 DIAGNOSIS — I2510 Atherosclerotic heart disease of native coronary artery without angina pectoris: Secondary | ICD-10-CM

## 2020-01-22 DIAGNOSIS — K264 Chronic or unspecified duodenal ulcer with hemorrhage: Secondary | ICD-10-CM

## 2020-01-22 DIAGNOSIS — R7309 Other abnormal glucose: Secondary | ICD-10-CM

## 2020-01-22 DIAGNOSIS — R809 Proteinuria, unspecified: Secondary | ICD-10-CM

## 2020-01-22 NOTE — Progress Notes (Signed)
MEDICARE ANNUAL WELLNESS VISIT AND FOLLOW UP Assessment:   Diagnoses and all orders for this visit:  Encounter for Medicare annual wellness exam Yearly  Sinus pause   With blocked PACs, PVCs and sinus arrhythmia Rate and rhythm regular today, continue BB, followed by cardiology  Essential hypertension Continue medications Monitor blood pressure at home; call if consistently over 130/80 Continue DASH diet.   Reminder to go to the ER if any CP, SOB, nausea, dizziness, severe HA, changes vision/speech, left arm numbness and tingling and jaw pain.  Essential tremor/abnormal gait and mobility Poorly explained episodic; walking with walker at this time; Parkinson's ruled out; followed by Dr. Carles Collet Recently underwent "LSVT" therapy in past with benefit.  Vitamin D deficiency At goal at recent check; continue to recommend supplementation for goal of 70-100 Defer vitamin D level  Other abnormal glucose Recent A1Cs at goal Discussed diet/exercise, weight management  Defer A1C; check CMP  Overweight (BMI 25.0-29.9) Long discussion about weight loss, diet, and exercise Recommended diet heavy in fruits and veggies and low in animal meats, cheeses, and dairy products, appropriate calorie intake Discussed appropriate weight for height  Continue heart healthy diet, daily exercise Follow up at next visit  Hyperlipidemia Continue medications Continue low cholesterol diet and exercise.  Check lipid panel.   Idiopathic gout, unspecified chronicity, unspecified site Continue allopurinol Diet discussed Check uric acid as needed  Proteinuria, unspecified type Followed by Dr. Moshe Cipro  Overactive bladder Severe, limiting quality of life Manged by Dr. Jeffie Pollock, some spotty benefit with ER oxybutynin Has close follow up scheduled, discussing botox   Coronary artery disease involving native coronary artery of native heart, angina presence unspecified Control blood pressure, cholesterol,  glucose, increase exercise.  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 Jeffie Pollock sooner if needed, therapy via Dr. Hall Busing Wife agrees to monitor closely, call/follow up or present to ED if acutely worse Leroy Griffin was seen today for follow-up and medicare wellness.  S/P IVC filter Has follow up scheduled  DVT of axillary vein, acute bilateral ([place]) IVC filter in place.  Gastrointestinal hemorrhage associated with duodenal ulcer Avoid NSAID's Continue to monitor   Continue diet and meds as discussed. Further disposition pending results of labs. Over 40 minutes of face to face interview, exam, counseling, chart review, and critical decision making was performed.  Future Appointments  Date Time Provider [place]  04/23/2020  2:30 PM Unk Pinto, MD [facility] None  08/18/2020  2:00 PM Unk Pinto, MD [facility] None     Plan:   During the course of the visit the patient was educated and counseled about appropriate screening and preventive services including:    Pneumococcal vaccine   Hepatitis B vaccine  Td vaccine  Screening electrocardiogram  Colorectal cancer screening  Diabetes screening  Glaucoma screening  Nutrition counseling    Subjective:  Leroy Griffin is a 74 y.o. male who presents accompanied for Medicare Annual Wellness Visit and 6 month follow up for HTN, HLD, prediabetes, and vitamin D Def.   He was hospitalized two months ago, 11/15/19 -11/20/19 for acute GI bleed with hypotension related to volume from acute blood loss.  He was having chest pain  that  he reports was resolved by taking nitroglycerine and had second episode prior to arrival at ED.  He was also having bright red blood per rectum with melena, lightheadedness and dizziness.  He had an EGD which showed deep cratered ulcer requiring IR intervention.  He received 5 units blood and at discharge hgb 8.2.  He was started on BID PPI, GI consult, if re-occurrence then IR surgical intervened would be needed.  IVC filter placed for nonocclusive DVT in right popliteal and left proximal mid distal superficial femoral vein.  No edema to bilateral extremities.  Losartan was held related to hypotension during hospital admission.  bASA discontinued related to increase risk of bleeding.   He had sepsis consequent of obstructed bile duct and cholecystitis in 07/2018 and reports recovery has been slow, has completed home therapy, also reports he underwent LSVT BIG treatment that he finished up a few weeks ago, that was beneficial. Patient has also been evaluated by Dr Tat for Tremor - presumed Hereditary or Essential and she ruled out Parkinson's. Because of gait difficulty, he also had high volume LP w/o any improvement in his gait. He is also now followed by Dr. Otelia Sergeant for back pain and bilateral knee pain. He has hx of ankylosing spondylitis with bone spurs remotely, discussed surgery but ultimately declined. Has valium PRN.    Patient had negative screening stress test and Cardiac MRI in Feb 2017 per Dr Graciela Husbands, however demonstrated unstable angina recently in 11/2017 and underwent diagnostic heart cath by Dr. Satira Sark. Cair which demonstrated high-grade stenosis of distal LAD (85-90%) and diagonal (90%) - stent was placed to LAD with balloon inflation of diagonal with improvement to 50%. He is on brilinta and bASA. He has follow up with Dr. Neale Burly and Dr. Graciela Husbands.   Has urinary frequency, nocturia x 5-8, very rare incontinence if he waits too long. Occ hesitancy, dribbling, weak stream. - has seen Dr. Annabell Howells in recent  weeks, reports normal prostate, has small bladder and OAB, has been restarted on ER oxybutynin with "spotty" benefit per patient. He did try myrbetriq without benefit. Has upcoming follow up appointment later this month, discussing possible botox, cath.   BMI is Body mass index is 26.88 kg/m., he has been working on diet and exercise. Wife is strictly watching his diet for heart healthy diet and portion control.  Wt Readings from Last 3 Encounters:  01/22/20 198 lb 3.2 oz (89.9 kg)  11/12/19 199 lb (90.3 kg)  10/22/19 200 lb 12.8 oz (91.1 kg)   His blood pressure has been controlled at home, today their BP is BP: 108/72 He denies chest pain, shortness of breath, dizziness.  He is on cholesterol medication (atorvastatin 40 mg daily) and denies myalgias. His cholesterol is at goal. The cholesterol last visit was:   Lab Results  Component Value Date   CHOL 134 10/22/2019   HDL 34 (L) 10/22/2019   LDLCALC 63 10/22/2019   TRIG 280 (H) 10/22/2019   CHOLHDL 3.9 10/22/2019   He has been working on diet and exercise for glucose management, and denies paresthesia of the feet, polydipsia and polyuria. Last A1C in the office was:  Lab Results  Component Value Date   HGBA1C 5.5 07/23/2019   Patient also has Idiopathic proteinuria & CKD2 followed by Dr Julien Girt.  Lab Results  Component Value Date   GFRNONAA 58 (L) 11/12/2019   Patient is on Vitamin D supplement.   Lab Results  Component Value Date   VD25OH  53 10/22/2019       Medication Review: Current Outpatient Medications on File Prior to Visit  Medication Sig Dispense Refill  . aspirin EC 81 MG tablet Take 81 mg by mouth daily.    Marland Kitchen atorvastatin (LIPITOR) 80 MG tablet Take 1 tablet Daily for Cholesterol 90 tablet 3  . Cholecalciferol (VITAMIN D) 2000 units tablet Take 8,000 Units by mouth every morning.     . diazepam (VALIUM) 5 MG tablet Take 1 tablet 3 x /day for Hereditary Tremor 270 tablet 1  . isosorbide mononitrate  (IMDUR) 30 MG 24 hr tablet TAKE 1 TABLET(30 MG) BY MOUTH DAILY    . losartan (COZAAR) 50 MG tablet Take 50 mg by mouth.    Marland Kitchen MAGNESIUM PO Take 500 mg by mouth 2 (two) times daily.     . metoprolol succinate (TOPROL-XL) 25 MG 24 hr tablet Take 1 tablet by mouth daily.    . Minoxidil (ROGAINE MENS) 5 % FOAM Apply 1 application topically daily.    . nitroGLYCERIN (NITROSTAT) 0.4 MG SL tablet Place 1 tablet under the tongue every 5 (five) minutes as needed. For chest pain    . Omega-3 Fatty Acids (FISH OIL) 1200 MG CPDR Take 2 capsules by mouth at bedtime.     Marland Kitchen oxybutynin (DITROPAN-XL) 10 MG 24 hr tablet Take 10 mg by mouth at bedtime.    . pantoprazole (PROTONIX) 40 MG tablet Take 40 mg by mouth 2 (two) times daily.    . traMADol (ULTRAM) 50 MG tablet TAKE 1 TABLET BY MOUTH EVERY 6 HOURS AS NEEDED 30 tablet 0  . triamcinolone ointment (KENALOG) 0.1 % Apply 1 application topically 3 (three) times daily. (Patient taking differently: Apply 1 application topically 2 (two) times daily as needed. ) 80 g 3  . vitamin B-12 (CYANOCOBALAMIN) 1000 MCG tablet Take 1,000 mcg by mouth daily.    . diclofenac sodium (VOLTAREN) 1 % GEL Apply 4 g topically as needed. (Patient not taking: Reported on 01/22/2020)    . FEROSUL 325 (65 Fe) MG tablet Take 325 mg by mouth daily. (Patient not taking: Reported on 01/22/2020)    . mirabegron ER (MYRBETRIQ) 50 MG TB24 tablet Take 1 tablet Daily for Bladder Control (Patient not taking: Reported on 01/22/2020) 90 tablet 0   No current facility-administered medications on file prior to visit.    Current Problems (verified) Patient Active Problem List   Diagnosis Date Noted  . Other abnormalities of gait and mobility 01/17/2019  . Depression due to physical illness 01/17/2019  . Overactive bladder 01/11/2018  . S/P drug eluting coronary stent placement 11/21/2017  . CAD in native artery 11/20/2017  . Anxiety 11/14/2017  . Overweight (BMI 25.0-29.9) 08/26/2014  . Sinus pause    With blocked PACs, PVCs and sinus arrhythmia 10/10/2013  . Medication management 10/06/2013  . Hyperlipidemia 08/27/2013  . Essential tremor   . Proteinuria   . Other abnormal glucose   . Gout   . Vitamin D deficiency   . Essential hypertension 10/10/2010    Screening Tests Immunization History  Administered Date(s) Administered  . Influenza Split 05/23/2013  . Influenza, High Dose Seasonal PF 05/14/2014, 06/11/2015, 05/19/2016, 06/10/2017, 05/20/2019  . PFIZER SARS-COV-2 Vaccination 10/11/2019, 11/01/2019  . Pneumococcal Conjugate-13 12/03/2014  . Pneumococcal Polysaccharide-23 06/01/2010  . Td 06/01/2010  . Tdap 01/28/2014  . Zoster 08/08/2004    Preventative care: Last colonoscopy: 2013 due 10 years EGD: 2019 Echo 08/2015 Stress test 09/2015 Cardiac cath 11/2017  Prior  vaccinations: TD or Tdap: 2015 Influenza: 2019 at cardiology Pneumococcal: 2011 Prevnar 2016 Shingles/Zostavax: 2006  SARS-COV2-Pfizer Complete: 11/01/19  Names of Other Physician/Practitioners you currently use: 1. [facility] here for primary care 2. [facility]Freeman[facility] 2019 3. Dr. Pollyann KennedyaylorDr/ [place] , dentist, last visit 2020, q 6 months  Patient Care Team: Lucky CowboyMcKeown, William, MD as PCP - General (Internal Medicine) Sharyn LullHaverstock, Elvin Sohristina L, MD as Referring Physician (Dermatology) Mittie BodoKlein, Jeffrey L, MD as Referring Physician (Cardiology) Annie SableGoldsborough, Kellie, MD as Consulting Physician (Nephrology) Marney SettingPeters, Randy, MD as Referring Physician (Gastroenterology)   Allergies Allergies  Allergen Reactions  . Enalapril Rash  . Aspirin     Internal bleeding  . Nsaids     Internal bleeding  . Welchol [Colesevelam Hcl] Diarrhea  . Vasotec [Enalaprilat] Rash    SURGICAL HISTORY He  has a past surgical history that includes Knee surgery (Right); Tonsillectomy; Cyst excision; and Cholecystectomy. FAMILY HISTORY His family history  includes Dementia in his mother; Emphysema in his brother; Heart disease in his brother; Stroke in his father. SOCIAL HISTORY He  reports that he has never smoked. He has never used smokeless tobacco. He reports previous alcohol use of about 14.0 standard drinks of alcohol per week. He reports that he does not use drugs.  MEDICARE WELLNESS OBJECTIVES: Physical activity: Current Exercise Habits: Home exercise routine, Type of exercise: Other - see comments (Yard work), Time (Minutes): 30, Frequency (Times/Week): 3, Weekly Exercise (Minutes/Week): 90, Intensity: Mild, Exercise limited by: orthopedic condition(s) Cardiac risk factors: Cardiac Risk Factors include: advanced age (>7355men, 104>65 women);dyslipidemia;hypertension;male gender Depression/mood screen:   [facility] 01/22/2020  Decreased Interest 1  Down, Depressed, Hopeless 2  PHQ - 2 Score 3  Altered sleeping 0  Tired, decreased energy -  Change in appetite 0  Feeling bad or failure about yourself  1  Trouble concentrating 1  Moving slowly or fidgety/restless 0  Suicidal thoughts 1  PHQ-9 Score 6  Difficult doing work/chores -    ADLs:  In your present state of health, do you have any difficulty performing the following activities: 01/21/2020 07/22/2019  Hearing? Y N  Vision? N N  Difficulty concentrating or making decisions? N N  Walking or climbing stairs? Y N  Dressing or bathing? N N  Doing errands, shopping? Y N  Preparing Food and eating ? N -  Using the Toilet? N -  In the past six months, have you accidently leaked urine? N -  Do you have problems with loss of bowel control? N -  Managing your Medications? N -  Managing your Finances? N -  Housekeeping or managing your Housekeeping? N -  Some recent data might be hidden     Cognitive Testing  Alert? Yes  Normal Appearance?Yes  Oriented to person? Yes  Place? Yes   Time? Yes  Recall of three objects?  Yes  Can perform simple calculations?  Yes  Displays appropriate judgment?Yes  Can read the correct time from a watch face?Yes  EOL planning: Does Patient Have a Medical Advance Directive?: Yes Type of Advance Directive: Healthcare Power of Attorney, Living will Does patient want to make changes to medical advance directive?: No - Patient declined    Objective:   Blood pressure 108/72, pulse 73, temperature (!) 97.5 F (36.4 C), weight 198 lb 3.2 oz (89.9 kg), SpO2 99 %. Body mass index is 26.88 kg/m.  General appearance: alert, no distress, WD/WN, male, patient wearing  mask. HEENT: normocephalic, sclerae anicteric, TMs pearly, nares patent, no discharge or erythema, pharynx normal Oral cavity: MMM, no lesions Neck: supple, no lymphadenopathy, no thyromegaly, no masses Heart: RRR, normal S1, S2, no murmurs Lungs: CTA bilaterally, no wheezes, rhonchi, or rales Abdomen: +bs, soft, non tender, non distended, no masses, no hepatomegaly, no splenomegaly Musculoskeletal: nontender, no swelling, no obvious deformity Extremities: no edema, no cyanosis, no clubbing Pulses: 2+ symmetric, upper and lower extremities, normal cap refill Neurological: alert, oriented x 3, CN2-12 intact, strength normal upper extremities and lower extremities, , mild tremor of the right hand present, sensation normal throughout, DTRs 2+ throughout, no cerebellar signs, shuffling bent over gait with walker Psychiatric: normal affect, behavior normal, pleasant   Medicare Attestation I have personally reviewed: The patient's medical and social history Their use of alcohol, tobacco or illicit drugs Their current medications and supplements The patient's functional ability including ADLs,fall risks, home safety risks, cognitive, and hearing and visual impairment Diet and physical activities Evidence for depression or mood disorders  The patient's weight, height, BMI, and visual acuity have been recorded in the chart.  I have made referrals, counseling,  and provided education to the patient based on review of the above and I have provided the patient with a written personalized care plan for preventive services.     Elder Negus, Edrick Oh, DNP [facility] 01/22/2020  12:53 PM

## 2020-02-12 ENCOUNTER — Other Ambulatory Visit: Payer: Self-pay

## 2020-02-12 MED ORDER — OXYBUTYNIN CHLORIDE ER 10 MG PO TB24: 10.0000 mg | ORAL_TABLET | Freq: Every day | ORAL | 1 refills | Status: DC

## 2020-02-13 ENCOUNTER — Other Ambulatory Visit: Payer: Self-pay

## 2020-02-13 ENCOUNTER — Other Ambulatory Visit: Payer: Self-pay | Admitting: Adult Health Nurse Practitioner

## 2020-02-13 DIAGNOSIS — N3281 Overactive bladder: Secondary | ICD-10-CM

## 2020-02-13 MED ORDER — OXYBUTYNIN CHLORIDE ER 10 MG PO TB24: 10.0000 mg | ORAL_TABLET | Freq: Every day | ORAL | 3 refills | Status: DC

## 2020-02-13 MED ORDER — OXYBUTYNIN CHLORIDE ER 10 MG PO TB24: ORAL_TABLET | ORAL | 3 refills | Status: DC

## 2020-03-11 DIAGNOSIS — H524 Presbyopia: Secondary | ICD-10-CM | POA: Diagnosis not present

## 2020-03-11 DIAGNOSIS — H43393 Other vitreous opacities, bilateral: Secondary | ICD-10-CM | POA: Diagnosis not present

## 2020-03-11 DIAGNOSIS — H25813 Combined forms of age-related cataract, bilateral: Secondary | ICD-10-CM | POA: Diagnosis not present

## 2020-04-09 DIAGNOSIS — Z955 Presence of coronary angioplasty implant and graft: Secondary | ICD-10-CM | POA: Diagnosis not present

## 2020-04-09 DIAGNOSIS — I480 Paroxysmal atrial fibrillation: Secondary | ICD-10-CM | POA: Diagnosis not present

## 2020-04-09 DIAGNOSIS — E785 Hyperlipidemia, unspecified: Secondary | ICD-10-CM | POA: Diagnosis not present

## 2020-04-09 DIAGNOSIS — I2510 Atherosclerotic heart disease of native coronary artery without angina pectoris: Secondary | ICD-10-CM | POA: Diagnosis not present

## 2020-04-09 DIAGNOSIS — Z950 Presence of cardiac pacemaker: Secondary | ICD-10-CM | POA: Diagnosis not present

## 2020-04-09 DIAGNOSIS — I10 Essential (primary) hypertension: Secondary | ICD-10-CM | POA: Diagnosis not present

## 2020-04-09 DIAGNOSIS — I495 Sick sinus syndrome: Secondary | ICD-10-CM | POA: Diagnosis not present

## 2020-04-23 ENCOUNTER — Ambulatory Visit (INDEPENDENT_AMBULATORY_CARE_PROVIDER_SITE_OTHER): Payer: Medicare Other | Admitting: Internal Medicine

## 2020-04-23 ENCOUNTER — Other Ambulatory Visit: Payer: Self-pay

## 2020-04-23 VITALS — BP 116/80 | HR 72 | Temp 97.0°F | Resp 16 | Ht 72.0 in | Wt 194.4 lb

## 2020-04-23 DIAGNOSIS — E559 Vitamin D deficiency, unspecified: Secondary | ICD-10-CM

## 2020-04-23 DIAGNOSIS — E782 Mixed hyperlipidemia: Secondary | ICD-10-CM

## 2020-04-23 DIAGNOSIS — I2510 Atherosclerotic heart disease of native coronary artery without angina pectoris: Secondary | ICD-10-CM

## 2020-04-23 DIAGNOSIS — R7309 Other abnormal glucose: Secondary | ICD-10-CM

## 2020-04-23 DIAGNOSIS — I10 Essential (primary) hypertension: Secondary | ICD-10-CM | POA: Diagnosis not present

## 2020-04-23 DIAGNOSIS — Z79899 Other long term (current) drug therapy: Secondary | ICD-10-CM | POA: Diagnosis not present

## 2020-04-23 MED ORDER — TRAMADOL HCL 50 MG PO TABS: ORAL_TABLET | ORAL | 0 refills | Status: DC

## 2020-04-23 NOTE — Patient Instructions (Signed)

## 2020-04-23 NOTE — Progress Notes (Signed)
History of Present Illness:       This very nice 74 y.o.  MWM who presents for 6 month follow up with HTN, ASCAD,  HLD, Pre-Diabetes and Vitamin D Deficiency. Patient's Gout & GERD is quiescent on his meds. Patient has hx/o unstable Gait - evaluated by Dr Tat in the past and patient had a spinal tap to r/o NPH.       Patient's HTN predates from 9. Today's BP is at goal - 116/80.  In 2019, patient had ACS and underwent PCA/Stenting of the LAD.  He also had pAfib. Patient has had no recent complaints of any cardiac type chest pain, palpitations, dyspnea / orthopnea / PND, dizziness, claudication, or dependent edema.       Hyperlipidemia is controlled with diet & meds. Patient denies myalgias or other med SE's. Last Lipids were at goal except elevated Trig's:  Lab Results  Component Value Date   CHOL 134 10/22/2019   HDL 34 (L) 10/22/2019   LDLCALC 63 10/22/2019   TRIG 280 (H) 10/22/2019   CHOLHDL 3.9 10/22/2019    Also, the patient has moderate Obesity (BMI 30+) and is monitored expectantly for glucose intolerance.  Patient has had no symptoms of reactive hypoglycemia, diabetic polys, paresthesias or visual blurring.  Last A1c was Normal & at goal:  Lab Results  Component Value Date   HGBA1C 5.5 07/23/2019           Further, the patient also has history of Vitamin D Deficiency ("11"/2009)  and supplements vitamin D without any suspected side-effects. Last vitamin D was  Lab Results  Component Value Date   VD25OH 53 10/22/2019    Current Outpatient Medications on File Prior to Visit  Medication Sig  . atorvastatin (LIPITOR) 80 MG tablet Take 1 tablet Daily for Cholesterol  . Cholecalciferol (VITAMIN D) 2000 units tablet Take 8,000 Units by mouth every morning.   . diazepam (VALIUM) 5 MG tablet Take 1 tablet 3 x /day for Hereditary Tremor  . isosorbide mononitrate (IMDUR) 30 MG  TAKE 1 TABLET(30 MG) BY MOUTH DAILY  . losartan (COZAAR) 50 MG tablet Take 50 mg by mouth.   Marland Kitchen MAGNESIUM PO Take 500 mg by mouth 2 (two) times daily.   . metoprolol succ-XL 25 MG 24 hr tablet Take 1 tablet by mouth daily.  Marland Kitchen ROGAINE MENS 5 % FOAM Apply 1 application topically daily.  Marland Kitchen NITROSTAT 0.4 MG SL tablet as needed. For chest pain  . Omega-3 FISH OIL 1200 MG Take 2 capsules by mouth at bedtime.   . Oxybutynin -XL 10 MG 24 hr tablet Take one tablet three times a day.  . pantoprazole  40 MG tablet Take 40 mg by mouth 2 (two) times daily.  . traMADol  50 MG tablet TAKE 1 TABLETEVERY 6 HRS AS NEEDED  . triamcinolone oint  0.1 % Apply 1 application topically 3 (three) times daily  . vitamin B-12  1000 MCG tablet Take 1,000 mcg by mouth daily.   No current facility-administered medications on file prior to visit.    Allergies  Allergen Reactions  . Enalapril Rash  . Aspirin     Internal bleeding  . Nsaids     Internal bleeding  . Welchol [Colesevelam Hcl] Diarrhea  . Vasotec [Enalaprilat] Rash    PMHx:   Past Medical History:  Diagnosis Date  . CAD (coronary artery disease)   . Essential tremor   . Gastric ulcer 01/14/2015  . Gout   .  Hyperlipidemia   . Hypertension   . Parkinson's disease (HCC)   . Prediabetes   . Proteinuria   . Vitamin D deficiency     Immunization History  Administered Date(s) Administered  . Influenza Split 05/23/2013  . Influenza, High Dose Seasonal PF 05/14/2014, 06/11/2015, 05/19/2016, 06/10/2017, 05/20/2019  . PFIZER SARS-COV-2 Vaccination 10/11/2019, 11/01/2019  . Pneumococcal Conjugate-13 12/03/2014  . Pneumococcal Polysaccharide-23 06/01/2010  . Td 06/01/2010  . Tdap 01/28/2014  . Zoster 08/08/2004    Past Surgical History:  Procedure Laterality Date  . CHOLECYSTECTOMY    . CYST EXCISION     back  . KNEE SURGERY Right   . TONSILLECTOMY      FHx:    Reviewed / unchanged  SHx:    Reviewed / unchanged   Systems Review:  Constitutional: Denies fever, chills, wt changes, headaches, insomnia, fatigue, night sweats,  change in appetite. Eyes: Denies redness, blurred vision, diplopia, discharge, itchy, watery eyes.  ENT: Denies discharge, congestion, post nasal drip, epistaxis, sore throat, earache, hearing loss, dental pain, tinnitus, vertigo, sinus pain, snoring.  CV: Denies chest pain, palpitations, irregular heartbeat, syncope, dyspnea, diaphoresis, orthopnea, PND, claudication or edema. Respiratory: denies cough, dyspnea, DOE, pleurisy, hoarseness, laryngitis, wheezing.  Gastrointestinal: Denies dysphagia, odynophagia, heartburn, reflux, water brash, abdominal pain or cramps, nausea, vomiting, bloating, diarrhea, constipation, hematemesis, melena, hematochezia  or hemorrhoids. Genitourinary: Denies dysuria, frequency, urgency, nocturia, hesitancy, discharge, hematuria or flank pain. Musculoskeletal: Denies arthralgias, myalgias, stiffness, jt. swelling, pain, limping or strain/sprain.  Skin: Denies pruritus, rash, hives, warts, acne, eczema or change in skin lesion(s). Neuro: No weakness, tremor, incoordination, spasms, paresthesia or pain. Psychiatric: Denies confusion, memory loss or sensory loss. Endo: Denies change in weight, skin or hair change.  Heme/Lymph: No excessive bleeding, bruising or enlarged lymph nodes.  Physical Exam  BP 116/80   Pulse 72   Temp (!) 97 F (36.1 C)   Resp 16   Ht 6' (1.829 m)   Wt 194 lb 6.4 oz (88.2 kg)   BMI 26.37 kg/m   Appears  well nourished, well groomed  and in no distress.  Eyes: PERRLA, EOMs, conjunctiva no swelling or erythema. Sinuses: No frontal/maxillary tenderness ENT/Mouth: EAC's clear, TM's nl w/o erythema, bulging. Nares clear w/o erythema, swelling, exudates. Oropharynx clear without erythema or exudates. Oral hygiene is good. Tongue normal, non obstructing. Hearing intact.  Neck: Supple. Thyroid not palpable. Car 2+/2+ without bruits, nodes or JVD. Chest: Respirations nl with BS clear & equal w/o rales, rhonchi, wheezing or stridor.  Cor:  Heart sounds normal w/ regular rate and rhythm without sig. murmurs, gallops, clicks or rubs. Peripheral pulses normal and equal  without edema.  Abdomen: Soft & bowel sounds normal. Non-tender w/o guarding, rebound, hernias, masses or organomegaly.  Lymphatics: Unremarkable.  Musculoskeletal: Full ROM all peripheral extremities, joint stability, 5/5 strength and normal gait.  Skin: Warm, dry without exposed rashes, lesions or ecchymosis apparent.  Neuro: Cranial nerves intact, reflexes equal bilaterally. Sensory-motor testing grossly intact. Tendon reflexes grossly intact.  Pysch: Alert & oriented x 3.  Insight and judgement nl & appropriate. No ideations.  Assessment and Plan:  1. Essential hypertension  - Continue medication, monitor blood pressure at home.  - Continue DASH diet.  Reminder to go to the ER if any CP,  SOB, nausea, dizziness, severe HA, changes vision/speech.  - CBC with Differential/Platelet - COMPLETE METABOLIC PANEL WITH GFR - Magnesium - TSH  2. Hyperlipidemia, mixed  - Continue diet/meds, exercise,& lifestyle modifications.  -  Continue monitor periodic cholesterol/liver & renal functions   - Lipid panel  3. Abnormal glucose  - Continue diet, exercise  - Lifestyle modifications.  - Monitor appropriate labs.  - Hemoglobin A1c - Insulin, random  4. Vitamin D deficiency  - Continue supplementation.  - VITAMIN D 25 Hydroxy   5. Coronary artery disease involving native coronary artery  of native heart, angina presence unspecified  - Lipid panel  6. Medication management  - CBC with Differential/Platelet - COMPLETE METABOLIC PANEL WITH GFR - Magnesium - Lipid panel - TSH - Hemoglobin A1c - Insulin, random - VITAMIN D 25 Hydroxy        Discussed  regular exercise, BP monitoring, weight control to achieve/maintain BMI less than 25 and discussed med and SE's. Recommended labs to assess and monitor clinical status with further disposition  pending results of labs.  I discussed the assessment and treatment plan with the patient. The patient was provided an opportunity to ask questions and all were answered. The patient agreed with the plan and demonstrated an understanding of the instructions.  I provided over 30 minutes of exam, counseling, chart review and  complex critical decision making.   Marinus Maw, MD

## 2020-04-24 LAB — CBC WITH DIFFERENTIAL/PLATELET
Absolute Monocytes: 386 cells/uL (ref 200–950)
Basophils Absolute: 21 cells/uL (ref 0–200)
Basophils Relative: 0.5 %
Eosinophils Absolute: 151 cells/uL (ref 15–500)
Eosinophils Relative: 3.6 %
HCT: 40.9 % (ref 38.5–50.0)
Hemoglobin: 13.5 g/dL (ref 13.2–17.1)
Lymphs Abs: 1537 cells/uL (ref 850–3900)
MCH: 31.3 pg (ref 27.0–33.0)
MCHC: 33 g/dL (ref 32.0–36.0)
MCV: 94.9 fL (ref 80.0–100.0)
MPV: 10.1 fL (ref 7.5–12.5)
Monocytes Relative: 9.2 %
Neutro Abs: 2104 cells/uL (ref 1500–7800)
Neutrophils Relative %: 50.1 %
Platelets: 148 10*3/uL (ref 140–400)
RBC: 4.31 10*6/uL (ref 4.20–5.80)
RDW: 15.1 % — ABNORMAL HIGH (ref 11.0–15.0)
Total Lymphocyte: 36.6 %
WBC: 4.2 10*3/uL (ref 3.8–10.8)

## 2020-04-24 LAB — COMPLETE METABOLIC PANEL WITH GFR
AG Ratio: 1.6 (calc) (ref 1.0–2.5)
ALT: 13 U/L (ref 9–46)
AST: 17 U/L (ref 10–35)
Albumin: 4.1 g/dL (ref 3.6–5.1)
Alkaline phosphatase (APISO): 70 U/L (ref 35–144)
BUN/Creatinine Ratio: 22 (calc) (ref 6–22)
BUN: 27 mg/dL — ABNORMAL HIGH (ref 7–25)
CO2: 32 mmol/L (ref 20–32)
Calcium: 9.8 mg/dL (ref 8.6–10.3)
Chloride: 105 mmol/L (ref 98–110)
Creat: 1.25 mg/dL — ABNORMAL HIGH (ref 0.70–1.18)
GFR, Est African American: 64 mL/min/{1.73_m2} (ref >=60)
GFR, Est Non African American: 56 mL/min/{1.73_m2} — ABNORMAL LOW (ref >=60)
Globulin: 2.5 g/dL (calc) (ref 1.9–3.7)
Glucose, Bld: 85 mg/dL (ref 65–99)
Potassium: 5.1 mmol/L (ref 3.5–5.3)
Sodium: 141 mmol/L (ref 135–146)
Total Bilirubin: 0.8 mg/dL (ref 0.2–1.2)
Total Protein: 6.6 g/dL (ref 6.1–8.1)

## 2020-04-24 LAB — LIPID PANEL
Cholesterol: 110 mg/dL (ref <200)
HDL: 33 mg/dL — ABNORMAL LOW (ref >=40)
LDL Cholesterol (Calc): 58 mg/dL (calc)
Non-HDL Cholesterol (Calc): 77 mg/dL (calc) (ref <130)
Total CHOL/HDL Ratio: 3.3 (calc) (ref <5.0)
Triglycerides: 103 mg/dL (ref <150)

## 2020-04-24 LAB — HEMOGLOBIN A1C
Hgb A1c MFr Bld: 5.6 % of total Hgb (ref <5.7)
Mean Plasma Glucose: 114 (calc)
eAG (mmol/L): 6.3 (calc)

## 2020-04-24 LAB — MAGNESIUM: Magnesium: 1.8 mg/dL (ref 1.5–2.5)

## 2020-04-24 LAB — VITAMIN D 25 HYDROXY (VIT D DEFICIENCY, FRACTURES): Vit D, 25-Hydroxy: 74 ng/mL (ref 30–100)

## 2020-04-24 LAB — TSH: TSH: 1.6 mIU/L (ref 0.40–4.50)

## 2020-04-24 LAB — INSULIN, RANDOM: Insulin: 11.1 u[IU]/mL

## 2020-04-24 NOTE — Progress Notes (Signed)
========================================================== -   Test results slightly outside the reference range are not unusual. If there is anything important, I will review this with you,  otherwise it is considered normal test values.  If you have further questions,  please do not hesitate to contact me at the office or via My Chart.  ==========================================================  -  Kidney functions still Stage 3a and Stable  ==========================================================  -   Magnesium  -     -  very  low- goal is betw 2.0 - 2.5,   - So..............Marland Kitchen  Recommend that you add another  Magnesium 500 mg tablet daily   - also important to eat lots of  leafy green vegetables   - spinach - Kale - collards - greens - okra - asparagus  - broccoli - quinoa - squash - almonds   - black, red, white beans  -  peas - green beans ==========================================================  -  Total chol = 110 and LDL Chol = 58 - Both  Excellent   - Very low risk for Heart Attack  / Stroke =============================================================  - A1c - Normal - Great - No Diabetes ! ==========================================================  -  Vitamin D = 74 - Excellent  ==========================================================  -  All Else - CBC - Kidneys - Electrolytes - Liver - Magnesium & Thyroid    - all  Normal / OK ==========================================================

## 2020-04-25 ENCOUNTER — Encounter: Payer: Self-pay | Admitting: Internal Medicine

## 2020-04-29 DIAGNOSIS — I129 Hypertensive chronic kidney disease with stage 1 through stage 4 chronic kidney disease, or unspecified chronic kidney disease: Secondary | ICD-10-CM | POA: Diagnosis not present

## 2020-04-29 DIAGNOSIS — I2510 Atherosclerotic heart disease of native coronary artery without angina pectoris: Secondary | ICD-10-CM | POA: Diagnosis not present

## 2020-04-29 DIAGNOSIS — R351 Nocturia: Secondary | ICD-10-CM | POA: Diagnosis not present

## 2020-04-29 DIAGNOSIS — E785 Hyperlipidemia, unspecified: Secondary | ICD-10-CM | POA: Diagnosis not present

## 2020-04-29 DIAGNOSIS — E669 Obesity, unspecified: Secondary | ICD-10-CM | POA: Diagnosis not present

## 2020-04-29 DIAGNOSIS — R809 Proteinuria, unspecified: Secondary | ICD-10-CM | POA: Diagnosis not present

## 2020-04-29 DIAGNOSIS — Z23 Encounter for immunization: Secondary | ICD-10-CM | POA: Diagnosis not present

## 2020-05-07 ENCOUNTER — Ambulatory Visit (INDEPENDENT_AMBULATORY_CARE_PROVIDER_SITE_OTHER): Payer: Medicare Other | Admitting: Orthopedic Surgery

## 2020-05-07 ENCOUNTER — Other Ambulatory Visit: Payer: Self-pay

## 2020-05-07 ENCOUNTER — Encounter: Payer: Self-pay | Admitting: Orthopedic Surgery

## 2020-05-07 VITALS — Ht 72.0 in | Wt 194.4 lb

## 2020-05-07 DIAGNOSIS — I2510 Atherosclerotic heart disease of native coronary artery without angina pectoris: Secondary | ICD-10-CM | POA: Diagnosis not present

## 2020-05-07 DIAGNOSIS — L84 Corns and callosities: Secondary | ICD-10-CM | POA: Diagnosis not present

## 2020-05-07 NOTE — Progress Notes (Signed)
Office Visit Note   Patient: Leroy Griffin           Date of Birth: 15-Aug-1945           MRN: [id number] Visit Date: 05/07/2020              Requested by: Lucky Cowboy, MD [address] [place],  [place] [address] PCP: Lucky Cowboy, MD  Chief Complaint  Patient presents with  . Right Foot - Pain      HPI: Patient is a 74 year old gentleman who presents with a recurrent corn first webspace second toe he is status post a fusion of the second toe PIP joint.  Patient has been wearing a silicone spacer he has tried a felt pad without relief in the past he feels like the silicone spacer does help.  Assessment & Plan: Visit Diagnoses:  1. Corn of toe     Plan: The corn was pared without complications he will follow-up as needed continue with a silicone spacer.  Follow-Up Instructions: Return if symptoms worsen or fail to improve.   Ortho Exam  Patient is alert, oriented, no adenopathy, well-dressed, normal affect, normal respiratory effort. Examination of the right foot patient does have a corn in the first webspace PIP joint of the second toe.  After informed consent a 10 blade knife was used to pare the corn back to healthy viable tissue there is no signs of infection no exposed bone or tendon.  Patient had good relief after paring the corn.  Imaging: No results found. No images are attached to the encounter.  Labs: Lab Results  Component Value Date   HGBA1C 5.6 04/23/2020   HGBA1C 5.5 07/23/2019   HGBA1C 5.3 07/10/2018   ESRSEDRATE 7 08/26/2014   LABURIC 6.1 07/23/2019   LABURIC 4.9 07/10/2018   LABURIC 4.3 10/09/2017     Lab Results  Component Value Date   ALBUMIN 4.1 12/01/2016   ALBUMIN 3.8 09/01/2016   ALBUMIN 4.0 05/19/2016   LABURIC 6.1 07/23/2019   LABURIC 4.9 07/10/2018   LABURIC 4.3 10/09/2017    Lab Results  Component Value Date   MG 1.8 04/23/2020   MG 1.8 10/22/2019   MG 1.9 07/23/2019   Lab Results  Component Value  Date   VD25OH 74 04/23/2020   VD25OH 53 10/22/2019   VD25OH 61 07/23/2019    No results found for: PREALBUMIN CBC EXTENDED Latest Ref Rng & Units 04/23/2020 11/12/2019 10/22/2019  WBC 3.8 - 10.8 Thousand/uL 4.2 3.6(L) 5.5  RBC 4.20 - 5.80 Million/uL 4.31 2.85(L) 4.48  HGB 13.2 - 17.1 g/dL 91.6 3.8(G) 66.5  HCT 38 - 50 % 40.9 28.0(L) 42.7  PLT 140 - 400 Thousand/uL 148 216 159  NEUTROABS 1,500 - 7,800 cells/uL 2,104 1,642 2,954  LYMPHSABS 850 - 3,900 cells/uL 1,537 1,408 1,634     Body mass index is 26.37 kg/m.  Orders:  No orders of the defined types were placed in this encounter.  No orders of the defined types were placed in this encounter.    Procedures: No procedures performed  Clinical Data: No additional findings.  ROS:  All other systems negative, except as noted in the HPI. Review of Systems  Objective: Vital Signs: Ht 6' (1.829 m)   Wt 194 lb 6.4 oz (88.2 kg)   BMI 26.37 kg/m   Specialty Comments:  No specialty comments available.  PMFS History: Patient Active Problem List   Diagnosis Date Noted  . Other abnormalities of gait  and mobility 01/17/2019  . Depression due to physical illness 01/17/2019  . Overactive bladder 01/11/2018  . S/P drug eluting coronary stent placement 11/21/2017  . CAD in native artery 11/20/2017  . Anxiety 11/14/2017  . Overweight (BMI 25.0-29.9) 08/26/2014  . Sinus pause   With blocked PACs, PVCs and sinus arrhythmia 10/10/2013  . Medication management 10/06/2013  . Hyperlipidemia 08/27/2013  . Essential tremor   . Proteinuria   . Other abnormal glucose   . Gout   . Vitamin D deficiency   . Essential hypertension 10/10/2010   Past Medical History:  Diagnosis Date  . CAD (coronary artery disease)   . Essential tremor   . Gastric ulcer 01/14/2015  . Gout   . Hyperlipidemia   . Hypertension   . Parkinson's disease (HCC)   . Prediabetes   . Proteinuria   . Vitamin D deficiency     Family History  Problem Relation  Age of Onset  . Dementia Mother   . Stroke Father   . Emphysema Brother   . Heart disease Brother     Past Surgical History:  Procedure Laterality Date  . CHOLECYSTECTOMY    . CYST EXCISION     back  . KNEE SURGERY Right   . TONSILLECTOMY     Social History   Occupational History  . 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 Smoking status: Never Smoker  . Smokeless tobacco: Never Used  Vaping Use  . Vaping Use: Never used  Substance and Sexual Activity  . Alcohol use: Not Currently    Alcohol/week: 14.0 standard drinks    Types: 14 Standard drinks or equivalent per week  . Drug use: No  . Sexual activity: Not on file

## 2020-06-18 ENCOUNTER — Other Ambulatory Visit: Payer: Self-pay | Admitting: *Deleted

## 2020-06-18 MED ORDER — TRIAMCINOLONE ACETONIDE 0.1 % EX OINT: 1.0000 "application " | TOPICAL_OINTMENT | Freq: Three times a day (TID) | CUTANEOUS | 3 refills | Status: AC

## 2020-07-03 DIAGNOSIS — Z23 Encounter for immunization: Secondary | ICD-10-CM | POA: Diagnosis not present

## 2020-07-13 DIAGNOSIS — I495 Sick sinus syndrome: Secondary | ICD-10-CM | POA: Diagnosis not present

## 2020-08-17 ENCOUNTER — Encounter: Payer: Self-pay | Admitting: Internal Medicine

## 2020-08-17 ENCOUNTER — Other Ambulatory Visit: Payer: Self-pay | Admitting: Internal Medicine

## 2020-08-17 DIAGNOSIS — G250 Essential tremor: Secondary | ICD-10-CM

## 2020-08-17 NOTE — Patient Instructions (Signed)

## 2020-08-17 NOTE — Progress Notes (Signed)
Comprehensive Evaluation & Examination      This very nice 75 y.o.  MWM  presents for a  comprehensive evaluation and management of multiple medical co-morbidities.  Patient has been followed for HTN, ASHD/PPMHLD, Prediabetes and Vitamin D Deficiency. Patient has Gout & GERD  - both quiescent on his meds.       HTN predates since  1995. Patient's BP has been controlled at home.  Today's BP is at goal - 112/64.  In 2019, patient  PCA/Stenting of the LAD for ACS and he also has hx/o pAfib and is followed by [facility] and has had a PPM  (Feb 2021) implanted for SSS.  Patient has CKD 3a (GFR 56) attributed to his HTCVD.  Patient denies any cardiac symptoms as chest pain, palpitations, shortness of breath, dizziness or ankle swelling.      Patient's hyperlipidemia is controlled with diet and medications. Patient denies myalgias or other medication SE's. Last lipids were at goal:  Lab Results  Component Value Date   CHOL 110 04/23/2020   HDL 33 (L) 04/23/2020   LDLCALC 58 04/23/2020   TRIG 103 04/23/2020   CHOLHDL 3.3 04/23/2020       Patient is monitored for glucose intolerance.  Patient denies reactive hypoglycemic symptoms, visual blurring, diabetic polys or paresthesias. Last A1c was at goal:   Lab Results  Component Value Date   HGBA1C 5.6 04/23/2020         Finally, patient has history of Vitamin D Deficiency ("11"/2009) and last vitamin D was at goal:   Lab Results  Component Value Date   VD25OH 74 04/23/2020    Current Outpatient Medications on File Prior to Visit  Medication Sig  . acetaminophen (TYLENOL) 500 MG tablet Take 500 mg by mouth every 6 (six) hours as needed.  Marland Kitchen atorvastatin (LIPITOR) 80 MG tablet Take 1 tablet Daily for Cholesterol  . Cholecalciferol (VITAMIN D) 2000 units tablet Take 8,000 Units by mouth every morning.   . diazepam (VALIUM) 5 MG tablet Take     1 tablet     3 x /day        for Hereditary tremor  . isosorbide mononitrate  (IMDUR) 30 MG 24 hr tablet TAKE 1 TABLET(30 MG) BY MOUTH DAILY  . losartan (COZAAR) 50 MG tablet Take 50 mg by mouth.  Marland Kitchen MAGNESIUM PO Take by mouth. Takes magnesium 250 mg gummy twice a day.  . metoprolol succinate (TOPROL-XL) 25 MG 24 hr tablet Take 1 tablet by mouth daily.  . Minoxidil 5 % FOAM Apply 1 application topically daily.  . nitroGLYCERIN (NITROSTAT) 0.4 MG SL tablet Place 1 tablet under the tongue every 5 (five) minutes as needed. For chest pain  . Omega-3 Fatty Acids (FISH OIL) 1200 MG CPDR Take 2 capsules by mouth at bedtime.   Marland Kitchen oxybutynin -XL 10 MG 24 hr tablet Take one tablet three times a day.  . pantoprazole  40 MG tablet Take 40 mg by mouth daily.  . traMADol  50 MG tablet Take    1/2 to 1 tablet      every 4 hours      as needed for Pain  . triamcinolone ointment (KENALOG) 0.1 % Apply 1 application topically 3 (three) times daily.  . vitamin B-12 (CYANOCOBALAMIN) 1000 MCG tablet Take 1,000 mcg by mouth daily.   No current facility-administered medications on file prior to visit.    Allergies  Allergen Reactions  . Enalapril Rash  .  Aspirin     Internal bleeding  . Nsaids     Internal bleeding  . Welchol [Colesevelam Hcl] Diarrhea  . Vasotec [Enalaprilat] Rash    Past Medical History:  Diagnosis Date  . CAD (coronary artery disease)   . Essential tremor   . Gastric ulcer 01/14/2015  . Gout   . Hyperlipidemia   . Hypertension   . Parkinson's disease (HCC)   . Prediabetes   . Proteinuria   . Vitamin D deficiency    Health Maintenance  Topic Date Due  . Hepatitis C Screening  Never done  . COVID-19 Vaccine (3 - Booster for Pfizer series) 05/03/2020  . TETANUS/TDAP  01/29/2024  . INFLUENZA VACCINE  Completed  . PNA vac Low Risk Adult  Completed   Immunization History  Administered Date(s) Administered  . Influenza Split 05/23/2013  . Influenza, High Dose Seasonal PF 05/14/2014, 06/11/2015, 05/19/2016, 06/10/2017, 05/20/2019  . PFIZER SARS-COV-2  Vaccination 10/11/2019, 11/01/2019  . Pneumococcal Conjugate-13 12/03/2014  . Pneumococcal Polysaccharide-23 06/01/2010  . Td 06/01/2010  . Tdap 01/28/2014  . Zoster 08/08/2004   Last Colon - Last Colon - 02/03/2012 - Dr Neomia Glass  Past Surgical History:  Procedure Laterality Date  . CHOLECYSTECTOMY    . CYST EXCISION     back  . KNEE SURGERY Right   . TONSILLECTOMY     Family History  Problem Relation Age of Onset  . Dementia Mother   . Stroke Father   . Emphysema Brother   . Heart disease Brother    Social History   Socioeconomic History  . Marital status: Married    Spouse name: French Ana  . Number of children: 1 daughter  Occupational History  . 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 Smoking status: Never Smoker  . Smokeless tobacco: Never Used  Vaping Use  . Vaping Use: Never used  Substance and Sexual Activity  . Alcohol use: Not Currently    Alcohol/week: 14.0 standard drinks    Types: 14 Standard drinks or equivalent per week  . Drug use: No  . Sexual activity: Not on file    ROS Constitutional: Denies fever, chills, weight loss/gain, headaches, insomnia,  night sweats or change in appetite. Does c/o fatigue. Eyes: Denies redness, blurred vision, diplopia, discharge, itchy or watery eyes.  ENT: Denies discharge, congestion, post nasal drip, epistaxis, sore throat, earache, hearing loss, dental pain, Tinnitus, Vertigo, Sinus pain or snoring.  Cardio: Denies chest pain, palpitations, irregular heartbeat, syncope, dyspnea, diaphoresis, orthopnea, PND, claudication or edema Respiratory: denies cough, dyspnea, DOE, pleurisy, hoarseness, laryngitis or wheezing.  Gastrointestinal: Denies dysphagia, heartburn, reflux, water brash, pain, cramps, nausea, vomiting, bloating, diarrhea, constipation, hematemesis, melena, hematochezia, jaundice or hemorrhoids Genitourinary: Denies dysuria, frequency, urgency, nocturia,  hesitancy, discharge, hematuria or flank pain Musculoskeletal: Denies arthralgia, myalgia, stiffness, Jt. Swelling, pain, limp or strain/sprain. Denies Falls. Skin: Denies puritis, rash, hives, warts, acne, eczema or change in skin lesion Neuro: No weakness, tremor, incoordination, spasms, paresthesia or pain Psychiatric: Denies confusion, memory loss or sensory loss. Denies Depression. Endocrine: Denies change in weight, skin, hair change, nocturia, and paresthesia, diabetic polys, visual blurring or hyper / hypo glycemic episodes.  Heme/Lymph: No excessive bleeding, bruising or enlarged lymph nodes.  Physical Exam  BP 112/64   Pulse 71   Temp (!) 97.2 F (36.2 C)   Resp 16   Ht 6' 0.75" (1.848 m)   Wt 196 lb 9.6 oz (89.2 kg)  SpO2 97%   BMI 26.12 kg/m   General Appearance: Well nourished and well groomed and in no apparent distress.  Eyes: PERRLA, EOMs, conjunctiva no swelling or erythema, normal fundi and vessels. Sinuses: No frontal/maxillary tenderness ENT/Mouth: EACs patent / TMs  nl. Nares clear without erythema, swelling, mucoid exudates. Oral hygiene is good. No erythema, swelling, or exudate. Tongue normal, non-obstructing. Tonsils not swollen or erythematous. Hearing normal.  Neck: Supple, thyroid not palpable. No bruits, nodes or JVD. Respiratory: Respiratory effort normal.  BS equal and clear bilateral without rales, rhonci, wheezing or stridor. Cardio: Heart sounds are normal with regular rate and rhythm and no murmurs, rubs or gallops. Peripheral pulses are normal and equal bilaterally without edema. No aortic or femoral bruits. Chest: symmetric with normal excursions and percussion.  Abdomen: Soft, with Nl bowel sounds. Nontender, no guarding, rebound, hernias, masses, or organomegaly.  Lymphatics: Non tender without lymphadenopathy.  Musculoskeletal: Full ROM all peripheral extremities, joint stability, 5/5 strength, and normal gait. Skin: Warm and dry without  rashes, lesions, cyanosis, clubbing or  ecchymosis.  Neuro: Cranial nerves intact, reflexes equal bilaterally. Normal muscle tone, no cerebellar symptoms. Sensation intact.  Pysch: Alert and oriented X 3 with normal affect, insight and judgment appropriate.   Assessment and Plan  1. Essential hypertension  - EKG 12-Lead - [place], RETROPERITNL ABD,  LTD - Urinalysis, Routine w reflex microscopic - Microalbumin / creatinine urine ratio - CBC with Differential/Platelet - COMPLETE METABOLIC PANEL WITH GFR - Magnesium - TSH  2. Hyperlipidemia, mixed  - EKG 12-Lead - [place], RETROPERITNL ABD,  LTD - Lipid panel - TSH  3. Abnormal glucose  - EKG 12-Lead - [place], RETROPERITNL ABD,  LTD - Hemoglobin A1c - Insulin, random  4. Vitamin D deficiency  - VITAMIN D 25 Hydroxy  5. Coronary artery disease involving native coronary  artery of native heart without angina pectoris  - EKG 12-Lead - Lipid panel  6. SSS (sick sinus syndrome) (HCC)  - EKG 12-Lead  7. Presence of permanent cardiac pacemaker  - EKG 12-Lead  8. BPH with obstruction/lower urinary tract symptoms  - PSA  9. Idiopathic gout  - Uric acid  10. Chronic renal failure, stage 3a (HCC)  - Urinalysis, Routine w reflex microscopic - Microalbumin / creatinine urine ratio - PTH, intact and calcium - COMPLETE METABOLIC PANEL WITH GFR  11. Essential tremor   12. Screening for colorectal cancer  - POC Hemoccult Bld/Stl   13. Prostate cancer screening  - PSA  14. Screening for ischemic heart disease  - EKG 12-Lead  15. FHx: heart disease  - EKG 12-Lead - [place], RETROPERITNL ABD,  LTD  16. Screening for AAA (aortic abdominal aneurysm)  - [place], RETROPERITNL ABD,  LTD  17. Medication management  - Urinalysis, Routine w reflex microscopic - Microalbumin / creatinine urine ratio - CBC with Differential/Platelet - COMPLETE METABOLIC PANEL WITH GFR - Magnesium - Lipid panel - TSH - Hemoglobin A1c -  Insulin, random - VITAMIN D 25 Hydroxy          Patient was counseled in prudent diet, weight control to achieve/maintain BMI less than 25, BP monitoring, regular exercise and medications as discussed.  Discussed med effects and SE's. Routine screening labs and tests as requested with regular follow-up as recommended. Over 40 minutes of exam, counseling, chart review and high complex critical decision making was performed   Leroy Maw, MD

## 2020-08-18 ENCOUNTER — Other Ambulatory Visit: Payer: Self-pay

## 2020-08-18 ENCOUNTER — Ambulatory Visit (INDEPENDENT_AMBULATORY_CARE_PROVIDER_SITE_OTHER): Payer: Medicare Other | Admitting: Internal Medicine

## 2020-08-18 VITALS — BP 112/64 | HR 71 | Temp 97.2°F | Resp 16 | Ht 72.75 in | Wt 196.6 lb

## 2020-08-18 DIAGNOSIS — Z1211 Encounter for screening for malignant neoplasm of colon: Secondary | ICD-10-CM

## 2020-08-18 DIAGNOSIS — M1000 Idiopathic gout, unspecified site: Secondary | ICD-10-CM

## 2020-08-18 DIAGNOSIS — I2510 Atherosclerotic heart disease of native coronary artery without angina pectoris: Secondary | ICD-10-CM | POA: Diagnosis not present

## 2020-08-18 DIAGNOSIS — Z8249 Family history of ischemic heart disease and other diseases of the circulatory system: Secondary | ICD-10-CM

## 2020-08-18 DIAGNOSIS — N1831 Chronic kidney disease, stage 3a: Secondary | ICD-10-CM

## 2020-08-18 DIAGNOSIS — E559 Vitamin D deficiency, unspecified: Secondary | ICD-10-CM

## 2020-08-18 DIAGNOSIS — I10 Essential (primary) hypertension: Secondary | ICD-10-CM | POA: Diagnosis not present

## 2020-08-18 DIAGNOSIS — Z955 Presence of coronary angioplasty implant and graft: Secondary | ICD-10-CM | POA: Diagnosis not present

## 2020-08-18 DIAGNOSIS — R7309 Other abnormal glucose: Secondary | ICD-10-CM | POA: Diagnosis not present

## 2020-08-18 DIAGNOSIS — E782 Mixed hyperlipidemia: Secondary | ICD-10-CM

## 2020-08-18 DIAGNOSIS — Z125 Encounter for screening for malignant neoplasm of prostate: Secondary | ICD-10-CM | POA: Diagnosis not present

## 2020-08-18 DIAGNOSIS — G250 Essential tremor: Secondary | ICD-10-CM

## 2020-08-18 DIAGNOSIS — Z950 Presence of cardiac pacemaker: Secondary | ICD-10-CM

## 2020-08-18 DIAGNOSIS — Z79899 Other long term (current) drug therapy: Secondary | ICD-10-CM

## 2020-08-18 DIAGNOSIS — N401 Enlarged prostate with lower urinary tract symptoms: Secondary | ICD-10-CM | POA: Diagnosis not present

## 2020-08-18 DIAGNOSIS — N138 Other obstructive and reflux uropathy: Secondary | ICD-10-CM

## 2020-08-18 DIAGNOSIS — Z136 Encounter for screening for cardiovascular disorders: Secondary | ICD-10-CM

## 2020-08-18 DIAGNOSIS — I495 Sick sinus syndrome: Secondary | ICD-10-CM

## 2020-08-18 MED ORDER — ASPIRIN EC 81 MG PO TBEC: DELAYED_RELEASE_TABLET | ORAL | Status: AC

## 2020-08-19 LAB — VITAMIN D 25 HYDROXY (VIT D DEFICIENCY, FRACTURES): Vit D, 25-Hydroxy: 74 ng/mL (ref 30–100)

## 2020-08-19 LAB — URINALYSIS, ROUTINE W REFLEX MICROSCOPIC
Bacteria, UA: NONE SEEN /HPF
Bilirubin Urine: NEGATIVE
Glucose, UA: NEGATIVE
Hgb urine dipstick: NEGATIVE
Hyaline Cast: NONE SEEN /LPF
Ketones, ur: NEGATIVE
Leukocytes,Ua: NEGATIVE
Nitrite: NEGATIVE
Specific Gravity, Urine: 1.024 (ref 1.001–1.03)
Squamous Epithelial / HPF: NONE SEEN /HPF (ref <=5)
WBC, UA: NONE SEEN /HPF (ref 0–5)
pH: <=5 (ref 5.0–8.0)

## 2020-08-19 LAB — LIPID PANEL
Cholesterol: 115 mg/dL (ref <200)
HDL: 37 mg/dL — ABNORMAL LOW (ref >=40)
LDL Cholesterol (Calc): 62 mg/dL (calc)
Non-HDL Cholesterol (Calc): 78 mg/dL (calc) (ref <130)
Total CHOL/HDL Ratio: 3.1 (calc) (ref <5.0)
Triglycerides: 82 mg/dL (ref <150)

## 2020-08-19 LAB — CBC WITH DIFFERENTIAL/PLATELET
Absolute Monocytes: 376 cells/uL (ref 200–950)
Basophils Absolute: 30 cells/uL (ref 0–200)
Basophils Relative: 0.8 %
Eosinophils Absolute: 110 cells/uL (ref 15–500)
Eosinophils Relative: 2.9 %
HCT: 39.5 % (ref 38.5–50.0)
Hemoglobin: 13.4 g/dL (ref 13.2–17.1)
Lymphs Abs: 1281 cells/uL (ref 850–3900)
MCH: 32.1 pg (ref 27.0–33.0)
MCHC: 33.9 g/dL (ref 32.0–36.0)
MCV: 94.7 fL (ref 80.0–100.0)
MPV: 9.9 fL (ref 7.5–12.5)
Monocytes Relative: 9.9 %
Neutro Abs: 2003 cells/uL (ref 1500–7800)
Neutrophils Relative %: 52.7 %
Platelets: 169 10*3/uL (ref 140–400)
RBC: 4.17 10*6/uL — ABNORMAL LOW (ref 4.20–5.80)
RDW: 12.5 % (ref 11.0–15.0)
Total Lymphocyte: 33.7 %
WBC: 3.8 10*3/uL (ref 3.8–10.8)

## 2020-08-19 LAB — COMPLETE METABOLIC PANEL WITH GFR
AG Ratio: 1.4 (calc) (ref 1.0–2.5)
ALT: 17 U/L (ref 9–46)
AST: 22 U/L (ref 10–35)
Albumin: 3.7 g/dL (ref 3.6–5.1)
Alkaline phosphatase (APISO): 64 U/L (ref 35–144)
BUN: 21 mg/dL (ref 7–25)
CO2: 30 mmol/L (ref 20–32)
Calcium: 9.7 mg/dL (ref 8.6–10.3)
Chloride: 108 mmol/L (ref 98–110)
Creat: 1.11 mg/dL (ref 0.70–1.18)
GFR, Est African American: 74 mL/min/{1.73_m2} (ref >=60)
GFR, Est Non African American: 64 mL/min/{1.73_m2} (ref >=60)
Globulin: 2.6 g/dL (calc) (ref 1.9–3.7)
Glucose, Bld: 87 mg/dL (ref 65–99)
Potassium: 4.9 mmol/L (ref 3.5–5.3)
Sodium: 144 mmol/L (ref 135–146)
Total Bilirubin: 0.5 mg/dL (ref 0.2–1.2)
Total Protein: 6.3 g/dL (ref 6.1–8.1)

## 2020-08-19 LAB — PSA: PSA: 1.06 ng/mL (ref <=4.0)

## 2020-08-19 LAB — HEMOGLOBIN A1C
Hgb A1c MFr Bld: 5.5 % of total Hgb (ref <5.7)
Mean Plasma Glucose: 111 mg/dL
eAG (mmol/L): 6.2 mmol/L

## 2020-08-19 LAB — PTH, INTACT AND CALCIUM
Calcium: 9.7 mg/dL (ref 8.6–10.3)
PTH: 40 pg/mL (ref 14–64)

## 2020-08-19 LAB — INSULIN, RANDOM: Insulin: 5.6 u[IU]/mL

## 2020-08-19 LAB — TSH: TSH: 1.21 mIU/L (ref 0.40–4.50)

## 2020-08-19 LAB — URIC ACID: Uric Acid, Serum: 6.1 mg/dL (ref 4.0–8.0)

## 2020-08-19 LAB — MAGNESIUM: Magnesium: 1.8 mg/dL (ref 1.5–2.5)

## 2020-08-19 LAB — MICROALBUMIN / CREATININE URINE RATIO
Creatinine, Urine: 150 mg/dL (ref 20–320)
Microalb Creat Ratio: 1212 mcg/mg creat — ABNORMAL HIGH (ref <30)
Microalb, Ur: 181.8 mg/dL

## 2020-08-19 NOTE — Progress Notes (Signed)
========================================================== -   Test results slightly outside the reference range are not unusual. If there is anything important, I will review this with you,  otherwise it is considered normal test values.  If you have further questions,  please do not hesitate to contact me at the office or via My Chart.  ========================================================== ==========================================================  -  PSA - OK  ==========================================================  -  Uric Acid / Gout  test - OK ==========================================================  -   Magnesium  -   1.8  -  very  low- goal is betw 2.0 - 2.5,   - So..............Marland Kitchen  Recommend that you take  Magnesium 500 mg tablet daily   - also important to eat lots of  leafy green vegetables   - spinach - Kale - collards - greens - okra - asparagus  - broccoli - quinoa - squash - almonds   - black, red, white beans  -  peas - green beans ==========================================================  -  Total Chol = 115 and LDL Chol = 62 - Both  Excellent   - Very low risk for Heart Attack  / Stroke ========================================================  - A1c - Normal - Great - No Diabetes  ! ==========================================================  -  Vitamin D = 74   -   Excellent ==========================================================  -  All Else - CBC - Kidneys - Electrolytes - Liver - Magnesium & Thyroid    - all  Normal / OK ===========================================================  ===========================================================

## 2020-10-07 DIAGNOSIS — L639 Alopecia areata, unspecified: Secondary | ICD-10-CM | POA: Diagnosis not present

## 2020-10-10 ENCOUNTER — Other Ambulatory Visit: Payer: Self-pay | Admitting: Internal Medicine

## 2020-10-10 DIAGNOSIS — I2510 Atherosclerotic heart disease of native coronary artery without angina pectoris: Secondary | ICD-10-CM

## 2020-10-10 DIAGNOSIS — E7800 Pure hypercholesterolemia, unspecified: Secondary | ICD-10-CM

## 2020-10-10 MED ORDER — ATORVASTATIN CALCIUM 80 MG PO TABS
ORAL_TABLET | ORAL | 0 refills | Status: DC
Start: 2020-10-10 — End: 2021-01-13

## 2020-10-12 DIAGNOSIS — I2510 Atherosclerotic heart disease of native coronary artery without angina pectoris: Secondary | ICD-10-CM | POA: Diagnosis not present

## 2020-10-12 DIAGNOSIS — Z955 Presence of coronary angioplasty implant and graft: Secondary | ICD-10-CM | POA: Diagnosis not present

## 2020-10-12 DIAGNOSIS — E785 Hyperlipidemia, unspecified: Secondary | ICD-10-CM | POA: Diagnosis not present

## 2020-10-12 DIAGNOSIS — I480 Paroxysmal atrial fibrillation: Secondary | ICD-10-CM | POA: Diagnosis not present

## 2020-10-12 DIAGNOSIS — I495 Sick sinus syndrome: Secondary | ICD-10-CM | POA: Diagnosis not present

## 2020-10-12 DIAGNOSIS — Z950 Presence of cardiac pacemaker: Secondary | ICD-10-CM | POA: Diagnosis not present

## 2020-10-12 DIAGNOSIS — I200 Unstable angina: Secondary | ICD-10-CM | POA: Diagnosis not present

## 2020-11-17 DIAGNOSIS — L639 Alopecia areata, unspecified: Secondary | ICD-10-CM | POA: Diagnosis not present

## 2020-11-18 DIAGNOSIS — Z950 Presence of cardiac pacemaker: Secondary | ICD-10-CM | POA: Insufficient documentation

## 2020-11-18 NOTE — Progress Notes (Deleted)
MEDICARE ANNUAL WELLNESS VISIT AND FOLLOW UP Assessment:   Diagnoses and all orders for this visit:  Encounter for Medicare annual wellness exam Yearly  Sinus pause   With blocked PACs, PVCs and sinus arrhythmia Rate and rhythm regular today, continue BB, followed by cardiology  Essential hypertension Continue medications Monitor blood pressure at home; call if consistently over 130/80 Continue DASH diet.   Reminder to go to the ER if any CP, SOB, nausea, dizziness, severe HA, changes vision/speech, left arm numbness and tingling and jaw pain.  Essential tremor/abnormal gait and mobility Poorly explained episodic; walking with walker at this time; Parkinson's ruled out; followed by Dr. Arbutus Leas Recently underwent "LSVT" therapy in past with benefit.  Vitamin D deficiency At goal at recent check; continue to recommend supplementation for goal of 70-100 Defer vitamin D level  Other abnormal glucose Recent A1Cs at goal Discussed diet/exercise, weight management  Defer A1C; check CMP  Overweight (BMI 25.0-29.9) Long discussion about weight loss, diet, and exercise Recommended diet heavy in fruits and veggies and low in animal meats, cheeses, and dairy products, appropriate calorie intake Discussed appropriate weight for height  Continue heart healthy diet, daily exercise Follow up at next visit  Hyperlipidemia Continue medications Continue low cholesterol diet and exercise.  Check lipid panel.   Idiopathic gout, unspecified chronicity, unspecified site Continue allopurinol Diet discussed Check uric acid as needed  Proteinuria, unspecified type Followed by Dr. Kathrene Bongo  Overactive bladder Severe, limiting quality of life Manged by Dr. Annabell Howells, some spotty benefit with ER oxybutynin Has close follow up scheduled, discussing botox   Coronary artery disease involving native coronary artery of native heart, angina presence unspecified Control blood pressure, cholesterol,  glucose, increase exercise.  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 Annabell Howells sooner if needed, therapy via Dr. Arlana Pouch Wife agrees to monitor closely, call/follow up or present to ED if acutely worse Montee was seen today for follow-up and medicare wellness.  S/P IVC filter Has follow up scheduled  DVT of axillary vein, acute bilateral (HCC) IVC filter in place.  Gastrointestinal hemorrhage associated with duodenal ulcer Avoid NSAID's Continue to monitor   Continue diet and meds as discussed. Further disposition pending results of labs. Over 40 minutes of face to face interview, exam, counseling, chart review, and critical decision making was performed.  Future Appointments  Date Time Provider Department Center  11/19/2020 11:30 AM Judd Gaudier, NP [facility] None  03/08/2021 10:30 AM Lucky Cowboy, MD [facility] None  09/01/2021  3:00 PM Lucky Cowboy, MD [facility] None     Plan:   During the course of the visit the patient was educated and counseled about appropriate screening and preventive services including:    Pneumococcal vaccine   Hepatitis B vaccine  Td vaccine  Screening electrocardiogram  Colorectal cancer screening  Diabetes screening  Glaucoma screening  Nutrition counseling    Subjective:  Leroy Griffin is a 75 y.o. male who presents accompanied for Medicare Annual Wellness Visit and 6 month follow up for HTN, HLD, prediabetes, and vitamin D Def.   He was hospitalized two months ago, 11/15/19 -11/20/19 for acute GI bleed with hypotension related to  volume from  acute blood loss.  He was having chest pain that he reports was resolved by taking nitroglycerine and had second episode prior to arrival at ED.  He was also having bright red blood per rectum with melena, lightheadedness and dizziness.  He had an EGD which showed deep cratered ulcer requiring IR intervention.  He received 5 units blood and at discharge hgb 8.2.  He was started on BID PPI, GI consult, if re-occurrence then IR surgical intervened would be needed.  IVC filter placed for nonocclusive DVT in right popliteal and left proximal mid distal superficial femoral vein.  No edema to bilateral extremities.  Losartan was held related to hypotension during hospital admission.  bASA discontinued related to increase risk of bleeding.   He had sepsis consequent of obstructed bile duct and cholecystitis in 07/2018 and reports recovery has been slow, has completed home therapy, also reports he underwent LSVT BIG treatment that he finished up a few weeks ago, that was beneficial. Patient has also been evaluated by Dr Tat for Tremor - presumed Hereditary or Essential and she ruled out Parkinson's. Because of gait difficulty, he also had high volume LP w/o any improvement in his gait. He is also now followed by Dr. Otelia Sergeant for back pain and bilateral knee pain. He has hx of ankylosing spondylitis with bone spurs remotely, discussed surgery but ultimately declined. Has valium PRN.    Patient had negative screening stress test and Cardiac MRI in Feb 2017 per Dr Graciela Husbands, however demonstrated unstable angina recently in 11/2017 and underwent diagnostic heart cath by Dr. Satira Sark. Cair which demonstrated high-grade stenosis of distal LAD (85-90%) and diagonal (90%) - stent was placed to LAD with balloon inflation of diagonal with improvement to 50%. He is on brilinta and bASA. He has follow up with Dr. Neale Burly and Dr. Graciela Husbands.   Has urinary frequency, nocturia x 5-8, very rare incontinence if he waits too long. Occ  hesitancy, dribbling, weak stream. - has seen Dr. Annabell Howells in recent weeks, reports normal prostate, has small bladder and OAB, has been restarted on ER oxybutynin with "spotty" benefit per patient. He did try myrbetriq without benefit. Has upcoming follow up appointment later this month, discussing possible botox, cath.   BMI is There is no height or weight on file to calculate BMI., he has been working on diet and exercise. Wife is strictly watching his diet for heart healthy diet and portion control.  Wt Readings from Last 3 Encounters:  08/18/20 196 lb 9.6 oz (89.2 kg)  05/07/20 194 lb 6.4 oz (88.2 kg)  04/23/20 194 lb 6.4 oz (88.2 kg)   His blood pressure has been controlled at home, today their BP is   He denies chest pain, shortness of breath, dizziness.  He is on cholesterol medication (atorvastatin 40 mg daily) and denies myalgias. His cholesterol is at goal. The cholesterol last visit was:   Lab Results  Component Value Date   CHOL 115 08/18/2020   HDL 37 (L) 08/18/2020   LDLCALC 62 08/18/2020   TRIG 82 08/18/2020   CHOLHDL 3.1 08/18/2020   He has been working on diet and exercise for glucose management, and denies paresthesia of the feet, polydipsia and polyuria. Last A1C in the office was:  Lab Results  Component Value Date   HGBA1C 5.5 08/18/2020   Patient also has Idiopathic proteinuria & CKD 2 followed by Dr Julien Girt.  Lab Results  Component Value Date   GFRNONAA 64 08/18/2020   Patient is on  Vitamin D supplement.   Lab Results  Component Value Date   VD25OH 74 08/18/2020       Medication Review: Current Outpatient Medications on File Prior to Visit  Medication Sig Dispense Refill  . acetaminophen (TYLENOL) 500 MG tablet Take 500 mg by mouth every 6 (six) hours as needed.    Marland Kitchen. aspirin EC 81 MG tablet Take 1 tablet Daily    . atorvastatin (LIPITOR) 80 MG tablet Take 1 tablet Daily for Cholesterol 90 tablet 0  . Cholecalciferol (VITAMIN D) 2000 units  tablet Take 8,000 Units by mouth every morning.     . diazepam (VALIUM) 5 MG tablet Take     1 tablet     3 x /day        for Hereditary tremor 270 tablet 0  . isosorbide mononitrate (IMDUR) 30 MG 24 hr tablet TAKE 1 TABLET(30 MG) BY MOUTH DAILY    . losartan (COZAAR) 50 MG tablet Take 50 mg by mouth.    Marland Kitchen. MAGNESIUM PO Take by mouth. Takes magnesium 250 mg gummy twice a day.    . metoprolol succinate (TOPROL-XL) 25 MG 24 hr tablet Take 1 tablet by mouth daily.    . Minoxidil 5 % FOAM Apply 1 application topically daily.    . nitroGLYCERIN (NITROSTAT) 0.4 MG SL tablet Place 1 tablet under the tongue every 5 (five) minutes as needed. For chest pain    . Omega-3 Fatty Acids (FISH OIL) 1200 MG CPDR Take 2 capsules by mouth at bedtime.     Marland Kitchen. oxybutynin (DITROPAN-XL) 10 MG 24 hr tablet Take one tablet three times a day. 90 tablet 3  . pantoprazole (PROTONIX) 40 MG tablet Take 40 mg by mouth daily.    . traMADol (ULTRAM) 50 MG tablet Take    1/2 to 1 tablet      every 4 hours      as needed for Pain 30 tablet 0  . triamcinolone ointment (KENALOG) 0.1 % Apply 1 application topically 3 (three) times daily. 80 g 3  . vitamin B-12 (CYANOCOBALAMIN) 1000 MCG tablet Take 1,000 mcg by mouth daily.     No current facility-administered medications on file prior to visit.    Current Problems (verified) Patient Active Problem List   Diagnosis Date Noted  . S/P placement of cardiac pacemaker 11/18/2020  . Other abnormalities of gait and mobility 01/17/2019  . Depression due to physical illness 01/17/2019  . Overactive bladder 01/11/2018  . S/P drug eluting coronary stent placement 11/21/2017  . CAD in native artery 11/20/2017  . Anxiety 11/14/2017  . Overweight (BMI 25.0-29.9) 08/26/2014  . Sick sinus syndrome (HCC) 10/10/2013  . Medication management 10/06/2013  . Hyperlipidemia 08/27/2013  . Essential tremor   . Proteinuria   . Other abnormal glucose   . Gout   . Vitamin D deficiency   . Essential  hypertension 10/10/2010     Allergies Allergies  Allergen Reactions  . Enalapril Rash  . Nsaids     Internal bleeding  . Welchol [Colesevelam Hcl] Diarrhea  . Vasotec [Enalaprilat] Rash    SURGICAL HISTORY He  has a past surgical history that includes Knee surgery (Right); Tonsillectomy; Cyst excision; and Cholecystectomy. FAMILY HISTORY His family history includes Dementia in his mother; Emphysema in his brother; Heart disease in his brother; Stroke in his father. SOCIAL HISTORY He  reports that he has never smoked. He has never used smokeless tobacco. He reports previous alcohol use of about 14.0 standard  drinks of alcohol per week. He reports that he does not use drugs.  ***  Review of Systems  Constitutional: Negative for malaise/fatigue and weight loss.  HENT: Negative for hearing loss and tinnitus.   Eyes: Negative for blurred vision and double vision.  Respiratory: Negative for cough, shortness of breath and wheezing.   Cardiovascular: Negative for chest pain, palpitations, orthopnea, claudication and leg swelling.  Gastrointestinal: Negative for abdominal pain, blood in stool, constipation, diarrhea, heartburn, melena, nausea and vomiting.  Genitourinary: Negative.   Musculoskeletal: Negative for joint pain and myalgias.  Skin: Negative for rash.  Neurological: Negative for dizziness, tingling, sensory change, weakness and headaches.  Endo/Heme/Allergies: Negative for polydipsia.  Psychiatric/Behavioral: Negative.   All other systems reviewed and are negative.    Objective:   There were no vitals taken for this visit. There is no height or weight on file to calculate BMI.  General appearance: alert, no distress, WD/WN, male, patient wearing mask. HEENT: normocephalic, sclerae anicteric, TMs pearly, nares patent, no discharge or erythema, pharynx normal Oral cavity: MMM, no lesions Neck: supple, no lymphadenopathy, no thyromegaly, no masses Heart: RRR, normal  S1, S2, no murmurs Lungs: CTA bilaterally, no wheezes, rhonchi, or rales Abdomen: +bs, soft, non tender, non distended, no masses, no hepatomegaly, no splenomegaly Musculoskeletal: nontender, no swelling, no obvious deformity Extremities: no edema, no cyanosis, no clubbing Pulses: 2+ symmetric, upper and lower extremities, normal cap refill Neurological: alert, oriented x 3, CN2-12 intact, strength normal upper extremities and lower extremities, , mild tremor of the right hand present, sensation normal throughout, DTRs 2+ throughout, no cerebellar signs, shuffling bent over gait with walker Psychiatric: normal affect, behavior normal, pleasant    Dan Maker, NP 1:15 PM [facility]

## 2020-11-19 ENCOUNTER — Ambulatory Visit: Payer: Medicare Other | Admitting: Adult Health

## 2020-12-09 DIAGNOSIS — H903 Sensorineural hearing loss, bilateral: Secondary | ICD-10-CM | POA: Diagnosis not present

## 2020-12-09 DIAGNOSIS — H838X3 Other specified diseases of inner ear, bilateral: Secondary | ICD-10-CM | POA: Diagnosis not present

## 2020-12-15 ENCOUNTER — Other Ambulatory Visit: Payer: Self-pay

## 2020-12-15 ENCOUNTER — Ambulatory Visit: Payer: Medicare Other | Admitting: Internal Medicine

## 2020-12-15 VITALS — BP 110/66 | HR 73 | Temp 97.7°F | Resp 16 | Ht 72.75 in | Wt 197.2 lb

## 2020-12-15 DIAGNOSIS — L821 Other seborrheic keratosis: Secondary | ICD-10-CM

## 2020-12-15 DIAGNOSIS — L723 Sebaceous cyst: Secondary | ICD-10-CM

## 2020-12-15 NOTE — Progress Notes (Signed)
Future Appointments  Date Time Provider Department Center  12/15/2020  3:00 PM Lucky Cowboy, MD [facility] None  03/23/2021 11:00 AM Judd Gaudier, NP [facility] None  09/01/2021  3:00 PM Lucky Cowboy, MD [facility] None    History of Present Illness:    Patient present at wife's recommendation to evaluate 2 "spots " on his Rt neck & chest.   Medications   Current Outpatient Medications (Cardiovascular):  .  atorvastatin (LIPITOR) 80 MG tablet, Take 1 tablet Daily for Cholesterol .  isosorbide mononitrate (IMDUR) 30 MG 24 hr tablet, TAKE 1 TABLET(30 MG) BY MOUTH DAILY .  losartan (COZAAR) 50 MG tablet, Take 50 mg by mouth. .  metoprolol succinate (TOPROL-XL) 25 MG 24 hr tablet, Take 1 tablet by mouth daily. .  nitroGLYCERIN (NITROSTAT) 0.4 MG SL tablet, Place 1 tablet under the tongue every 5 (five) minutes as needed. For chest pain   Current Outpatient Medications (Analgesics):  .  acetaminophen (TYLENOL) 500 MG tablet, Take 500 mg by mouth every 6 (six) hours as needed. Marland Kitchen  aspirin EC 81 MG tablet, Take 1 tablet Daily .  traMADol (ULTRAM) 50 MG tablet, Take    1/2 to 1 tablet      every 4 hours      as needed for Pain  Current Outpatient Medications (Hematological):  .  vitamin B-12 (CYANOCOBALAMIN) 1000 MCG tablet, Take 1,000 mcg by mouth daily.  Current Outpatient Medications (Other):  Marland Kitchen  Cholecalciferol (VITAMIN D) 2000 units tablet, Take 8,000 Units by mouth every morning.  .  diazepam (VALIUM) 5 MG tablet, Take     1 tablet     3 x /day        for Hereditary tremor .  MAGNESIUM PO, Take by mouth. Takes magnesium 250 mg gummy twice a day. .  Minoxidil 5 % FOAM, Apply 1 application topically daily. .  Omega-3 Fatty Acids (FISH OIL) 1200 MG CPDR, Take 2 capsules by mouth at bedtime.  Marland Kitchen  oxybutynin (DITROPAN-XL) 10 MG 24 hr tablet, Take one tablet three times a day. .  pantoprazole (PROTONIX) 40 MG tablet, Take 40 mg by mouth daily. Marland Kitchen  triamcinolone ointment  (KENALOG) 0.1 %, Apply 1 application topically 3 (three) times daily.  Problem list He has Essential hypertension; Other abnormal glucose; Gout; Vitamin D deficiency; Hyperlipidemia; Essential tremor; Proteinuria; Medication management; Sick sinus syndrome (HCC); Overweight (BMI 25.0-29.9); Overactive bladder; S/P drug eluting coronary stent placement; CAD in native artery; Anxiety; Other abnormalities of gait and mobility; Depression due to physical illness; and S/P placement of cardiac pacemaker on their problem list.   Observations/Objective:  BP 110/66   Pulse 73   Temp 97.7 F (36.5 C)   Resp 16   Ht 6' 0.75" (1.848 m)   Wt 197 lb 3.2 oz (89.4 kg)   SpO2 99%   BMI 26.20 kg/m     Patient has a benign appearing 8 x 14 mm raised brown seborrheic keratoses of the Rt neck and a 12 mm & a non-tender sebaceous cyst of the Rt upper anterior lat chest .  Assessment and Plan:  1. Seborrheic keratosis   2. Sebaceous cyst   Follow Up Instructions:       Advised patient that excision is not recommended and he is in agreement.       I discussed the assessment and treatment plan with the patient. The patient was provided an opportunity to ask questions and all were answered. The patient agreed with  the plan and demonstrated an understanding of the instructions.       The patient was advised to call back or seek an in-person evaluation if the symptoms worsen or if the condition fails to improve as anticipated.   Marinus Maw, MD   (No Charge OV)

## 2021-01-11 DIAGNOSIS — I495 Sick sinus syndrome: Secondary | ICD-10-CM | POA: Diagnosis not present

## 2021-01-13 ENCOUNTER — Other Ambulatory Visit: Payer: Self-pay | Admitting: Internal Medicine

## 2021-01-13 DIAGNOSIS — E7800 Pure hypercholesterolemia, unspecified: Secondary | ICD-10-CM

## 2021-01-13 DIAGNOSIS — I2510 Atherosclerotic heart disease of native coronary artery without angina pectoris: Secondary | ICD-10-CM

## 2021-01-26 ENCOUNTER — Other Ambulatory Visit: Payer: Self-pay | Admitting: Internal Medicine

## 2021-02-01 ENCOUNTER — Other Ambulatory Visit: Payer: Self-pay | Admitting: Adult Health

## 2021-02-01 DIAGNOSIS — N3281 Overactive bladder: Secondary | ICD-10-CM

## 2021-02-16 DIAGNOSIS — K279 Peptic ulcer, site unspecified, unspecified as acute or chronic, without hemorrhage or perforation: Secondary | ICD-10-CM | POA: Diagnosis not present

## 2021-03-08 ENCOUNTER — Ambulatory Visit: Payer: Medicare Other | Admitting: Internal Medicine

## 2021-03-17 DIAGNOSIS — H25813 Combined forms of age-related cataract, bilateral: Secondary | ICD-10-CM | POA: Diagnosis not present

## 2021-03-17 DIAGNOSIS — H43393 Other vitreous opacities, bilateral: Secondary | ICD-10-CM | POA: Diagnosis not present

## 2021-03-17 DIAGNOSIS — H524 Presbyopia: Secondary | ICD-10-CM | POA: Diagnosis not present

## 2021-03-17 DIAGNOSIS — Z0100 Encounter for examination of eyes and vision without abnormal findings: Secondary | ICD-10-CM | POA: Diagnosis not present

## 2021-03-18 NOTE — Progress Notes (Signed)
MEDICARE ANNUAL WELLNESS VISIT AND FOLLOW UP Assessment:   Diagnoses and all orders for this visit:  [name] was seen today for follow-up and medicare wellness.  Diagnoses and all orders for this visit:  Encounter for Medicare annual wellness exam  Essential hypertension -     CBC with Differential/Platelet - - continue medications, DASH diet, exercise and monitor at home. Call if greater than 130/80.    Hyperlipidemia, mixed -     COMPLETE METABOLIC PANEL WITH GFR -     Lipid panel  Abnormal glucose -     COMPLETE METABOLIC PANEL WITH GFR  Chronic Renal Failure Stage 3A       - Push fluids, diet and exercise       - Glycemic and cholesterol control  Coronary artery disease involving native coronary artery of native heart, angina presence unspecified Control blood pressure, cholesterol, glucose, increase exercise.  Followed by cardiology Nitroglycerine if needed   S/P coronary artery stent placement x1 (2019) Continue BB, statin,  Followed by cardiology   S/P IVC filter Has follow up scheduled   DVT of axillary vein, acute bilateral (HCC) IVC filter in place.   Gastrointestinal hemorrhage associated with duodenal ulcer Avoid NSAID's Continue to monitor  Medication management -     CBC with Differential/Platelet -     COMPLETE METABOLIC PANEL WITH GFR -     Magnesium -     Lipid panel -     TSH  Essential tremor       - Continue Metoprolol  and valium and monitor symptoms  Screening for thyroid disorder -     TSH    Continue diet and meds as discussed. Further disposition pending results of labs. Over 40 minutes of face to face interview, exam, counseling, chart review, and critical decision making was performed.  Future Appointments  Date Time Provider Department Center  09/01/2021  3:00 PM Lucky Cowboy, MD [facility] None  03/24/2022 11:00 AM Revonda Humphrey, NP [facility] None     Plan:   During the course of the visit the patient was educated and  counseled about appropriate screening and preventive services including:   Pneumococcal vaccine  Hepatitis B vaccine Td vaccine Screening electrocardiogram Colorectal cancer screening Diabetes screening Glaucoma screening Nutrition counseling    Subjective:  Leroy Griffin is a 75 y.o. male who presents accompanied for Medicare Annual Wellness Visit and 6 month follow up for HTN, HLD, prediabetes, and vitamin D Def.   Patient had negative screening stress test and Cardiac MRI in Feb 2017 per Dr Graciela Husbands, however demonstrated unstable angina recently in 11/2017 and underwent diagnostic heart cath by Dr. Satira Sark. Cair which demonstrated high-grade stenosis of distal LAD (85-90%) and diagonal (90%) - stent was placed to LAD with balloon inflation of diagonal with improvement to 50%. He is on brilinta and bASA. He has follow up with Dr. Neale Burly and Dr. Graciela Husbands.   Has urinary frequency, nocturia x 5-8, very rare incontinence if he waits too long. Occ hesitancy, dribbling, weak stream. - has seen Dr. Annabell Howells in recent weeks, reports normal prostate, has small bladder and OAB, has been restarted on ER oxybutynin with "spotty" benefit per patient. Myrbetriq worked better but too expensive. Having very dry mouth with Oxybutynin as well as constipation.  Using Colace and Miralax for constipation  Tremor is controlled with use of Valium and Metorpolol.  BMI is Body mass index is 26.17 kg/m., he has been working on diet and exercise. Wife is  strictly watching his diet for heart healthy diet and portion control.  Wt Readings from Last 3 Encounters:  03/23/21 197 lb (89.4 kg)  12/15/20 197 lb 3.2 oz (89.4 kg)  08/18/20 196 lb 9.6 oz (89.2 kg)   His blood pressure has been controlled at home, today their BP is BP: 104/64 He denies chest pain, shortness of breath, dizziness.  He is on cholesterol medication (atorvastatin 40 mg daily) and denies myalgias. His cholesterol is at goal. The cholesterol last visit was:    Lab Results  Component Value Date   CHOL 115 08/18/2020   HDL 37 (L) 08/18/2020   LDLCALC 62 08/18/2020   TRIG 82 08/18/2020   CHOLHDL 3.1 08/18/2020   He has been working on diet and exercise for glucose management, and denies paresthesia of the feet, polydipsia and polyuria. Last A1C in the office was:  Lab Results  Component Value Date   HGBA1C 5.5 08/18/2020   Patient also has Idiopathic proteinuria & CKD2 followed by Dr Julien GirtKelli Goldsborough.  Lab Results  Component Value Date   GFRNONAA 64 08/18/2020   Patient is on Vitamin D supplement.   Lab Results  Component Value Date   VD25OH 74 08/18/2020       Medication Review: Current Outpatient Medications on File Prior to Visit  Medication Sig Dispense Refill   acetaminophen (TYLENOL) 500 MG tablet Take 500 mg by mouth every 6 (six) hours as needed.     aspirin EC 81 MG tablet Take 1 tablet Daily     atorvastatin (LIPITOR) 80 MG tablet Take  1 tablet  Daily  for Cholesterol 90 tablet 3   Cholecalciferol (VITAMIN D) 2000 units tablet Take 8,000 Units by mouth every morning.      diazepam (VALIUM) 5 MG tablet Take     1 tablet     3 x /day        for Hereditary tremor 270 tablet 0   isosorbide mononitrate (IMDUR) 30 MG 24 hr tablet TAKE 1 TABLET(30 MG) BY MOUTH DAILY     losartan (COZAAR) 50 MG tablet Take 50 mg by mouth.     MAGNESIUM PO Take by mouth. Takes magnesium 250 mg gummy twice a day.     metoprolol succinate (TOPROL-XL) 25 MG 24 hr tablet Take 1 tablet by mouth daily.     Minoxidil 5 % FOAM Apply 1 application topically daily.     nitroGLYCERIN (NITROSTAT) 0.4 MG SL tablet Place 1 tablet under the tongue every 5 (five) minutes as needed. For chest pain     Omega-3 Fatty Acids (FISH OIL) 1200 MG CPDR Take 2 capsules by mouth at bedtime.      oxybutynin (DITROPAN-XL) 10 MG 24 hr tablet TAKE 1 TABLET BY MOUTH THREE TIMES DAILY (Patient taking differently: TAKE 1 TABLET BY MOUTH TWO TIMES DAILY) 90 tablet 3    pantoprazole (PROTONIX) 40 MG tablet Take 40 mg by mouth daily.     traMADol (ULTRAM) 50 MG tablet Take  1/2 to 1 tablet  every 4 hours  Only  if Needed  for Severe Pain 30 tablet 0   triamcinolone ointment (KENALOG) 0.1 % Apply 1 application topically 3 (three) times daily. 80 g 3   vitamin B-12 (CYANOCOBALAMIN) 1000 MCG tablet Take 1,000 mcg by mouth daily.     No current facility-administered medications on file prior to visit.    Current Problems (verified) Patient Active Problem List   Diagnosis Date Noted   S/P placement  of cardiac pacemaker 11/18/2020   Other abnormalities of gait and mobility 01/17/2019   Depression due to physical illness 01/17/2019   Overactive bladder 01/11/2018   S/P drug eluting coronary stent placement 11/21/2017   CAD in native artery 11/20/2017   Anxiety 11/14/2017   Overweight (BMI 25.0-29.9) 08/26/2014   Sick sinus syndrome (HCC) 10/10/2013   Medication management 10/06/2013   Hyperlipidemia 08/27/2013   Essential tremor    Proteinuria    Other abnormal glucose    Gout    Vitamin D deficiency    Essential hypertension 10/10/2010    Screening Tests Immunization History  Administered Date(s) Administered   Influenza Split 05/23/2013   Influenza, High Dose Seasonal PF 05/14/2014, 06/11/2015, 05/19/2016, 06/10/2017, 05/20/2019   Influenza-Unspecified 04/22/2020   PFIZER(Purple Top)SARS-COV-2 Vaccination 10/11/2019, 11/01/2019, 06/10/2020   Pneumococcal Conjugate-13 12/03/2014   Pneumococcal Polysaccharide-23 06/01/2010   Td 06/01/2010   Tdap 01/28/2014   Zoster, Live 08/08/2004    Preventative care: Last colonoscopy: 2013 due 10 years EGD: 2019 Echo 08/2015 Stress test 09/2015 Cardiac cath 11/2017  Prior vaccinations: TD or Tdap: 2015 Influenza:04/22/20 Pneumococcal: 2011 Prevnar 2016 Shingles/Zostavax: 2006  SARS-COV2-Pfizer :  10/11/19, 11/01/19, 06/10/20  Names of Other Physician/Practitioners you currently use: 1. [facility] here for primary care 2. Dr. Neale Burly, [facility] eye doctor, last visit 03/2021 3. Dr. Pollyann Kennedy , dentist, last visit 04/2021 next appointment, q 6 months  Patient Care Team: Lucky Cowboy, MD as PCP - General (Internal Medicine) Sharyn Lull, Elvin So, MD as Referring Physician (Dermatology) Mittie Bodo, MD as Referring Physician (Cardiology) Annie Sable, MD as Consulting Physician (Nephrology) Marney Setting, MD as Referring Physician (Gastroenterology)   Allergies Allergies  Allergen Reactions   Enalapril Rash   Nsaids     Internal bleeding   Welchol [Colesevelam Hcl] Diarrhea   Vasotec [Enalaprilat] Rash    SURGICAL HISTORY He  has a past surgical history that includes Knee surgery (Right); Tonsillectomy; Cyst excision; and Cholecystectomy. FAMILY HISTORY His family history includes Dementia in his mother; Emphysema in his brother; Heart disease in his brother; Stroke in his father. SOCIAL HISTORY He  reports that he has never smoked. He has never used smokeless tobacco. He reports that he does not currently use alcohol after a past usage of about 14.0 standard drinks per week. He reports that he does not use drugs.  MEDICARE WELLNESS OBJECTIVES: Physical activity: Current Exercise Habits: The patient does not participate in regular exercise at present, Exercise limited by: orthopedic condition(s);cardiac condition(s) Cardiac risk factors: Cardiac Risk Factors include: advanced age (>4men, >4 women);dyslipidemia;male gender;hypertension Depression/mood screen:   Depression screen [facility] 2/9 03/23/2021  Decreased Interest 0  Down, Depressed, Hopeless 0  PHQ - 2 Score 0  Altered sleeping -  Tired, decreased energy -  Change in appetite -  Feeling bad or failure about yourself  -  Trouble concentrating -  Moving slowly or fidgety/restless -  Suicidal thoughts -  PHQ-9 Score -  Difficult doing work/chores -     ADLs:  In your present state of health, do you have any difficulty performing the following activities: 03/23/2021 08/17/2020  Hearing? Y N  Comment hearing test 2 months ago, some hearing loss but no hearing loss -  Vision? N N  Difficulty concentrating or making decisions? N N  Walking or climbing stairs? N N  Dressing or bathing? N N  Doing errands, shopping? N N  Some recent data might be hidden  Cognitive Testing  Alert? Yes  Normal Appearance?Yes  Oriented to person? Yes  Place? Yes   Time? Yes  Recall of three objects?  Yes  Can perform simple calculations? Yes  Displays appropriate judgment?Yes  Can read the correct time from a watch face?Yes  EOL planning: Does Patient Have a Medical Advance Directive?: Yes Type of Advance Directive: Living will, Healthcare Power of Attorney Does patient want to make changes to medical advance directive?: No - Patient declined Copy of Healthcare Power of Attorney in Chart?: No - copy requested    Objective:   Blood pressure 104/64, pulse 71, temperature (!) 97.5 F (36.4 C), weight 197 lb (89.4 kg), SpO2 96 %. Body mass index is 26.17 kg/m.  General appearance: alert, no distress, WD/WN, male, patient wearing mask. HEENT: normocephalic, sclerae anicteric, TMs pearly, nares patent, no discharge or erythema, pharynx normal Oral cavity: MMM, no lesions Neck: supple, no lymphadenopathy, no thyromegaly, no masses Heart: RRR, normal S1, S2, no murmurs Lungs: CTA bilaterally, no wheezes, rhonchi, or rales Abdomen: +bs, soft, non tender, non distended, no masses, no hepatomegaly, no splenomegaly Musculoskeletal: nontender, no swelling, no obvious deformity Extremities: no edema, no cyanosis, no clubbing Pulses: 2+ symmetric, upper and lower extremities, normal cap refill Neurological: alert, oriented x 3, CN2-12 intact, strength normal upper extremities and lower extremities, , mild tremor of the right hand present, sensation normal  throughout, DTRs 2+ throughout, no cerebellar signs, shuffling bent over gait with walker Psychiatric: normal affect, behavior normal, pleasant   Medicare Attestation I have personally reviewed: The patient's medical and social history Their use of alcohol, tobacco or illicit drugs Their current medications and supplements The patient's functional ability including ADLs,fall risks, home safety risks, cognitive, and hearing and visual impairment Diet and physical activities Evidence for depression or mood disorders  The patient's weight, height, BMI, and visual acuity have been recorded in the chart.  I have made referrals, counseling, and provided education to the patient based on review of the above and I have provided the patient with a written personalized care plan for preventive services.    Revonda Humphrey ANP-C  [facility]  03/23/2021

## 2021-03-23 ENCOUNTER — Ambulatory Visit (INDEPENDENT_AMBULATORY_CARE_PROVIDER_SITE_OTHER): Payer: Medicare Other | Admitting: Nurse Practitioner

## 2021-03-23 ENCOUNTER — Encounter: Payer: Self-pay | Admitting: Nurse Practitioner

## 2021-03-23 ENCOUNTER — Other Ambulatory Visit: Payer: Self-pay

## 2021-03-23 VITALS — BP 104/64 | HR 71 | Temp 97.5°F | Wt 197.0 lb

## 2021-03-23 DIAGNOSIS — R7309 Other abnormal glucose: Secondary | ICD-10-CM | POA: Diagnosis not present

## 2021-03-23 DIAGNOSIS — I495 Sick sinus syndrome: Secondary | ICD-10-CM | POA: Diagnosis not present

## 2021-03-23 DIAGNOSIS — M1000 Idiopathic gout, unspecified site: Secondary | ICD-10-CM

## 2021-03-23 DIAGNOSIS — Z1329 Encounter for screening for other suspected endocrine disorder: Secondary | ICD-10-CM

## 2021-03-23 DIAGNOSIS — N401 Enlarged prostate with lower urinary tract symptoms: Secondary | ICD-10-CM | POA: Diagnosis not present

## 2021-03-23 DIAGNOSIS — I10 Essential (primary) hypertension: Secondary | ICD-10-CM | POA: Diagnosis not present

## 2021-03-23 DIAGNOSIS — G250 Essential tremor: Secondary | ICD-10-CM

## 2021-03-23 DIAGNOSIS — Z79899 Other long term (current) drug therapy: Secondary | ICD-10-CM

## 2021-03-23 DIAGNOSIS — Z0001 Encounter for general adult medical examination with abnormal findings: Secondary | ICD-10-CM | POA: Diagnosis not present

## 2021-03-23 DIAGNOSIS — Z950 Presence of cardiac pacemaker: Secondary | ICD-10-CM

## 2021-03-23 DIAGNOSIS — R6889 Other general symptoms and signs: Secondary | ICD-10-CM | POA: Diagnosis not present

## 2021-03-23 DIAGNOSIS — E559 Vitamin D deficiency, unspecified: Secondary | ICD-10-CM

## 2021-03-23 DIAGNOSIS — N1831 Chronic kidney disease, stage 3a: Secondary | ICD-10-CM

## 2021-03-23 DIAGNOSIS — E782 Mixed hyperlipidemia: Secondary | ICD-10-CM

## 2021-03-23 DIAGNOSIS — I2510 Atherosclerotic heart disease of native coronary artery without angina pectoris: Secondary | ICD-10-CM

## 2021-03-23 DIAGNOSIS — Z0000 Encounter for general adult medical examination without abnormal findings: Secondary | ICD-10-CM

## 2021-03-23 DIAGNOSIS — N138 Other obstructive and reflux uropathy: Secondary | ICD-10-CM

## 2021-03-23 NOTE — Patient Instructions (Signed)

## 2021-03-24 LAB — CBC WITH DIFFERENTIAL/PLATELET
Absolute Monocytes: 340 cells/uL (ref 200–950)
Basophils Absolute: 39 cells/uL (ref 0–200)
Basophils Relative: 0.9 %
Eosinophils Absolute: 159 cells/uL (ref 15–500)
Eosinophils Relative: 3.7 %
HCT: 42.7 % (ref 38.5–50.0)
Hemoglobin: 14.4 g/dL (ref 13.2–17.1)
Lymphs Abs: 1630 cells/uL (ref 850–3900)
MCH: 32.7 pg (ref 27.0–33.0)
MCHC: 33.7 g/dL (ref 32.0–36.0)
MCV: 96.8 fL (ref 80.0–100.0)
MPV: 10.2 fL (ref 7.5–12.5)
Monocytes Relative: 7.9 %
Neutro Abs: 2133 cells/uL (ref 1500–7800)
Neutrophils Relative %: 49.6 %
Platelets: 135 10*3/uL — ABNORMAL LOW (ref 140–400)
RBC: 4.41 10*6/uL (ref 4.20–5.80)
RDW: 13.4 % (ref 11.0–15.0)
Total Lymphocyte: 37.9 %
WBC: 4.3 10*3/uL (ref 3.8–10.8)

## 2021-03-24 LAB — LIPID PANEL
Cholesterol: 121 mg/dL (ref <200)
HDL: 33 mg/dL — ABNORMAL LOW (ref >=40)
LDL Cholesterol (Calc): 69 mg/dL (calc)
Non-HDL Cholesterol (Calc): 88 mg/dL (calc) (ref <130)
Total CHOL/HDL Ratio: 3.7 (calc) (ref <5.0)
Triglycerides: 111 mg/dL (ref <150)

## 2021-03-24 LAB — TSH: TSH: 1.75 mIU/L (ref 0.40–4.50)

## 2021-03-24 LAB — COMPLETE METABOLIC PANEL WITH GFR
AG Ratio: 1.6 (calc) (ref 1.0–2.5)
ALT: 14 U/L (ref 9–46)
AST: 16 U/L (ref 10–35)
Albumin: 4.1 g/dL (ref 3.6–5.1)
Alkaline phosphatase (APISO): 54 U/L (ref 35–144)
BUN/Creatinine Ratio: 22 (calc) (ref 6–22)
BUN: 27 mg/dL — ABNORMAL HIGH (ref 7–25)
CO2: 31 mmol/L (ref 20–32)
Calcium: 9.8 mg/dL (ref 8.6–10.3)
Chloride: 104 mmol/L (ref 98–110)
Creat: 1.23 mg/dL (ref 0.70–1.28)
Globulin: 2.5 g/dL (calc) (ref 1.9–3.7)
Glucose, Bld: 79 mg/dL (ref 65–99)
Potassium: 4.4 mmol/L (ref 3.5–5.3)
Sodium: 140 mmol/L (ref 135–146)
Total Bilirubin: 0.7 mg/dL (ref 0.2–1.2)
Total Protein: 6.6 g/dL (ref 6.1–8.1)
eGFR: 60 mL/min/{1.73_m2} (ref >=60)

## 2021-03-24 LAB — MAGNESIUM: Magnesium: 1.8 mg/dL (ref 1.5–2.5)

## 2021-03-29 DIAGNOSIS — L639 Alopecia areata, unspecified: Secondary | ICD-10-CM | POA: Diagnosis not present

## 2021-04-12 DIAGNOSIS — I495 Sick sinus syndrome: Secondary | ICD-10-CM | POA: Diagnosis not present

## 2021-04-20 DIAGNOSIS — E669 Obesity, unspecified: Secondary | ICD-10-CM | POA: Diagnosis not present

## 2021-04-20 DIAGNOSIS — I2510 Atherosclerotic heart disease of native coronary artery without angina pectoris: Secondary | ICD-10-CM | POA: Diagnosis not present

## 2021-04-20 DIAGNOSIS — I129 Hypertensive chronic kidney disease with stage 1 through stage 4 chronic kidney disease, or unspecified chronic kidney disease: Secondary | ICD-10-CM | POA: Diagnosis not present

## 2021-04-20 DIAGNOSIS — Z23 Encounter for immunization: Secondary | ICD-10-CM | POA: Diagnosis not present

## 2021-04-20 DIAGNOSIS — E785 Hyperlipidemia, unspecified: Secondary | ICD-10-CM | POA: Diagnosis not present

## 2021-04-20 DIAGNOSIS — R351 Nocturia: Secondary | ICD-10-CM | POA: Diagnosis not present

## 2021-04-20 DIAGNOSIS — R809 Proteinuria, unspecified: Secondary | ICD-10-CM | POA: Diagnosis not present

## 2021-04-22 DIAGNOSIS — R809 Proteinuria, unspecified: Secondary | ICD-10-CM | POA: Diagnosis not present

## 2021-05-18 DIAGNOSIS — Z8719 Personal history of other diseases of the digestive system: Secondary | ICD-10-CM | POA: Diagnosis not present

## 2021-05-18 DIAGNOSIS — Z955 Presence of coronary angioplasty implant and graft: Secondary | ICD-10-CM | POA: Diagnosis not present

## 2021-05-18 DIAGNOSIS — Z950 Presence of cardiac pacemaker: Secondary | ICD-10-CM | POA: Diagnosis not present

## 2021-05-18 DIAGNOSIS — I2510 Atherosclerotic heart disease of native coronary artery without angina pectoris: Secondary | ICD-10-CM | POA: Diagnosis not present

## 2021-05-18 DIAGNOSIS — I495 Sick sinus syndrome: Secondary | ICD-10-CM | POA: Diagnosis not present

## 2021-05-18 DIAGNOSIS — R001 Bradycardia, unspecified: Secondary | ICD-10-CM | POA: Diagnosis not present

## 2021-05-18 DIAGNOSIS — I480 Paroxysmal atrial fibrillation: Secondary | ICD-10-CM | POA: Diagnosis not present

## 2021-05-18 DIAGNOSIS — E785 Hyperlipidemia, unspecified: Secondary | ICD-10-CM | POA: Diagnosis not present

## 2021-05-18 DIAGNOSIS — I10 Essential (primary) hypertension: Secondary | ICD-10-CM | POA: Diagnosis not present

## 2021-05-25 DIAGNOSIS — Z23 Encounter for immunization: Secondary | ICD-10-CM | POA: Diagnosis not present

## 2021-05-25 DIAGNOSIS — L639 Alopecia areata, unspecified: Secondary | ICD-10-CM | POA: Diagnosis not present

## 2021-05-27 ENCOUNTER — Telehealth: Payer: Self-pay

## 2021-05-27 ENCOUNTER — Other Ambulatory Visit: Payer: Self-pay | Admitting: Internal Medicine

## 2021-05-27 MED ORDER — TRAMADOL HCL 50 MG PO TABS: ORAL_TABLET | ORAL | 0 refills | Status: DC

## 2021-05-27 NOTE — Telephone Encounter (Signed)
Patient requesting a refill on Tramadol 50mg    [facility]

## 2021-05-27 NOTE — Telephone Encounter (Signed)
Send to Dr. Oneta Rack

## 2021-06-02 ENCOUNTER — Other Ambulatory Visit: Payer: Self-pay | Admitting: Internal Medicine

## 2021-06-02 DIAGNOSIS — G250 Essential tremor: Secondary | ICD-10-CM

## 2021-06-02 MED ORDER — DIAZEPAM 5 MG PO TABS: ORAL_TABLET | ORAL | 0 refills | Status: DC

## 2021-06-11 DIAGNOSIS — I480 Paroxysmal atrial fibrillation: Secondary | ICD-10-CM | POA: Diagnosis not present

## 2021-06-11 DIAGNOSIS — Z950 Presence of cardiac pacemaker: Secondary | ICD-10-CM | POA: Diagnosis not present

## 2021-06-11 DIAGNOSIS — R001 Bradycardia, unspecified: Secondary | ICD-10-CM | POA: Diagnosis not present

## 2021-06-11 DIAGNOSIS — I2510 Atherosclerotic heart disease of native coronary artery without angina pectoris: Secondary | ICD-10-CM | POA: Diagnosis not present

## 2021-06-11 DIAGNOSIS — R0602 Shortness of breath: Secondary | ICD-10-CM | POA: Diagnosis not present

## 2021-06-11 DIAGNOSIS — Z955 Presence of coronary angioplasty implant and graft: Secondary | ICD-10-CM | POA: Diagnosis not present

## 2021-06-11 DIAGNOSIS — I495 Sick sinus syndrome: Secondary | ICD-10-CM | POA: Diagnosis not present

## 2021-06-16 ENCOUNTER — Other Ambulatory Visit: Payer: Self-pay | Admitting: Nurse Practitioner

## 2021-06-16 DIAGNOSIS — N3281 Overactive bladder: Secondary | ICD-10-CM

## 2021-06-16 MED ORDER — OXYBUTYNIN CHLORIDE ER 10 MG PO TB24: ORAL_TABLET | ORAL | 3 refills | Status: DC

## 2021-07-27 DIAGNOSIS — I495 Sick sinus syndrome: Secondary | ICD-10-CM | POA: Diagnosis not present

## 2021-08-23 DIAGNOSIS — I495 Sick sinus syndrome: Secondary | ICD-10-CM | POA: Diagnosis not present

## 2021-09-01 ENCOUNTER — Ambulatory Visit (INDEPENDENT_AMBULATORY_CARE_PROVIDER_SITE_OTHER): Payer: Medicare Other | Admitting: Internal Medicine

## 2021-09-01 ENCOUNTER — Encounter: Payer: Self-pay | Admitting: Internal Medicine

## 2021-09-01 ENCOUNTER — Other Ambulatory Visit: Payer: Self-pay

## 2021-09-01 VITALS — BP 124/78 | HR 86 | Temp 97.9°F | Resp 16 | Ht 72.75 in | Wt 198.0 lb

## 2021-09-01 DIAGNOSIS — Z79899 Other long term (current) drug therapy: Secondary | ICD-10-CM

## 2021-09-01 DIAGNOSIS — I495 Sick sinus syndrome: Secondary | ICD-10-CM | POA: Diagnosis not present

## 2021-09-01 DIAGNOSIS — N138 Other obstructive and reflux uropathy: Secondary | ICD-10-CM | POA: Diagnosis not present

## 2021-09-01 DIAGNOSIS — I2510 Atherosclerotic heart disease of native coronary artery without angina pectoris: Secondary | ICD-10-CM | POA: Diagnosis not present

## 2021-09-01 DIAGNOSIS — Z8249 Family history of ischemic heart disease and other diseases of the circulatory system: Secondary | ICD-10-CM

## 2021-09-01 DIAGNOSIS — M1000 Idiopathic gout, unspecified site: Secondary | ICD-10-CM | POA: Diagnosis not present

## 2021-09-01 DIAGNOSIS — I10 Essential (primary) hypertension: Secondary | ICD-10-CM

## 2021-09-01 DIAGNOSIS — R7309 Other abnormal glucose: Secondary | ICD-10-CM

## 2021-09-01 DIAGNOSIS — N401 Enlarged prostate with lower urinary tract symptoms: Secondary | ICD-10-CM | POA: Diagnosis not present

## 2021-09-01 DIAGNOSIS — Z136 Encounter for screening for cardiovascular disorders: Secondary | ICD-10-CM

## 2021-09-01 DIAGNOSIS — E782 Mixed hyperlipidemia: Secondary | ICD-10-CM | POA: Diagnosis not present

## 2021-09-01 DIAGNOSIS — E559 Vitamin D deficiency, unspecified: Secondary | ICD-10-CM

## 2021-09-01 DIAGNOSIS — Z955 Presence of coronary angioplasty implant and graft: Secondary | ICD-10-CM

## 2021-09-01 DIAGNOSIS — Z950 Presence of cardiac pacemaker: Secondary | ICD-10-CM

## 2021-09-01 DIAGNOSIS — Z125 Encounter for screening for malignant neoplasm of prostate: Secondary | ICD-10-CM | POA: Diagnosis not present

## 2021-09-01 DIAGNOSIS — Z1212 Encounter for screening for malignant neoplasm of rectum: Secondary | ICD-10-CM

## 2021-09-01 NOTE — Patient Instructions (Signed)

## 2021-09-01 NOTE — Progress Notes (Signed)
Comprehensive Evaluation & Examination  Future Appointments  Date Time Provider Department  09/01/2021  3:00 PM Unk Pinto, MD [facility]  03/24/2022 11:00 AM Magda Bernheim, NP [facility]  09/06/2022  3:00 PM Unk Pinto, MD [facility]            This very nice 76 y.o. MWM presents for a comprehensive evaluation and management of multiple medical co-morbidities.  Patient has been followed for HTN, HLD, Prediabetes and Vitamin D Deficiency. Patient is on meds for GERD & Gout .        HTN predates circa 1995.  In 2019, patient presented with ACS and underwent  PCA w/Stent and also had pAfib. In Feb 2021 , he had PPM implanted for SSS. Patient has CKD3a (GFR 56) felt consequent of his HTVD.  He is followed by Dr Corliss Parish for his kidney dsease. Patient's BP has been controlled at home.  Today's BP is at goal - 124/78. Patient denies any cardiac symptoms as chest pain, palpitations, shortness of breath, dizziness or ankle swelling.       Patient's hyperlipidemia is controlled with diet and medications. Patient denies myalgias or other medication SE's. Last lipids were at goal :  Lab Results  Component Value Date   CHOL 121 03/23/2021   HDL 33 (L) 03/23/2021   LDLCALC 69 03/23/2021   TRIG 111 03/23/2021   CHOLHDL 3.7 03/23/2021         Patient is monitored for glucose intolerance  and patient denies reactive hypoglycemic symptoms, visual blurring, diabetic polys or paresthesias. Last A1c was normal & at goal :   Lab Results  Component Value Date   HGBA1C 5.5 08/18/2020          Finally, patient has history of Vitamin D Deficiency of    and last vitamin D was at goal :   Lab Results  Component Value Date   VD25OH 74 08/18/2020     Current Outpatient Medications on File Prior to Visit  Medication Sig   acetaminophen (TYLENOL) 500 MG tablet Take 500 mg every 6 (six) hours as needed.   aspirin EC 81 MG tablet Take 1 tablet Daily   atorvastatin (LIPITOR) 80  MG tablet Take  1 tablet  Daily  for Cholesterol   Cholecalciferol (VITAMIN D) 2000 units tablet Take 8,000 Units every morning.    diazepam (VALIUM) 5 MG tablet Take  1 tablet  3 x /day  for Hereditary Tremor   isosorbide mononitrate (IMDUR) 30 MG 24 hr tablet TAKE 1 TABLET  DAILY   losartan 50 MG tablet Take 50 mg by mouth.   MAGNESIUM PO Take by mouth. Takes magnesium 250 mg gummy twice a day.   metoprolol succinate-XL) 25 MG 24 hr tablet Take 1 tablet daily.   Minoxidil 5 % FOAM Apply 1 application topically daily.   NITROSTAT 0.4 MG SL tablet as needed. For chest pain   Omega-3 FISH OIL 1200 MG  Take 2 capsules  at bedtime.    DITROPAN-XL 10 MG 24 hr tablet TAKE 1 TABLET TWO TIMES DAILY   pantoprazole 40 MG tablet Take daily.   traMADol  50 MG tablet Take  1/2 to 1 tablet  every 4 hours  Only  if Needed  for Severe Pain   triamcinolone ointment ( 0.1 % Apply 3 times daily.   vitamin B-12  1000 MCG tablet Take  daily.     Allergies  Allergen Reactions   Enalapril Rash   Nsaids  Internal bleeding   Welchol [Colesevelam Hcl] Diarrhea   Vasotec [Enalaprilat] Rash     Past Medical History:  Diagnosis Date   CAD (coronary artery disease)    Essential tremor    Gastric ulcer 01/14/2015   Gout    Hyperlipidemia    Hypertension    Parkinson's disease ([place])    Prediabetes    Proteinuria    Vitamin D deficiency      Health Maintenance  Topic Date Due   Zoster Vaccines- Shingrix (1 of 2) Never done   COVID-19 Vaccine (4 - Booster for Pfizer series) 08/05/2020   Hepatitis C Screening  03/23/2022 (Originally 03/09/1964)   TETANUS/TDAP  01/29/2024   Pneumonia Vaccine  Completed   INFLUENZA VACCINE  Completed   HPV VACCINES  Aged Out   COLONOSCOPY  Discontinued     Immunization History  Administered Date(s) Administered   Influenza Split 05/23/2013   Influenza, High Dose 05/19/2016, 06/10/2017, 05/20/2019   Influenza 04/22/2020   PFIZER SARS-COV-2 Vacc 10/11/2019,  11/01/2019, 06/10/2020   Pneumococcal -13 12/03/2014   Pneumococcal -23 06/01/2010   Td 06/01/2010   Tdap 01/28/2014   Zoster, Live 08/08/2004    Last Colon -  67/28/2013 Dr Arty Baumgartner   Past Surgical History:  Procedure Laterality Date   CHOLECYSTECTOMY     CYST EXCISION     back   KNEE SURGERY Right    TONSILLECTOMY       Social History   Tobacco Use   Smoking status: Never   Smokeless tobacco: Never  Vaping Use   Vaping Use: Never used  Substance Use Topics   Alcohol use: Not Currently    Alcohol/week: 14.0 standard drinks    Types: 14 Standard drinks or equivalent per week   Drug use: No      ROS Constitutional: Denies fever, chills, weight loss/gain, headaches, insomnia,  night sweats or change in appetite. Does c/o fatigue. Eyes: Denies redness, blurred vision, diplopia, discharge, itchy or watery eyes.  ENT: Denies discharge, congestion, post nasal drip, epistaxis, sore throat, earache, hearing loss, dental pain, Tinnitus, Vertigo, Sinus pain or snoring.  Cardio: Denies chest pain, palpitations, irregular heartbeat, syncope, dyspnea, diaphoresis, orthopnea, PND, claudication or edema Respiratory: denies cough, dyspnea, DOE, pleurisy, hoarseness, laryngitis or wheezing.  Gastrointestinal: Denies dysphagia, heartburn, reflux, water brash, pain, cramps, nausea, vomiting, bloating, diarrhea, constipation, hematemesis, melena, hematochezia, jaundice or hemorrhoids Genitourinary: Denies dysuria, frequency, urgency, nocturia, hesitancy, discharge, hematuria or flank pain Musculoskeletal: Denies arthralgia, myalgia, stiffness, Jt. Swelling, pain, limp or strain/sprain. Denies Falls. Skin: Denies puritis, rash, hives, warts, acne, eczema or change in skin lesion Neuro: No weakness, tremor, incoordination, spasms, paresthesia or pain Psychiatric: Denies confusion, memory loss or sensory loss. Denies Depression. Endocrine: Denies change in weight, skin, hair change,  nocturia, and paresthesia, diabetic polys, visual blurring or hyper / hypo glycemic episodes.  Heme/Lymph: No excessive bleeding, bruising or enlarged lymph nodes.   Physical Exam  BP 124/78    Pulse 86    Temp 97.9 F (36.6 C)    Resp 16    Ht 6' 0.75" (1.848 m)    Wt 198 lb (89.8 kg)    SpO2 99%    BMI 26.30 kg/m   General Appearance: Well nourished and well groomed and in no apparent distress.  Eyes: PERRLA, EOMs, conjunctiva no swelling or erythema, normal fundi and vessels. Sinuses: No frontal/maxillary tenderness ENT/Mouth: EACs patent / TMs  nl. Nares clear without erythema, swelling, mucoid exudates. Oral hygiene is  good. No erythema, swelling, or exudate. Tongue normal, non-obstructing. Tonsils not swollen or erythematous. Hearing normal.  Neck: Supple, thyroid not palpable. No bruits, nodes or JVD. Respiratory: Respiratory effort normal.  BS equal and clear bilateral without rales, rhonci, wheezing or stridor. Cardio: Heart sounds are normal with regular rate and rhythm and no murmurs, rubs or gallops. Peripheral pulses are normal and equal bilaterally without edema. No aortic or femoral bruits. Chest: symmetric with normal excursions and percussion.  Abdomen: Soft, with Nl bowel sounds. Nontender, no guarding, rebound, hernias, masses, or organomegaly.  Lymphatics: Non tender without lymphadenopathy.  Musculoskeletal: Full ROM all peripheral extremities, joint stability, 5/5 strength, and normal gait. Skin: Warm and dry without rashes, lesions, cyanosis, clubbing or  ecchymosis.  Neuro: Cranial nerves intact, reflexes equal bilaterally. Normal muscle tone, no cerebellar symptoms. Sensation intact.  Pysch: Alert and oriented X 3 with normal affect, insight and judgment appropriate.   Assessment and Plan  1. Essential hypertension  - EKG 12-Lead - Urinalysis, Routine w reflex microscopic - Microalbumin / creatinine urine ratio - CBC with Differential/Platelet - COMPLETE  METABOLIC PANEL WITH GFR - Magnesium - TSH  2. Hyperlipidemia, mixed  - EKG 12-Lead - Lipid panel - TSH  3. Abnormal glucose  - EKG 12-Lead - Hemoglobin A1c - Insulin, random  4. Vitamin D deficiency  - VITAMIN D 25 Hydroxy   5. SSS (sick sinus syndrome) (HCC)  - EKG 12-Lead  6. Presence of permanent cardiac pacemaker  - EKG 12-Lead  7. Coronary artery disease involving native coronary  artery of native heart without angina pectoris  - EKG 12-Lead  8. S/P coronary artery stent placement  - EKG 12-Lead  9. Idiopathic gout  - Uric acid  10. BPH with obstruction/lower urinary tract symptoms  - PSA  11. Prostate cancer screening  - PSA  12. Screening for colorectal cancer  - POC Hemoccult Bld/Stl   13. Screening for ischemic heart disease  - EKG 12-Lead  14. FHx: heart disease   15. Screening for AAA (aortic abdominal aneurysm)   16. Medication management  - Urinalysis, Routine w reflex microscopic - Microalbumin / creatinine urine ratio - Uric acid - PTH, intact and calcium - CBC with Differential/Platelet - COMPLETE METABOLIC PANEL WITH GFR - Magnesium - Lipid panel - TSH - Hemoglobin A1c - Insulin, random - VITAMIN D 25 Hydroxy         Patient was counseled in prudent diet, weight control to achieve/maintain BMI less than 25, BP monitoring, regular exercise and medications as discussed.  Discussed med effects and SE's. Routine screening labs and tests as requested with regular follow-up as recommended. Over 40 minutes of exam, counseling, chart review and high complex critical decision making was performed   Kirtland Bouchard, MD

## 2021-09-02 LAB — COMPLETE METABOLIC PANEL WITH GFR
AG Ratio: 1.8 (calc) (ref 1.0–2.5)
ALT: 16 U/L (ref 9–46)
AST: 20 U/L (ref 10–35)
Albumin: 4.1 g/dL (ref 3.6–5.1)
Alkaline phosphatase (APISO): 52 U/L (ref 35–144)
BUN/Creatinine Ratio: 21 (calc) (ref 6–22)
BUN: 29 mg/dL — ABNORMAL HIGH (ref 7–25)
CO2: 32 mmol/L (ref 20–32)
Calcium: 10 mg/dL (ref 8.6–10.3)
Chloride: 105 mmol/L (ref 98–110)
Creat: 1.38 mg/dL — ABNORMAL HIGH (ref 0.70–1.28)
Globulin: 2.3 g/dL (calc) (ref 1.9–3.7)
Glucose, Bld: 89 mg/dL (ref 65–99)
Potassium: 5.4 mmol/L — ABNORMAL HIGH (ref 3.5–5.3)
Sodium: 141 mmol/L (ref 135–146)
Total Bilirubin: 0.7 mg/dL (ref 0.2–1.2)
Total Protein: 6.4 g/dL (ref 6.1–8.1)
eGFR: 53 mL/min/{1.73_m2} — ABNORMAL LOW (ref >=60)

## 2021-09-02 LAB — URINALYSIS, ROUTINE W REFLEX MICROSCOPIC
Bacteria, UA: NONE SEEN /HPF
Bilirubin Urine: NEGATIVE
Glucose, UA: NEGATIVE
Hgb urine dipstick: NEGATIVE
Hyaline Cast: NONE SEEN /LPF
Ketones, ur: NEGATIVE
Leukocytes,Ua: NEGATIVE
Nitrite: NEGATIVE
RBC / HPF: NONE SEEN /HPF (ref 0–2)
Specific Gravity, Urine: 1.02 (ref 1.001–1.035)
Squamous Epithelial / HPF: NONE SEEN /HPF (ref <=5)
WBC, UA: NONE SEEN /HPF (ref 0–5)
pH: 5.5 (ref 5.0–8.0)

## 2021-09-02 LAB — CBC WITH DIFFERENTIAL/PLATELET
Absolute Monocytes: 380 cells/uL (ref 200–950)
Basophils Absolute: 42 cells/uL (ref 0–200)
Basophils Relative: 0.8 %
Eosinophils Absolute: 88 cells/uL (ref 15–500)
Eosinophils Relative: 1.7 %
HCT: 41.5 % (ref 38.5–50.0)
Hemoglobin: 14.1 g/dL (ref 13.2–17.1)
Lymphs Abs: 1664 cells/uL (ref 850–3900)
MCH: 33.6 pg — ABNORMAL HIGH (ref 27.0–33.0)
MCHC: 34 g/dL (ref 32.0–36.0)
MCV: 98.8 fL (ref 80.0–100.0)
MPV: 9.8 fL (ref 7.5–12.5)
Monocytes Relative: 7.3 %
Neutro Abs: 3026 cells/uL (ref 1500–7800)
Neutrophils Relative %: 58.2 %
Platelets: 149 10*3/uL (ref 140–400)
RBC: 4.2 10*6/uL (ref 4.20–5.80)
RDW: 12.7 % (ref 11.0–15.0)
Total Lymphocyte: 32 %
WBC: 5.2 10*3/uL (ref 3.8–10.8)

## 2021-09-02 LAB — HEMOGLOBIN A1C
Hgb A1c MFr Bld: 5.4 % of total Hgb (ref <5.7)
Mean Plasma Glucose: 108 mg/dL
eAG (mmol/L): 6 mmol/L

## 2021-09-02 LAB — TSH: TSH: 1.17 mIU/L (ref 0.40–4.50)

## 2021-09-02 LAB — MICROALBUMIN / CREATININE URINE RATIO
Creatinine, Urine: 112 mg/dL (ref 20–320)
Microalb Creat Ratio: 1238 mcg/mg creat — ABNORMAL HIGH (ref <30)
Microalb, Ur: 138.6 mg/dL

## 2021-09-02 LAB — LIPID PANEL
Cholesterol: 113 mg/dL (ref <200)
HDL: 31 mg/dL — ABNORMAL LOW (ref >=40)
LDL Cholesterol (Calc): 53 mg/dL (calc)
Non-HDL Cholesterol (Calc): 82 mg/dL (calc) (ref <130)
Total CHOL/HDL Ratio: 3.6 (calc) (ref <5.0)
Triglycerides: 230 mg/dL — ABNORMAL HIGH (ref <150)

## 2021-09-02 LAB — VITAMIN D 25 HYDROXY (VIT D DEFICIENCY, FRACTURES): Vit D, 25-Hydroxy: 74 ng/mL (ref 30–100)

## 2021-09-02 LAB — PSA: PSA: 0.79 ng/mL (ref <=4.00)

## 2021-09-02 LAB — MAGNESIUM: Magnesium: 1.8 mg/dL (ref 1.5–2.5)

## 2021-09-02 LAB — PTH, INTACT AND CALCIUM
Calcium: 10 mg/dL (ref 8.6–10.3)
PTH: 47 pg/mL (ref 16–77)

## 2021-09-02 LAB — URIC ACID: Uric Acid, Serum: 6.8 mg/dL (ref 4.0–8.0)

## 2021-09-02 LAB — INSULIN, RANDOM: Insulin: 9.7 u[IU]/mL

## 2021-09-02 NOTE — Progress Notes (Signed)
============================================================ - Test results slightly outside the reference range are not unusual. If there is anything important, I will review this with you,  otherwise it is considered normal test values.  If you have further questions,  please do not hesitate to contact me at the office or via My Chart.  ============================================================ ============================================================  -  Kidney functions show slight increase in Creat  from 1.23 to now 1.38    (  Will share copy of report with Dr Moshe Cipro )   - And also K+  (= potassium) is elevated 5.4% - So Avoid and extra potassium sources as "Morton's Lite Salt" or  " No Salt"  ============================================================ ============================================================  -  Total  Chol =  113     -   Great  !            (  Ideal  or  Goal is less than 180  !  )   - and   -  Bad / Dangerous LDL  Chol =  53 -    - Both  Excellent                       (  Ideal  or  Goal is less than 70  !  )  - Very low risk for Heart Attack  / Stroke ============================================================ ============================================================  -  But . . . . . . .   -Triglycerides (   230    ) or fats in blood are too high                 (  Ideal or Goal is less than 150  ! )    - Recommend avoid fried & greasy foods,  sweets / candy,   - Avoid white rice  (brown or wild rice or Quinoa is OK),   - Avoid white potatoes  (sweet potatoes are OK)   - Avoid anything made from white flour  - bagels, doughnuts, rolls, buns, biscuits, white and   wheat breads, pizza crust and traditional  pasta made of white flour & egg white  - (vegetarian pasta or spinach or wheat pasta is OK).    - Multi-grain bread is OK - like multi-grain flat bread or  sandwich thins.   - Avoid alcohol in excess.   - Exercise  is also important.  ============================================================ ============================================================  -  PSA - Very Low  -  Great  ! ============================================================ ============================================================  -  Uric Acid  / Gout Test is Normal & OK ============================================================ ============================================================  -  PTH is hormone that regulates calcium balance & is Normal -   Great    ! ============================================================ ============================================================  -  A1c = 5.4% - Normal - No Diabetes  - Great !  ============================================================ ============================================================  -  Vitamin D = 754 - Excellent   - Vitamin D goal is between 70-100.   - It is very important as a natural anti-inflammatory and helping the  immune system protect against viral infections, like the Covid-19    helping hair, skin, and nails, as well as reducing stroke and  heart attack risk.   - It helps your bones and helps with mood.  - It also decreases numerous cancer risks so please  take it as directed.   - Low Vit D is associated with a 200-300% higher risk for  CANCER   and 200-300% higher risk for HEART   ATTACK  &  STROKE.    - It is also associated with higher death rate at younger ages,   autoimmune diseases like Rheumatoid arthritis, Lupus,  Multiple Sclerosis.     - Also many other serious conditions, like depression, Alzheimer's  Dementia, infertility, muscle aches, fatigue, fibromyalgia   - just to name a few. ============================================================ ============================================================  -  Keep up the [place] Work  ! ============================================================ ============================================================

## 2021-09-04 ENCOUNTER — Encounter: Payer: Self-pay | Admitting: Internal Medicine

## 2021-10-05 ENCOUNTER — Other Ambulatory Visit: Payer: Self-pay | Admitting: Internal Medicine

## 2021-10-05 ENCOUNTER — Encounter: Payer: Self-pay | Admitting: Internal Medicine

## 2021-10-05 MED ORDER — TRAMADOL HCL 50 MG PO TABS: ORAL_TABLET | ORAL | 0 refills | Status: DC

## 2021-11-22 DIAGNOSIS — I495 Sick sinus syndrome: Secondary | ICD-10-CM | POA: Diagnosis not present

## 2021-12-06 DIAGNOSIS — Z950 Presence of cardiac pacemaker: Secondary | ICD-10-CM | POA: Diagnosis not present

## 2021-12-06 DIAGNOSIS — I10 Essential (primary) hypertension: Secondary | ICD-10-CM | POA: Diagnosis not present

## 2021-12-06 DIAGNOSIS — Z8719 Personal history of other diseases of the digestive system: Secondary | ICD-10-CM | POA: Diagnosis not present

## 2021-12-06 DIAGNOSIS — I495 Sick sinus syndrome: Secondary | ICD-10-CM | POA: Diagnosis not present

## 2021-12-06 DIAGNOSIS — I2510 Atherosclerotic heart disease of native coronary artery without angina pectoris: Secondary | ICD-10-CM | POA: Diagnosis not present

## 2021-12-06 DIAGNOSIS — R001 Bradycardia, unspecified: Secondary | ICD-10-CM | POA: Diagnosis not present

## 2021-12-06 DIAGNOSIS — Z955 Presence of coronary angioplasty implant and graft: Secondary | ICD-10-CM | POA: Diagnosis not present

## 2021-12-11 ENCOUNTER — Other Ambulatory Visit: Payer: Self-pay | Admitting: Nurse Practitioner

## 2021-12-11 DIAGNOSIS — N3281 Overactive bladder: Secondary | ICD-10-CM

## 2021-12-16 ENCOUNTER — Ambulatory Visit (INDEPENDENT_AMBULATORY_CARE_PROVIDER_SITE_OTHER): Payer: Medicare Other | Admitting: Nurse Practitioner

## 2021-12-16 ENCOUNTER — Encounter: Payer: Self-pay | Admitting: Nurse Practitioner

## 2021-12-16 VITALS — BP 144/80 | HR 80 | Temp 97.7°F | Wt 205.4 lb

## 2021-12-16 DIAGNOSIS — I2510 Atherosclerotic heart disease of native coronary artery without angina pectoris: Secondary | ICD-10-CM

## 2021-12-16 DIAGNOSIS — R7309 Other abnormal glucose: Secondary | ICD-10-CM | POA: Diagnosis not present

## 2021-12-16 DIAGNOSIS — E782 Mixed hyperlipidemia: Secondary | ICD-10-CM

## 2021-12-16 DIAGNOSIS — Z79899 Other long term (current) drug therapy: Secondary | ICD-10-CM | POA: Diagnosis not present

## 2021-12-16 DIAGNOSIS — F419 Anxiety disorder, unspecified: Secondary | ICD-10-CM

## 2021-12-16 DIAGNOSIS — E559 Vitamin D deficiency, unspecified: Secondary | ICD-10-CM

## 2021-12-16 DIAGNOSIS — Z950 Presence of cardiac pacemaker: Secondary | ICD-10-CM

## 2021-12-16 DIAGNOSIS — N3281 Overactive bladder: Secondary | ICD-10-CM

## 2021-12-16 DIAGNOSIS — K5903 Drug induced constipation: Secondary | ICD-10-CM | POA: Diagnosis not present

## 2021-12-16 DIAGNOSIS — R682 Dry mouth, unspecified: Secondary | ICD-10-CM | POA: Diagnosis not present

## 2021-12-16 DIAGNOSIS — G250 Essential tremor: Secondary | ICD-10-CM | POA: Diagnosis not present

## 2021-12-16 DIAGNOSIS — I10 Essential (primary) hypertension: Secondary | ICD-10-CM | POA: Diagnosis not present

## 2021-12-16 DIAGNOSIS — I495 Sick sinus syndrome: Secondary | ICD-10-CM | POA: Diagnosis not present

## 2021-12-16 NOTE — Patient Instructions (Signed)
Health Maintenance After Age 78 After age 78, you are at a higher risk for certain long-term diseases and infections as well as injuries from falls. Falls are a major cause of broken bones and head injuries in people who are older than age 78. Getting regular preventive care can help to keep you healthy and well. Preventive care includes getting regular testing and making lifestyle changes as recommended by your health care provider. Talk with your health care provider about: Which screenings and tests you should have. A screening is a test that checks for a disease when you have no symptoms. A diet and exercise plan that is right for you. What should I know about screenings and tests to prevent falls? Screening and testing are the best ways to find a health problem early. Early diagnosis and treatment give you the best chance of managing medical conditions that are common after age 78. Certain conditions and lifestyle choices may make you more likely to have a fall. Your health care provider may recommend: Regular vision checks. Poor vision and conditions such as cataracts can make you more likely to have a fall. If you wear glasses, make sure to get your prescription updated if your vision changes. Medicine review. Work with your health care provider to regularly review all of the medicines you are taking, including over-the-counter medicines. Ask your health care provider about any side effects that may make you more likely to have a fall. Tell your health care provider if any medicines that you take make you feel dizzy or sleepy. Strength and balance checks. Your health care provider may recommend certain tests to check your strength and balance while standing, walking, or changing positions. Foot health exam. Foot pain and numbness, as well as not wearing proper footwear, can make you more likely to have a fall. Screenings, including: Osteoporosis screening. Osteoporosis is a condition that causes  the bones to get weaker and break more easily. Blood pressure screening. Blood pressure changes and medicines to control blood pressure can make you feel dizzy. Depression screening. You may be more likely to have a fall if you have a fear of falling, feel depressed, or feel unable to do activities that you used to do. Alcohol use screening. Using too much alcohol can affect your balance and may make you more likely to have a fall. Follow these instructions at home: Lifestyle Do not drink alcohol if: Your health care provider tells you not to drink. If you drink alcohol: Limit how much you have to: 0-1 drink a day for women. 0-2 drinks a day for men. Know how much alcohol is in your drink. In the [place] one drink equals one 12 oz bottle of beer (355 mL), one 5 oz glass of wine (148 mL), or one 1 oz glass of hard liquor (44 mL). Do not use any products that contain nicotine or tobacco. These products include cigarettes, chewing tobacco, and vaping devices, such as e-cigarettes. If you need help quitting, ask your health care provider. Activity  Follow a regular exercise program to stay fit. This will help you maintain your balance. Ask your health care provider what types of exercise are appropriate for you. If you need a cane or walker, use it as recommended by your health care provider. Wear supportive shoes that have nonskid soles. Safety  Remove any tripping hazards, such as rugs, cords, and clutter. Install safety equipment such as grab bars in bathrooms and safety rails on stairs. Keep rooms and walkways   well-lit. General instructions Talk with your health care provider about your risks for falling. Tell your health care provider if: You fall. Be sure to tell your health care provider about all falls, even ones that seem minor. You feel dizzy, tiredness (fatigue), or off-balance. Take over-the-counter and prescription medicines only as told by your health care provider. These include  supplements. Eat a healthy diet and maintain a healthy weight. A healthy diet includes low-fat dairy products, low-fat (lean) meats, and fiber from whole grains, beans, and lots of fruits and vegetables. Stay current with your vaccines. Schedule regular health, dental, and eye exams. Summary Having a healthy lifestyle and getting preventive care can help to protect your health and wellness after age 78. Screening and testing are the best way to find a health problem early and help you avoid having a fall. Early diagnosis and treatment give you the best chance for managing medical conditions that are more common for people who are older than age 78. Falls are a major cause of broken bones and head injuries in people who are older than age 78. Take precautions to prevent a fall at home. Work with your health care provider to learn what changes you can make to improve your health and wellness and to prevent falls. This information is not intended to replace advice given to you by your health care provider. Make sure you discuss any questions you have with your health care provider. Document Revised: 12/14/2020 Document Reviewed: 12/14/2020 Elsevier Patient Education  2023 [employer]  

## 2021-12-16 NOTE — Progress Notes (Signed)
FOLLOW UP  Assessment and Plan:   1. Essential hypertension Above goal today. Continue Imdur, Cozaar, Toprol-XL Monitor blood pressure at home; patient to call if consistently greater than 130/80 Continue DASH diet.   Reminder to go to the ER if any CP, SOB, nausea, dizziness, severe HA, changes vision/speech, left arm numbness and tingling and jaw pain. Cardiology following  - CBC with Differential/Platelet - COMPLETE METABOLIC PANEL WITH GFR  2. Sick sinus syndrome [facility]) Had visit with Cardiology 12/22/21. Cleared for 1 year.  Stable. Continue to monitor.   3. S/P placement of cardiac pacemaker Had visit with Cardiology 12/22/21. Cleared for 1 year.  Stable. Continue to monitor.   4. Hyperlipidemia Elevated triglycerides. Continue Omega 3 Fish Oil. Continue low cholesterol diet and exercise.  Continue to monitor.  - Lipid panel  5. Essential tremor Stable. Continue to monitor  6. Other abnormal glucose A1C at goal. Continue diet and exercise.  Perform daily foot/skin check, notify office of any concerning changes.  Continue to monitor.  7. Overactive bladder Continue Oxybutynin Continue to monitor.  8. Vitamin D deficiency At goal. Continue supplement  9. Anxiety Controlled.  New no events. Continue Valim PRN  10. Dry mouth Decrease Oxybutynin from BID to QD (AM) Monitor urinary symptoms. Continue to monitor.   11. Drug-induced constipation Decrease Oxybutynin to once daily (AM) Drink more water. Reman active to enhance peristaltic movements  Discussed daily stool softener Discussed effects of long term narcotic use. Continue to monitor.   12. Medication management All medications discussed and reviewed. All questions and concerns addressed.  - CBC with Differential/Platelet - COMPLETE METABOLIC PANEL WITH GFR - Lipid panel  Continue diet and meds as discussed. Further disposition pending results of labs. Discussed med's effects and SE's.    Over 30 minutes of exam, counseling, chart review, and critical decision making was performed.   Future Appointments  Date Time Provider Department Center  03/24/2022 11:00 AM Revonda Humphrey, NP [facility] None  09/06/2022  3:00 PM Lucky Cowboy, MD [facility] None    ----------------------------------------------------------------------------------------------------------------------  HPI 76 y.o. male  presents for 3 month follow up on hypertension, cholesterol, diabetes, weight and vitamin D deficiency.   He shares with me today that he has noticed an increase in dry mouth and constipation.  BM is still regular, daily but notices straining and harder to move. He takes Tramadol PRN and reports one tablet every 2-3 weeks.  He is also taking Oxybutynin for overactive bladder. He is trying to stay well hydrated and remain active.    He last saw Cardiology 12/06/21 for SSS and pacemaker follow up.  States he had a good report.  He will follow up in 1 year.   BMI is Body mass index is 27.29 kg/m., he has not been working on diet and exercise. Wt Readings from Last 3 Encounters:  12/16/21 205 lb 6.4 oz (93.2 kg)  09/01/21 198 lb (89.8 kg)  03/23/21 197 lb (89.4 kg)    His blood pressure has been controlled at home, today their BP is BP: (!) 144/80  He does not workout. He denies chest pain, shortness of breath, dizziness.   He is on cholesterol medication Atorvastatin and denies myalgias. His cholesterol is not at goal. The cholesterol last visit was:   Lab Results  Component Value Date   CHOL 113 09/01/2021   HDL 31 (L) 09/01/2021   LDLCALC 53 09/01/2021   TRIG 230 (H) 09/01/2021   CHOLHDL 3.6 09/01/2021  He has been working on diet and exercise for prediabetes, and denies hyperglycemia and hypoglycemia . Last A1C in the office was:  Lab Results  Component Value Date   HGBA1C 5.4 09/01/2021   Patient is on Vitamin D supplement.   Lab Results  Component Value Date   VD25OH  74 09/01/2021        Current Medications:  Current Outpatient Medications on File Prior to Visit  Medication Sig   acetaminophen (TYLENOL) 500 MG tablet Take 500 mg by mouth every 6 (six) hours as needed.   aspirin EC 81 MG tablet Take 1 tablet Daily   atorvastatin (LIPITOR) 80 MG tablet Take  1 tablet  Daily  for Cholesterol   Cholecalciferol (VITAMIN D) 2000 units tablet Take 8,000 Units by mouth every morning.    diazepam (VALIUM) 5 MG tablet Take  1 tablet  3 x /day  for Hereditary Tremor   isosorbide mononitrate (IMDUR) 30 MG 24 hr tablet TAKE 1 TABLET(30 MG) BY MOUTH DAILY   losartan (COZAAR) 50 MG tablet Take 50 mg by mouth.   MAGNESIUM PO Take by mouth. Takes magnesium 250 mg gummy twice a day.   metoprolol succinate (TOPROL-XL) 25 MG 24 hr tablet Take 1 tablet by mouth daily.   Minoxidil 5 % FOAM Apply 1 application topically daily.   nitroGLYCERIN (NITROSTAT) 0.4 MG SL tablet Place 1 tablet under the tongue every 5 (five) minutes as needed. For chest pain   Omega-3 Fatty Acids (FISH OIL) 1200 MG CPDR Take 2 capsules by mouth at bedtime.    oxybutynin (DITROPAN-XL) 10 MG 24 hr tablet Take 1 tablet 2 x /day for Bladder                                             /                                         TAKE                             BY                             MOUTH   pantoprazole (PROTONIX) 40 MG tablet Take 40 mg by mouth daily.   traMADol (ULTRAM) 50 MG tablet Take  1/2 to 1 tablet  every 4 hours  Only  if Needed  for Severe Pain   triamcinolone ointment (KENALOG) 0.1 % Apply 1 application topically 3 (three) times daily.   vitamin B-12 (CYANOCOBALAMIN) 1000 MCG tablet Take 1,000 mcg by mouth daily.   No current facility-administered medications on file prior to visit.     Allergies:  Allergies  Allergen Reactions   Enalapril Rash   Nsaids     Internal bleeding   Welchol [Colesevelam Hcl] Diarrhea   Vasotec [Enalaprilat] Rash     Medical History:  Past  Medical History:  Diagnosis Date   CAD (coronary artery disease)    Essential tremor    Gastric ulcer 01/14/2015   Gout    Hyperlipidemia    Hypertension    Parkinson's disease (HCC)    Prediabetes    Proteinuria  Vitamin D deficiency    Family history- Reviewed and unchanged Social history- Reviewed and unchanged   Review of Systems:  Review of Systems  Constitutional: Negative.   HENT:  Negative for hearing loss and tinnitus.        Dry mouth  Eyes:  Negative for blurred vision.  Respiratory:  Negative for cough and wheezing.   Cardiovascular:  Negative for chest pain, palpitations and leg swelling.  Gastrointestinal:  Positive for constipation. Negative for abdominal pain, blood in stool, heartburn, melena, nausea and vomiting.  Genitourinary:  Negative for dysuria, flank pain, frequency, hematuria and urgency.  Musculoskeletal:  Negative for back pain, falls, joint pain, myalgias and neck pain.  Skin: Negative.   Neurological:  Negative for dizziness, tremors, weakness and headaches.  Endo/Heme/Allergies:  Bruises/bleeds easily.  Psychiatric/Behavioral:  Negative for depression. The patient is not nervous/anxious.      Physical Exam: BP (!) 144/80   Pulse 80   Temp 97.7 F (36.5 C)   Wt 205 lb 6.4 oz (93.2 kg)   SpO2 98%   BMI 27.29 kg/m  Wt Readings from Last 3 Encounters:  12/16/21 205 lb 6.4 oz (93.2 kg)  09/01/21 198 lb (89.8 kg)  03/23/21 197 lb (89.4 kg)   General Appearance: Well nourished, in no apparent distress. Eyes: PERRLA, EOMs, conjunctiva no swelling or erythema Sinuses: No Frontal/maxillary tenderness ENT/Mouth: Ext aud canals clear, TMs without erythema, bulging. No erythema, swelling, or exudate on post pharynx.  Tonsils not swollen or erythematous. Hearing normal.  Neck: Supple, thyroid normal.  Respiratory: Respiratory effort normal, BS equal bilaterally without rales, rhonchi, wheezing or stridor.  Cardio: RRR with no MRGs. Brisk  peripheral pulses without edema.  Abdomen: Firm, + BS x all four quadrants.  Non tender, no guarding, rebound, hernias, masses. Lymphatics: Non tender without lymphadenopathy.  Musculoskeletal: Full ROM, 5/5 strength, Normal gait Skin: Warm, dry without rashes, lesions, ecchymosis.  Neuro: Cranial nerves intact. No cerebellar symptoms.  Psych: Awake and oriented X 3, normal affect, Insight and Judgment appropriate.    Adela Glimpse, NP 2:52 PM [facility]

## 2021-12-17 LAB — COMPLETE METABOLIC PANEL WITH GFR
AG Ratio: 1.7 (calc) (ref 1.0–2.5)
ALT: 15 U/L (ref 9–46)
AST: 19 U/L (ref 10–35)
Albumin: 4 g/dL (ref 3.6–5.1)
Alkaline phosphatase (APISO): 51 U/L (ref 35–144)
BUN: 22 mg/dL (ref 7–25)
CO2: 31 mmol/L (ref 20–32)
Calcium: 10.2 mg/dL (ref 8.6–10.3)
Chloride: 104 mmol/L (ref 98–110)
Creat: 1.13 mg/dL (ref 0.70–1.28)
Globulin: 2.4 g/dL (calc) (ref 1.9–3.7)
Glucose, Bld: 90 mg/dL (ref 65–99)
Potassium: 5.6 mmol/L — ABNORMAL HIGH (ref 3.5–5.3)
Sodium: 140 mmol/L (ref 135–146)
Total Bilirubin: 0.8 mg/dL (ref 0.2–1.2)
Total Protein: 6.4 g/dL (ref 6.1–8.1)
eGFR: 67 mL/min/{1.73_m2} (ref >=60)

## 2021-12-17 LAB — CBC WITH DIFFERENTIAL/PLATELET
Absolute Monocytes: 396 cells/uL (ref 200–950)
Basophils Absolute: 39 cells/uL (ref 0–200)
Basophils Relative: 0.9 %
Eosinophils Absolute: 120 cells/uL (ref 15–500)
Eosinophils Relative: 2.8 %
HCT: 42.1 % (ref 38.5–50.0)
Hemoglobin: 14 g/dL (ref 13.2–17.1)
Lymphs Abs: 1888 cells/uL (ref 850–3900)
MCH: 33.6 pg — ABNORMAL HIGH (ref 27.0–33.0)
MCHC: 33.3 g/dL (ref 32.0–36.0)
MCV: 101 fL — ABNORMAL HIGH (ref 80.0–100.0)
MPV: 9.9 fL (ref 7.5–12.5)
Monocytes Relative: 9.2 %
Neutro Abs: 1858 cells/uL (ref 1500–7800)
Neutrophils Relative %: 43.2 %
Platelets: 130 10*3/uL — ABNORMAL LOW (ref 140–400)
RBC: 4.17 10*6/uL — ABNORMAL LOW (ref 4.20–5.80)
RDW: 12.3 % (ref 11.0–15.0)
Total Lymphocyte: 43.9 %
WBC: 4.3 10*3/uL (ref 3.8–10.8)

## 2021-12-17 LAB — LIPID PANEL
Cholesterol: 115 mg/dL (ref <200)
HDL: 38 mg/dL — ABNORMAL LOW (ref >=40)
LDL Cholesterol (Calc): 61 mg/dL (calc)
Non-HDL Cholesterol (Calc): 77 mg/dL (calc) (ref <130)
Total CHOL/HDL Ratio: 3 (calc) (ref <5.0)
Triglycerides: 84 mg/dL (ref <150)

## 2022-01-07 ENCOUNTER — Other Ambulatory Visit: Payer: Self-pay | Admitting: Internal Medicine

## 2022-01-07 DIAGNOSIS — E7800 Pure hypercholesterolemia, unspecified: Secondary | ICD-10-CM

## 2022-01-07 DIAGNOSIS — I2510 Atherosclerotic heart disease of native coronary artery without angina pectoris: Secondary | ICD-10-CM

## 2022-02-15 ENCOUNTER — Other Ambulatory Visit: Payer: Self-pay | Admitting: Internal Medicine

## 2022-02-15 DIAGNOSIS — G250 Essential tremor: Secondary | ICD-10-CM

## 2022-02-27 DIAGNOSIS — I495 Sick sinus syndrome: Secondary | ICD-10-CM | POA: Diagnosis not present

## 2022-03-24 ENCOUNTER — Ambulatory Visit: Payer: Medicare Other | Admitting: Nurse Practitioner

## 2022-04-07 NOTE — Progress Notes (Signed)
MEDICARE ANNUAL WELLNESS VISIT AND FOLLOW UP Assessment:   Diagnoses and all orders for this visit:  Hager was seen today for follow-up and medicare wellness.  Diagnoses and all orders for this visit:  Encounter for Medicare annual wellness exam Due Yearly  Essential hypertension -     CBC with Differential/Platelet - - continue medications, DASH diet, exercise and monitor at home. Call if greater than 130/80.    Sick Sinus Syndrome(HCC) Continue medications Follows with cardiology  Paroxysmal Atrial Fibrillation(HCC) Continue medications and follow with cardiology  Indwelling pacemaker Continue to follow with cardiology  Hyperlipidemia, mixed -     COMPLETE METABOLIC PANEL WITH GFR -     Lipid panel  Abnormal glucose -     COMPLETE METABOLIC PANEL WITH GFR  Chronic Renal Failure Stage 2       - Push fluids, diet and exercise       - Glycemic and cholesterol control  Coronary artery disease involving native coronary artery of native heart, angina presence unspecified Control blood pressure, cholesterol, glucose, increase exercise.  Followed by cardiology Nitroglycerine if needed   S/P coronary artery stent placement x1 (2019) Continue BB, statin,  Followed by cardiology   S/P IVC filter Has follow up scheduled   DVT of axillary vein, acute bilateral ([place]) IVC filter in place.   Gastrointestinal hemorrhage associated with duodenal ulcer Avoid NSAID's Continue to monitor  Medication management -     CBC with Differential/Platelet -     COMPLETE METABOLIC PANEL WITH GFR -     Magnesium -     Lipid panel -     TSH  Essential tremor       - Continue Metoprolol  and valium and monitor symptoms      Continue diet and meds as discussed. Further disposition pending results of labs. Over 40 minutes of face to face interview, exam, counseling, chart review, and critical decision making was performed.  Future Appointments  Date Time Provider [place]  09/06/2022  3:00 PM Unk Pinto, MD [facility] None  04/13/2023  2:30 PM Alycia Rossetti, NP [facility] None     Plan:   During the course of the visit the patient was educated and counseled about appropriate screening and preventive services including:   Pneumococcal vaccine  Hepatitis B vaccine Td vaccine Screening electrocardiogram Colorectal cancer screening Diabetes screening Glaucoma screening Nutrition counseling    Subjective:  Leroy Griffin is a 76 y.o. male who presents accompanied for Medicare Annual Wellness Visit and 6 month follow up for HTN, HLD, prediabetes, and vitamin D Def.   Patient had negative screening stress test and Cardiac MRI in Feb 2017 per Dr Caryl Comes, however demonstrated unstable angina recently in 11/2017 and underwent diagnostic heart cath by Dr. Francella Solian. [place] which demonstrated high-grade stenosis of distal LAD (85-90%) and diagonal (90%) - stent was placed to LAD with balloon inflation of diagonal with improvement to 50%. He is on brilinta and bASA. He does have a cardiac pacemaker. He is following with Dr. Elonda Husky currently. He has not had to use his nitroglycerin.  Will occasionally get shortness of breath with exertion  Has urinary frequency, nocturia x 0-5, very rare incontinence if he waits too long. Occ hesitancy, dribbling, weak stream. - has seen Dr. Jeffie Pollock in recent weeks, reports normal prostate, has small bladder and OAB, has been restarted on ER oxybutynin with "spotty" benefit per patient. Myrbetriq worked better but too expensive. Having very dry mouth with Oxybutynin  as well as constipation.  Using Colace and Miralax for constipation  Tremor is very controlled with use of Valium and Metorpolol.  He continues to have pain in knees and some associated weakness. Has a patellar fracture in the past. Working on increasing strength and flexibility. Will occasionally use Tramadol but most times will just rest   BMI is Body mass index is  26.86 kg/m., he has been working on diet and exercise. Wife is strictly watching his diet for heart healthy diet and portion control.  Wt Readings from Last 3 Encounters:  04/12/22 202 lb 3.2 oz (91.7 kg)  12/16/21 205 lb 6.4 oz (93.2 kg)  09/01/21 198 lb (89.8 kg)   His blood pressure has been controlled at home, today their BP is BP: (!) 142/82  BP Readings from Last 3 Encounters:  04/12/22 (!) 142/82  12/16/21 (!) 144/80  09/01/21 124/78  He denies chest pain, shortness of breath, dizziness.   He is on cholesterol medication (atorvastatin 80 mg daily) and denies myalgias. His cholesterol is at goal. The cholesterol last visit was:   Lab Results  Component Value Date   CHOL 115 12/16/2021   HDL 38 (L) 12/16/2021   LDLCALC 61 12/16/2021   TRIG 84 12/16/2021   CHOLHDL 3.0 12/16/2021   He has been working on diet and exercise for glucose management, and denies paresthesia of the feet, polydipsia and polyuria. Last A1C in the office was:  Lab Results  Component Value Date   HGBA1C 5.4 09/01/2021   Patient also has Idiopathic proteinuria & CKD2 followed by Dr Raymondo Band.  Lab Results  Component Value Date   EGFR 67 12/16/2021    Patient is on Vitamin D supplement.   Lab Results  Component Value Date   VD25OH 74 09/01/2021       Medication Review: Current Outpatient Medications on File Prior to Visit  Medication Sig Dispense Refill   acetaminophen (TYLENOL) 500 MG tablet Take 500 mg by mouth every 6 (six) hours as needed.     aspirin EC 81 MG tablet Take 1 tablet Daily     atorvastatin (LIPITOR) 80 MG tablet TAKE 1 TABLET BY MOUTH EVERY DAY 90 tablet 1   Cholecalciferol (VITAMIN D) 2000 units tablet Take 8,000 Units by mouth every morning.      diazepam (VALIUM) 5 MG tablet TAKE 1 TABLET 3 X /DAY FOR HEREDITARY TREMOR 270 tablet 1   isosorbide mononitrate (IMDUR) 30 MG 24 hr tablet TAKE 1 TABLET(30 MG) BY MOUTH DAILY     losartan (COZAAR) 50 MG tablet Take 50 mg  by mouth.     MAGNESIUM PO Take by mouth. Takes magnesium 250 mg gummy twice a day.     metoprolol succinate (TOPROL-XL) 25 MG 24 hr tablet Take 1 tablet by mouth daily.     Minoxidil 5 % FOAM Apply 1 application topically daily.     nitroGLYCERIN (NITROSTAT) 0.4 MG SL tablet Place 1 tablet under the tongue every 5 (five) minutes as needed. For chest pain     Omega-3 Fatty Acids (FISH OIL) 1200 MG CPDR Take 2 capsules by mouth at bedtime.      oxybutynin (DITROPAN-XL) 10 MG 24 hr tablet Take 1 tablet 2 x /day for Bladder                                             /  TAKE                             BY                             MOUTH 180 tablet 1   pantoprazole (PROTONIX) 40 MG tablet Take 40 mg by mouth daily.     traMADol (ULTRAM) 50 MG tablet Take  1/2 to 1 tablet  every 4 hours  Only  if Needed  for Severe Pain 30 tablet 0   triamcinolone ointment (KENALOG) 0.1 % Apply 1 application topically 3 (three) times daily. 80 g 3   vitamin B-12 (CYANOCOBALAMIN) 1000 MCG tablet Take 1,000 mcg by mouth daily.     No current facility-administered medications on file prior to visit.    Current Problems (verified) Patient Active Problem List   Diagnosis Date Noted   S/P placement of cardiac pacemaker 11/18/2020   Other abnormalities of gait and mobility 01/17/2019   Depression due to physical illness 01/17/2019   Overactive bladder 01/11/2018   S/P drug eluting coronary stent placement 11/21/2017   CAD in native artery 11/20/2017   Anxiety 11/14/2017   Overweight (BMI 25.0-29.9) 08/26/2014   Sick sinus syndrome ([place]) 10/10/2013   Medication management 10/06/2013   Hyperlipidemia 08/27/2013   Essential tremor    Proteinuria    Other abnormal glucose    Gout    Vitamin D deficiency    Essential hypertension 10/10/2010    Screening Tests Immunization History  Administered Date(s) Administered   Influenza Split 05/23/2013   Influenza, High Dose  Seasonal PF 05/14/2014, 06/11/2015, 05/19/2016, 06/10/2017, 05/20/2019   Influenza-Unspecified 04/22/2020   PFIZER(Purple Top)SARS-COV-2 Vaccination 10/11/2019, 11/01/2019, 06/10/2020   Pneumococcal Conjugate-13 12/03/2014   Pneumococcal Polysaccharide-23 06/01/2010   Td 06/01/2010   Tdap 01/28/2014   Zoster, Live 08/08/2004    Preventative care: Last colonoscopy: 2013 due 10 years EGD: 2019 Echo 08/2015 Stress test 09/2015 Cardiac cath 11/2017  Prior vaccinations: TD or Tdap: 2015 Influenza:04/22/20 Pneumococcal: 2011 Prevnar 2016 Shingles/Zostavax: 2006  SARS-COV2-Pfizer :  10/11/19, 11/01/19, 06/10/20  Names of Other Physician/Practitioners you currently use: 1. [facility] here for primary care 2. Dr. Domingo Cocking, [facility], last visit 03/2021 3. Dr. Nicoletta Dress 02/2022 q 6 months  Patient Care Team: Unk Pinto, MD as PCP - General (Internal Medicine) Renda Rolls, Jennefer Bravo, MD as Referring Physician (Dermatology) Dwan Bolt, MD as Referring Physician (Cardiology) Corliss Parish, MD as Consulting Physician (Nephrology) Darlis Loan, MD as Referring Physician (Gastroenterology)   Allergies Allergies  Allergen Reactions   Enalapril Rash   Nsaids     Internal bleeding   Welchol [Colesevelam Hcl] Diarrhea   Vasotec [Enalaprilat] Rash    SURGICAL HISTORY He  has a past surgical history that includes Knee surgery (Right); Tonsillectomy; Cyst excision; and Cholecystectomy. FAMILY HISTORY His family history includes Dementia in his mother; Emphysema in his brother; Heart disease in his brother; Stroke in his father. SOCIAL HISTORY He  reports that he has never smoked. He has never used smokeless tobacco. He reports that he does not currently use alcohol after a past usage of about 14.0 standard drinks of alcohol per week. He reports that he does not use drugs.  MEDICARE WELLNESS OBJECTIVES: Physical activity:  Current Exercise Habits: Home exercise routine, Type of exercise: walking;stretching;strength training/weights, Time (Minutes): 30, Frequency (Times/Week): 7, Weekly Exercise (  Minutes/Week): 210 Cardiac risk factors: Cardiac Risk Factors include: advanced age (>58mn, >>54women);dyslipidemia;hypertension;male gender Depression/mood screen:      04/12/2022    2:29 PM  Depression screen PHQ 2/9  Decreased Interest 0  Down, Depressed, Hopeless 0  PHQ - 2 Score 0    ADLs:     04/12/2022    2:36 PM  In your present state of health, do you have any difficulty performing the following activities:  Hearing? 0  Vision? 0  Difficulty concentrating or making decisions? 0  Walking or climbing stairs? 0  Dressing or bathing? 0  Doing errands, shopping? 0     Cognitive Testing  Alert? Yes  Normal Appearance?Yes  Oriented to person? Yes  Place? Yes   Time? Yes  Recall of three objects?  Yes  Can perform simple calculations? Yes  Displays appropriate judgment?Yes  Can read the correct time from a watch face?Yes  EOL planning: Does Patient Have a Medical Advance Directive?: Yes Type of Advance Directive: Healthcare Power of Attorney, Living will Does patient want to make changes to medical advance directive?: No - Patient declined Copy of H[place]arin Chart?: No - copy requested    Objective:   Blood pressure (!) 142/82, pulse 77, temperature 97.7 F (36.5 C), height 6' 0.75" (1.848 m), weight 202 lb 3.2 oz (91.7 kg), SpO2 97 %. Body mass index is 26.86 kg/m.  General appearance: alert, no distress, WD/WN, male, patient wearing mask. HEENT: normocephalic, sclerae anicteric, TMs pearly, nares patent, no discharge or erythema, pharynx normal Oral cavity: MMM, no lesions Neck: supple, no lymphadenopathy, no thyromegaly, no masses Heart: RRR, normal S1, S2, no murmurs Lungs: CTA bilaterally, no wheezes, rhonchi, or rales Abdomen: +bs, soft, non tender, non distended, no  masses, no hepatomegaly, no splenomegaly Musculoskeletal: nontender, no swelling, no obvious deformity Extremities: no edema, no cyanosis, no clubbing Pulses: 2+ symmetric, upper and lower extremities, normal cap refill Neurological: alert, oriented x 3, CN2-12 intact, strength normal upper extremities and lower extremities, , mild tremor of the right hand present, sensation normal throughout, DTRs 2+ throughout, no cerebellar signs, shuffling bent over gait with cane or walker Psychiatric: normal affect, behavior normal, pleasant   Medicare Attestation I have personally reviewed: The patient's medical and social history Their use of alcohol, tobacco or illicit drugs Their current medications and supplements The patient's functional ability including ADLs,fall risks, home safety risks, cognitive, and hearing and visual impairment Diet and physical activities Evidence for depression or mood disorders  The patient's weight, height, BMI, and visual acuity have been recorded in the chart.  I have made referrals, counseling, and provided education to the patient based on review of the above and I have provided the patient with a written personalized care plan for preventive services.    DMagda BernheimANP-C  [facility]  04/12/2022

## 2022-04-12 ENCOUNTER — Encounter: Payer: Self-pay | Admitting: Nurse Practitioner

## 2022-04-12 ENCOUNTER — Ambulatory Visit (INDEPENDENT_AMBULATORY_CARE_PROVIDER_SITE_OTHER): Payer: Medicare Other | Admitting: Nurse Practitioner

## 2022-04-12 VITALS — BP 142/82 | HR 77 | Temp 97.7°F | Ht 72.75 in | Wt 202.2 lb

## 2022-04-12 DIAGNOSIS — E559 Vitamin D deficiency, unspecified: Secondary | ICD-10-CM | POA: Diagnosis not present

## 2022-04-12 DIAGNOSIS — Z79899 Other long term (current) drug therapy: Secondary | ICD-10-CM

## 2022-04-12 DIAGNOSIS — Z950 Presence of cardiac pacemaker: Secondary | ICD-10-CM

## 2022-04-12 DIAGNOSIS — I495 Sick sinus syndrome: Secondary | ICD-10-CM | POA: Diagnosis not present

## 2022-04-12 DIAGNOSIS — G250 Essential tremor: Secondary | ICD-10-CM | POA: Diagnosis not present

## 2022-04-12 DIAGNOSIS — E782 Mixed hyperlipidemia: Secondary | ICD-10-CM | POA: Diagnosis not present

## 2022-04-12 DIAGNOSIS — N182 Chronic kidney disease, stage 2 (mild): Secondary | ICD-10-CM

## 2022-04-12 DIAGNOSIS — I480 Paroxysmal atrial fibrillation: Secondary | ICD-10-CM | POA: Diagnosis not present

## 2022-04-12 DIAGNOSIS — Z955 Presence of coronary angioplasty implant and graft: Secondary | ICD-10-CM | POA: Diagnosis not present

## 2022-04-12 DIAGNOSIS — R7309 Other abnormal glucose: Secondary | ICD-10-CM

## 2022-04-12 DIAGNOSIS — I10 Essential (primary) hypertension: Secondary | ICD-10-CM | POA: Diagnosis not present

## 2022-04-12 DIAGNOSIS — I82A13 Acute embolism and thrombosis of axillary vein, bilateral: Secondary | ICD-10-CM

## 2022-04-12 DIAGNOSIS — I2510 Atherosclerotic heart disease of native coronary artery without angina pectoris: Secondary | ICD-10-CM

## 2022-04-12 DIAGNOSIS — Z0001 Encounter for general adult medical examination with abnormal findings: Secondary | ICD-10-CM | POA: Diagnosis not present

## 2022-04-12 DIAGNOSIS — R6889 Other general symptoms and signs: Secondary | ICD-10-CM

## 2022-04-12 DIAGNOSIS — Z0000 Encounter for general adult medical examination without abnormal findings: Secondary | ICD-10-CM

## 2022-04-12 DIAGNOSIS — K264 Chronic or unspecified duodenal ulcer with hemorrhage: Secondary | ICD-10-CM

## 2022-04-13 LAB — CBC WITH DIFFERENTIAL/PLATELET
Absolute Monocytes: 333 cells/uL (ref 200–950)
Basophils Absolute: 19 cells/uL (ref 0–200)
Basophils Relative: 0.5 %
Eosinophils Absolute: 192 cells/uL (ref 15–500)
Eosinophils Relative: 5.2 %
HCT: 42.4 % (ref 38.5–50.0)
Hemoglobin: 14.6 g/dL (ref 13.2–17.1)
Lymphs Abs: 1561 cells/uL (ref 850–3900)
MCH: 33.6 pg — ABNORMAL HIGH (ref 27.0–33.0)
MCHC: 34.4 g/dL (ref 32.0–36.0)
MCV: 97.5 fL (ref 80.0–100.0)
MPV: 10.2 fL (ref 7.5–12.5)
Monocytes Relative: 9 %
Neutro Abs: 1595 cells/uL (ref 1500–7800)
Neutrophils Relative %: 43.1 %
Platelets: 126 10*3/uL — ABNORMAL LOW (ref 140–400)
RBC: 4.35 10*6/uL (ref 4.20–5.80)
RDW: 12.7 % (ref 11.0–15.0)
Total Lymphocyte: 42.2 %
WBC: 3.7 10*3/uL — ABNORMAL LOW (ref 3.8–10.8)

## 2022-04-13 LAB — COMPLETE METABOLIC PANEL WITH GFR
AG Ratio: 1.5 (calc) (ref 1.0–2.5)
ALT: 19 U/L (ref 9–46)
AST: 21 U/L (ref 10–35)
Albumin: 4.1 g/dL (ref 3.6–5.1)
Alkaline phosphatase (APISO): 51 U/L (ref 35–144)
BUN: 22 mg/dL (ref 7–25)
CO2: 26 mmol/L (ref 20–32)
Calcium: 10 mg/dL (ref 8.6–10.3)
Chloride: 104 mmol/L (ref 98–110)
Creat: 1.21 mg/dL (ref 0.70–1.28)
Globulin: 2.7 g/dL (calc) (ref 1.9–3.7)
Glucose, Bld: 93 mg/dL (ref 65–99)
Potassium: 4.5 mmol/L (ref 3.5–5.3)
Sodium: 141 mmol/L (ref 135–146)
Total Bilirubin: 1 mg/dL (ref 0.2–1.2)
Total Protein: 6.8 g/dL (ref 6.1–8.1)
eGFR: 61 mL/min/{1.73_m2} (ref >=60)

## 2022-04-13 LAB — LIPID PANEL
Cholesterol: 122 mg/dL (ref <200)
HDL: 39 mg/dL — ABNORMAL LOW (ref >=40)
LDL Cholesterol (Calc): 61 mg/dL (calc)
Non-HDL Cholesterol (Calc): 83 mg/dL (calc) (ref <130)
Total CHOL/HDL Ratio: 3.1 (calc) (ref <5.0)
Triglycerides: 140 mg/dL (ref <150)

## 2022-04-13 LAB — HEMOGLOBIN A1C
Hgb A1c MFr Bld: 5.4 % of total Hgb (ref <5.7)
Mean Plasma Glucose: 108 mg/dL
eAG (mmol/L): 6 mmol/L

## 2022-04-29 ENCOUNTER — Other Ambulatory Visit: Payer: Self-pay | Admitting: Internal Medicine

## 2022-04-30 MED ORDER — TRAMADOL HCL 50 MG PO TABS: ORAL_TABLET | ORAL | 0 refills | Status: DC

## 2022-05-12 DIAGNOSIS — E669 Obesity, unspecified: Secondary | ICD-10-CM | POA: Diagnosis not present

## 2022-05-12 DIAGNOSIS — I2510 Atherosclerotic heart disease of native coronary artery without angina pectoris: Secondary | ICD-10-CM | POA: Diagnosis not present

## 2022-05-12 DIAGNOSIS — R809 Proteinuria, unspecified: Secondary | ICD-10-CM | POA: Diagnosis not present

## 2022-05-12 DIAGNOSIS — R351 Nocturia: Secondary | ICD-10-CM | POA: Diagnosis not present

## 2022-05-12 DIAGNOSIS — I129 Hypertensive chronic kidney disease with stage 1 through stage 4 chronic kidney disease, or unspecified chronic kidney disease: Secondary | ICD-10-CM | POA: Diagnosis not present

## 2022-05-12 DIAGNOSIS — E785 Hyperlipidemia, unspecified: Secondary | ICD-10-CM | POA: Diagnosis not present

## 2022-05-17 DIAGNOSIS — I495 Sick sinus syndrome: Secondary | ICD-10-CM | POA: Diagnosis not present

## 2022-06-02 ENCOUNTER — Encounter: Payer: Self-pay | Admitting: Internal Medicine

## 2022-06-13 DIAGNOSIS — Z23 Encounter for immunization: Secondary | ICD-10-CM | POA: Diagnosis not present

## 2022-06-20 DIAGNOSIS — I10 Essential (primary) hypertension: Secondary | ICD-10-CM | POA: Diagnosis not present

## 2022-06-20 DIAGNOSIS — K279 Peptic ulcer, site unspecified, unspecified as acute or chronic, without hemorrhage or perforation: Secondary | ICD-10-CM | POA: Diagnosis not present

## 2022-07-05 ENCOUNTER — Other Ambulatory Visit: Payer: Self-pay | Admitting: Nurse Practitioner

## 2022-07-05 ENCOUNTER — Other Ambulatory Visit: Payer: Self-pay | Admitting: Internal Medicine

## 2022-07-05 DIAGNOSIS — E7800 Pure hypercholesterolemia, unspecified: Secondary | ICD-10-CM

## 2022-07-05 DIAGNOSIS — I2510 Atherosclerotic heart disease of native coronary artery without angina pectoris: Secondary | ICD-10-CM

## 2022-07-05 DIAGNOSIS — N3281 Overactive bladder: Secondary | ICD-10-CM

## 2022-07-22 DIAGNOSIS — H353132 Nonexudative age-related macular degeneration, bilateral, intermediate dry stage: Secondary | ICD-10-CM | POA: Diagnosis not present

## 2022-07-22 DIAGNOSIS — H524 Presbyopia: Secondary | ICD-10-CM | POA: Diagnosis not present

## 2022-07-22 DIAGNOSIS — H25813 Combined forms of age-related cataract, bilateral: Secondary | ICD-10-CM | POA: Diagnosis not present

## 2022-07-22 DIAGNOSIS — H43813 Vitreous degeneration, bilateral: Secondary | ICD-10-CM | POA: Diagnosis not present

## 2022-08-21 DIAGNOSIS — I495 Sick sinus syndrome: Secondary | ICD-10-CM | POA: Diagnosis not present

## 2022-09-06 ENCOUNTER — Ambulatory Visit (INDEPENDENT_AMBULATORY_CARE_PROVIDER_SITE_OTHER): Payer: Medicare Other | Admitting: Internal Medicine

## 2022-09-06 ENCOUNTER — Encounter: Payer: Self-pay | Admitting: Internal Medicine

## 2022-09-06 VITALS — BP 134/82 | HR 86 | Temp 97.7°F | Resp 16 | Ht 72.75 in | Wt 202.6 lb

## 2022-09-06 DIAGNOSIS — Z125 Encounter for screening for malignant neoplasm of prostate: Secondary | ICD-10-CM

## 2022-09-06 DIAGNOSIS — E559 Vitamin D deficiency, unspecified: Secondary | ICD-10-CM

## 2022-09-06 DIAGNOSIS — I2510 Atherosclerotic heart disease of native coronary artery without angina pectoris: Secondary | ICD-10-CM | POA: Diagnosis not present

## 2022-09-06 DIAGNOSIS — E782 Mixed hyperlipidemia: Secondary | ICD-10-CM

## 2022-09-06 DIAGNOSIS — Z8249 Family history of ischemic heart disease and other diseases of the circulatory system: Secondary | ICD-10-CM

## 2022-09-06 DIAGNOSIS — Z955 Presence of coronary angioplasty implant and graft: Secondary | ICD-10-CM | POA: Diagnosis not present

## 2022-09-06 DIAGNOSIS — G250 Essential tremor: Secondary | ICD-10-CM

## 2022-09-06 DIAGNOSIS — N401 Enlarged prostate with lower urinary tract symptoms: Secondary | ICD-10-CM | POA: Diagnosis not present

## 2022-09-06 DIAGNOSIS — M1000 Idiopathic gout, unspecified site: Secondary | ICD-10-CM | POA: Diagnosis not present

## 2022-09-06 DIAGNOSIS — I10 Essential (primary) hypertension: Secondary | ICD-10-CM

## 2022-09-06 DIAGNOSIS — R7309 Other abnormal glucose: Secondary | ICD-10-CM

## 2022-09-06 DIAGNOSIS — Z950 Presence of cardiac pacemaker: Secondary | ICD-10-CM

## 2022-09-06 DIAGNOSIS — Z79899 Other long term (current) drug therapy: Secondary | ICD-10-CM | POA: Diagnosis not present

## 2022-09-06 DIAGNOSIS — Z0000 Encounter for general adult medical examination without abnormal findings: Secondary | ICD-10-CM | POA: Diagnosis not present

## 2022-09-06 DIAGNOSIS — Z1211 Encounter for screening for malignant neoplasm of colon: Secondary | ICD-10-CM

## 2022-09-06 DIAGNOSIS — N138 Other obstructive and reflux uropathy: Secondary | ICD-10-CM | POA: Diagnosis not present

## 2022-09-06 DIAGNOSIS — I495 Sick sinus syndrome: Secondary | ICD-10-CM

## 2022-09-06 DIAGNOSIS — Z136 Encounter for screening for cardiovascular disorders: Secondary | ICD-10-CM | POA: Diagnosis not present

## 2022-09-06 NOTE — Patient Instructions (Signed)
Due to recent changes in healthcare laws, you may see the results of your imaging and laboratory studies on MyChart before your provider has had a chance to review them.  We understand that in some cases there may be results that are confusing or concerning to you. Not all laboratory results come back in the same time frame and the provider may be waiting for multiple results in order to interpret others.  Please give [place] 48 hours in order for your provider to thoroughly review all the results before contacting the office for clarification of your results.   +++++++++++++++++++++++++++++++  Vit D  & Vit C 1,000 mg   are recommended to help protect  against the Covid-19 and other Corona viruses.    Also it's recommended  to take  Zinc 50 mg  to help  protect against the Covid-19   and best place to get  is also on [email or web address]  and don't pay more than 6-8 cents /pill !  ================================ Coronavirus (COVID-19) Are you at risk?  Are you at risk for the Coronavirus (COVID-19)?  To be considered HIGH RISK for Coronavirus (COVID-19), you have to meet the following criteria:  Traveled to [place], [place], [place], [place] or [place]; or in the [place] to [place], [place], [place]  or [place]; and have fever, cough, and shortness of breath within the last 2 weeks of travel OR Been in close contact with a person diagnosed with COVID-19 within the last 2 weeks and have  fever, cough,and shortness of breath  IF YOU DO NOT MEET THESE CRITERIA, YOU ARE CONSIDERED LOW RISK FOR COVID-19.  What to do if you are HIGH RISK for COVID-19?  If you are having a medical emergency, call 911. Seek medical care right away. Before you go to a doctor's office, urgent care or emergency department,  call ahead and tell them about your recent travel, contact with someone diagnosed with COVID-19   and your symptoms.  You should receive instructions from your physician's office regarding  next steps of care.  When you arrive at healthcare provider, tell the healthcare staff immediately you have returned from  visiting [place], [place], [place], [place] or [place]; or traveled in the [place] to [place], [place],  [place] or [place] in the last two weeks or you have been in close contact with a person diagnosed with  COVID-19 in the last 2 weeks.   Tell the health care staff about your symptoms: fever, cough and shortness of breath. After you have been seen by a medical provider, you will be either: Tested for (COVID-19) and discharged home on quarantine except to seek medical care if  symptoms worsen, and asked to  Stay home and avoid contact with others until you get your results (4-5 days)  Avoid travel on public transportation if possible (such as bus, train, or airplane) or Sent to the Emergency Department by EMS for evaluation, COVID-19 testing  and  possible admission depending on your condition and test results.  What to do if you are LOW RISK for COVID-19?  Reduce your risk of any infection by using the same precautions used for avoiding the common cold or flu:  Wash your hands often with soap and warm water for at least 20 seconds.  If soap and water are not readily available,  use an alcohol-based hand sanitizer with at least 60% alcohol.  If coughing or sneezing, cover your mouth and nose by coughing  or sneezing into the elbow areas of your shirt or coat,  into a tissue or into your sleeve (not your hands). Avoid shaking hands with others and consider head nods or verbal greetings only. Avoid touching your eyes, nose, or mouth with unwashed hands.  Avoid close contact with people who are sick. Avoid places or events with large numbers of people in one location, like concerts or sporting events. Carefully consider travel plans you have or are making. If you are planning any travel outside or inside the [place], visit the CDC's Travelers' Health webpage for  the latest health notices. If you have some symptoms but not all symptoms, continue to monitor at home and seek medical attention  if your symptoms worsen. If you are having a medical emergency, call 911. >>>>>>>>>>>>>>>>>>>>>>>>>>>>>>>>>>>>>>>>>>>>>>>>>>> We Do NOT Approve of [employer] > > > > > > > > > > > > > > > > > > > > > > > > > > > > > > > > > > >  > >    Preventive Care for Adults  A healthy lifestyle and preventive care can promote health and wellness. Preventive health guidelines for men include the following key practices: A routine yearly physical is a good way to check with your health care provider about your health and preventative screening. It is a chance to share any concerns and updates on your health and to receive a thorough exam. Visit your dentist for a routine exam and preventative care every 6 months. Brush your teeth twice a day and floss once a day. Good oral hygiene prevents tooth decay and gum disease. The frequency of eye exams is based on your age, health, family medical history, use of contact lenses, and other factors. Follow your health care provider's recommendations for frequency of eye exams. Eat a healthy diet. Foods such as vegetables, fruits, whole grains, low-fat dairy products, and lean protein foods contain the nutrients you need without too many calories. Decrease your intake of foods high in solid fats, added sugars, and salt. Eat the right amount of calories for you. Get information about a proper diet from your health care provider, if necessary. Regular physical exercise is one of the most important things you can do for your health. Most adults should get at least 150 minutes of moderate-intensity exercise (any activity that increases your heart rate and causes you to sweat) each week. In addition, most adults need muscle-strengthening exercises on 2 or more days a week. Maintain a healthy weight. The body mass index (BMI) is a screening  tool to identify possible weight problems. It provides an estimate of body fat based on height and weight. Your health care provider can find your BMI and can help you achieve or maintain a healthy weight. For adults 20 years and older: A BMI below 18.5 is considered underweight. A BMI of 18.5 to 24.9 is normal. A BMI of 25 to 29.9 is considered overweight. A BMI of 30 and above is considered obese. Maintain normal blood lipids and cholesterol levels by exercising and minimizing your intake of saturated fat. Eat a balanced diet with plenty of fruit and vegetables. Blood tests for lipids and cholesterol should begin at age 58 and be repeated every 5 years. If your lipid or cholesterol levels are high, you are over 50, or you are at high risk for heart disease, you may need your cholesterol levels checked more frequently. Ongoing high lipid and cholesterol levels should be  treated with medicines if diet and exercise are not working. If you smoke, find out from your health care provider how to quit. If you do not use tobacco, do not start. Lung cancer screening is recommended for adults aged 47-80 years who are at high risk for developing lung cancer because of a history of smoking. A yearly low-dose CT scan of the lungs is recommended for people who have at least a 30-pack-year history of smoking and are a current smoker or have quit within the past 15 years. A pack year of smoking is smoking an average of 1 pack of cigarettes a day for 1 year (for example: 1 pack a day for 30 years or 2 packs a day for 15 years). Yearly screening should continue until the smoker has stopped smoking for at least 15 years. Yearly screening should be stopped for people who develop a health problem that would prevent them from having lung cancer treatment. If you choose to drink alcohol, do not have more than 2 drinks per day. One drink is considered to be 12 ounces (355 mL) of beer, 5 ounces (148 mL) of wine, or 1.5 ounces (44  mL) of liquor. Avoid use of street drugs. Do not share needles with anyone. Ask for help if you need support or instructions about stopping the use of drugs. High blood pressure causes heart disease and increases the risk of stroke. Your blood pressure should be checked at least every 1-2 years. Ongoing high blood pressure should be treated with medicines, if weight loss and exercise are not effective. If you are 33-66 years old, ask your health care provider if you should take aspirin to prevent heart disease. Diabetes screening involves taking a blood sample to check your fasting blood sugar level. Testing should be considered at a younger age or be carried out more frequently if you are overweight and have at least 1 risk factor for diabetes. Colorectal cancer can be detected and often prevented. Most routine colorectal cancer screening begins at the age of 21 and continues through age 56. However, your health care provider may recommend screening at an earlier age if you have risk factors for colon cancer. On a yearly basis, your health care provider may provide home test kits to check for hidden blood in the stool. Use of a small camera at the end of a tube to directly examine the colon (sigmoidoscopy or colonoscopy) can detect the earliest forms of colorectal cancer. Talk to your health care provider about this at age 47, when routine screening begins. Direct exam of the colon should be repeated every 5-10 years through age 10, unless early forms of precancerous polyps or small growths are found. Hepatitis C blood testing is recommended for all people born from [birth year] through 1967 and any individual with known risks for hepatitis C. Screening for abdominal aortic aneurysm (AAA)  by ultrasound is recommended for people who have history of high blood pressure or who are current or former smokers. Healthy men should  receive prostate-specific antigen (PSA) blood tests as part of routine cancer screening.  Talk with your health care provider about prostate cancer screening. Testicular cancer screening is  recommended for adult males. Screening includes self-exam, a health care provider exam, and other screening tests. Consult with your health care provider about any symptoms you have or any concerns you have about testicular cancer. Use sunscreen. Apply sunscreen liberally and repeatedly throughout the day. You should seek shade when your shadow is shorter than  you. Protect yourself by wearing long sleeves, pants, a wide-brimmed hat, and sunglasses year round, whenever you are outdoors. Once a month, do a whole-body skin exam, using a mirror to look at the skin on your back. Tell your health care provider about new moles, moles that have irregular borders, moles that are larger than a pencil eraser, or moles that have changed in shape or color. Stay current with required vaccines (immunizations). Influenza vaccine. All adults should be immunized every year. Tetanus, diphtheria, and acellular pertussis (Td, Tdap) vaccine. An adult who has not previously received Tdap or who does not know his vaccine status should receive 1 dose of Tdap. This initial dose should be followed by tetanus and diphtheria toxoids (Td) booster doses every 10 years. Adults with an unknown or incomplete history of completing a 3-dose immunization series with Td-containing vaccines should begin or complete a primary immunization series including a Tdap dose. Adults should receive a Td booster every 10 years. Zoster vaccine. One dose is recommended for adults aged 60 years or older unless certain conditions are present.  PREVNAR - Pneumococcal 13-valent conjugate (PCV13) vaccine. When indicated, a person who is uncertain of his immunization history and has no record of immunization should receive the PCV13 vaccine. An adult aged 19 years or older who has certain medical conditions and has not been previously immunized should receive 1  dose of PCV13 vaccine. This PCV13 should be followed with a dose of pneumococcal polysaccharide (PPSV23) vaccine. The PPSV23 vaccine dose should be obtained 1 or more year(s)after the dose of PCV13 vaccine. An adult aged 19 years or older who has certain medical conditions and previously received 1 or more doses of PPSV23 vaccine should receive 1 dose of PCV13. The PCV13 vaccine dose should be obtained 1 or more years after the last PPSV23 vaccine dose.  PNEUMOVAX - Pneumococcal polysaccharide (PPSV23) vaccine. When PCV13 is also indicated, PCV13 should be obtained first. All adults aged 65 years and older should be immunized. An adult younger than age 65 years who has certain medical conditions should be immunized. Any person who resides in a nursing home or long-term care facility should be immunized. An adult smoker should be immunized. People with an immunocompromised condition and certain other conditions should receive both PCV13 and PPSV23 vaccines. People with human immunodeficiency virus (HIV) infection should be immunized as soon as possible after diagnosis. Immunization during chemotherapy or radiation therapy should be avoided. Routine use of PPSV23 vaccine is not recommended for American Indians, [place] Natives, or people younger than 65 years unless there are medical conditions that require PPSV23 vaccine. When indicated, people who have unknown immunization and have no record of immunization should receive PPSV23 vaccine. One-time revaccination 5 years after the first dose of PPSV23 is recommended for people aged 19-64 years who have chronic kidney failure, nephrotic syndrome, asplenia, or immunocompromised conditions. People who received 1-2 doses of PPSV23 before age 65 years should receive another dose of PPSV23 vaccine at age 65 years or later if at least 5 years have passed since the previous dose. Doses of PPSV23 are not needed for people immunized with PPSV23 at or after age 65  years.  Hepatitis A vaccine. Adults who wish to be protected from this disease, have certain high-risk conditions, work with hepatitis A-infected animals, work in hepatitis A research labs, or travel to or work in countries with a high rate of hepatitis A should be immunized. Adults who were previously unvaccinated and who anticipate close contact   with an international adoptee during the first 60 days after arrival in the [place] from a country with a high rate of hepatitis A should be immunized.  Hepatitis B vaccine. Adults should be immunized if they wish to be protected from this disease, have certain high-risk conditions, may be exposed to blood or other infectious body fluids, are household contacts or sex partners of hepatitis B positive people, are clients or workers in certain care facilities, or travel to or work in countries with a high rate of hepatitis B.  Preventive Service / Frequency  Ages 62 and over Blood pressure check. Lipid and cholesterol check. Lung cancer screening. / Every year if you are aged 2-80 years and have a 30-pack-year history of smoking and currently smoke or have quit within the past 15 years. Yearly screening is stopped once you have quit smoking for at least 15 years or develop a health problem that would prevent you from having lung cancer treatment. Fecal occult blood test (FOBT) of stool. You may not have to do this test if you get a colonoscopy every 10 years. Flexible sigmoidoscopy** or colonoscopy.** / Every 5 years for a flexible sigmoidoscopy or every 10 years for a colonoscopy beginning at age 44 and continuing until age 49. Hepatitis C blood test.** / For all people born from [birth year] through 1967 and any individual with known risks for hepatitis C. Abdominal aortic aneurysm (AAA) screening./ Screening current or former smokers or have Hypertension. Skin self-exam. / Monthly. Influenza vaccine. / Every year. Tetanus, diphtheria, and acellular  pertussis (Tdap/Td) vaccine.** / 1 dose of Td every 10 years.  Zoster vaccine.** / 1 dose for adults aged 44 years or older.         Pneumococcal 13-valent conjugate (PCV13) vaccine.   Pneumococcal polysaccharide (PPSV23) vaccine.   Hepatitis A vaccine.** / Consult your health care provider. Hepatitis B vaccine.** / Consult your health care provider. Screening for abdominal aortic aneurysm (AAA)  by ultrasound is recommended for people who have history of high blood pressure or who are current or former smokers. ++++++++++ Recommend Adult Low Dose Aspirin or  coated  Aspirin 81 mg daily  To reduce risk of Colon Cancer 40 %,  Skin Cancer 26 % ,  Malignant Melanoma 46%  and  Pancreatic cancer 60% ++++++++++++++++++++++ Vitamin D goal  is between 70-100.  Please make sure that you are taking your Vitamin D as directed.  It is very important as a natural anti-inflammatory  helping hair, skin, and nails, as well as reducing stroke and heart attack risk.  It helps your bones and helps with mood. It also decreases numerous cancer risks so please take it as directed.  Low Vit D is associated with a 200-300% higher risk for CANCER  and 200-300% higher risk for HEART   ATTACK  &  STROKE.   .....................................Marland Kitchen It is also associated with higher death rate at younger ages,  autoimmune diseases like Rheumatoid arthritis, Lupus, Multiple Sclerosis.    Also many other serious conditions, like depression, Alzheimer's Dementia, infertility, muscle aches, fatigue, fibromyalgia - just to name a few. ++++++++++++++++++++++ Recommend the book "The END of DIETING" by Dr Excell Seltzer  & the book "The END of DIABETES " by Dr Excell Seltzer At [email or web address] - get book & Audio CD's    Being diabetic has a  300% increased risk for heart attack, stroke, cancer, and alzheimer- type vascular dementia. It is very important that you work harder with diet by  avoiding all foods that are white. Avoid  white rice (brown & wild rice is OK), white potatoes (sweetpotatoes in moderation is OK), White bread or wheat bread or anything made out of white flour like bagels, donuts, rolls, buns, biscuits, cakes, pastries, cookies, pizza crust, and pasta (made from white flour & egg whites) - vegetarian pasta or spinach or wheat pasta is OK. Multigrain breads like Arnold's or [employer], or multigrain sandwich thins or flatbreads.  Diet, exercise and weight loss can reverse and cure diabetes in the early stages.  Diet, exercise and weight loss is very important in the control and prevention of complications of diabetes which affects every system in your body, ie. Brain - dementia/stroke, eyes - glaucoma/blindness, heart - heart attack/heart failure, kidneys - dialysis, stomach - gastric paralysis, intestines - malabsorption, nerves - severe painful neuritis, circulation - gangrene & loss of a leg(s), and finally cancer and Alzheimers.    I recommend avoid fried & greasy foods,  sweets/candy, white rice (brown or wild rice or Quinoa is OK), white potatoes (sweet potatoes are OK) - anything made from white flour - bagels, doughnuts, rolls, buns, biscuits,white and wheat breads, pizza crust and traditional pasta made of white flour & egg white(vegetarian pasta or spinach or wheat pasta is OK).  Multi-grain bread is OK - like multi-grain flat bread or sandwich thins. Avoid alcohol in excess. Exercise is also important.    Eat all the vegetables you want - avoid meat, especially red meat and dairy - especially cheese.  Cheese is the most concentrated form of trans-fats which is the worst thing to clog up our arteries. Veggie cheese is OK which can be found in the fresh produce section at [employer] or [employer] or [employer]  ++++++++++++++++++++++ DASH Eating Plan  DASH stands for "Dietary Approaches to Stop Hypertension."   The DASH eating plan is a healthy eating plan that has been shown to reduce high  blood pressure (hypertension). Additional health benefits may include reducing the risk of type 2 diabetes mellitus, heart disease, and stroke. The DASH eating plan may also help with weight loss. WHAT DO I NEED TO KNOW ABOUT THE DASH EATING PLAN? For the DASH eating plan, you will follow these general guidelines: Choose foods with a percent daily value for sodium of less than 5% (as listed on the food label). Use salt-free seasonings or herbs instead of table salt or sea salt. Check with your health care provider or pharmacist before using salt substitutes. Eat lower-sodium products, often labeled as "lower sodium" or "no salt added." Eat fresh foods. Eat more vegetables, fruits, and low-fat dairy products. Choose whole grains. Look for the word "whole" as the first word in the ingredient list. Choose fish  Limit sweets, desserts, sugars, and sugary drinks. Choose heart-healthy fats. Eat veggie cheese  Eat more home-cooked food and less restaurant, buffet, and fast food. Limit fried foods. Cook foods using methods other than frying. Limit canned vegetables. If you do use them, rinse them well to decrease the sodium. When eating at a restaurant, ask that your food be prepared with less salt, or no salt if possible.                      WHAT FOODS CAN I EAT? Read Dr Fara Olden Fuhrman's books on The End of Dieting & The End of Diabetes  Grains Whole grain or whole wheat bread. Brown rice. Whole grain or whole wheat pasta. Quinoa, bulgur, and  whole grain cereals. Low-sodium cereals. Corn or whole wheat flour tortillas. Whole grain cornbread. Whole grain crackers. Low-sodium crackers.  Vegetables Fresh or frozen vegetables (raw, steamed, roasted, or grilled). Low-sodium or reduced-sodium tomato and vegetable juices. Low-sodium or reduced-sodium tomato sauce and paste. Low-sodium or reduced-sodium canned vegetables.   Fruits All fresh, canned (in natural juice), or frozen fruits.  Protein  Products  All fish and seafood.  Dried beans, peas, or lentils. Unsalted nuts and seeds. Unsalted canned beans.  Dairy Low-fat dairy products, such as skim or 1% milk, 2% or reduced-fat cheeses, low-fat ricotta or cottage cheese, or plain low-fat yogurt. Low-sodium or reduced-sodium cheeses.  Fats and Oils Tub margarines without trans fats. Light or reduced-fat mayonnaise and salad dressings (reduced sodium). Avocado. Safflower, olive, or canola oils. Natural peanut or almond butter.  Other Unsalted popcorn and pretzels. The items listed above may not be a complete list of recommended foods or beverages. Contact your dietitian for more options.  ++++++++++++++++++++  WHAT FOODS ARE NOT RECOMMENDED? Grains/ White flour or wheat flour White bread. White pasta. White rice. Refined cornbread. Bagels and croissants. Crackers that contain trans fat.  Vegetables  Creamed or fried vegetables. Vegetables in a . Regular canned vegetables. Regular canned tomato sauce and paste. Regular tomato and vegetable juices.  Fruits Dried fruits. Canned fruit in light or heavy syrup. Fruit juice.  Meat and Other Protein Products Meat in general - RED meat & White meat.  Fatty cuts of meat. Ribs, chicken wings, all processed meats as bacon, sausage, bologna, salami, fatback, hot dogs, bratwurst and packaged luncheon meats.  Dairy Whole or 2% milk, cream, half-and-half, and cream cheese. Whole-fat or sweetened yogurt. Full-fat cheeses or blue cheese. Non-dairy creamers and whipped toppings. Processed cheese, cheese spreads, or cheese curds.  Condiments Onion and garlic salt, seasoned salt, table salt, and sea salt. Canned and packaged gravies. Worcestershire sauce. Tartar sauce. Barbecue sauce. Teriyaki sauce. Soy sauce, including reduced sodium. Steak sauce. Fish sauce. Oyster sauce. Cocktail sauce. Horseradish. Ketchup and mustard. Meat flavorings and tenderizers. Bouillon cubes. Hot sauce. Tabasco sauce.  Marinades. Taco seasonings. Relishes.  Fats and Oils Butter, stick margarine, lard, shortening and bacon fat. Coconut, palm kernel, or palm oils. Regular salad dressings.  Pickles and olives. Salted popcorn and pretzels.  The items listed above may not be a complete list of foods and beverages to avoid.   

## 2022-09-06 NOTE — Progress Notes (Signed)
Comprehensive Evaluation & Examination   Future Appointments  Date Time Provider Department  09/06/2022               cpe              3:00 PM Unk Pinto, MD [facility]  04/13/2023  3:00 PM Alycia Rossetti, NP [facility]  09/12/2023                 cpe  3:00 PM Unk Pinto, MD [facility]            This very nice 77 y.o. MWM presents for a comprehensive evaluation and management of multiple medical co-morbidities.  Patient has been followed for HTN, HLD, Prediabetes and Vitamin D Deficiency. Patient is on meds for GERD & Gout .        HTN predates circa 1995.  In 2019, patient presented with ACS and underwent  PCA w/Stent and also had pAfib. In Feb 2021 , he had PPM implanted for SSS. Patient has CKD3a (GFR 56) felt consequent of his HTVD.  He is followed by Dr Corliss Parish for his kidney dsease. Patient's BP has been controlled at home.  Today's BP was initially sl elevated & rechecked at 134/82. Patient denies any cardiac symptoms as chest pain, palpitations, shortness of breath, dizziness or ankle swelling.       Patient's hyperlipidemia is controlled with diet and Atorvastatin. Patient denies myalgias or other medication SE's. Last lipids were at goal :  Lab Results  Component Value Date   CHOL 122 04/12/2022   HDL 39 (L) 04/12/2022   LDLCALC 61 04/12/2022   TRIG 140 04/12/2022   CHOLHDL 3.1 04/12/2022         Patient is monitored for glucose intolerance  and patient denies reactive hypoglycemic symptoms, visual blurring, diabetic polys or paresthesias. Last A1c was normal & at goal :   Lab Results  Component Value Date   HGBA1C 5.4 04/12/2022         Finally, patient has history of Vitamin D Deficiency of    and last vitamin D was at goal :   Lab Results  Component Value Date   VD25OH 74 09/01/2021       Current Outpatient Medications on File Prior to Visit  Medication Sig   acetaminophen 500 MG tablet Take  every 6 (six) hours as needed.    aspirin EC 81 MG tablet Take 1 tablet Daily   atorvastatin 80 MG tablet Take  1 tablet  Daily  for Cholesterol   VITAMIN D 2000 units tablet Take 8,000 Units every morning.    diazepam (VALIUM) 5 MG tablet Take  1 tablet  3 x /day  for Tremor   isosorbide mononitrate 30 MG 24 hr TAKE 1 TABLET  DAILY   losartan 50 MG tablet Take 50 mg by mouth.   MAGNESIUM 250 mg gummy  Takes  twice a day.   metoprolol succinate-XL) 25 MG 24 hr tablet Take 1 tablet daily.   Minoxidil 5 % FOAM Apply 1 application topically daily.   NITROSTAT 0.4 MG SL tablet as needed. For chest pain   Omega-3 FISH OIL 1200 MG  Take 2 capsules  at bedtime.    DITROPAN-XL 10 MG 24 hr tablet TAKE 1 TABLET TWO TIMES DAILY   pantoprazole 40 MG tablet Take daily.   traMADol  50 MG tablet Take  1/2 to 1 tablet  every 4 hours  Only  if Needed  for Severe Pain   triamcinolone ointment ( 0.1 % Apply 3 times daily.   vitamin B-12  1000 MCG tablet Take  daily.     Allergies  Allergen Reactions   Enalapril Rash   Nsaids     Internal bleeding   Welchol [Colesevelam Hcl] Diarrhea   Vasotec [Enalaprilat] Rash     Past Medical History:  Diagnosis Date   CAD (coronary artery disease)    Essential tremor    Gastric ulcer 01/14/2015   Gout    Hyperlipidemia    Hypertension    Parkinson's disease ([place])    Prediabetes    Proteinuria    Vitamin D deficiency      Health Maintenance  Topic Date Due   Zoster Vaccines- Shingrix (1 of 2) Never done   COVID-19 Vaccine (4 - Booster for Pfizer series) 08/05/2020   Hepatitis C Screening  03/23/2022 (Originally 03/09/1964)   TETANUS/TDAP  01/29/2024   Pneumonia Vaccine  Completed   INFLUENZA VACCINE  Completed   HPV VACCINES  Aged Out   COLONOSCOPY  Discontinued     Immunization History  Administered Date(s) Administered   Influenza Split 05/23/2013   Influenza, High Dose 05/19/2016, 06/10/2017, 05/20/2019   Influenza 04/22/2020   PFIZER SARS-COV-2 Vacc 10/11/2019,  11/01/2019, 06/10/2020   Pneumococcal -13 12/03/2014   Pneumococcal -23 06/01/2010   Td 06/01/2010   Tdap 01/28/2014   Zoster, Live 08/08/2004     Last Colon -  67/28/2013 Dr Arty Baumgartner     Past Surgical History:  Procedure Laterality Date   CHOLECYSTECTOMY     CYST EXCISION     back   KNEE SURGERY Right    TONSILLECTOMY       Social History   Tobacco Use   Smoking status: Never   Smokeless tobacco: Never  Vaping Use   Vaping Use: Never used  Substance Use Topics   Alcohol use: Not Currently    Alcohol/week: 14.0 standard drinks    Types: 14 Standard drinks or equivalent per week   Drug use: No      ROS Constitutional: Denies fever, chills, weight loss/gain, headaches, insomnia,  night sweats or change in appetite. Does c/o fatigue. Eyes: Denies redness, blurred vision, diplopia, discharge, itchy or watery eyes.  ENT: Denies discharge, congestion, post nasal drip, epistaxis, sore throat, earache, hearing loss, dental pain, Tinnitus, Vertigo, Sinus pain or snoring.  Cardio: Denies chest pain, palpitations, irregular heartbeat, syncope, dyspnea, diaphoresis, orthopnea, PND, claudication or edema Respiratory: denies cough, dyspnea, DOE, pleurisy, hoarseness, laryngitis or wheezing.  Gastrointestinal: Denies dysphagia, heartburn, reflux, water brash, pain, cramps, nausea, vomiting, bloating, diarrhea, constipation, hematemesis, melena, hematochezia, jaundice or hemorrhoids Genitourinary: Denies dysuria, frequency, urgency, nocturia, hesitancy, discharge, hematuria or flank pain Musculoskeletal: Denies arthralgia, myalgia, stiffness, Jt. Swelling, pain, limp or strain/sprain. Denies Falls. Skin: Denies puritis, rash, hives, warts, acne, eczema or change in skin lesion Neuro: No weakness, tremor, incoordination, spasms, paresthesia or pain Psychiatric: Denies confusion, memory loss or sensory loss. Denies Depression. Endocrine: Denies change in weight, skin, hair change,  nocturia, and paresthesia, diabetic polys, visual blurring or hyper / hypo glycemic episodes.  Heme/Lymph: No excessive bleeding, bruising or enlarged lymph nodes.   Physical Exam  BP (!) 148/82   Pulse 86   Temp 97.7 F (36.5 C)   Resp 16   Ht 6' 0.75" (1.848 m)   Wt 202 lb 9.6 oz (91.9 kg)   SpO2 97%   BMI 26.91 kg/m   General Appearance: Well  nourished and well groomed and in no apparent distress.  Eyes: PERRLA, EOMs, conjunctiva no swelling or erythema, normal fundi and vessels. Sinuses: No frontal/maxillary tenderness ENT/Mouth: EACs patent / TMs  nl. Nares clear without erythema, swelling, mucoid exudates. Oral hygiene is good. No erythema, swelling, or exudate. Tongue normal, non-obstructing. Tonsils not swollen or erythematous. Hearing normal.  Neck: Supple, thyroid not palpable. No bruits, nodes or JVD. Respiratory: Respiratory effort normal.  BS equal and clear bilateral without rales, rhonci, wheezing or stridor. Cardio: Heart sounds are normal with regular rate and rhythm and no murmurs, rubs or gallops. Peripheral pulses are normal and equal bilaterally without edema. No aortic or femoral bruits. Chest: symmetric with normal excursions and percussion.  Abdomen: Soft, with Nl bowel sounds. Nontender, no guarding, rebound, hernias, masses, or organomegaly.  Lymphatics: Non tender without lymphadenopathy.  Musculoskeletal: Full ROM all peripheral extremities, joint stability, 5/5 strength, and normal gait. Skin: Warm and dry without rashes, lesions, cyanosis, clubbing or  ecchymosis.  Neuro: Cranial nerves intact, reflexes equal bilaterally. Normal muscle tone, no cerebellar symptoms. Sensation intact.  Pysch: Alert and oriented X 3 with normal affect, insight and judgment appropriate.   Assessment and Plan  1. Essential hypertension  - EKG 12-Lead - Urinalysis, Routine w reflex microscopic - Microalbumin / creatinine urine ratio - CBC with Differential/Platelet -  COMPLETE METABOLIC PANEL WITH GFR - Magnesium - TSH  2. Hyperlipidemia, mixed  - EKG 12-Lead - Lipid panel - TSH  3. Abnormal glucose  - EKG 12-Lead - Hemoglobin A1c - Insulin, random  4. Vitamin D de 5. S/P coronary artery stent placement  - EKG 12-Lead - Lipid panel  6. Coronary artery disease involving native coronary artery of native heart without angina pectoris  - EKG 12-Lead - Lipid panel  7. Essential tremor   8. Sick sinus syndrome (HCC)  - EKG 12-Lead  9. Presence of permanent cardiac pacemaker  - EKG 12-Lead  10. Idiopathic gout   11. BPH with obstruction/lower urinary tract symptoms  - PSA  12. Screening for colorectal cancer  - POC Hemoccult Bld/Stl  13. Prostate cancer screening  - PSA  14. Screening for heart disease  - EKG 12-Lead  15. FHx: heart disease  - EKG 12-Lead  16. Medication management  - Urinalysis, Routine w reflex microscopic - Microalbumin / creatinine urine ratio - CBC with Differential/Platelet - COMPLETE METABOLIC PANEL WITH GFR - Magnesium - Lipid panel - TSH - Hemoglobin A1c - Insulin, random - VITAMIN D 25 Hydroxy - Hepatic function panel         Patient was counseled in prudent diet, weight control to achieve/maintain BMI less than 25, BP monitoring, regular exercise and medications as discussed.  Discussed med effects and SE's. Routine screening labs and tests as requested with regular follow-up as recommended. Over 40 minutes of exam, counseling, chart review and high complex critical decision making was performed   Kirtland Bouchard, MD

## 2022-09-07 ENCOUNTER — Encounter: Payer: Self-pay | Admitting: Internal Medicine

## 2022-09-07 LAB — CBC WITH DIFFERENTIAL/PLATELET
Absolute Monocytes: 357 cells/uL (ref 200–950)
Basophils Absolute: 22 cells/uL (ref 0–200)
Basophils Relative: 0.5 %
Eosinophils Absolute: 189 cells/uL (ref 15–500)
Eosinophils Relative: 4.4 %
HCT: 41.3 % (ref 38.5–50.0)
Hemoglobin: 14.1 g/dL (ref 13.2–17.1)
Lymphs Abs: 1574 cells/uL (ref 850–3900)
MCH: 33.6 pg — ABNORMAL HIGH (ref 27.0–33.0)
MCHC: 34.1 g/dL (ref 32.0–36.0)
MCV: 98.3 fL (ref 80.0–100.0)
MPV: 10.2 fL (ref 7.5–12.5)
Monocytes Relative: 8.3 %
Neutro Abs: 2159 cells/uL (ref 1500–7800)
Neutrophils Relative %: 50.2 %
Platelets: 139 10*3/uL — ABNORMAL LOW (ref 140–400)
RBC: 4.2 10*6/uL (ref 4.20–5.80)
RDW: 12.5 % (ref 11.0–15.0)
Total Lymphocyte: 36.6 %
WBC: 4.3 10*3/uL (ref 3.8–10.8)

## 2022-09-07 LAB — URINALYSIS, ROUTINE W REFLEX MICROSCOPIC
Bacteria, UA: NONE SEEN /HPF
Bilirubin Urine: NEGATIVE
Glucose, UA: NEGATIVE
Hgb urine dipstick: NEGATIVE
Hyaline Cast: NONE SEEN /LPF
Ketones, ur: NEGATIVE
Leukocytes,Ua: NEGATIVE
Nitrite: NEGATIVE
RBC / HPF: NONE SEEN /HPF (ref 0–2)
Specific Gravity, Urine: 1.011 (ref 1.001–1.035)
Squamous Epithelial / HPF: NONE SEEN /HPF (ref <=5)
WBC, UA: NONE SEEN /HPF (ref 0–5)
pH: 6 (ref 5.0–8.0)

## 2022-09-07 LAB — LIPID PANEL
Cholesterol: 114 mg/dL (ref <200)
HDL: 42 mg/dL (ref >=40)
LDL Cholesterol (Calc): 59 mg/dL (calc)
Non-HDL Cholesterol (Calc): 72 mg/dL (calc) (ref <130)
Total CHOL/HDL Ratio: 2.7 (calc) (ref <5.0)
Triglycerides: 58 mg/dL (ref <150)

## 2022-09-07 LAB — COMPLETE METABOLIC PANEL WITH GFR
AG Ratio: 1.5 (calc) (ref 1.0–2.5)
ALT: 18 U/L (ref 9–46)
AST: 20 U/L (ref 10–35)
Albumin: 4 g/dL (ref 3.6–5.1)
Alkaline phosphatase (APISO): 50 U/L (ref 35–144)
BUN: 20 mg/dL (ref 7–25)
CO2: 30 mmol/L (ref 20–32)
Calcium: 10.1 mg/dL (ref 8.6–10.3)
Chloride: 105 mmol/L (ref 98–110)
Creat: 1.22 mg/dL (ref 0.70–1.28)
Globulin: 2.6 g/dL (calc) (ref 1.9–3.7)
Glucose, Bld: 84 mg/dL (ref 65–99)
Potassium: 5.3 mmol/L (ref 3.5–5.3)
Sodium: 143 mmol/L (ref 135–146)
Total Bilirubin: 0.8 mg/dL (ref 0.2–1.2)
Total Protein: 6.6 g/dL (ref 6.1–8.1)
eGFR: 61 mL/min/{1.73_m2} (ref >=60)

## 2022-09-07 LAB — TSH: TSH: 2.06 mIU/L (ref 0.40–4.50)

## 2022-09-07 LAB — MICROALBUMIN / CREATININE URINE RATIO
Creatinine, Urine: 54 mg/dL (ref 20–320)
Microalb Creat Ratio: 1991 mcg/mg creat — ABNORMAL HIGH (ref <30)
Microalb, Ur: 107.5 mg/dL

## 2022-09-07 LAB — MAGNESIUM: Magnesium: 1.7 mg/dL (ref 1.5–2.5)

## 2022-09-07 LAB — HEPATIC FUNCTION PANEL
AG Ratio: 1.5 (calc) (ref 1.0–2.5)
ALT: 18 U/L (ref 9–46)
AST: 20 U/L (ref 10–35)
Albumin: 4 g/dL (ref 3.6–5.1)
Alkaline phosphatase (APISO): 50 U/L (ref 35–144)
Bilirubin, Direct: 0.2 mg/dL (ref 0.0–0.2)
Globulin: 2.6 g/dL (calc) (ref 1.9–3.7)
Indirect Bilirubin: 0.6 mg/dL (calc) (ref 0.2–1.2)
Total Bilirubin: 0.8 mg/dL (ref 0.2–1.2)
Total Protein: 6.6 g/dL (ref 6.1–8.1)

## 2022-09-07 LAB — HEMOGLOBIN A1C
Hgb A1c MFr Bld: 5.5 % of total Hgb (ref <5.7)
Mean Plasma Glucose: 111 mg/dL
eAG (mmol/L): 6.2 mmol/L

## 2022-09-07 LAB — MICROSCOPIC MESSAGE

## 2022-09-07 LAB — PSA: PSA: 0.88 ng/mL (ref <=4.00)

## 2022-09-07 LAB — INSULIN, RANDOM: Insulin: 8 u[IU]/mL

## 2022-09-07 LAB — VITAMIN D 25 HYDROXY (VIT D DEFICIENCY, FRACTURES): Vit D, 25-Hydroxy: 69 ng/mL (ref 30–100)

## 2022-09-07 NOTE — Progress Notes (Signed)
<><><><><><><><><><><><><><><><><><><><><><><><><><><><><><><><><> <><><><><><><><><><><><><><><><><><><><><><><><><><><><><><><><><> -   Test results slightly outside the reference range are not unusual. If there is anything important, I will review this with you,  otherwise it is considered normal test values.  If you have further questions,  please do not hesitate to contact me at the office or via My Chart.  <><><><><><><><><><><><><><><><><><><><><><><><><><><><><><><><><> <><><><><><><><><><><><><><><><><><><><><><><><><><><><><><><><><>  -  PSA - Low - Great- No Prostate cancer . <><><><><><><><><><><><><><><><><><><><><><><><><><><><><><><><><>  -   Magnesium  -= 1.7  is  very  low- goal is betw 2.0 - 2.5,   - So..............Marland Kitchen  Recommend that you take  Magnesium 500 mg tablet daily   - also important to eat lots of  leafy green vegetables   - spinach - Kale - collards - greens - okra - asparagus  - broccoli - quinoa - squash - almonds   - black, red, white beans  -  peas - green beans <><><><><><><><><><><><><><><><><><><><><><><><><><><><><><><><><>  -  Total Chol = 114   &   LDL = 59   Excellent   - Very low risk for Heart Attack  / Stroke <><><><><><><><><><><><><><><><><><><><><><><><><><><><><><><><><>  -  A1c - Normal - No Diabetes  - Great !  <><><><><><><><><><><><><><><><><><><><><><><><><><><><><><><><><>  -  Vitamin D = 69 - Excellent  - Please keep dose same <><><><><><><><><><><><><><><><><><><><><><><><><><><><><><><><><>  -  All Else - CBC - Kidneys - Electrolytes - Liver - Magnesium & Thyroid    - all  Normal / OK <><><><><><><><><><><><><><><><><><><><><><><><><><><><><><><><><> <><><><><><><><><><><><><><><><><><><><><><><><><><><><><><><><><>  -   -

## 2022-09-12 NOTE — Progress Notes (Signed)
-   Please send Lab Letter for [facility] Results

## 2022-09-13 ENCOUNTER — Encounter: Payer: Self-pay | Admitting: Internal Medicine

## 2022-09-13 ENCOUNTER — Other Ambulatory Visit: Payer: Self-pay | Admitting: Internal Medicine

## 2022-09-18 ENCOUNTER — Encounter: Payer: Self-pay | Admitting: Internal Medicine

## 2022-11-16 DIAGNOSIS — Z1330 Encounter for screening examination for mental health and behavioral disorders, unspecified: Secondary | ICD-10-CM | POA: Diagnosis not present

## 2022-11-16 DIAGNOSIS — M65332 Trigger finger, left middle finger: Secondary | ICD-10-CM | POA: Diagnosis not present

## 2022-11-18 DIAGNOSIS — I2510 Atherosclerotic heart disease of native coronary artery without angina pectoris: Secondary | ICD-10-CM | POA: Diagnosis not present

## 2022-11-18 DIAGNOSIS — R809 Proteinuria, unspecified: Secondary | ICD-10-CM | POA: Diagnosis not present

## 2022-11-18 DIAGNOSIS — R351 Nocturia: Secondary | ICD-10-CM | POA: Diagnosis not present

## 2022-11-18 DIAGNOSIS — E785 Hyperlipidemia, unspecified: Secondary | ICD-10-CM | POA: Diagnosis not present

## 2022-11-18 DIAGNOSIS — I129 Hypertensive chronic kidney disease with stage 1 through stage 4 chronic kidney disease, or unspecified chronic kidney disease: Secondary | ICD-10-CM | POA: Diagnosis not present

## 2022-11-20 DIAGNOSIS — I495 Sick sinus syndrome: Secondary | ICD-10-CM | POA: Diagnosis not present

## 2022-11-23 ENCOUNTER — Other Ambulatory Visit: Payer: Self-pay | Admitting: Internal Medicine

## 2022-11-23 DIAGNOSIS — G250 Essential tremor: Secondary | ICD-10-CM

## 2022-11-29 ENCOUNTER — Encounter: Payer: Self-pay | Admitting: Internal Medicine

## 2022-12-06 ENCOUNTER — Encounter: Payer: Self-pay | Admitting: Internal Medicine

## 2022-12-09 ENCOUNTER — Other Ambulatory Visit: Payer: Self-pay | Admitting: Internal Medicine

## 2022-12-16 ENCOUNTER — Ambulatory Visit (INDEPENDENT_AMBULATORY_CARE_PROVIDER_SITE_OTHER): Payer: Medicare Other

## 2022-12-16 ENCOUNTER — Ambulatory Visit
Admission: RE | Admit: 2022-12-16 | Discharge: 2022-12-16 | Disposition: A | Discharge status: Home / Self Care | Payer: Medicare Other | Source: Ambulatory Visit | Attending: Family Medicine | Admitting: Family Medicine

## 2022-12-16 VITALS — BP 111/73 | HR 72 | Temp 97.6°F | Resp 16 | Ht 72.0 in | Wt 191.0 lb

## 2022-12-16 DIAGNOSIS — M545 Low back pain, unspecified: Secondary | ICD-10-CM | POA: Diagnosis not present

## 2022-12-16 DIAGNOSIS — M5416 Radiculopathy, lumbar region: Secondary | ICD-10-CM

## 2022-12-16 MED ORDER — PREDNISONE 20 MG PO TABS: ORAL_TABLET | ORAL | 0 refills | Status: DC

## 2022-12-16 NOTE — Discharge Instructions (Signed)
Apply ice pack for to lower spine for 15 to 20 minutes, 2 to 3 times daily  Continue until pain decreases.  May continue tramadol as needed.

## 2022-12-16 NOTE — ED Triage Notes (Signed)
Per pt - left hip pain  Pt had hurt the left hip/leg on 4/26 working on his tractor  Per pt, "Lifting a [employer] package while standing, jerked and felt pain in leg.  Happened May 1st.  Iced it at first but still hurts to walk and aches now while sitting." Pt can not take NSAIDs Here w/ wife

## 2022-12-16 NOTE — ED Provider Notes (Signed)
[facility]    CSN: [id number] Arrival date & time: 12/16/22  1107      History   Chief Complaint Chief Complaint  Patient presents with   Leg Pain    HPI Leroy Griffin is a 77 y.o. male.   About two weeks ago while bending over his tractor to remove the seat, he momentarily lost his balance as he was lifting the seat out. He abruptly righted himself, but immediately afterwards felt sharp pain in his left hip radiating down his left leg.  He rested for four days, improving significantly.  About 8 or 9 days ago while lifting a [employer] box, he briefly lost his balance and abruptly righted himself.  He again felt recurrent pain in his left lower back and left buttock.  He continues to have pain when walking and sitting.  He denies bowel or bladder dysfunction, and no saddle numbness. Patient states that he has a long history of lumbar spinal pathology, having been told in the distant past that he needed spinal fusion.  The history is provided by the patient and the spouse.  Back Pain Location:  Lumbar spine and gluteal region Quality:  Aching Radiates to:  Does not radiate Pain severity:  Moderate Pain is:  Same all the time Onset quality:  Sudden Duration:  5 days Timing:  Intermittent Progression:  Unchanged Chronicity:  Recurrent Context: lifting heavy objects   Relieved by:  Being still Worsened by:  Ambulation and sitting Ineffective treatments:  Cold packs Associated symptoms: no abdominal pain, no bladder incontinence, no bowel incontinence, no dysuria, no fever, no leg pain, no numbness, no paresthesias, no pelvic pain, no perianal numbness, no tingling and no weakness     Past Medical History:  Diagnosis Date   CAD (coronary artery disease)    Essential tremor    Gastric ulcer 01/14/2015   Gout    Hyperlipidemia    Hypertension    Parkinson's disease    Prediabetes    Proteinuria    Vitamin D deficiency     Patient Active Problem List   Diagnosis  Date Noted   S/P placement of cardiac pacemaker 11/18/2020   Other abnormalities of gait and mobility 01/17/2019   Depression due to physical illness 01/17/2019   Overactive bladder 01/11/2018   S/P drug eluting coronary stent placement 11/21/2017   CAD in native artery 11/20/2017   Anxiety 11/14/2017   Overweight (BMI 25.0-29.9) 08/26/2014   Sick sinus syndrome (HCC) 10/10/2013   Medication management 10/06/2013   Hyperlipidemia 08/27/2013   Essential tremor    Proteinuria    Other abnormal glucose    Gout    Vitamin D deficiency    Essential hypertension 10/10/2010    Past Surgical History:  Procedure Laterality Date   CHOLECYSTECTOMY     CYST EXCISION     back   KNEE SURGERY Right    PACEMAKER IMPLANT     TONSILLECTOMY         Home Medications    Prior to Admission medications   Medication Sig Start Date End Date Taking? Authorizing Provider  predniSONE (DELTASONE) 20 MG tablet Take one tab by mouth twice daily for 4 days, then one daily for 3 days. Take with food. 12/16/22  Yes Lattie Haw, MD  acetaminophen (TYLENOL) 500 MG tablet Take 500 mg by mouth every 6 (six) hours as needed.    [provider]  aspirin EC 81 MG tablet Take 1 tablet Daily 08/18/20  Lucky Cowboy, MD  atorvastatin (LIPITOR) 80 MG tablet TAKE 1 TABLET BY MOUTH EVERY DAY 07/05/22   Raynelle Dick, NP  Cholecalciferol (VITAMIN D) 2000 units tablet Take 8,000 Units by mouth every morning.     [provider]  diazepam (VALIUM) 5 MG tablet TAKE 1 TABLET 3 X /DAY FOR HEREDITARY TREMOR 11/23/22   Lucky Cowboy, MD  isosorbide mononitrate (IMDUR) 30 MG 24 hr tablet TAKE 1 TABLET(30 MG) BY MOUTH DAILY 11/06/18   [provider]  losartan (COZAAR) 50 MG tablet Take 50 mg by mouth. 05/10/19   [provider]  MAGNESIUM PO Take by mouth. Takes magnesium 250 mg gummy twice a day.    [provider]  metoprolol succinate (TOPROL-XL) 25 MG 24 hr tablet  Take 1 tablet by mouth daily. 11/21/17   [provider]  Minoxidil 5 % FOAM Apply 1 application topically daily. Patient not taking: Reported on 12/16/2022    [provider]  nitroGLYCERIN (NITROSTAT) 0.4 MG SL tablet Place 1 tablet under the tongue every 5 (five) minutes as needed. For chest pain 11/21/17   [provider]  Omega-3 Fatty Acids (FISH OIL) 1200 MG CPDR Take 2 capsules by mouth at bedtime.     [provider]  oxybutynin (DITROPAN-XL) 10 MG 24 hr tablet TAKE 1 TABLET BY MOUTH TWICE A DAY 07/05/22   Raynelle Dick, NP  pantoprazole (PROTONIX) 40 MG tablet Take 40 mg by mouth daily. 11/08/19   [provider]  traMADol (ULTRAM) 50 MG tablet TAKE 1/2 TO 1 TABLET EVERY 4 HOURS ONLY IF NEEDED FOR SEVERE PAIN 12/09/22   Lucky Cowboy, MD  triamcinolone ointment (KENALOG) 0.1 % Apply 1 application topically 3 (three) times daily. 06/18/20   Lucky Cowboy, MD  vitamin B-12 (CYANOCOBALAMIN) 1000 MCG tablet Take 1,000 mcg by mouth daily.    [provider]    Family History Family History  Problem Relation Age of Onset   Dementia Mother    Stroke Father    Emphysema Brother    Heart disease Brother     Social History Social History   Tobacco Use   Smoking status: Never   Smokeless tobacco: Never  Vaping Use   Vaping Use: Never used  Substance Use Topics   Alcohol use: Not Currently    Alcohol/week: 14.0 standard drinks of alcohol    Types: 14 Standard drinks or equivalent per week   Drug use: No     Allergies   Enalapril, Nsaids, Welchol [colesevelam hcl], and Vasotec [enalaprilat]   Review of Systems Review of Systems  Constitutional:  Positive for activity change. Negative for appetite change, chills, diaphoresis, fatigue and fever.  HENT: Negative.    Eyes: Negative.   Respiratory: Negative.    Cardiovascular: Negative.   Gastrointestinal:  Negative for abdominal pain and bowel incontinence.   Genitourinary:  Negative for bladder incontinence, dysuria, flank pain and pelvic pain.  Musculoskeletal:  Positive for back pain and gait problem.  Skin: Negative.   Neurological:  Negative for tingling, weakness, numbness and paresthesias.     Physical Exam Triage Vital Signs ED Triage Vitals  Enc Vitals Group     BP 12/16/22 1129 111/73     Pulse Rate 12/16/22 1129 72     Resp 12/16/22 1129 16     Temp 12/16/22 1129 97.6 F (36.4 C)     Temp Source 12/16/22 1129 Oral     SpO2 12/16/22 1129 99 %  Weight 12/16/22 1132 191 lb (86.6 kg)     Height 12/16/22 1132 6' (1.829 m)     Head Circumference --      Peak Flow --      Pain Score 12/16/22 1132 4     Pain Loc --      Pain Edu? --      Excl. in GC? --    No data found.  Updated Vital Signs BP 111/73 (BP Location: Left Arm)   Pulse 72   Temp 97.6 F (36.4 C) (Oral)   Resp 16   Ht 6' (1.829 m)   Wt 86.6 kg   SpO2 99%   BMI 25.90 kg/m   Visual Acuity Right Eye Distance:   Left Eye Distance:   Bilateral Distance:    Right Eye Near:   Left Eye Near:    Bilateral Near:     Physical Exam Vitals and nursing note reviewed.  Constitutional:      General: He is not in acute distress. HENT:     Head: Normocephalic.     Mouth/Throat:     Mouth: Mucous membranes are moist.  Eyes:     Conjunctiva/sclera: Conjunctivae normal.     Pupils: Pupils are equal, round, and reactive to light.  Cardiovascular:     Rate and Rhythm: Normal rate and regular rhythm.     Heart sounds: Normal heart sounds.  Pulmonary:     Breath sounds: Normal breath sounds.  Abdominal:     Palpations: Abdomen is soft.     Tenderness: There is no abdominal tenderness.  Musculoskeletal:     Cervical back: Normal range of motion.       Back:     Right lower leg: No edema.     Left lower leg: No edema.     Comments: Back:  Range of motion decreased. Tenderness in the midline and left paraspinous muscles from L4 to Sacral area.  Straight  leg raising test is negative.  Sitting knee extension test is negative.  Strength and sensation in the lower extremities is normal.  Patellar and achilles reflexes are normal. Left hip has good range of motion, and no tenderness to palpation.   Skin:    General: Skin is warm and dry.     Findings: No rash.  Neurological:     General: No focal deficit present.     Mental Status: He is alert and oriented to person, place, and time.      UC Treatments / Results  Labs (all labs ordered are listed, but only abnormal results are displayed) Labs Reviewed - No data to display  EKG   Radiology DG Lumbar Spine Complete  Result Date: 12/16/2022 CLINICAL DATA:  Intermittent left buttock pain for about 10 days after lifting EXAM: LUMBAR SPINE - COMPLETE 4+ VIEW COMPARISON:  Radiograph 10/25/2017, CT 04/15/2008 FINDINGS: Numbering is consistent with prior exams. There are bilateral L5 pars defects at L4 and L5 with unchanged borderline grade 2 anterolisthesis at L4-L5 and moderate-severe degenerative disc disease at this level. There is mild-to-moderate degenerative disc disease above this level, moderate at T12-L1 and L3-L4. Trace degenerative retrolisthesis at L2-L3 and L3-L4. There is moderate lower lumbar predominant facet arthropathy. IVC filter and vascular coils noted. IMPRESSION: Bilateral L5 pars defects at L4 and L5 with unchanged borderline grade 2 anterolisthesis at L4-L5 and moderate-severe degenerative disc disease at this level. Mild to moderate multilevel degenerative disc disease above this level. Moderate lower lumbar predominant facet arthropathy.  Electronically Signed   By: Caprice Renshaw M.D.   On: 12/16/2022 12:59    Procedures Procedures (including critical care time)  Medications Ordered in UC Medications - No data to display  Initial Impression / Assessment and Plan / UC Course  I have reviewed the triage vital signs and the nursing notes.  Pertinent labs & imaging results  that were available during my care of the patient were reviewed by me and considered in my medical decision making (see chart for details).    Today's LS spine x-rays do not show significant changes from previous studies. Begin prednisone burst/taper. Followup with Dr[facility] if not improving about two weeks.   Final Clinical Impressions(s) / UC Diagnoses   Final diagnoses:  Lumbar radiculopathy, acute     Discharge Instructions      Apply ice pack for to lower spine for 15 to 20 minutes, 2 to 3 times daily  Continue until pain decreases.  May continue tramadol as needed.     ED Prescriptions     Medication Sig Dispense Auth. Provider   predniSONE (DELTASONE) 20 MG tablet Take one tab by mouth twice daily for 4 days, then one daily for 3 days. Take with food. 11 tablet Lattie Haw, MD         Lattie Haw, MD 12/17/22 [phone]

## 2023-01-17 ENCOUNTER — Ambulatory Visit (INDEPENDENT_AMBULATORY_CARE_PROVIDER_SITE_OTHER): Payer: Medicare Other | Admitting: Sports Medicine

## 2023-01-17 ENCOUNTER — Other Ambulatory Visit: Payer: Self-pay | Admitting: Sports Medicine

## 2023-01-17 DIAGNOSIS — M4316 Spondylolisthesis, lumbar region: Secondary | ICD-10-CM | POA: Diagnosis not present

## 2023-01-17 DIAGNOSIS — M456 Ankylosing spondylitis lumbar region: Secondary | ICD-10-CM

## 2023-01-17 DIAGNOSIS — Z8719 Personal history of other diseases of the digestive system: Secondary | ICD-10-CM | POA: Diagnosis not present

## 2023-01-17 DIAGNOSIS — M450 Ankylosing spondylitis of multiple sites in spine: Secondary | ICD-10-CM

## 2023-01-17 MED ORDER — CELECOXIB 100 MG PO CAPS: ORAL_CAPSULE | ORAL | 3 refills | Status: AC

## 2023-01-17 NOTE — Progress Notes (Signed)
    Procedures performed today:    None.  Independent interpretation of notes and tests performed by another provider:   X-rays and MRI personally reviewed, MRI shows L4-L5 spinal listhesis with facet arthritis, central stenosis, more recent x-rays show DDD at multiple levels with grade 2 L4-L5 spondylolisthesis and lesser so at L5-S1.  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  For a long time he has had back pain, he has seen Dr. Danielle Dess and Dr. Otelia Sergeant, he has had some medications but never physical therapy, no injections and no surgery. More recently he was trying to look to see out of a tractor and felt severe and worsening back pain with radiation down the left leg to the outer lower leg. He was seen in urgent care and given prednisone, this helped about 50%. His exam is for the most part unrevealing, radicular symptoms are gone but he still has back pain, today we discussed the anatomy and evolutionary anthropology of disc disease, spondylolisthesis and spinal stenosis, we discussed the treatment protocol including starting with conservative treatment such as oral medications and physical therapy. We will do Celebrex, he was told by his neurosurgeon that he had ankylosing spondylitis, so I would also like to go and check an HLA-B27. I would like to see him back in about 6 weeks. He tells me he did have a GI bleed in the past, so we will go and check his hemoglobin today with his HLA-B27 and then recheck hemoglobin in 6 weeks at his follow-up. He does have a pacemaker.  Unclear as to whether it is MRI safe/conditional or not.  I spent 40 minutes of total time managing this patient today, this includes chart review, face to face, and non-face to face  time.  ____________________________________________ Leroy Griffin. Benjamin Stain, M.D., ABFM., CAQSM., AME. Primary Care and Sports Medicine [facility]  Adjunct Professor of Family Medicine  [place] [employer] [facility]  Restaurant manager, fast food

## 2023-01-17 NOTE — Assessment & Plan Note (Signed)
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  For a long time he has had back pain, he has seen Dr. Danielle Dess and Dr. Otelia Sergeant, he has had some medications but never physical therapy, no injections and no surgery. More recently he was trying to look to see out of a tractor and felt severe and worsening back pain with radiation down the left leg to the outer lower leg. He was seen in urgent care and given prednisone, this helped about 50%. His exam is for the most part unrevealing, radicular symptoms are gone but he still has back pain, today we discussed the anatomy and evolutionary anthropology of disc disease, spondylolisthesis and spinal stenosis, we discussed the treatment protocol including starting with conservative treatment such as oral medications and physical therapy. We will do Celebrex, he was told by his neurosurgeon that he had ankylosing spondylitis, so I would also like to go and check an HLA-B27. I would like to see him back in about 6 weeks. He tells me he did have a GI bleed in the past, so we will go and check his hemoglobin today with his HLA-B27 and then recheck hemoglobin in 6 weeks at his follow-up. He does have a pacemaker.  Unclear as to whether it is MRI safe/conditional or not.

## 2023-01-18 ENCOUNTER — Other Ambulatory Visit: Payer: Self-pay | Admitting: Nurse Practitioner

## 2023-01-18 DIAGNOSIS — I2510 Atherosclerotic heart disease of native coronary artery without angina pectoris: Secondary | ICD-10-CM

## 2023-01-18 DIAGNOSIS — E7800 Pure hypercholesterolemia, unspecified: Secondary | ICD-10-CM

## 2023-01-19 DIAGNOSIS — M459 Ankylosing spondylitis of unspecified sites in spine: Secondary | ICD-10-CM | POA: Insufficient documentation

## 2023-01-19 LAB — CBC
HCT: 44.9 % (ref 38.5–50.0)
Hemoglobin: 15 g/dL (ref 13.2–17.1)
MCH: 33 pg (ref 27.0–33.0)
MCHC: 33.4 g/dL (ref 32.0–36.0)
MCV: 98.9 fL (ref 80.0–100.0)
MPV: 10 fL (ref 7.5–12.5)
Platelets: 158 10*3/uL (ref 140–400)
RBC: 4.54 10*6/uL (ref 4.20–5.80)
RDW: 12.6 % (ref 11.0–15.0)
WBC: 4.4 10*3/uL (ref 3.8–10.8)

## 2023-01-19 LAB — HLA-B27 ANTIGEN: HLA-B27 Antigen: POSITIVE — AB

## 2023-01-19 NOTE — Addendum Note (Signed)
Addended by: Monica Becton on: 01/19/2023 04:57 PM   Modules accepted: Orders

## 2023-01-19 NOTE — Assessment & Plan Note (Signed)
Multiple spondylitic processes in the spine with positive HLA-B27, I would like rheumatology to weigh in.

## 2023-02-03 DIAGNOSIS — H43813 Vitreous degeneration, bilateral: Secondary | ICD-10-CM | POA: Diagnosis not present

## 2023-02-03 DIAGNOSIS — H25813 Combined forms of age-related cataract, bilateral: Secondary | ICD-10-CM | POA: Diagnosis not present

## 2023-02-03 DIAGNOSIS — H524 Presbyopia: Secondary | ICD-10-CM | POA: Diagnosis not present

## 2023-02-03 DIAGNOSIS — H353132 Nonexudative age-related macular degeneration, bilateral, intermediate dry stage: Secondary | ICD-10-CM | POA: Diagnosis not present

## 2023-02-06 ENCOUNTER — Other Ambulatory Visit: Payer: Self-pay | Admitting: Internal Medicine

## 2023-02-06 DIAGNOSIS — Z1211 Encounter for screening for malignant neoplasm of colon: Secondary | ICD-10-CM | POA: Diagnosis not present

## 2023-02-06 DIAGNOSIS — D122 Benign neoplasm of ascending colon: Secondary | ICD-10-CM | POA: Diagnosis not present

## 2023-02-07 ENCOUNTER — Other Ambulatory Visit: Payer: Self-pay

## 2023-02-07 ENCOUNTER — Ambulatory Visit: Payer: Medicare Other | Attending: Sports Medicine | Admitting: Physical Therapy

## 2023-02-07 ENCOUNTER — Encounter: Payer: Self-pay | Admitting: Physical Therapy

## 2023-02-07 DIAGNOSIS — M5459 Other low back pain: Secondary | ICD-10-CM | POA: Insufficient documentation

## 2023-02-07 DIAGNOSIS — M6281 Muscle weakness (generalized): Secondary | ICD-10-CM | POA: Insufficient documentation

## 2023-02-07 DIAGNOSIS — M4316 Spondylolisthesis, lumbar region: Secondary | ICD-10-CM | POA: Insufficient documentation

## 2023-02-07 NOTE — Therapy (Signed)
OUTPATIENT PHYSICAL THERAPY THORACOLUMBAR EVALUATION   Patient Name: Leroy Griffin MRN: [id number] DOB:1946/01/06, 77 y.o., male Today's Date: 02/07/2023  END OF SESSION:  PT End of Session - 02/07/23 1418     Visit Number 1    Number of Visits 16    Date for PT Re-Evaluation 04/04/23    Authorization Type Medicare    Authorization - Visit Number 1    Progress Note Due on Visit 10    PT Start Time 1320    PT Stop Time 1402    PT Time Calculation (min) 42 min    Activity Tolerance Patient tolerated treatment well    Behavior During Therapy [facility] for tasks assessed/performed             Past Medical History:  Diagnosis Date   CAD (coronary artery disease)    Essential tremor    Gastric ulcer 01/14/2015   Gout    Hyperlipidemia    Hypertension    Parkinson's disease    Prediabetes    Proteinuria    Vitamin D deficiency    Past Surgical History:  Procedure Laterality Date   CHOLECYSTECTOMY     CYST EXCISION     back   KNEE SURGERY Right    PACEMAKER IMPLANT     TONSILLECTOMY     Patient Active Problem List   Diagnosis Date Noted   Ankylosing spondylitis (HCC) 01/19/2023   Spondylolisthesis of lumbar region 01/17/2023   S/P placement of cardiac pacemaker 11/18/2020   Other abnormalities of gait and mobility 01/17/2019   Depression due to physical illness 01/17/2019   Overactive bladder 01/11/2018   S/P drug eluting coronary stent placement 11/21/2017   CAD in native artery 11/20/2017   Anxiety 11/14/2017   Overweight (BMI 25.0-29.9) 08/26/2014   Sick sinus syndrome (HCC) 10/10/2013   Medication management 10/06/2013   Hyperlipidemia 08/27/2013   Essential tremor    Proteinuria    Other abnormal glucose    Gout    Vitamin D deficiency    Essential hypertension 10/10/2010    PCP: Oneta Rack  REFERRING PROVIDER: Benjamin Stain  REFERRING DIAG: spondylolisthesis of lumbar region  Rationale for Evaluation and Treatment: Rehabilitation  THERAPY DIAG:   Other low back pain  Muscle weakness (generalized)  ONSET DATE: 12/2022  SUBJECTIVE:                                                                                                                                                                                           SUBJECTIVE STATEMENT: Pt states he was repairing a tractor seat and it got caught which caused him to strain his  back. He saw MD who thought it was a pinched nerve and referred pt to Dr. Benjamin Stain who recommended PT and confirmed diagnosis of ankylosing spondylitis. Pt states his pain increases with walking > 100' and prolonged standing and heavy lifting, pain decreases with rest and occasional use of meds  PERTINENT HISTORY:  Ankylosing spondylitis Bilateral knee pain  PAIN:  Are you having pain? Yes: NPRS scale: 1/10 at rest, up to 02/14/09 Pain location: low back Pain description: high discomfort Aggravating factors: lift, stand, prolonged walking Relieving factors: rest, meds  PRECAUTIONS: ICD/Pacemaker  WEIGHT BEARING RESTRICTIONS: No  FALLS:  Has patient fallen in last 6 months? 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   Mild to moderate multilevel degenerative disc disease above this level.   Moderate lower lumbar predominant facet arthropathy.   SENSATION: WFL  MUSCLE LENGTH: Hamstrings: Right 90 deg; Left 90 deg Thomas test: (+) bilat  POSTURE: decreased lumbar lordosis  and flexed trunk   PALPATION: TTP with gentle pressure L3 spinous process Increased mm spasticity bilat lumbar paraspinals  LUMBAR ROM:   AROM eval  Flexion WFL  Extension 75%  Right lateral flexion 50%  Left lateral flexion 50%  Right rotation 75%  Left rotation 75%   (Blank rows = not tested)  LOWER EXTREMITY MMT:    MMT Right eval Left eval  Hip flexion 4- 4  Hip extension 3 3  Hip abduction 4- 4-  Hip adduction    Hip internal rotation    Hip external rotation    Knee flexion    Knee extension    Ankle dorsiflexion    Ankle plantarflexion    Ankle inversion    Ankle eversion     (Blank rows = not tested)  LUMBAR SPECIAL TESTS:  Straight leg raise test: Negative and FABER test: Negative   GAIT: Distance walked: 100' Assistive device utilized: Single point cane Level of assistance: Modified independence Comments: decreased cadence, small step and stride length  TODAY'S TREATMENT:                                                                                                                              DATE: 02/07/23 See HEP    PATIENT EDUCATION:  Education details: PT POC and goals, HEP Person educated: Patient and Spouse Education method: Explanation, Demonstration, and Handouts Education comprehension: verbalized understanding  HOME EXERCISE PROGRAM: Access Code: [email or web address] URL: [email or web address] Date: 02/07/2023 Prepared by: Reggy Eye  Exercises - Seated Hip  Flexor Stretch  - 1 x daily - 7 x weekly - 1 sets - 3 reps - 20 seconds hold - Hip Abduction with Resistance Loop  - 1 x daily - 7 x weekly - 3 sets - 10 reps - Hip Extension with Resistance Loop  - 1 x daily - 7 x weekly - 3 sets - 10 reps - Standing Lumbar Extension  - 1 x daily - 7 x weekly - 1 sets - 10 reps - 10 seconds hold  ASSESSMENT:  CLINICAL IMPRESSION: Patient is a 77 y.o. male who was seen today for physical therapy evaluation and treatment for  spondylolisthesis of lumbar region. Pt presents with forward flexed trunk, decreased trunk ROM, decreased hip and core strength, decreased standing and walking tolerance and increased pain. Pt will benefit from skilled PT to address deficits an improve functional mobility and activity tolerance.   OBJECTIVE IMPAIRMENTS: decreased activity tolerance, decreased balance, decreased mobility, decreased ROM, decreased strength, increased muscle spasms, impaired flexibility, postural dysfunction, and pain.   ACTIVITY LIMITATIONS: lifting, bending, standing, sleeping, stairs, and locomotion level  PARTICIPATION LIMITATIONS: community activity and yard work  PERSONAL FACTORS: Age, Past/current experiences, and Time since onset of injury/illness/exacerbation are also affecting patient's functional outcome.   REHAB POTENTIAL: Good  CLINICAL DECISION MAKING: Evolving/moderate complexity  EVALUATION COMPLEXITY: Moderate   GOALS: Goals reviewed with patient? Yes  SHORT TERM GOALS: Target date: 03/07/2023    Pt will be independent with initial HEP Baseline: Goal status: INITIAL  LONG TERM GOALS: Target date: 04/04/2023    Pt will be independent with advanced HEP Baseline:  Goal status: INITIAL  2.  Pt will improve bilat LE strength to 4+/5 to improve standing and walking tolerance Baseline:  Goal status: INITIAL  3.  Pt will tolerate lifting 15# floor to chest with pain <= 1/10 Baseline:  Goal status: INITIAL  4.  Pt will tolerate standing x 20 minutes to perform leisure activities with pain <= 1/10 Baseline:  Goal status: INITIAL   PLAN:  PT FREQUENCY: 2x/week  PT DURATION: 8 weeks  PLANNED INTERVENTIONS: Therapeutic exercises, Therapeutic activity, Neuromuscular re-education, Balance training, Gait training, Patient/Family education, Self Care, Joint mobilization, Aquatic Therapy, Dry Needling, Cryotherapy, Moist heat, Taping, Ionotophoresis 4mg /ml Dexamethasone, Manual  therapy, and Re-evaluation.  PLAN FOR NEXT SESSION: assess response to HEP, core and hip strength, balance   Mardee Clune, PT 02/07/2023, 2:19 PM

## 2023-02-28 DIAGNOSIS — I495 Sick sinus syndrome: Secondary | ICD-10-CM | POA: Diagnosis not present

## 2023-03-02 ENCOUNTER — Ambulatory Visit: Payer: Medicare Other | Admitting: Sports Medicine

## 2023-03-02 ENCOUNTER — Ambulatory Visit: Payer: Medicare Other

## 2023-03-02 DIAGNOSIS — M6281 Muscle weakness (generalized): Secondary | ICD-10-CM | POA: Diagnosis not present

## 2023-03-02 DIAGNOSIS — M5459 Other low back pain: Secondary | ICD-10-CM

## 2023-03-02 DIAGNOSIS — M4316 Spondylolisthesis, lumbar region: Secondary | ICD-10-CM | POA: Diagnosis not present

## 2023-03-02 NOTE — Therapy (Signed)
OUTPATIENT PHYSICAL THERAPY THORACOLUMBAR TREATMENT   Patient Name: Leroy Griffin MRN: [id number] DOB:1946-04-08, 77 y.o., male Today's Date: 03/02/2023  END OF SESSION:  PT End of Session - 03/02/23 1448     Visit Number 2    Number of Visits 16    Date for PT Re-Evaluation 04/04/23    Authorization Type Medicare    Authorization - Visit Number 2    Progress Note Due on Visit 10    PT Start Time 1450    PT Stop Time 1536    PT Time Calculation (min) 46 min    Activity Tolerance Patient tolerated treatment well    Behavior During Therapy [facility] for tasks assessed/performed             Past Medical History:  Diagnosis Date   CAD (coronary artery disease)    Essential tremor    Gastric ulcer 01/14/2015   Gout    Hyperlipidemia    Hypertension    Parkinson's disease    Prediabetes    Proteinuria    Vitamin D deficiency    Past Surgical History:  Procedure Laterality Date   CHOLECYSTECTOMY     CYST EXCISION     back   KNEE SURGERY Right    PACEMAKER IMPLANT     TONSILLECTOMY     Patient Active Problem List   Diagnosis Date Noted   Ankylosing spondylitis (HCC) 01/19/2023   Spondylolisthesis of lumbar region 01/17/2023   S/P placement of cardiac pacemaker 11/18/2020   Other abnormalities of gait and mobility 01/17/2019   Depression due to physical illness 01/17/2019   Overactive bladder 01/11/2018   S/P drug eluting coronary stent placement 11/21/2017   CAD in native artery 11/20/2017   Anxiety 11/14/2017   Overweight (BMI 25.0-29.9) 08/26/2014   Sick sinus syndrome (HCC) 10/10/2013   Medication management 10/06/2013   Hyperlipidemia 08/27/2013   Essential tremor    Proteinuria    Other abnormal glucose    Gout    Vitamin D deficiency    Essential hypertension 10/10/2010    PCP: Oneta Rack  REFERRING PROVIDER: Benjamin Stain  REFERRING DIAG: spondylolisthesis of lumbar region  Rationale for Evaluation and Treatment: Rehabilitation  THERAPY DIAG:   Other low back pain  Muscle weakness (generalized)  ONSET DATE: 12/2022  SUBJECTIVE:                                                                                                                                                                                           SUBJECTIVE STATEMENT: Patient reports his sharp pain is gone but his knees and legs get sore if he stands for  too long. Patient's wife present for therapy; she states they recently took a car drive to [place] and his back was very sore.   PERTINENT HISTORY:  Ankylosing spondylitis Bilateral knee pain  PAIN:  Are you having pain? Yes: NPRS scale: 0/10 at rest, up to 02/14/09 Pain location: low back Pain description: high discomfort Aggravating factors: lift, stand, prolonged walking Relieving factors: rest, meds  PRECAUTIONS: ICD/Pacemaker  WEIGHT BEARING RESTRICTIONS: No  FALLS:  Has patient fallen in last 6 months? 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   Mild to moderate multilevel degenerative disc disease above this level.   Moderate lower lumbar predominant facet arthropathy.   SENSATION: WFL  MUSCLE LENGTH: Hamstrings: Right 90 deg; Left 90 deg Thomas test: (+) bilat  POSTURE: decreased lumbar lordosis and flexed trunk   PALPATION: TTP with gentle pressure L3 spinous process Increased mm spasticity bilat lumbar paraspinals  LUMBAR ROM:   AROM eval  Flexion  WFL  Extension 75%  Right lateral flexion 50%  Left lateral flexion 50%  Right rotation 75%  Left rotation 75%   (Blank rows = not tested)  LOWER EXTREMITY MMT:    MMT Right eval Left eval  Hip flexion 4- 4  Hip extension 3 3  Hip abduction 4- 4-  Hip adduction    Hip internal rotation    Hip external rotation    Knee flexion    Knee extension    Ankle dorsiflexion    Ankle plantarflexion    Ankle inversion    Ankle eversion     (Blank rows = not tested)  LUMBAR SPECIAL TESTS:  Straight leg raise test: Negative and FABER test: Negative   GAIT: Distance walked: 100' Assistive device utilized: Single point cane Level of assistance: Modified independence Comments: decreased cadence, small step and stride length  TODAY'S TREATMENT:  [facility] Adult PT Treatment:                                                DATE: 03/02/2023 Therapeutic Exercise: Seated edge of table hip flexor stretch Standing lumbar extension: back against high table, orange PB for support bent over high table hip extension x10 (B) Standing resisted hip abd RTB x10 (B) Resisted side stepping RTB (crossed at ankles, stepping along table) Standing resisted hip extension 2x10 Hooklying hip add ball squeeze x10 Supine bridges + ball squeeze x10 Supine HS stretch w/strap  DATE: 02/07/23 See HEP    PATIENT EDUCATION:  Education details: PT POC and goals, HEP Person educated: Patient and Spouse Education method: Explanation, Demonstration, and Handouts Education comprehension: verbalized understanding  HOME EXERCISE PROGRAM: Access Code: [email or web address] URL: [email or web address] Date: 02/07/2023 Prepared by: Reggy Eye  Exercises - Seated Hip Flexor Stretch  - 1 x daily - 7 x weekly - 1 sets - 3 reps - 20 seconds hold - Hip Abduction with Resistance Loop  - 1 x daily  - 7 x weekly - 3 sets - 10 reps - Hip Extension with Resistance Loop  - 1 x daily - 7 x weekly - 3 sets - 10 reps - Standing Lumbar Extension  - 1 x daily - 7 x weekly - 1 sets - 10 reps - 10 seconds hold  ASSESSMENT:  CLINICAL IMPRESSION: Physioball stabilized at patient's back during standing lumbar extension stretch to support and decompress lumbar spine. Hip strengthening progressed with resisted side stepping. Recommended patient incorporate standing heel raises with morning exercise routine.   EVAL: Patient is a 77 y.o. male who was seen today for physical therapy evaluation and treatment for spondylolisthesis of lumbar region. Pt presents with forward flexed trunk, decreased trunk ROM, decreased hip and core strength, decreased standing and walking tolerance and increased pain. Pt will benefit from skilled PT to address deficits an improve functional mobility and activity tolerance.   OBJECTIVE IMPAIRMENTS: decreased activity tolerance, decreased balance, decreased mobility, decreased ROM, decreased strength, increased muscle spasms, impaired flexibility, postural dysfunction, and pain.   ACTIVITY LIMITATIONS: lifting, bending, standing, sleeping, stairs, and locomotion level  PARTICIPATION LIMITATIONS: community activity and yard work  PERSONAL FACTORS: Age, Past/current experiences, and Time since onset of injury/illness/exacerbation are also affecting patient's functional outcome.   REHAB POTENTIAL: Good  CLINICAL DECISION MAKING: Evolving/moderate complexity  EVALUATION COMPLEXITY: Moderate   GOALS: Goals reviewed with patient? Yes  SHORT TERM GOALS: Target date: 7/30/202  Pt will be independent with initial HEP Baseline: Goal status: INITIAL  LONG TERM GOALS: Target date: 04/04/2023  Pt will be independent with advanced HEP Baseline:  Goal status: INITIAL  2.  Pt will improve bilat LE strength to 4+/5 to improve standing and walking tolerance Baseline:  Goal  status: INITIAL  3.  Pt will tolerate lifting 15# floor to chest with pain <= 1/10 Baseline:  Goal status: INITIAL  4.  Pt will tolerate standing x 20 minutes to perform leisure activities with pain <= 1/10 Baseline:  Goal status: INITIAL   PLAN:  PT FREQUENCY: 2x/week  PT DURATION: 8 weeks  PLANNED INTERVENTIONS: Therapeutic exercises, Therapeutic activity, Neuromuscular re-education, Balance training, Gait training, Patient/Family education, Self Care, Joint mobilization, Aquatic Therapy, Dry Needling, Cryotherapy, Moist heat, Taping, Ionotophoresis 4mg /ml Dexamethasone, Manual therapy, and Re-evaluation.  PLAN FOR NEXT SESSION: Add on HEP. Continue core and hip strength, balance; trunk and hip extension   Sanjuana Mae, PTA 03/02/2023, 3:37 PM

## 2023-03-06 ENCOUNTER — Ambulatory Visit: Payer: Medicare Other | Admitting: Physical Therapy

## 2023-03-06 ENCOUNTER — Encounter: Payer: Self-pay | Admitting: Physical Therapy

## 2023-03-06 DIAGNOSIS — M4316 Spondylolisthesis, lumbar region: Secondary | ICD-10-CM | POA: Diagnosis not present

## 2023-03-06 DIAGNOSIS — M5459 Other low back pain: Secondary | ICD-10-CM | POA: Diagnosis not present

## 2023-03-06 DIAGNOSIS — M6281 Muscle weakness (generalized): Secondary | ICD-10-CM | POA: Diagnosis not present

## 2023-03-06 NOTE — Therapy (Signed)
OUTPATIENT PHYSICAL THERAPY THORACOLUMBAR TREATMENT   Patient Name: Leroy Griffin MRN: [id number] DOB:1946-07-22, 77 y.o., male Today's Date: 03/06/2023  END OF SESSION:  PT End of Session - 03/06/23 1441     Visit Number 3    Number of Visits 16    Date for PT Re-Evaluation 04/04/23    Authorization Type Medicare    Authorization - Visit Number 3    Progress Note Due on Visit 10    PT Start Time 1400    PT Stop Time 1442    PT Time Calculation (min) 42 min    Activity Tolerance Patient tolerated treatment well    Behavior During Therapy [facility] for tasks assessed/performed              Past Medical History:  Diagnosis Date   CAD (coronary artery disease)    Essential tremor    Gastric ulcer 01/14/2015   Gout    Hyperlipidemia    Hypertension    Parkinson's disease    Prediabetes    Proteinuria    Vitamin D deficiency    Past Surgical History:  Procedure Laterality Date   CHOLECYSTECTOMY     CYST EXCISION     back   KNEE SURGERY Right    PACEMAKER IMPLANT     TONSILLECTOMY     Patient Active Problem List   Diagnosis Date Noted   Ankylosing spondylitis (HCC) 01/19/2023   Spondylolisthesis of lumbar region 01/17/2023   S/P placement of cardiac pacemaker 11/18/2020   Other abnormalities of gait and mobility 01/17/2019   Depression due to physical illness 01/17/2019   Overactive bladder 01/11/2018   S/P drug eluting coronary stent placement 11/21/2017   CAD in native artery 11/20/2017   Anxiety 11/14/2017   Overweight (BMI 25.0-29.9) 08/26/2014   Sick sinus syndrome (HCC) 10/10/2013   Medication management 10/06/2013   Hyperlipidemia 08/27/2013   Essential tremor    Proteinuria    Other abnormal glucose    Gout    Vitamin D deficiency    Essential hypertension 10/10/2010    PCP: Oneta Rack  REFERRING PROVIDER: Benjamin Stain  REFERRING DIAG: spondylolisthesis of lumbar region  Rationale for Evaluation and Treatment: Rehabilitation  THERAPY DIAG:   Other low back pain  Muscle weakness (generalized)  ONSET DATE: 12/2022  SUBJECTIVE:                                                                                                                                                                                           SUBJECTIVE STATEMENT: Pt states he was changing his lawn mower battery and had to bend over for a while  and his back hurt, he says it felt better after resting and "drinking a can of beer"  PERTINENT HISTORY:  Ankylosing spondylitis Bilateral knee pain  PAIN:  Are you having pain? Yes: NPRS scale: 0/10 at rest, up to 02/14/09 Pain location: low back Pain description: high discomfort Aggravating factors: lift, stand, prolonged walking Relieving factors: rest, meds  PRECAUTIONS: ICD/Pacemaker  WEIGHT BEARING RESTRICTIONS: No  FALLS:  Has patient fallen in last 6 months? 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   Mild to moderate multilevel degenerative disc disease above this level.   Moderate lower lumbar predominant facet arthropathy.   SENSATION: WFL  MUSCLE LENGTH: Hamstrings: Right 90 deg; Left 90 deg Thomas test: (+) bilat  POSTURE: decreased lumbar lordosis and flexed trunk   PALPATION: TTP with gentle pressure L3 spinous process Increased mm spasticity bilat lumbar paraspinals  LUMBAR ROM:   AROM eval  Flexion WFL  Extension 75%  Right lateral flexion 50%  Left  lateral flexion 50%  Right rotation 75%  Left rotation 75%   (Blank rows = not tested)  LOWER EXTREMITY MMT:    MMT Right eval Left eval  Hip flexion 4- 4  Hip extension 3 3  Hip abduction 4- 4-  Hip adduction    Hip internal rotation    Hip external rotation    Knee flexion    Knee extension    Ankle dorsiflexion    Ankle plantarflexion    Ankle inversion    Ankle eversion     (Blank rows = not tested)  LUMBAR SPECIAL TESTS:  Straight leg raise test: Negative and FABER test: Negative   GAIT: Distance walked: 100' Assistive device utilized: Single point cane Level of assistance: Modified independence Comments: decreased cadence, small step and stride length  TODAY'S TREATMENT:  [facility] Adult PT Treatment:                                                DATE: 03/06/23 Therapeutic Exercise: Trunk extension orange physioball behind back x 12 Shoulder ext green TB x 12 Pallof green TB x 10 bilat Seated hip flexor stretch 2 x 30 sec bilat Side step red TB Hip ext red TB 2 x 10 Bridge with ball squeeze 2 x 10 LTR with ball squeeze x 10 SLR 2 x 10 bilat STS without UE support x 10   [facility] Adult PT Treatment:                                                DATE: 03/02/2023 Therapeutic Exercise: Seated edge of table hip flexor stretch Standing lumbar extension: back against high table, orange PB for support bent over high table hip extension x10 (B) Standing resisted hip abd RTB x10 (B)  Resisted side stepping RTB (crossed at ankles, stepping along table) Standing resisted hip extension 2x10 Hooklying hip add ball squeeze x10 Supine bridges + ball squeeze x10 Supine HS stretch w/strap                                                                                                                              PATIENT EDUCATION:  Education details: PT POC and goals, HEP Person educated: Patient and Spouse Education method: Explanation, Facilities manager, and Handouts Education  comprehension: verbalized understanding  HOME EXERCISE PROGRAM: Access Code: [email or web address] URL: [email or web address] Date: 03/06/2023 Prepared by: Reggy Eye  Exercises - Seated Hip Flexor Stretch  - 1 x daily - 7 x weekly - 1 sets - 3 reps - 20 seconds hold - Hip Abduction with Resistance Loop  - 1 x daily - 7 x weekly - 3 sets - 10 reps - Hip Extension with Resistance Loop  - 1 x daily - 7 x weekly - 3 sets - 10 reps - Standing Lumbar Extension with Counter  - 1 x daily - 7 x weekly - 3 sets - 10 reps - Sit to Stand Without Arm Support  - 1 x daily - 7 x weekly - 2 sets - 10 reps  ASSESSMENT:  CLINICAL IMPRESSION: Pt with good tolerance to all exercises today. Challenged by STS without UE support. Added to HEP. He has decreased "pressure" in low back with physioball behind back trunk ext  EVAL: Patient is a 77 y.o. male who was seen today for physical therapy evaluation and treatment for spondylolisthesis of lumbar region. Pt presents with forward flexed trunk, decreased trunk ROM, decreased hip and core strength, decreased standing and walking tolerance and increased pain. Pt will benefit from skilled PT to address deficits an improve functional mobility and activity tolerance.      GOALS: Goals reviewed with patient? Yes  SHORT TERM GOALS: Target date: 7/30/202  Pt will be independent with initial HEP Baseline: Goal status: MET  LONG TERM GOALS: Target date: 04/04/2023  Pt will be independent with advanced HEP Baseline:  Goal status: INITIAL  2.  Pt will improve bilat LE strength to 4+/5 to improve standing and walking tolerance Baseline:  Goal status: INITIAL  3.  Pt will tolerate lifting 15# floor to chest with pain <= 1/10 Baseline:  Goal status: INITIAL  4.  Pt will tolerate standing x 20 minutes to perform leisure activities with pain <= 1/10 Baseline:  Goal status: INITIAL   PLAN:  PT FREQUENCY: 2x/week  PT DURATION: 8 weeks  PLANNED  INTERVENTIONS: Therapeutic exercises, Therapeutic activity, Neuromuscular re-education, Balance training, Gait training, Patient/Family education, Self Care, Joint mobilization, Aquatic Therapy, Dry Needling, Cryotherapy, Moist heat, Taping, Ionotophoresis 4mg /ml Dexamethasone, Manual therapy, and Re-evaluation.  PLAN FOR NEXT SESSION:  Continue core and hip strength, balance; trunk and hip extension   Roanna Reaves, PT 03/06/2023, 2:42 PM

## 2023-03-08 ENCOUNTER — Encounter: Payer: Self-pay | Admitting: Physical Therapy

## 2023-03-08 ENCOUNTER — Ambulatory Visit: Payer: Medicare Other | Admitting: Physical Therapy

## 2023-03-08 DIAGNOSIS — M6281 Muscle weakness (generalized): Secondary | ICD-10-CM | POA: Diagnosis not present

## 2023-03-08 DIAGNOSIS — M5459 Other low back pain: Secondary | ICD-10-CM | POA: Diagnosis not present

## 2023-03-08 DIAGNOSIS — M4316 Spondylolisthesis, lumbar region: Secondary | ICD-10-CM | POA: Diagnosis not present

## 2023-03-08 NOTE — Therapy (Signed)
OUTPATIENT PHYSICAL THERAPY THORACOLUMBAR TREATMENT   Patient Name: Leroy Griffin MRN: [id number] DOB:05-Nov-1945, 77 y.o., male Today's Date: 03/08/2023  END OF SESSION:  PT End of Session - 03/08/23 1439     Visit Number 4    Number of Visits 16    Date for PT Re-Evaluation 04/04/23    Authorization Type Medicare    Authorization - Visit Number 4    Progress Note Due on Visit 10    PT Start Time 1400    PT Stop Time 1441    PT Time Calculation (min) 41 min    Activity Tolerance Patient tolerated treatment well    Behavior During Therapy [facility] for tasks assessed/performed               Past Medical History:  Diagnosis Date   CAD (coronary artery disease)    Essential tremor    Gastric ulcer 01/14/2015   Gout    Hyperlipidemia    Hypertension    Parkinson's disease    Prediabetes    Proteinuria    Vitamin D deficiency    Past Surgical History:  Procedure Laterality Date   CHOLECYSTECTOMY     CYST EXCISION     back   KNEE SURGERY Right    PACEMAKER IMPLANT     TONSILLECTOMY     Patient Active Problem List   Diagnosis Date Noted   Ankylosing spondylitis (HCC) 01/19/2023   Spondylolisthesis of lumbar region 01/17/2023   S/P placement of cardiac pacemaker 11/18/2020   Other abnormalities of gait and mobility 01/17/2019   Depression due to physical illness 01/17/2019   Overactive bladder 01/11/2018   S/P drug eluting coronary stent placement 11/21/2017   CAD in native artery 11/20/2017   Anxiety 11/14/2017   Overweight (BMI 25.0-29.9) 08/26/2014   Sick sinus syndrome (HCC) 10/10/2013   Medication management 10/06/2013   Hyperlipidemia 08/27/2013   Essential tremor    Proteinuria    Other abnormal glucose    Gout    Vitamin D deficiency    Essential hypertension 10/10/2010    PCP: Oneta Rack  REFERRING PROVIDER: Benjamin Stain  REFERRING DIAG: spondylolisthesis of lumbar region  Rationale for Evaluation and Treatment: Rehabilitation  THERAPY DIAG:   Other low back pain  Muscle weakness (generalized)  ONSET DATE: 12/2022  SUBJECTIVE:                                                                                                                                                                                           SUBJECTIVE STATEMENT: Pt states he feels "ok". He states he was working on his tractor yesterday so his  back is sore  PERTINENT HISTORY:  Ankylosing spondylitis Bilateral knee pain  PAIN:  Are you having pain? Yes: NPRS scale: 0/10 at rest, up to 02/14/09 Pain location: low back Pain description: high discomfort Aggravating factors: lift, stand, prolonged walking Relieving factors: rest, meds  PRECAUTIONS: ICD/Pacemaker  WEIGHT BEARING RESTRICTIONS: No  FALLS:  Has patient fallen in last 6 months? 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   Mild to moderate multilevel degenerative disc disease above this level.   Moderate lower lumbar predominant facet arthropathy.   SENSATION: WFL  MUSCLE LENGTH: Hamstrings: Right 90 deg; Left 90 deg Thomas test: (+) bilat  POSTURE: decreased lumbar lordosis and flexed trunk   PALPATION: TTP with gentle pressure L3 spinous process Increased mm spasticity bilat lumbar paraspinals  LUMBAR ROM:   AROM eval  Flexion WFL  Extension 75%  Right lateral flexion 50%  Left lateral flexion 50%  Right rotation 75%  Left rotation 75%   (Blank  rows = not tested)  LOWER EXTREMITY MMT:    MMT Right eval Left eval  Hip flexion 4- 4  Hip extension 3 3  Hip abduction 4- 4-  Hip adduction    Hip internal rotation    Hip external rotation    Knee flexion    Knee extension    Ankle dorsiflexion    Ankle plantarflexion    Ankle inversion    Ankle eversion     (Blank rows = not tested)  LUMBAR SPECIAL TESTS:  Straight leg raise test: Negative and FABER test: Negative   GAIT: Distance walked: 100' Assistive device utilized: Single point cane Level of assistance: Modified independence Comments: decreased cadence, small step and stride length  TODAY'S TREATMENT:  [facility] Adult PT Treatment:                                                DATE: 03/08/23 Therapeutic Exercise: Nustep L6 x 5 min LEs only Shoulder ext green TB x 15 Pallof press green TB x 10 bilat STS without UE support 2 x 10 Modified dead lift 10#KB 2 x 10 Bridge with ball squeeze 2 x 10 SLR 2# 2 x 10 bilat Trunk extension orange physioball behind back x 12 Seated Hip flexor stretch 10 x 10 sec bilat    [facility] Adult PT Treatment:                                                DATE: 03/06/23 Therapeutic Exercise: Trunk extension orange physioball behind back x 12 Shoulder ext green TB x 12 Pallof green TB x 10 bilat Seated hip flexor stretch 2 x 30 sec bilat Side step red TB Hip ext red TB 2 x 10 Bridge with ball squeeze 2 x  10 LTR with ball squeeze x 10 SLR 2 x 10 bilat STS without UE support x 10   [facility] Adult PT Treatment:                                                DATE: 03/02/2023 Therapeutic Exercise: Seated edge of table hip flexor stretch Standing lumbar extension: back against high table, orange PB for support bent over high table hip extension x10 (B) Standing resisted hip abd RTB x10 (B) Resisted side stepping RTB (crossed at ankles, stepping along table) Standing resisted hip extension 2x10 Hooklying hip add ball squeeze x10 Supine  bridges + ball squeeze x10 Supine HS stretch w/strap                                                                                                                              PATIENT EDUCATION:  Education details: PT POC and goals, HEP Person educated: Patient and Spouse Education method: Explanation, Facilities manager, and Handouts Education comprehension: verbalized understanding  HOME EXERCISE PROGRAM: Access Code: [email or web address] URL: [email or web address] Date: 03/06/2023 Prepared by: Reggy Eye  Exercises - Seated Hip Flexor Stretch  - 1 x daily - 7 x weekly - 1 sets - 3 reps - 20 seconds hold - Hip Abduction with Resistance Loop  - 1 x daily - 7 x weekly - 3 sets - 10 reps - Hip Extension with Resistance Loop  - 1 x daily - 7 x weekly - 3 sets - 10 reps - Standing Lumbar Extension with Counter  - 1 x daily - 7 x weekly - 3 sets - 10 reps - Sit to Stand Without Arm Support  - 1 x daily - 7 x weekly - 2 sets - 10 reps  ASSESSMENT:  CLINICAL IMPRESSION: Progressed LE strength and endurance with good tolerance this session. Pt with LE fatigue at end of session. Pt continues to improve strength and mobility. Will benefit from continued endurance training  EVAL: Patient is a 77 y.o. male who was seen today for physical therapy evaluation and treatment for spondylolisthesis of lumbar region. Pt presents with forward flexed trunk, decreased trunk ROM, decreased hip and core strength, decreased standing and walking tolerance and increased pain. Pt will benefit from skilled PT to address deficits an improve functional mobility and activity tolerance.      GOALS: Goals reviewed with patient? Yes  SHORT TERM GOALS: Target date: 7/30/202  Pt will be independent with initial HEP Baseline: Goal status: MET  LONG TERM GOALS: Target date: 04/04/2023  Pt will be independent with advanced HEP Baseline:  Goal status: INITIAL  2.  Pt will improve bilat LE strength to 4+/5  to improve standing and walking tolerance Baseline:  Goal status: INITIAL  3.  Pt will tolerate lifting 15# floor to chest with pain <=  1/10 Baseline:  Goal status: INITIAL  4.  Pt will tolerate standing x 20 minutes to perform leisure activities with pain <= 1/10 Baseline:  Goal status: INITIAL   PLAN:  PT FREQUENCY: 2x/week  PT DURATION: 8 weeks  PLANNED INTERVENTIONS: Therapeutic exercises, Therapeutic activity, Neuromuscular re-education, Balance training, Gait training, Patient/Family education, Self Care, Joint mobilization, Aquatic Therapy, Dry Needling, Cryotherapy, Moist heat, Taping, Ionotophoresis 4mg /ml Dexamethasone, Manual therapy, and Re-evaluation.  PLAN FOR NEXT SESSION:  Continue core and hip strength, balance; trunk and hip extension   Sylvie Mifsud, PT 03/08/2023, 2:39 PM

## 2023-03-10 ENCOUNTER — Other Ambulatory Visit: Payer: Self-pay | Admitting: Internal Medicine

## 2023-03-10 DIAGNOSIS — M450 Ankylosing spondylitis of multiple sites in spine: Secondary | ICD-10-CM

## 2023-03-10 MED ORDER — TRAMADOL HCL 50 MG PO TABS
ORAL_TABLET | ORAL | 0 refills | Status: AC
Start: 2023-03-10 — End: ?

## 2023-03-15 ENCOUNTER — Ambulatory Visit: Payer: Medicare Other | Attending: Sports Medicine

## 2023-03-15 DIAGNOSIS — M5459 Other low back pain: Secondary | ICD-10-CM | POA: Diagnosis not present

## 2023-03-15 DIAGNOSIS — M6281 Muscle weakness (generalized): Secondary | ICD-10-CM | POA: Diagnosis present

## 2023-03-15 NOTE — Therapy (Signed)
OUTPATIENT PHYSICAL THERAPY THORACOLUMBAR TREATMENT   Patient Name: Leroy Griffin MRN: [id number] DOB:1946/03/06, 77 y.o., male Today's Date: 03/15/2023  END OF SESSION:  PT End of Session - 03/15/23 1317     Visit Number 5    Number of Visits 16    Date for PT Re-Evaluation 04/04/23    Authorization Type Medicare    Authorization - Visit Number 5    Progress Note Due on Visit 10    PT Start Time 1318    PT Stop Time 1400    PT Time Calculation (min) 42 min    Activity Tolerance Patient tolerated treatment well    Behavior During Therapy [facility] for tasks assessed/performed               Past Medical History:  Diagnosis Date   CAD (coronary artery disease)    Essential tremor    Gastric ulcer 01/14/2015   Gout    Hyperlipidemia    Hypertension    Parkinson's disease    Prediabetes    Proteinuria    Vitamin D deficiency    Past Surgical History:  Procedure Laterality Date   CHOLECYSTECTOMY     CYST EXCISION     back   KNEE SURGERY Right    PACEMAKER IMPLANT     TONSILLECTOMY     Patient Active Problem List   Diagnosis Date Noted   Ankylosing spondylitis (HCC) 01/19/2023   Spondylolisthesis of lumbar region 01/17/2023   S/P placement of cardiac pacemaker 11/18/2020   Other abnormalities of gait and mobility 01/17/2019   Depression due to physical illness 01/17/2019   Overactive bladder 01/11/2018   S/P drug eluting coronary stent placement 11/21/2017   CAD in native artery 11/20/2017   Anxiety 11/14/2017   Overweight (BMI 25.0-29.9) 08/26/2014   Sick sinus syndrome (HCC) 10/10/2013   Medication management 10/06/2013   Hyperlipidemia 08/27/2013   Essential tremor    Proteinuria    Other abnormal glucose    Gout    Vitamin D deficiency    Essential hypertension 10/10/2010    PCP: Oneta Rack  REFERRING PROVIDER: Benjamin Stain  REFERRING DIAG: spondylolisthesis of lumbar region  Rationale for Evaluation and Treatment: Rehabilitation  THERAPY DIAG:   Other low back pain  Muscle weakness (generalized)  ONSET DATE: 12/2022  SUBJECTIVE:                                                                                                                                                                                           SUBJECTIVE STATEMENT: Patient reports his knees are bothering him today, states it is worse when going up stairs.  PERTINENT HISTORY:  Ankylosing spondylitis Bilateral knee pain  PAIN:  Are you having pain? Yes: NPRS scale: 0/10 at rest, up to 02/14/09 Pain location: low back Pain description: high discomfort Aggravating factors: lift, stand, prolonged walking Relieving factors: rest, meds  PRECAUTIONS: ICD/Pacemaker  WEIGHT BEARING RESTRICTIONS: No  FALLS:  Has patient fallen in last 6 months? 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   Mild to moderate multilevel degenerative disc disease above this level.   Moderate lower lumbar predominant facet arthropathy.   SENSATION: WFL  MUSCLE LENGTH: Hamstrings: Right 90 deg; Left 90 deg Thomas test: (+) bilat  POSTURE: decreased lumbar lordosis and flexed trunk   PALPATION: TTP with gentle pressure L3 spinous process Increased mm spasticity bilat lumbar paraspinals  LUMBAR ROM:   AROM eval  Flexion WFL  Extension 75%  Right lateral flexion 50%  Left lateral flexion 50%  Right rotation 75%  Left rotation 75%   (Blank rows =  not tested)  LOWER EXTREMITY MMT:    MMT Right eval Left eval  Hip flexion 4- 4  Hip extension 3 3  Hip abduction 4- 4-  Hip adduction    Hip internal rotation    Hip external rotation    Knee flexion    Knee extension    Ankle dorsiflexion    Ankle plantarflexion    Ankle inversion    Ankle eversion     (Blank rows = not tested)  LUMBAR SPECIAL TESTS:  Straight leg raise test: Negative and FABER test: Negative   GAIT: Distance walked: 100' Assistive device utilized: Single point cane Level of assistance: Modified independence Comments: decreased cadence, small step and stride length  TODAY'S TREATMENT:  [facility] Adult PT Treatment:                                                DATE: 03/15/2023 Therapeutic Exercise: NuStep L6 x Standing rows blueTB --> added squat with row x10 Paloff press blueTB x15 (B) Seated deadlift 15#KB x15 Seated resisted trunk extension purple superband  STS with hip resistance (black superband around waist, anchoring with foot behind chair) Staggered stance balance 2x30"   [facility] Adult PT Treatment:                                                DATE: 03/08/23 Therapeutic Exercise: Nustep L6 x 5 min LEs only Shoulder ext green TB x 15 Pallof press green TB x 10 bilat STS without UE support 2 x 10 Modified dead lift 10#KB 2 x 10 Bridge with ball squeeze 2 x 10 SLR 2# 2 x 10 bilat Trunk extension orange physioball behind back x 12 Seated Hip flexor stretch 10 x 10 sec bilat    [facility] Adult  PT Treatment:                                                DATE: 03/06/23 Therapeutic Exercise: Trunk extension orange physioball behind back x 12 Shoulder ext green TB x 12 Pallof green TB x 10 bilat Seated hip flexor stretch 2 x 30 sec bilat Side step red TB Hip ext red TB 2 x 10 Bridge with ball squeeze 2 x 10 LTR with ball squeeze x 10 SLR 2 x 10 bilat STS without UE support x 10                                                   PATIENT  EDUCATION:  Education details: PT POC and goals, HEP Person educated: Patient and Spouse Education method: Explanation, Facilities manager, and Handouts Education comprehension: verbalized understanding  HOME EXERCISE PROGRAM: Access Code: [email or web address] URL: [email or web address] Date: 03/06/2023 Prepared by: Reggy Eye  Exercises - Seated Hip Flexor Stretch  - 1 x daily - 7 x weekly - 1 sets - 3 reps - 20 seconds hold - Hip Abduction with Resistance Loop  - 1 x daily - 7 x weekly - 3 sets - 10 reps - Hip Extension with Resistance Loop  - 1 x daily - 7 x weekly - 3 sets - 10 reps - Standing Lumbar Extension with Counter  - 1 x daily - 7 x weekly - 3 sets - 10 reps - Sit to Stand Without Arm Support  - 1 x daily - 7 x weekly - 2 sets - 10 reps  ASSESSMENT:  CLINICAL IMPRESSION: Functional core and LE strengthening addressed with squat variations; cueing improved upright standing posture, although patient fatigues quickly. Resisted added with trunk extension and sit to stand to promote upright standing posture and progress posterior strength.  EVAL: Patient is a 77 y.o. male who was seen today for physical therapy evaluation and treatment for spondylolisthesis of lumbar region. Pt presents with forward flexed trunk, decreased trunk ROM, decreased hip and core strength, decreased standing and walking tolerance and increased pain. Pt will benefit from skilled PT to address deficits an improve functional mobility and activity tolerance.     GOALS: Goals reviewed with patient? Yes  SHORT TERM GOALS: Target date: 7/30/202  Pt will be independent with initial HEP Baseline: Goal status: MET  LONG TERM GOALS: Target date: 04/04/2023  Pt will be independent with advanced HEP Baseline:  Goal status: INITIAL  2.  Pt will improve bilat LE strength to 4+/5 to improve standing and walking tolerance Baseline:  Goal status: INITIAL  3.  Pt will tolerate lifting 15# floor to chest  with pain <= 1/10 Baseline:  Goal status: INITIAL  4.  Pt will tolerate standing x 20 minutes to perform leisure activities with pain <= 1/10 Baseline:  Goal status: INITIAL   PLAN:  PT FREQUENCY: 2x/week  PT DURATION: 8 weeks  PLANNED INTERVENTIONS: Therapeutic exercises, Therapeutic activity, Neuromuscular re-education, Balance training, Gait training, Patient/Family education, Self Care, Joint mobilization, Aquatic Therapy, Dry Needling, Cryotherapy, Moist heat, Taping, Ionotophoresis 4mg /ml Dexamethasone, Manual therapy, and Re-evaluation.  PLAN FOR NEXT SESSION:  Continue core and hip strength, balance; trunk and hip extension  Sanjuana Mae, PTA 03/15/2023, 2:05 PM

## 2023-03-16 ENCOUNTER — Other Ambulatory Visit: Payer: Self-pay | Admitting: Nurse Practitioner

## 2023-03-16 ENCOUNTER — Ambulatory Visit: Payer: Medicare Other | Admitting: Sports Medicine

## 2023-03-16 DIAGNOSIS — N3281 Overactive bladder: Secondary | ICD-10-CM

## 2023-03-21 ENCOUNTER — Encounter: Payer: Self-pay | Admitting: Physical Therapy

## 2023-03-21 ENCOUNTER — Ambulatory Visit: Payer: Medicare Other | Admitting: Physical Therapy

## 2023-03-21 DIAGNOSIS — M5459 Other low back pain: Secondary | ICD-10-CM | POA: Diagnosis not present

## 2023-03-21 DIAGNOSIS — M6281 Muscle weakness (generalized): Secondary | ICD-10-CM | POA: Diagnosis not present

## 2023-03-21 NOTE — Therapy (Signed)
OUTPATIENT PHYSICAL THERAPY THORACOLUMBAR TREATMENT   Patient Name: Leroy Griffin MRN: [id number] DOB:28-Jan-1946, 77 y.o., male Today's Date: 03/21/2023  END OF SESSION:  PT End of Session - 03/21/23 1359     Visit Number 6    Number of Visits 16    Date for PT Re-Evaluation 04/04/23    Authorization Type Medicare    Authorization - Visit Number 6    Progress Note Due on Visit 10    PT Start Time 1315    PT Stop Time 1400    PT Time Calculation (min) 45 min    Activity Tolerance Patient tolerated treatment well    Behavior During Therapy [facility] for tasks assessed/performed                Past Medical History:  Diagnosis Date   CAD (coronary artery disease)    Essential tremor    Gastric ulcer 01/14/2015   Gout    Hyperlipidemia    Hypertension    Parkinson's disease    Prediabetes    Proteinuria    Vitamin D deficiency    Past Surgical History:  Procedure Laterality Date   CHOLECYSTECTOMY     CYST EXCISION     back   KNEE SURGERY Right    PACEMAKER IMPLANT     TONSILLECTOMY     Patient Active Problem List   Diagnosis Date Noted   Ankylosing spondylitis (HCC) 01/19/2023   Spondylolisthesis of lumbar region 01/17/2023   S/P placement of cardiac pacemaker 11/18/2020   Other abnormalities of gait and mobility 01/17/2019   Depression due to physical illness 01/17/2019   Overactive bladder 01/11/2018   S/P drug eluting coronary stent placement 11/21/2017   CAD in native artery 11/20/2017   Anxiety 11/14/2017   Overweight (BMI 25.0-29.9) 08/26/2014   Sick sinus syndrome (HCC) 10/10/2013   Medication management 10/06/2013   Hyperlipidemia 08/27/2013   Essential tremor    Proteinuria    Other abnormal glucose    Gout    Vitamin D deficiency    Essential hypertension 10/10/2010    PCP: Oneta Rack  REFERRING PROVIDER: Benjamin Stain  REFERRING DIAG: spondylolisthesis of lumbar region  Rationale for Evaluation and Treatment: Rehabilitation  THERAPY DIAG:   Other low back pain  Muscle weakness (generalized)  ONSET DATE: 12/2022  SUBJECTIVE:                                                                                                                                                                                           SUBJECTIVE STATEMENT: Patient reports he has been able to walk more without his cane and he has  been able to stand for longer without pain or fatigue.   PERTINENT HISTORY:  Ankylosing spondylitis Bilateral knee pain  PAIN:  Are you having pain? Yes: NPRS scale: 0/10 at rest, up to 02/14/09 Pain location: low back Pain description: high discomfort Aggravating factors: lift, stand, prolonged walking Relieving factors: rest, meds  PRECAUTIONS: ICD/Pacemaker  WEIGHT BEARING RESTRICTIONS: No  FALLS:  Has patient fallen in last 6 months? 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   Mild to moderate multilevel degenerative disc disease above this level.   Moderate lower lumbar predominant facet arthropathy.   SENSATION: WFL  MUSCLE LENGTH: Hamstrings: Right 90 deg; Left 90 deg Thomas test: (+) bilat  POSTURE: decreased lumbar lordosis and flexed trunk   PALPATION: TTP with gentle pressure L3 spinous process Increased mm spasticity bilat lumbar paraspinals  LUMBAR ROM:   AROM eval  Flexion WFL  Extension 75%  Right lateral flexion 50%  Left lateral flexion 50%  Right rotation  75%  Left rotation 75%   (Blank rows = not tested)  LOWER EXTREMITY MMT:    MMT Right eval Left eval  Hip flexion 4- 4  Hip extension 3 3  Hip abduction 4- 4-  Hip adduction    Hip internal rotation    Hip external rotation    Knee flexion    Knee extension    Ankle dorsiflexion    Ankle plantarflexion    Ankle inversion    Ankle eversion     (Blank rows = not tested)  LUMBAR SPECIAL TESTS:  Straight leg raise test: Negative and FABER test: Negative   GAIT: Distance walked: 100' Assistive device utilized: Single point cane Level of assistance: Modified independence Comments: decreased cadence, small step and stride length  TODAY'S TREATMENT:  [facility] Adult PT Treatment:                                                DATE: 03/21/23 Therapeutic Exercise: Rows blue TB 2 x 10 Pallof press blue TB x 15 bilat Modified dead lift 15# x 10 Seated trunk extension black power band x 12 Sit to stand with black power band at hips x 10 Stagger stance balance with 5# KB swing 2 x 30 sec Standing balance with 5#KB circle around waist x 30 Resisted walking fwd/bkwd 10# x 5 - CGA Side step red TB at counter Static balance narrow BOS: head nods, head turns, heel raises (CGA) Standing balance tapping forward/backward min A  [facility] Adult PT Treatment:                                                DATE: 03/15/2023 Therapeutic Exercise: NuStep L6 x Standing rows blueTB --> added squat with row x10 Paloff press  blueTB x15 (B) Seated deadlift 15#KB x15 Seated resisted trunk extension purple superband  STS with hip resistance (black superband around waist, anchoring with foot behind chair) Staggered stance balance 2x30"   [facility] Adult PT Treatment:                                                DATE: 03/08/23 Therapeutic Exercise: Nustep L6 x 5 min LEs only Shoulder ext green TB x 15 Pallof press green TB x 10 bilat STS without UE support 2 x 10 Modified dead lift 10#KB 2 x  10 Bridge with ball squeeze 2 x 10 SLR 2# 2 x 10 bilat Trunk extension orange physioball behind back x 12 Seated Hip flexor stretch 10 x 10 sec bilat                                                   PATIENT EDUCATION:  Education details: PT POC and goals, HEP Person educated: Patient and Spouse Education method: Explanation, Facilities manager, and Handouts Education comprehension: verbalized understanding  HOME EXERCISE PROGRAM: Access Code: [email or web address] URL: [email or web address] Date: 03/06/2023 Prepared by: Reggy Eye  Exercises - Seated Hip Flexor Stretch  - 1 x daily - 7 x weekly - 1 sets - 3 reps - 20 seconds hold - Hip Abduction with Resistance Loop  - 1 x daily - 7 x weekly - 3 sets - 10 reps - Hip Extension with Resistance Loop  - 1 x daily - 7 x weekly - 3 sets - 10 reps - Standing Lumbar Extension with Counter  - 1 x daily - 7 x weekly - 3 sets - 10 reps - Sit to Stand Without Arm Support  - 1 x daily - 7 x weekly - 2 sets - 10 reps  ASSESSMENT:  CLINICAL IMPRESSION: Progressed balance training per pt and wife request and to reduce risk of falls. Pt states his back is getting stronger but he feels pain after about 5 minutes of standing  EVAL: Patient is a 77 y.o. male who was seen today for physical therapy evaluation and treatment for spondylolisthesis of lumbar region. Pt presents with forward flexed trunk, decreased trunk ROM, decreased hip and core strength, decreased standing and walking tolerance and increased pain. Pt will benefit from skilled PT to address deficits an improve functional mobility and activity tolerance.     GOALS: Goals reviewed with patient? Yes  SHORT TERM GOALS: Target date: 7/30/202  Pt will be independent with initial HEP Baseline: Goal status: MET  LONG TERM GOALS: Target date: 04/04/2023  Pt will be independent with advanced HEP Baseline:  Goal status: INITIAL  2.  Pt will improve bilat LE strength to 4+/5 to  improve standing and walking tolerance Baseline:  Goal status: INITIAL  3.  Pt will tolerate lifting 15# floor to chest with pain <= 1/10 Baseline:  Goal status: INITIAL  4.  Pt will tolerate standing x 20 minutes to perform leisure activities with pain <= 1/10 Baseline:  Goal status: INITIAL   PLAN:  PT FREQUENCY: 2x/week  PT DURATION: 8 weeks  PLANNED INTERVENTIONS: Therapeutic exercises, Therapeutic activity, Neuromuscular re-education, Balance training, Gait training, Patient/Family education, Self Care, Joint mobilization, Aquatic Therapy, Dry  Needling, Cryotherapy, Moist heat, Taping, Ionotophoresis 4mg /ml Dexamethasone, Manual therapy, and Re-evaluation.  PLAN FOR NEXT SESSION:  Continue core and hip strength, balance; trunk and hip extension   ,, PT 03/21/2023, 2:00 PM

## 2023-03-23 ENCOUNTER — Ambulatory Visit: Payer: Medicare Other

## 2023-03-23 DIAGNOSIS — M5459 Other low back pain: Secondary | ICD-10-CM | POA: Diagnosis not present

## 2023-03-23 DIAGNOSIS — M6281 Muscle weakness (generalized): Secondary | ICD-10-CM | POA: Diagnosis not present

## 2023-03-23 NOTE — Therapy (Signed)
OUTPATIENT PHYSICAL THERAPY THORACOLUMBAR TREATMENT   Patient Name: Leroy Griffin MRN: [id number] DOB:May 08, 1946, 77 y.o., male Today's Date: 03/23/2023  END OF SESSION:  PT End of Session - 03/23/23 1445     Visit Number 7    Number of Visits 16    Date for PT Re-Evaluation 04/04/23    Authorization Type Medicare    Authorization - Visit Number 7    Progress Note Due on Visit 10    PT Start Time 1445    PT Stop Time 1530    PT Time Calculation (min) 45 min    Activity Tolerance Patient tolerated treatment well    Behavior During Therapy [facility] for tasks assessed/performed                Past Medical History:  Diagnosis Date   CAD (coronary artery disease)    Essential tremor    Gastric ulcer 01/14/2015   Gout    Hyperlipidemia    Hypertension    Parkinson's disease    Prediabetes    Proteinuria    Vitamin D deficiency    Past Surgical History:  Procedure Laterality Date   CHOLECYSTECTOMY     CYST EXCISION     back   KNEE SURGERY Right    PACEMAKER IMPLANT     TONSILLECTOMY     Patient Active Problem List   Diagnosis Date Noted   Ankylosing spondylitis (HCC) 01/19/2023   Spondylolisthesis of lumbar region 01/17/2023   S/P placement of cardiac pacemaker 11/18/2020   Other abnormalities of gait and mobility 01/17/2019   Depression due to physical illness 01/17/2019   Overactive bladder 01/11/2018   S/P drug eluting coronary stent placement 11/21/2017   CAD in native artery 11/20/2017   Anxiety 11/14/2017   Overweight (BMI 25.0-29.9) 08/26/2014   Sick sinus syndrome (HCC) 10/10/2013   Medication management 10/06/2013   Hyperlipidemia 08/27/2013   Essential tremor    Proteinuria    Other abnormal glucose    Gout    Vitamin D deficiency    Essential hypertension 10/10/2010    PCP: Oneta Rack  REFERRING PROVIDER: Benjamin Stain  REFERRING DIAG: spondylolisthesis of lumbar region  Rationale for Evaluation and Treatment: Rehabilitation  THERAPY DIAG:   Other low back pain  Muscle weakness (generalized)  ONSET DATE: 12/2022  SUBJECTIVE:                                                                                                                                                                                           SUBJECTIVE STATEMENT: Patient reports he continues to have pain in low back when standing for longer than  5 minutes. Patient reports no back pain so far today; states he did his exercises earlier today.  PERTINENT HISTORY:  Ankylosing spondylitis Bilateral knee pain  PAIN:  Are you having pain? Yes: NPRS scale: 0/10 at rest, up to 02/14/09 Pain location: low back Pain description: high discomfort Aggravating factors: lift, stand, prolonged walking Relieving factors: rest, meds  PRECAUTIONS: ICD/Pacemaker  WEIGHT BEARING RESTRICTIONS: No  FALLS:  Has patient fallen in last 6 months? 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   Mild to moderate multilevel degenerative disc disease above this level.   Moderate lower lumbar predominant facet arthropathy.   SENSATION: WFL  MUSCLE LENGTH: Hamstrings: Right 90 deg; Left 90 deg Thomas test: (+) bilat  POSTURE: decreased lumbar lordosis and flexed trunk   PALPATION: TTP with gentle pressure L3 spinous process Increased mm spasticity bilat lumbar paraspinals  LUMBAR ROM:   AROM eval  Flexion WFL  Extension 75%  Right lateral  flexion 50%  Left lateral flexion 50%  Right rotation 75%  Left rotation 75%   (Blank rows = not tested)  LOWER EXTREMITY MMT:    MMT Right eval Left eval Right 03/23/23 Left  03/23/23  Hip flexion 4- 4 4 4   Hip extension 3 3 4 4   Hip abduction 4- 4- 4+ 4+  Hip adduction      Hip internal rotation      Hip external rotation      Knee flexion      Knee extension      Ankle dorsiflexion      Ankle plantarflexion      Ankle inversion      Ankle eversion       (Blank rows = not tested)  LUMBAR SPECIAL TESTS:  Straight leg raise test: Negative and FABER test: Negative   GAIT: Distance walked: 100' Assistive device utilized: Single point cane Level of assistance: Modified independence Comments: decreased cadence, small step and stride length  TODAY'S TREATMENT:  [facility] Adult PT Treatment:                                                DATE: 03/23/2023 Therapeutic Exercise: Standing spinal extension stretch Hip MMT (see above) Prone on elbows stretch Quadruped hip extension with child's pose rest between sets Standing squat dead lift --> added 15# box lift floor <--> chest Therapeutic Activity: Walking + head turns & nods (SPC) Side stepping + SPC chest press Discussed side stepping at counter for target cue hips forward to promote upright standing posture   [facility] Adult PT Treatment:                                                DATE: 03/21/23 Therapeutic Exercise: Rows blue  TB 2 x 10 Pallof press blue TB x 15 bilat Modified dead lift 15# x 10 Seated trunk extension black power band x 12 Sit to stand with black power band at hips x 10 Stagger stance balance with 5# KB swing 2 x 30 sec Standing balance with 5#KB circle around waist x 30 Resisted walking fwd/bkwd 10# x 5 - CGA Side step red TB at counter Static balance narrow BOS: head nods, head turns, heel raises (CGA) Standing balance tapping forward/backward min A   [facility] Adult PT Treatment:                                                 DATE: 03/15/2023 Therapeutic Exercise: NuStep L6 x Standing rows blueTB --> added squat with row x10 Paloff press blueTB x15 (B) Seated deadlift 15#KB x15 Seated resisted trunk extension purple superband  STS with hip resistance (black superband around waist, anchoring with foot behind chair) Staggered stance balance 2x30"                                                   PATIENT EDUCATION:  Education details: Updated HEP Person educated: Patient and Spouse Education method: Explanation, Demonstration, and Handouts Education comprehension: verbalized understanding  HOME EXERCISE PROGRAM: Access Code: [email or web address] URL: [email or web address] Date: 03/23/2023 Prepared by: Carlynn Herald  Exercises - Seated Hip Flexor Stretch  - 1 x daily - 7 x weekly - 1 sets - 3 reps - 20 seconds hold - Hip Abduction with Resistance Loop  - 1 x daily - 7 x weekly - 3 sets - 10 reps - Hip Extension with Resistance Loop  - 1 x daily - 7 x weekly - 3 sets - 10 reps - Standing Lumbar Extension with Counter  - 1 x daily - 7 x weekly - 3 sets - 10 reps - Sit to Stand Without Arm Support  - 1 x daily - 7 x weekly - 2 sets - 10 reps - Semi-Tandem Balance at [employer] Eyes Open  - 1 x daily - 7 x weekly - 3 sets - 10 reps - Semi-Tandem Balance at [employer] Eyes Closed  - 1 x daily - 7 x weekly - 3 sets - 10 reps - Prone on Elbows Stretch  - 1 x daily - 7 x weekly - 3 sets - 10 reps - Supine Bridge  - 1 x daily - 7 x weekly - 3 sets - 10 reps - Walking with Head Rotation  - 1 x daily - 7 x weekly - 3 sets - 10 reps - Side Stepping with Counter Support  - 1 x daily - 7 x weekly - 3 sets - 10 reps  ASSESSMENT:  CLINICAL IMPRESSION: Bilateral hip strength is improving (updated MMT above); hip flexor strength continues to be slightly weaker compared to hip extension and abduction. Patient demonstrated good body mechanics during squats and box lifts. Dynamic balance and  postural stability challenged with added head turns during walking; greater instability noted with head turns to left. Improved foot clearance noted during ambulatory exercises.  GOALS: Goals reviewed with patient? Yes  SHORT TERM GOALS: Target date: 7/30/202  Pt will be independent with initial  HEP Baseline: Goal status: MET  LONG TERM GOALS: Target date: 04/04/2023  Pt will be independent with advanced HEP Baseline:  Goal status: MET  2.  Pt will improve bilat LE strength to 4+/5 to improve standing and walking tolerance Baseline: see above Goal status: IN PROGRESS  3.  Pt will tolerate lifting 15# floor to chest with pain <= 1/10 Baseline:  Goal status: MET  4.  Pt will tolerate standing x 20 minutes to perform leisure activities with pain <= 1/10 Baseline: 5/10 pain Goal status: IN PROGRESS   PLAN:  PT FREQUENCY: 2x/week  PT DURATION: 8 weeks  PLANNED INTERVENTIONS: Therapeutic exercises, Therapeutic activity, Neuromuscular re-education, Balance training, Gait training, Patient/Family education, Self Care, Joint mobilization, Aquatic Therapy, Dry Needling, Cryotherapy, Moist heat, Taping, Ionotophoresis 4mg /ml Dexamethasone, Manual therapy, and Re-evaluation.  PLAN FOR NEXT SESSION:  Continue core and hip strength, balance; trunk and hip extension   Sanjuana Mae, PTA 03/23/2023, 3:44 PM

## 2023-03-28 ENCOUNTER — Ambulatory Visit: Payer: Medicare Other | Admitting: Physical Therapy

## 2023-03-28 ENCOUNTER — Encounter: Payer: Self-pay | Admitting: Physical Therapy

## 2023-03-28 DIAGNOSIS — M6281 Muscle weakness (generalized): Secondary | ICD-10-CM

## 2023-03-28 DIAGNOSIS — M5459 Other low back pain: Secondary | ICD-10-CM | POA: Diagnosis not present

## 2023-03-28 NOTE — Therapy (Signed)
OUTPATIENT PHYSICAL THERAPY THORACOLUMBAR TREATMENT   Patient Name: Leroy Griffin MRN: [id number] DOB:Jul 15, 1946, 77 y.o., male Today's Date: 03/28/2023  END OF SESSION:  PT End of Session - 03/28/23 1450     Visit Number 8    Number of Visits 16    Date for PT Re-Evaluation 04/04/23    Authorization Type Medicare    Authorization - Visit Number 8    Progress Note Due on Visit 10    PT Start Time 1405    PT Stop Time 1450    PT Time Calculation (min) 45 min    Activity Tolerance Patient tolerated treatment well    Behavior During Therapy [facility] for tasks assessed/performed                 Past Medical History:  Diagnosis Date   CAD (coronary artery disease)    Essential tremor    Gastric ulcer 01/14/2015   Gout    Hyperlipidemia    Hypertension    Parkinson's disease    Prediabetes    Proteinuria    Vitamin D deficiency    Past Surgical History:  Procedure Laterality Date   CHOLECYSTECTOMY     CYST EXCISION     back   KNEE SURGERY Right    PACEMAKER IMPLANT     TONSILLECTOMY     Patient Active Problem List   Diagnosis Date Noted   Ankylosing spondylitis (HCC) 01/19/2023   Spondylolisthesis of lumbar region 01/17/2023   S/P placement of cardiac pacemaker 11/18/2020   Other abnormalities of gait and mobility 01/17/2019   Depression due to physical illness 01/17/2019   Overactive bladder 01/11/2018   S/P drug eluting coronary stent placement 11/21/2017   CAD in native artery 11/20/2017   Anxiety 11/14/2017   Overweight (BMI 25.0-29.9) 08/26/2014   Sick sinus syndrome (HCC) 10/10/2013   Medication management 10/06/2013   Hyperlipidemia 08/27/2013   Essential tremor    Proteinuria    Other abnormal glucose    Gout    Vitamin D deficiency    Essential hypertension 10/10/2010    PCP: Oneta Rack  REFERRING PROVIDER: Benjamin Stain  REFERRING DIAG: spondylolisthesis of lumbar region  Rationale for Evaluation and Treatment: Rehabilitation  THERAPY  DIAG:  Other low back pain  Muscle weakness (generalized)  ONSET DATE: 12/2022  SUBJECTIVE:                                                                                                                                                                                           SUBJECTIVE STATEMENT: Patient reports he worked on his tractor yesterday and was sore for a few  hours  PERTINENT HISTORY:  Ankylosing spondylitis Bilateral knee pain  PAIN:  Are you having pain? Yes: NPRS scale: 0/10 at rest, up to 02/14/09 Pain location: low back Pain description: high discomfort Aggravating factors: lift, stand, prolonged walking Relieving factors: rest, meds  PRECAUTIONS: ICD/Pacemaker  WEIGHT BEARING RESTRICTIONS: No  FALLS:  Has patient fallen in last 6 months? 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   Mild to moderate multilevel degenerative disc disease above this level.   Moderate lower lumbar predominant facet arthropathy.   SENSATION: WFL  MUSCLE LENGTH: Hamstrings: Right 90 deg; Left 90 deg Thomas test: (+) bilat  POSTURE: decreased lumbar lordosis and flexed trunk   PALPATION: TTP with gentle pressure L3 spinous process Increased mm spasticity bilat lumbar paraspinals  LUMBAR ROM:   AROM eval  Flexion WFL  Extension 75%  Right lateral flexion 50%  Left lateral flexion 50%  Right rotation 75%  Left rotation 75%   (Blank rows  = not tested)  LOWER EXTREMITY MMT:    MMT Right eval Left eval Right 03/23/23 Left  03/23/23  Hip flexion 4- 4 4 4   Hip extension 3 3 4 4   Hip abduction 4- 4- 4+ 4+  Hip adduction      Hip internal rotation      Hip external rotation      Knee flexion      Knee extension      Ankle dorsiflexion      Ankle plantarflexion      Ankle inversion      Ankle eversion       (Blank rows = not tested)  LUMBAR SPECIAL TESTS:  Straight leg raise test: Negative and FABER test: Negative   GAIT: Distance walked: 100' Assistive device utilized: Single point cane Level of assistance: Modified independence Comments: decreased cadence, small step and stride length  TODAY'S TREATMENT:  [facility] Adult PT Treatment:                                                DATE: 03/28/23 Therapeutic Exercise: Standing trunk extension stretch Sled push/pull Modified dead lift 15# 2 x 10 Squat tap 15# KB x 15 Suitcase carry 10# x 2 laps Standing hip ext with knee flexion 2 x 10 Physioball roll up wall for trunk extension  Therapeutic Activity: Gait with head turns, nods Gait without AD - pt with anterior LOB with min A to correct Side step at counter with reaching up to promote trunk extension   [facility] Adult PT Treatment:                                                DATE: 03/23/2023 Therapeutic Exercise: Standing spinal extension stretch Hip MMT (see above)  Prone on elbows stretch Quadruped hip extension with child's pose rest between sets Standing squat dead lift --> added 15# box lift floor <--> chest Therapeutic Activity: Walking + head turns & nods (SPC) Side stepping + SPC chest press Discussed side stepping at counter for target cue hips forward to promote upright standing posture   [facility] Adult PT Treatment:                                                DATE: 03/21/23 Therapeutic Exercise: Rows blue TB 2 x 10 Pallof press blue TB x 15 bilat Modified dead lift 15# x 10 Seated trunk  extension black power band x 12 Sit to stand with black power band at hips x 10 Stagger stance balance with 5# KB swing 2 x 30 sec Standing balance with 5#KB circle around waist x 30 Resisted walking fwd/bkwd 10# x 5 - CGA Side step red TB at counter Static balance narrow BOS: head nods, head turns, heel raises (CGA) Standing balance tapping forward/backward min A                                                  PATIENT EDUCATION:  Education details: Updated HEP Person educated: Patient and Spouse Education method: Explanation, Demonstration, and Handouts Education comprehension: verbalized understanding  HOME EXERCISE PROGRAM: Access Code: [email or web address] URL: [email or web address] Date: 03/23/2023 Prepared by: Carlynn Herald  Exercises - Seated Hip Flexor Stretch  - 1 x daily - 7 x weekly - 1 sets - 3 reps - 20 seconds hold - Hip Abduction with Resistance Loop  - 1 x daily - 7 x weekly - 3 sets - 10 reps - Hip Extension with Resistance Loop  - 1 x daily - 7 x weekly - 3 sets - 10 reps - Standing Lumbar Extension with Counter  - 1 x daily - 7 x weekly - 3 sets - 10 reps - Sit to Stand Without Arm Support  - 1 x daily - 7 x weekly - 2 sets - 10 reps - Semi-Tandem Balance at [employer] Eyes Open  - 1 x daily - 7 x weekly - 3 sets - 10 reps - Semi-Tandem Balance at [employer] Eyes Closed  - 1 x daily - 7 x weekly - 3 sets - 10 reps - Prone on Elbows Stretch  - 1 x daily - 7 x weekly - 3 sets - 10 reps - Supine Bridge  - 1 x daily - 7 x weekly - 3 sets - 10 reps - Walking with Head Rotation  - 1 x daily - 7 x weekly - 3 sets - 10 reps - Side Stepping with Counter Support  - 1 x daily - 7 x weekly - 3 sets - 10 reps  ASSESSMENT:  CLINICAL IMPRESSION: Pt is continuing to work towards goals. Continued to progress balance with head turns and suitcase carry. Some anterior LOB when walking without AD - he will continue to benefit from glute/hip extensor strength  GOALS: Goals  reviewed with patient? Yes  SHORT TERM GOALS: Target date: 7/30/202  Pt will be independent with initial HEP Baseline: Goal status: MET  LONG TERM GOALS: Target date: 04/04/2023  Pt will  be independent with advanced HEP Baseline:  Goal status: MET  2.  Pt will improve bilat LE strength to 4+/5 to improve standing and walking tolerance Baseline: see above Goal status: IN PROGRESS  3.  Pt will tolerate lifting 15# floor to chest with pain <= 1/10 Baseline:  Goal status: MET  4.  Pt will tolerate standing x 20 minutes to perform leisure activities with pain <= 1/10 Baseline: 5/10 pain Goal status: IN PROGRESS   PLAN:  PT FREQUENCY: 2x/week  PT DURATION: 8 weeks  PLANNED INTERVENTIONS: Therapeutic exercises, Therapeutic activity, Neuromuscular re-education, Balance training, Gait training, Patient/Family education, Self Care, Joint mobilization, Aquatic Therapy, Dry Needling, Cryotherapy, Moist heat, Taping, Ionotophoresis 4mg /ml Dexamethasone, Manual therapy, and Re-evaluation.  PLAN FOR NEXT SESSION:  Continue core and hip strength, balance; trunk and hip extension   Sammie Denner, PT 03/28/2023, 2:51 PM

## 2023-03-30 ENCOUNTER — Ambulatory Visit: Payer: Medicare Other

## 2023-03-30 DIAGNOSIS — M6281 Muscle weakness (generalized): Secondary | ICD-10-CM | POA: Diagnosis not present

## 2023-03-30 DIAGNOSIS — M5459 Other low back pain: Secondary | ICD-10-CM | POA: Diagnosis not present

## 2023-03-30 NOTE — Therapy (Signed)
OUTPATIENT PHYSICAL THERAPY THORACOLUMBAR TREATMENT   Patient Name: Leroy Griffin MRN: [id number] DOB:1945/11/19, 77 y.o., male Today's Date: 03/30/2023  END OF SESSION:  PT End of Session - 03/30/23 1402     Visit Number 9    Number of Visits 16    Date for PT Re-Evaluation 04/04/23    Authorization Type Medicare    Authorization - Visit Number 9    Progress Note Due on Visit 10    PT Start Time 1402    PT Stop Time 1447    PT Time Calculation (min) 45 min    Activity Tolerance Patient tolerated treatment well    Behavior During Therapy [facility] for tasks assessed/performed                 Past Medical History:  Diagnosis Date   CAD (coronary artery disease)    Essential tremor    Gastric ulcer 01/14/2015   Gout    Hyperlipidemia    Hypertension    Parkinson's disease    Prediabetes    Proteinuria    Vitamin D deficiency    Past Surgical History:  Procedure Laterality Date   CHOLECYSTECTOMY     CYST EXCISION     back   KNEE SURGERY Right    PACEMAKER IMPLANT     TONSILLECTOMY     Patient Active Problem List   Diagnosis Date Noted   Ankylosing spondylitis (HCC) 01/19/2023   Spondylolisthesis of lumbar region 01/17/2023   S/P placement of cardiac pacemaker 11/18/2020   Other abnormalities of gait and mobility 01/17/2019   Depression due to physical illness 01/17/2019   Overactive bladder 01/11/2018   S/P drug eluting coronary stent placement 11/21/2017   CAD in native artery 11/20/2017   Anxiety 11/14/2017   Overweight (BMI 25.0-29.9) 08/26/2014   Sick sinus syndrome (HCC) 10/10/2013   Medication management 10/06/2013   Hyperlipidemia 08/27/2013   Essential tremor    Proteinuria    Other abnormal glucose    Gout    Vitamin D deficiency    Essential hypertension 10/10/2010    PCP: Oneta Rack  REFERRING PROVIDER: Benjamin Stain  REFERRING DIAG: spondylolisthesis of lumbar region  Rationale for Evaluation and Treatment: Rehabilitation  THERAPY  DIAG:  Other low back pain  Muscle weakness (generalized)  ONSET DATE: 12/2022  SUBJECTIVE:                                                                                                                                                                                           SUBJECTIVE STATEMENT: Patient reports is balance is feeling off today, states he woke up with a  stiff neck.  PERTINENT HISTORY:  Ankylosing spondylitis Bilateral knee pain  PAIN:  Are you having pain? Yes: NPRS scale: 0/10 at rest, up to /10 Pain location: low back Pain description: high discomfort Aggravating factors: lift, stand, prolonged walking Relieving factors: rest, meds  PRECAUTIONS: ICD/Pacemaker  WEIGHT BEARING RESTRICTIONS: No  FALLS:  Has patient fallen in last 6 months? 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   Mild to moderate multilevel degenerative disc disease above this level.   Moderate lower lumbar predominant facet arthropathy.   SENSATION: WFL  MUSCLE LENGTH: Hamstrings: Right 90 deg; Left 90 deg Thomas test: (+) bilat  POSTURE: decreased lumbar lordosis and flexed trunk   PALPATION: TTP with gentle pressure L3 spinous process Increased mm spasticity bilat lumbar paraspinals  LUMBAR ROM:   AROM eval  Flexion WFL  Extension 75%  Right lateral flexion 50%  Left lateral flexion 50%  Right rotation 75%  Left rotation 75%   (Blank  rows = not tested)  LOWER EXTREMITY MMT:    MMT Right eval Left eval Right 03/23/23 Left  03/23/23  Hip flexion 4- 4 4 4   Hip extension 3 3 4 4   Hip abduction 4- 4- 4+ 4+  Hip adduction      Hip internal rotation      Hip external rotation      Knee flexion      Knee extension      Ankle dorsiflexion      Ankle plantarflexion      Ankle inversion      Ankle eversion       (Blank rows = not tested)  LUMBAR SPECIAL TESTS:  Straight leg raise test: Negative and FABER test: Negative   GAIT: Distance walked: 100' Assistive device utilized: Single point cane Level of assistance: Modified independence Comments: decreased cadence, small step and stride length  TODAY'S TREATMENT:  [facility] Adult PT Treatment:                                                DATE: 03/30/2023 Therapeutic Exercise: Standing lumbar extension stretch at high table Standing deadlift with orange superband x10 Prone press up on elbows  Squats with seat tap on table -->gradually lowering Therapeutic Activity: Standing in corner, chair in front (SBA) --> standing on airex, slow head turns/nods Tandem walking with high table for support Tandem stance balance Weight shifting on R LE --> R SLB + glute activation    [facility] Adult PT Treatment:                                                DATE: 03/28/23 Therapeutic Exercise: Standing trunk extension stretch Sled push/pull Modified dead lift 15# 2 x 10 Squat tap  15# KB x 15 Suitcase carry 10# x 2 laps Standing hip ext with knee flexion 2 x 10 Physioball roll up wall for trunk extension Therapeutic Activity: Gait with head turns, nods Gait without AD - pt with anterior LOB with min A to correct Side step at counter with reaching up to promote trunk extension    [facility] Adult PT Treatment:                                                DATE: 03/23/2023 Therapeutic Exercise: Standing spinal extension stretch Hip MMT (see above) Prone on elbows  stretch Quadruped hip extension with child's pose rest between sets Standing squat dead lift --> added 15# box lift floor <--> chest Therapeutic Activity: Walking + head turns & nods (SPC) Side stepping + SPC chest press Discussed side stepping at counter for target cue hips forward to promote upright standing posture                                                 PATIENT EDUCATION:  Education details: Updated HEP Person educated: Patient and Spouse Education method: Explanation, Facilities manager, and Handouts Education comprehension: verbalized understanding  HOME EXERCISE PROGRAM: Access Code: [email or web address] URL: [email or web address] Date: 03/30/2023 Prepared by: Carlynn Herald  Exercises - Seated Hip Flexor Stretch  - 1 x daily - 7 x weekly - 1 sets - 3 reps - 20 seconds hold - Hip Abduction with Resistance Loop  - 1 x daily - 7 x weekly - 3 sets - 10 reps - Hip Extension with Resistance Loop  - 1 x daily - 7 x weekly - 3 sets - 10 reps - Standing Lumbar Extension with Counter  - 1 x daily - 7 x weekly - 3 sets - 10 reps - Sit to Stand Without Arm Support  - 1 x daily - 7 x weekly - 2 sets - 10 reps - Semi-Tandem Balance at [employer] Eyes Open  - 1 x daily - 7 x weekly - 3 sets - 10 reps - Semi-Tandem Balance at [employer] Eyes Closed  - 1 x daily - 7 x weekly - 3 sets - 10 reps - Prone on Elbows Stretch  - 1 x daily - 7 x weekly - 3 sets - 10 reps - Supine Bridge  - 1 x daily - 7 x weekly - 3 sets - 10 reps - Walking with Head Rotation  - 1 x daily - 7 x weekly - 3 sets - 10 reps - Side Stepping with Counter Support  - 1 x daily - 7 x weekly - 3 sets - 10 reps - Prone Press Up On Elbows  - 1 x daily - 7 x weekly - 1 sets - 10 reps - 20-30 sec hold - Single Leg Stance with Support  - 1 x daily - 7 x weekly - 1 sets - 5 reps - 20-30 hold  ASSESSMENT:  CLINICAL IMPRESSION:  Postural instability noted with patient looking up while standing on compliant surface. Cueing  provided to improve R glute activation in single leg stance to improve stability and postural alignment.   GOALS: Goals reviewed with patient? Yes  SHORT TERM GOALS: Target date: 7/30/202  Pt will be independent with initial HEP Baseline: Goal status: MET  LONG TERM GOALS: Target date: 04/04/2023  Pt will be independent with advanced HEP Baseline:  Goal status: MET  2.  Pt will improve bilat LE strength to 4+/5 to improve standing and walking tolerance Baseline: see above Goal status: IN PROGRESS  3.  Pt will tolerate lifting 15# floor to chest with pain <= 1/10 Baseline:  Goal status: MET  4.  Pt will tolerate standing x 20 minutes to perform leisure activities with pain <= 1/10 Baseline: 5/10 pain Goal status: IN PROGRESS   PLAN:  PT FREQUENCY: 2x/week  PT DURATION: 8 weeks  PLANNED INTERVENTIONS: Therapeutic exercises, Therapeutic activity, Neuromuscular re-education, Balance training, Gait training, Patient/Family education, Self Care, Joint mobilization, Aquatic Therapy, Dry Needling, Cryotherapy, Moist heat, Taping, Ionotophoresis 4mg /ml Dexamethasone, Manual therapy, and Re-evaluation.  PLAN FOR NEXT SESSION:  Re-Eval next visit. Continue core and hip strength, balance; trunk and hip extension   Sanjuana Mae, PTA 03/30/2023, 2:59 PM

## 2023-04-04 ENCOUNTER — Encounter: Payer: Medicare Other | Admitting: Physical Therapy

## 2023-04-06 ENCOUNTER — Ambulatory Visit: Payer: Medicare Other

## 2023-04-06 ENCOUNTER — Ambulatory Visit (INDEPENDENT_AMBULATORY_CARE_PROVIDER_SITE_OTHER): Payer: Medicare Other | Admitting: Sports Medicine

## 2023-04-06 ENCOUNTER — Encounter: Payer: Self-pay | Admitting: Sports Medicine

## 2023-04-06 DIAGNOSIS — M6281 Muscle weakness (generalized): Secondary | ICD-10-CM | POA: Diagnosis not present

## 2023-04-06 DIAGNOSIS — M5459 Other low back pain: Secondary | ICD-10-CM

## 2023-04-06 DIAGNOSIS — G259 Extrapyramidal and movement disorder, unspecified: Secondary | ICD-10-CM | POA: Diagnosis not present

## 2023-04-06 DIAGNOSIS — N3281 Overactive bladder: Secondary | ICD-10-CM

## 2023-04-06 DIAGNOSIS — M4316 Spondylolisthesis, lumbar region: Secondary | ICD-10-CM | POA: Diagnosis not present

## 2023-04-06 MED ORDER — MIRABEGRON ER 50 MG PO TB24: 50.0000 mg | ORAL_TABLET | Freq: Every day | ORAL | 11 refills | Status: DC

## 2023-04-06 NOTE — Assessment & Plan Note (Signed)
Leroy Griffin does continue to have significant difficulties with movement, he does have a tremor, he does have a wide broad based shuffling gait, he does appear to have some bradykinesia now, he has a positive glabellar reflex. He did see Dr. Arbutus Leas with [place] neurology approximately 7 years ago, it sounds that a levodopa challenge test was negative, he had no improvement in his tremor or gait. He also opted out of a DaTscan. His symptoms are worsening with regards to balance and gait, considering his findings on exam today as well as the positive glabellar reflex I would like a second opinion again from neurology, potentially proceeding with a DaTscan. I wonder if additional dopaminergic empiric treatment would be beneficial for him?

## 2023-04-06 NOTE — Therapy (Addendum)
OUTPATIENT PHYSICAL THERAPY THORACOLUMBAR TREATMENT AND RECERTIFICATION AND 10th visit note   Patient Name: Leroy Griffin MRN: [id number] DOB:[birth year], 77 y.o., male Today's Date: 04/07/2023 Dates of service 02/07/23-04/06/23 END OF SESSION:  PT End of Session - 04/06/23 1404     Visit Number 10    Number of Visits 26    Date for PT Re-Evaluation 06/01/23    Authorization Type Medicare    Authorization - Visit Number 10    Progress Note Due on Visit 20    PT Start Time 1405    PT Stop Time 1445    PT Time Calculation (min) 40 min    Activity Tolerance Patient tolerated treatment well    Behavior During Therapy [facility] for tasks assessed/performed                 Past Medical History:  Diagnosis Date   CAD (coronary artery disease)    Essential tremor    Gastric ulcer 01/14/2015   Gout    Hyperlipidemia    Hypertension    Parkinson's disease    Prediabetes    Proteinuria    Vitamin D deficiency    Past Surgical History:  Procedure Laterality Date   CHOLECYSTECTOMY     CYST EXCISION     back   KNEE SURGERY Right    PACEMAKER IMPLANT     TONSILLECTOMY     Patient Active Problem List   Diagnosis Date Noted   Ankylosing spondylitis (HCC) 01/19/2023   Spondylolisthesis of lumbar region 01/17/2023   S/P placement of cardiac pacemaker 11/18/2020   Other abnormalities of gait and mobility 01/17/2019   Depression due to physical illness 01/17/2019   Overactive bladder 01/11/2018   S/P drug eluting coronary stent placement 11/21/2017   CAD in native artery 11/20/2017   Anxiety 11/14/2017   Overweight (BMI 25.0-29.9) 08/26/2014   Sick sinus syndrome (HCC) 10/10/2013   Medication management 10/06/2013   Hyperlipidemia 08/27/2013   Movement disorder    Proteinuria    Other abnormal glucose    Gout    Vitamin D deficiency    Essential hypertension 10/10/2010    PCP: Oneta Rack  REFERRING PROVIDER: Benjamin Stain  REFERRING DIAG: spondylolisthesis of lumbar  region  Rationale for Evaluation and Treatment: Rehabilitation  THERAPY DIAG:  Other low back pain  Muscle weakness (generalized)  ONSET DATE: 12/2022  SUBJECTIVE:                                                                                                                                                                                           SUBJECTIVE STATEMENT: Patient reports he had dental  surgery a week ago and was not feeling good which is why he cancelled last appointment. Patient reports he has no back pain, states he is able to stand for longer before needing to lean against support. Patient states he is not sure if he sees a lot improvement with his balance, however he has not been sleeping well due to dental procedures and stress over repairing his tractor.  PERTINENT HISTORY:  Ankylosing spondylitis Bilateral knee pain  PAIN:  Are you having pain? Yes: NPRS scale: 0/10 at rest, up to /10 Pain location: low back Pain description: high discomfort Aggravating factors: lift, stand, prolonged walking Relieving factors: rest, meds  PRECAUTIONS: ICD/Pacemaker  WEIGHT BEARING RESTRICTIONS: No  FALLS:  Has patient fallen in last 6 months? 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   Mild to moderate multilevel degenerative disc disease above this level.   Moderate lower lumbar predominant facet arthropathy.   SENSATION: WFL  MUSCLE  LENGTH: Hamstrings: Right 90 deg; Left 90 deg Thomas test: (+) bilat  POSTURE: decreased lumbar lordosis and flexed trunk   PALPATION: TTP with gentle pressure L3 spinous process Increased mm spasticity bilat lumbar paraspinals  LUMBAR ROM:   AROM eval  Flexion WFL  Extension 75%  Right lateral flexion 50%  Left lateral flexion 50%  Right rotation 75%  Left rotation 75%   (Blank rows = not tested)  LOWER EXTREMITY MMT:    MMT Right eval Left eval Right 03/23/23 Left  03/23/23 R/L 04/06/23  Hip flexion 4- 4 4 4  4/4+  Hip extension 3 3 4 4  4+/4  Hip abduction 4- 4- 4+ 4+ 4+/4+  Hip adduction       Hip internal rotation       Hip external rotation       Knee flexion       Knee extension       Ankle dorsiflexion       Ankle plantarflexion       Ankle inversion       Ankle eversion        (Blank rows = not tested)  LUMBAR SPECIAL TESTS:  Straight leg raise test: Negative and FABER test: Negative  GAIT: Distance walked: 100' Assistive device utilized: Single point cane Level of assistance: Modified independence Comments: decreased cadence, small step and stride length  TODAY'S TREATMENT:  [facility] Adult PT Treatment:                                                DATE: 04/06/2023 Therapeutic Exercise: Hip MMT Hip flexor stretch: kneeling (no stretch) --> seated edge of table  Therapeutic Activity: Walking with 10#KB suitcase carry (B)  Discussion if incorporating walking program outdoors with walking sticks to promote reciprocal gait pattern and upright standing posture Self Care: Walking program with walking sticks   [facility] Adult PT Treatment:  DATE: 03/30/2023 Therapeutic Exercise: Standing lumbar extension stretch at high table Standing deadlift with orange superband x10 Prone press up on elbows  Squats with seat tap on table -->gradually lowering Therapeutic Activity: Standing in corner, chair in front (SBA) -->  standing on airex, slow head turns/nods Tandem walking with high table for support Tandem stance balance Weight shifting on R LE --> R SLB + glute activation    [facility] Adult PT Treatment:                                                DATE: 03/28/23 Therapeutic Exercise: Standing trunk extension stretch Sled push/pull Modified dead lift 15# 2 x 10 Squat tap 15# KB x 15 Suitcase carry 10# x 2 laps Standing hip ext with knee flexion 2 x 10 Physioball roll up wall for trunk extension Therapeutic Activity: Gait with head turns, nods Gait without AD - pt with anterior LOB with min A to correct Side step at counter with reaching up to promote trunk extension   PATIENT EDUCATION:  Education details: Updated HEP Person educated: Patient and Spouse Education method: Explanation, Demonstration, and Handouts Education comprehension: verbalized understanding  HOME EXERCISE PROGRAM: Access Code: [email or web address] URL: [email or web address] Date: 03/30/2023 Prepared by: Carlynn Herald  Exercises - Seated Hip Flexor Stretch  - 1 x daily - 7 x weekly - 1 sets - 3 reps - 20 seconds hold - Hip Abduction with Resistance Loop  - 1 x daily - 7 x weekly - 3 sets - 10 reps - Hip Extension with Resistance Loop  - 1 x daily - 7 x weekly - 3 sets - 10 reps - Standing Lumbar Extension with Counter  - 1 x daily - 7 x weekly - 3 sets - 10 reps - Sit to Stand Without Arm Support  - 1 x daily - 7 x weekly - 2 sets - 10 reps - Semi-Tandem Balance at [employer] Eyes Open  - 1 x daily - 7 x weekly - 3 sets - 10 reps - Semi-Tandem Balance at [employer] Eyes Closed  - 1 x daily - 7 x weekly - 3 sets - 10 reps - Prone on Elbows Stretch  - 1 x daily - 7 x weekly - 3 sets - 10 reps - Supine Bridge  - 1 x daily - 7 x weekly - 3 sets - 10 reps - Walking with Head Rotation  - 1 x daily - 7 x weekly - 3 sets - 10 reps - Side Stepping with Counter Support  - 1 x daily - 7 x weekly - 3 sets - 10 reps - Prone  Press Up On Elbows  - 1 x daily - 7 x weekly - 1 sets - 10 reps - 20-30 sec hold - Single Leg Stance with Support  - 1 x daily - 7 x weekly - 1 sets - 5 reps - 20-30 hold  ASSESSMENT:  CLINICAL IMPRESSION:  Hip strength improved as measured by MMT; most significant improvement noted with hip extension and mild improvement with hip flexion strength. No significant change in pain with pain during prolonged activity and standing. Discussion with patient on starting walking program with walking sticks to promote reciprocal gait pattern and upright standing posture. Patient states he would like to pause therapy to deal with dental procedures and travel and return in  September to continue working on LE and postural strengthening, balance, and improving tolerance with prolonged physical activity.   GOALS: Goals reviewed with patient? Yes  SHORT TERM GOALS: Target date: 7/30/202  Pt will be independent with initial HEP Baseline: Goal status: MET  LONG TERM GOALS: Target date: 06/01/2023  Pt will be independent with advanced HEP Baseline:  Goal status: MET  2.  Pt will improve bilat LE strength to 4+/5 to improve standing and walking tolerance Baseline: see above Goal status: IN PROGRESS  3.  Pt will tolerate lifting 15# floor to chest with pain <= 1/10 Baseline:  Goal status: MET  4.  Pt will tolerate standing x 20 minutes to perform leisure activities with pain <= 1/10 Baseline: 5/10 pain Goal status: IN PROGRESS  5. Pt will demo improved balance by being able to perform gait without AD x 150' with independence  Goal status: INITIAL  6. Pt will demo improved balance by perform tandem stance without UE support x 30 seconds bilat  Goal status: INITIAL   PLAN:  PT FREQUENCY: 2x/week  PT DURATION: 8 weeks  PLANNED INTERVENTIONS: Therapeutic exercises, Therapeutic activity, Neuromuscular re-education, Balance training, Gait training, Patient/Family education, Self Care, Joint  mobilization, Aquatic Therapy, Dry Needling, Cryotherapy, Moist heat, Taping, Ionotophoresis 4mg /ml Dexamethasone, Manual therapy, and Re-evaluation.  PLAN FOR NEXT SESSION: balance, strength  DONAWERTH,KAREN, PT 04/07/2023, 8:14 AM

## 2023-04-06 NOTE — Progress Notes (Signed)
    Procedures performed today:    None.  Independent interpretation of notes and tests performed by another provider:   None.  Brief History, Exam, Impression, and Recommendations:    Spondylolisthesis of lumbar region This pleasant 47 An[place] male returns, we have been treating him for multifactorial low back pain, he has spondylolisthesis, lumbar spinal stenosis. He also had a positive HLA-B27, we treated him with Celebrex and physical therapy. He has improve dramatically, he has very little pain in his back, I think he should continue therapy be at formal or at home, and we can hold off on advanced imaging for epidural planning.  Movement disorder [place] does continue to have significant difficulties with movement, he does have a tremor, he does have a wide broad based shuffling gait, he does appear to have some bradykinesia now, he has a positive glabellar reflex. He did see Dr. Arbutus Leas with [place] neurolog[facility] approximately 7 years ago, it sounds that a levodopa challenge test was negative, he had no improvement in his tremor or gait. He also opted out of a DaTscan. His symptoms are worsening with regards to balance and gait, considering his findings on exam today as well as the positive glabellar reflex I would like a second opinion again from neurology, potentially proceeding with a DaTscan. I wonder if additional dopaminergic empiric treatment would be beneficial for him?   Overactive bladder Oxybutynin 10 mg XL is highly intolerable, excessive dry mouth, he has had multiple dental problems, difficulty with his vision, mental clarity, he is eager to get off of it. We will try switching to Myrbetriq. If this works he can follow this up with his PCP. For insurance coverage purposes he has tried and failed oxybutynin, he will also have intolerable adverse effects with any anticholinergic drug such as Information systems manager and Detrol.    ____________________________________________ Ihor Austin.  Benjamin Stain, M.D., ABFM., CAQSM., AME. Primary Care and Sports Medicine [place] [facility]  Adjunct Professor of Family Medicine  [employer]  Restaurant manager, fast food

## 2023-04-06 NOTE — Assessment & Plan Note (Signed)
Oxybutynin 10 mg XL is highly intolerable, excessive dry mouth, he has had multiple dental problems, difficulty with his vision, mental clarity, he is eager to get off of it. We will try switching to Myrbetriq. If this works he can follow this up with his PCP. For insurance coverage purposes he has tried and failed oxybutynin, he will also have intolerable adverse effects with any anticholinergic drug such as Vesicare and Detrol.

## 2023-04-06 NOTE — Assessment & Plan Note (Signed)
This pleasant 59 An[place] male returns, we have been treating him for multifactorial low back pain, he has spondylolisthesis, lumbar spinal stenosis. He also had a positive HLA-B27, we treated him with Celebrex and physical therapy. He has improve dramatically, he has very little pain in his back, I think he should continue therapy be at formal or at home, and we can hold off on advanced imaging for epidural planning.

## 2023-04-07 NOTE — Addendum Note (Signed)
Addended by: Leone Brand on: 04/07/2023 08:18 AM   Modules accepted: Orders

## 2023-04-12 NOTE — Progress Notes (Unsigned)
MEDICARE ANNUAL WELLNESS VISIT AND FOLLOW UP Assessment:   Leroy Griffin was seen today for follow-up and medicare wellness.  Diagnoses and all orders for this visit:  Encounter for Medicare annual wellness exam Due Yearly Colonoscopy 02/06/2023 no further recommended  Essential hypertension -  continue medications: Imdur 30 mg every day, losartan 50 mg every day, Metoprolol 25 mg every day  - Continue DASH diet, exercise and monitor at home. Call if greater than 130/80.    Sick Sinus Syndrome(HCC) Continue medications: Imdur 30 mg every day, losartan 50 mg every day, Metoprolol 25 mg every day  Follows with cardiology  Paroxysmal Atrial Fibrillation(HCC) Continue medications and follow with cardiology  Indwelling pacemaker Continue to follow with cardiology  Hyperlipidemia, mixed Continue Atorvastatin 80 mg every day , diet and exercise -     COMPLETE METABOLIC PANEL WITH GFR -     Lipid panel  Abnormal glucose -     COMPLETE METABOLIC PANEL WITH GFR  Chronic Renal Failure Stage 2       - Push fluids, diet and exercise       - Glycemic and cholesterol control  CBC  CMP  Coronary artery disease involving native coronary artery of native heart, angina presence unspecified Control blood pressure, cholesterol, glucose, increase exercise.  Followed by cardiology Nitroglycerine if needed   S/P coronary artery stent placement x1 (2019) Continue BB, statin,  Followed by cardiology   S/P IVC filter Has follow up scheduled   DVT of axillary vein, acute bilateral (HCC) IVC filter in place.   Gastrointestinal hemorrhage associated with duodenal ulcer Avoid NSAID's Continue to monitor  Medication management -     CBC with Differential/Platelet -     COMPLETE METABOLIC PANEL WITH GFR -     Lipid panel -     TSH  Essential tremor       - Continue Metoprolol  and valium and monitor symptoms   Ankylosing spondylitis of multiple sites in spine (HCC)Spondylolisthesis of lumbar  region Continue to follow with physical medicine Do home PT exercises  Flu vaccine need -     Flu vaccine HIGH DOSE PF       Continue diet and meds as discussed. Further disposition pending results of labs. Over 40 minutes of face to face interview, exam, counseling, chart review, and critical decision making was performed.  Future Appointments  Date Time Provider Department Center  04/26/2023  2:00 PM Leone Brand, PT [facility]  06/06/2023  9:20 AM Dimple Casey, Jamesetta Orleans, MD [facility] None  09/12/2023  3:00 PM Lucky Cowboy, MD [facility] None  04/16/2024  3:00 PM Raynelle Dick, NP [facility] None     Plan:   During the course of the visit the patient was educated and counseled about appropriate screening and preventive services including:   Pneumococcal vaccine  Hepatitis B vaccine Td vaccine Screening electrocardiogram Colorectal cancer screening Diabetes screening Glaucoma screening Nutrition counseling    Subjective:  Leroy Griffin is a 77 y.o. male who presents accompanied for Medicare Annual Wellness Visit and 6 month follow up for HTN, HLD, prediabetes, and vitamin D Def.   Patient had negative screening stress test and Cardiac MRI in Feb 2017 per Dr Graciela Husbands, however demonstrated unstable angina in 11/2017 and underwent diagnostic heart cath by Dr. Satira Sark. Cair which demonstrated high-grade stenosis of distal LAD (85-90%) and diagonal (90%) - stent was placed to LAD with balloon inflation of diagonal with improvement to 50%. He does have a cardiac  pacemaker. He is following with Dr. Chales Abrahams currently. He has not had to use his nitroglycerin.  Will occasionally get shortness of breath with exertion  Has urinary frequency, nocturia x 0-5, very rare incontinence if he waits too long. Occ hesitancy, dribbling, weak stream. - has seen Dr. Annabell Howells in recent weeks, reports normal prostate, has small bladder and OAB. He is currently on Myrbetriq 50 mg every day, restarted 3  days ago.  Using Colace and Miralax for constipation  Tremor is very controlled with use of Valium and Metorpolol. Last refill of Valium 5 mg was 11/23/22 #90.  He has been undergoing PT for movement disorder and Spondylolisthesis of lumbar region- symptoms have improved dramatically per Dr. Benjamin Stain. Does have pain associated with ankylosing spondylitis for which he takes Tramadol PRN. Continues to PT.  He did lift a part of the tractor this AM and felt some pulling in the back of his legs.   He has rheumatology appointment in October to reevaluate for Parkinson's at suggestion of physical medicine doctor.  He had 3 teeth extracted and 2 implants were placed 2 weeks ago  and a third is still to be implanted.  He has some swelling noted at site and has appointment tomorrow to be reevaluated. He does have bruising noted of face.   BMI is Body mass index is 26.91 kg/m., he has been working on diet and exercise. Wife is strictly watching his diet for heart healthy diet and portion control.  Wt Readings from Last 3 Encounters:  04/13/23 202 lb 9.6 oz (91.9 kg)  12/16/22 191 lb (86.6 kg)  09/06/22 202 lb 9.6 oz (91.9 kg)   Meds currently include: Imdur 30 mg every day, losartan 50 mg every day, Metoprolol 25 mg every day , today their BP is BP: 124/78  BP Readings from Last 3 Encounters:  04/13/23 124/78  12/16/22 111/73  09/06/22 134/82  He denies chest pain, shortness of breath, dizziness.   He is on cholesterol medication (atorvastatin 80 mg daily) and denies myalgias. His cholesterol is at goal. The cholesterol last visit was:   Lab Results  Component Value Date   CHOL 114 09/06/2022   HDL 42 09/06/2022   LDLCALC 59 09/06/2022   TRIG 58 09/06/2022   CHOLHDL 2.7 09/06/2022   He has been working on diet and exercise for glucose management, and denies paresthesia of the feet, polydipsia and polyuria. Last A1C in the office was:  Lab Results  Component Value Date   HGBA1C 5.5  09/06/2022   Patient also has Idiopathic proteinuria & CKD2 followed by Dr Julien Girt.  Lab Results  Component Value Date   EGFR 61 09/06/2022    Patient is on Vitamin D supplement.   Lab Results  Component Value Date   VD25OH 69 09/06/2022       Medication Review: Current Outpatient Medications on File Prior to Visit  Medication Sig Dispense Refill   acetaminophen (TYLENOL) 500 MG tablet Take 500 mg by mouth every 6 (six) hours as needed.     aspirin EC 81 MG tablet Take 1 tablet Daily     atorvastatin (LIPITOR) 80 MG tablet TAKE 1 TABLET BY MOUTH EVERY DAY 90 tablet 1   celecoxib (CELEBREX) 100 MG capsule One to 2 tablets by mouth daily as needed for pain. 60 capsule 3   Cholecalciferol (VITAMIN D) 2000 units tablet Take 8,000 Units by mouth every morning.      diazepam (VALIUM) 5 MG tablet TAKE 1  TABLET 3 X /DAY FOR HEREDITARY TREMOR 270 tablet 1   isosorbide mononitrate (IMDUR) 30 MG 24 hr tablet TAKE 1 TABLET(30 MG) BY MOUTH DAILY     losartan (COZAAR) 50 MG tablet Take 50 mg by mouth.     MAGNESIUM PO Take by mouth. Takes magnesium 250 mg gummy twice a day.     metoprolol succinate (TOPROL-XL) 25 MG 24 hr tablet Take 1 tablet by mouth daily.     mirabegron ER (MYRBETRIQ) 50 MG TB24 tablet Take 1 tablet (50 mg total) by mouth daily. 30 tablet 11   nitroGLYCERIN (NITROSTAT) 0.4 MG SL tablet Place 1 tablet under the tongue every 5 (five) minutes as needed. For chest pain     Omega-3 Fatty Acids (FISH OIL) 1200 MG CPDR Take 2 capsules by mouth at bedtime.      pantoprazole (PROTONIX) 40 MG tablet Take 40 mg by mouth daily.     traMADol (ULTRAM) 50 MG tablet Take  1/2 to 1 tablet  every 4 hours  Only  if Needed  for Severe Pain 15 tablet 0   triamcinolone ointment (KENALOG) 0.1 % Apply 1 application topically 3 (three) times daily. 80 g 3   vitamin B-12 (CYANOCOBALAMIN) 1000 MCG tablet Take 1,000 mcg by mouth daily.     Minoxidil 5 % FOAM Apply 1 application  topically  daily. (Patient not taking: Reported on 04/13/2023)     predniSONE (DELTASONE) 20 MG tablet Take one tab by mouth twice daily for 4 days, then one daily for 3 days. Take with food. (Patient not taking: Reported on 04/13/2023) 11 tablet 0   No current facility-administered medications on file prior to visit.    Current Problems (verified) Patient Active Problem List   Diagnosis Date Noted   Ankylosing spondylitis (HCC) 01/19/2023   Spondylolisthesis of lumbar region 01/17/2023   S/P placement of cardiac pacemaker 11/18/2020   Other abnormalities of gait and mobility 01/17/2019   Depression due to physical illness 01/17/2019   Overactive bladder 01/11/2018   S/P drug eluting coronary stent placement 11/21/2017   CAD in native artery 11/20/2017   Anxiety 11/14/2017   Overweight (BMI 25.0-29.9) 08/26/2014   Sick sinus syndrome (HCC) 10/10/2013   Medication management 10/06/2013   Hyperlipidemia 08/27/2013   Movement disorder    Proteinuria    Other abnormal glucose    Gout    Vitamin D deficiency    Essential hypertension 10/10/2010    Screening Tests Immunization History  Administered Date(s) Administered   Influenza Split 05/23/2013   Influenza, High Dose Seasonal PF 05/14/2014, 06/11/2015, 05/19/2016, 06/10/2017, 05/20/2019   Influenza-Unspecified 04/22/2020   PFIZER(Purple Top)SARS-COV-2 Vaccination 10/11/2019, 11/01/2019, 06/10/2020   Pneumococcal Conjugate-13 12/03/2014   Pneumococcal Polysaccharide-23 06/01/2010   Td 06/01/2010   Tdap 01/28/2014   Zoster, Live 08/08/2004   Health Maintenance  Topic Date Due   INFLUENZA VACCINE  03/09/2023   COVID-19 Vaccine (4 - 2023-24 season) 04/29/2023 (Originally 04/09/2023)   Zoster Vaccines- Shingrix (1 of 2) 07/13/2023 (Originally 03/10/1965)   Hepatitis C Screening  04/12/2024 (Originally 03/09/1964)   DTaP/Tdap/Td (3 - Td or Tdap) 01/29/2024   Medicare Annual Wellness (AWV)  04/12/2024   Pneumonia Vaccine 64+ Years old  Completed    HPV VACCINES  Aged Out   Colonoscopy  Discontinued     Names of Other Physician/Practitioners you currently use: 1. [facility] here for primary care 2. Dr. Porfirio Mylar, 11/2022 Nonexudative age-related macular degeneration, bilateral, intermediate dry stage  3. Dr. Dierdre Searles 03/2023 q 6 months   Patient Care Team: Lucky Cowboy, MD as PCP - General (Internal Medicine) Sharyn Lull, Elvin So, MD as Referring Physician (Dermatology) Mittie Bodo, MD as Referring Physician (Cardiology) Annie Sable, MD as Consulting Physician (Nephrology) Marney Setting, MD as Referring Physician (Gastroenterology)   Allergies Allergies  Allergen Reactions   Enalapril Rash   Nsaids     Internal bleeding   Welchol [Colesevelam Hcl] Diarrhea   Vasotec [Enalaprilat] Rash    SURGICAL HISTORY He  has a past surgical history that includes Knee surgery (Right); Tonsillectomy; Cyst excision; Cholecystectomy; and PACEMAKER IMPLANT. FAMILY HISTORY His family history includes Dementia in his mother; Emphysema in his brother; Heart disease in his brother; Stroke in his father. SOCIAL HISTORY He  reports that he has never smoked. He has never used smokeless tobacco. He reports that he does not currently use alcohol after a past usage of about 14.0 standard drinks of alcohol per week. He reports that he does not use drugs.  MEDICARE WELLNESS OBJECTIVES: Physical activity:  Walking around house and yard Cardiac risk factors: Cardiac Risk Factors include: advanced age (>39men, >82 women);dyslipidemia;hypertension;male gender Depression/mood screen:      04/13/2023    3:32 PM  Depression screen PHQ 2/9  Decreased Interest 0  Down, Depressed, Hopeless 0  PHQ - 2 Score 0    ADLs:     04/13/2023    3:30 PM  In your present state of health, do you have any difficulty performing the following activities:  Hearing? 0  Vision? 0  Difficulty concentrating or making  decisions? 0  Walking or climbing stairs? 1  Dressing or bathing? 0  Doing errands, shopping? 0     Cognitive Testing  Alert? Yes  Normal Appearance?Yes  Oriented to person? Yes  Place? Yes   Time? Yes  Recall of three objects?  Yes  Can perform simple calculations? Yes  Displays appropriate judgment?Yes  Can read the correct time from a watch face?Yes  EOL planning: Does Patient Have a Medical Advance Directive?: Yes Type of Advance Directive: Living will, Healthcare Power of Attorney Does patient want to make changes to medical advance directive?: No - Patient declined Copy of Healthcare Power of Attorney in Chart?: No - copy requested    Objective:   Blood pressure 124/78, pulse 84, temperature 97.9 F (36.6 C), height 6' 0.75" (1.848 m), weight 202 lb 9.6 oz (91.9 kg), SpO2 94%. Body mass index is 26.91 kg/m.  General appearance: alert, no distress, WD/WN, male, patient wearing mask. HEENT: normocephalic, sclerae anicteric, TMs pearly, nares patent, no discharge or erythema, pharynx normal Oral cavity: MMM, sutures intact to upper left gums from extractions, no signs of infection Neck: supple, no lymphadenopathy, no thyromegaly, no masses Heart: RRR, normal S1, S2, no murmurs Lungs: CTA bilaterally, no wheezes, rhonchi, or rales Abdomen: +bs, soft, non tender, non distended, no masses, no hepatomegaly, no splenomegaly Musculoskeletal: nontender, no swelling, no obvious deformity Extremities: no edema, no cyanosis, no clubbing Skin: warm and dry, large ecchymosis left jaw from tooth removal Pulses: 2+ symmetric, upper and lower extremities, normal cap refill Neurological: alert, oriented x 3, CN2-12 intact, strength normal upper extremities and lower extremities, , mild tremor of the right hand present, sensation normal throughout, DTRs 2+ throughout, no cerebellar signs, shuffling bent over gait with cane Psychiatric: normal affect, behavior normal, pleasant   Medicare  Attestation I have personally reviewed: The patient's medical and social history Their use of alcohol,  tobacco or illicit drugs Their current medications and supplements The patient's functional ability including ADLs,fall risks, home safety risks, cognitive, and hearing and visual impairment Diet and physical activities Evidence for depression or mood disorders  The patient's weight, height, BMI, and visual acuity have been recorded in the chart.  I have made referrals, counseling, and provided education to the patient based on review of the above and I have provided the patient with a written personalized care plan for preventive services.    Revonda Humphrey ANP-C  [facility]  04/13/2023

## 2023-04-13 ENCOUNTER — Encounter: Payer: Self-pay | Admitting: Nurse Practitioner

## 2023-04-13 ENCOUNTER — Ambulatory Visit (INDEPENDENT_AMBULATORY_CARE_PROVIDER_SITE_OTHER): Payer: Medicare Other | Admitting: Nurse Practitioner

## 2023-04-13 VITALS — BP 124/78 | HR 84 | Temp 97.9°F | Ht 72.75 in | Wt 202.6 lb

## 2023-04-13 DIAGNOSIS — E559 Vitamin D deficiency, unspecified: Secondary | ICD-10-CM

## 2023-04-13 DIAGNOSIS — Z0000 Encounter for general adult medical examination without abnormal findings: Secondary | ICD-10-CM

## 2023-04-13 DIAGNOSIS — E782 Mixed hyperlipidemia: Secondary | ICD-10-CM

## 2023-04-13 DIAGNOSIS — I82A13 Acute embolism and thrombosis of axillary vein, bilateral: Secondary | ICD-10-CM

## 2023-04-13 DIAGNOSIS — I2510 Atherosclerotic heart disease of native coronary artery without angina pectoris: Secondary | ICD-10-CM

## 2023-04-13 DIAGNOSIS — Z955 Presence of coronary angioplasty implant and graft: Secondary | ICD-10-CM | POA: Diagnosis not present

## 2023-04-13 DIAGNOSIS — Z0001 Encounter for general adult medical examination with abnormal findings: Secondary | ICD-10-CM | POA: Diagnosis not present

## 2023-04-13 DIAGNOSIS — R7309 Other abnormal glucose: Secondary | ICD-10-CM | POA: Diagnosis not present

## 2023-04-13 DIAGNOSIS — R6889 Other general symptoms and signs: Secondary | ICD-10-CM

## 2023-04-13 DIAGNOSIS — Z950 Presence of cardiac pacemaker: Secondary | ICD-10-CM

## 2023-04-13 DIAGNOSIS — I495 Sick sinus syndrome: Secondary | ICD-10-CM

## 2023-04-13 DIAGNOSIS — Z79899 Other long term (current) drug therapy: Secondary | ICD-10-CM

## 2023-04-13 DIAGNOSIS — M450 Ankylosing spondylitis of multiple sites in spine: Secondary | ICD-10-CM

## 2023-04-13 DIAGNOSIS — K264 Chronic or unspecified duodenal ulcer with hemorrhage: Secondary | ICD-10-CM

## 2023-04-13 DIAGNOSIS — Z23 Encounter for immunization: Secondary | ICD-10-CM

## 2023-04-13 DIAGNOSIS — I10 Essential (primary) hypertension: Secondary | ICD-10-CM | POA: Diagnosis not present

## 2023-04-13 DIAGNOSIS — G250 Essential tremor: Secondary | ICD-10-CM

## 2023-04-13 DIAGNOSIS — I480 Paroxysmal atrial fibrillation: Secondary | ICD-10-CM

## 2023-04-13 DIAGNOSIS — M4316 Spondylolisthesis, lumbar region: Secondary | ICD-10-CM

## 2023-04-13 DIAGNOSIS — N182 Chronic kidney disease, stage 2 (mild): Secondary | ICD-10-CM | POA: Diagnosis not present

## 2023-04-13 NOTE — Patient Instructions (Signed)

## 2023-04-14 LAB — CBC WITH DIFFERENTIAL/PLATELET
Absolute Monocytes: 374 {cells}/uL (ref 200–950)
Basophils Absolute: 42 {cells}/uL (ref 0–200)
Basophils Relative: 0.8 %
Eosinophils Absolute: 151 {cells}/uL (ref 15–500)
Eosinophils Relative: 2.9 %
HCT: 40.8 % (ref 38.5–50.0)
Hemoglobin: 13.7 g/dL (ref 13.2–17.1)
Lymphs Abs: 1508 {cells}/uL (ref 850–3900)
MCH: 33.4 pg — ABNORMAL HIGH (ref 27.0–33.0)
MCHC: 33.6 g/dL (ref 32.0–36.0)
MCV: 99.5 fL (ref 80.0–100.0)
MPV: 10.2 fL (ref 7.5–12.5)
Monocytes Relative: 7.2 %
Neutro Abs: 3125 {cells}/uL (ref 1500–7800)
Neutrophils Relative %: 60.1 %
Platelets: 164 10*3/uL (ref 140–400)
RBC: 4.1 10*6/uL — ABNORMAL LOW (ref 4.20–5.80)
RDW: 12.3 % (ref 11.0–15.0)
Total Lymphocyte: 29 %
WBC: 5.2 10*3/uL (ref 3.8–10.8)

## 2023-04-14 LAB — COMPLETE METABOLIC PANEL WITH GFR
AG Ratio: 1.5 (calc) (ref 1.0–2.5)
ALT: 19 U/L (ref 9–46)
AST: 28 U/L (ref 10–35)
Albumin: 3.8 g/dL (ref 3.6–5.1)
Alkaline phosphatase (APISO): 58 U/L (ref 35–144)
BUN: 22 mg/dL (ref 7–25)
CO2: 30 mmol/L (ref 20–32)
Calcium: 9.9 mg/dL (ref 8.6–10.3)
Chloride: 106 mmol/L (ref 98–110)
Creat: 1.23 mg/dL (ref 0.70–1.28)
Globulin: 2.5 g/dL (ref 1.9–3.7)
Glucose, Bld: 101 mg/dL — ABNORMAL HIGH (ref 65–99)
Potassium: 5.1 mmol/L (ref 3.5–5.3)
Sodium: 143 mmol/L (ref 135–146)
Total Bilirubin: 0.6 mg/dL (ref 0.2–1.2)
Total Protein: 6.3 g/dL (ref 6.1–8.1)
eGFR: 60 mL/min/{1.73_m2} (ref >=60)

## 2023-04-14 LAB — LIPID PANEL
Cholesterol: 141 mg/dL (ref <200)
HDL: 37 mg/dL — ABNORMAL LOW (ref >=40)
LDL Cholesterol (Calc): 77 mg/dL
Non-HDL Cholesterol (Calc): 104 mg/dL (ref <130)
Total CHOL/HDL Ratio: 3.8 (calc) (ref <5.0)
Triglycerides: 169 mg/dL — ABNORMAL HIGH (ref <150)

## 2023-04-14 LAB — TSH: TSH: 1.65 m[IU]/L (ref 0.40–4.50)

## 2023-04-19 ENCOUNTER — Other Ambulatory Visit: Payer: Self-pay | Admitting: Internal Medicine

## 2023-04-19 ENCOUNTER — Other Ambulatory Visit: Payer: Self-pay | Admitting: Nurse Practitioner

## 2023-04-19 DIAGNOSIS — I2510 Atherosclerotic heart disease of native coronary artery without angina pectoris: Secondary | ICD-10-CM

## 2023-04-19 DIAGNOSIS — E7800 Pure hypercholesterolemia, unspecified: Secondary | ICD-10-CM

## 2023-04-19 MED ORDER — ATORVASTATIN CALCIUM 80 MG PO TABS
ORAL_TABLET | ORAL | 3 refills | Status: AC
Start: 2023-04-19 — End: ?

## 2023-04-20 ENCOUNTER — Other Ambulatory Visit: Payer: Self-pay | Admitting: Internal Medicine

## 2023-04-26 ENCOUNTER — Ambulatory Visit: Payer: Medicare Other | Admitting: Physical Therapy

## 2023-05-02 ENCOUNTER — Other Ambulatory Visit: Payer: Self-pay | Admitting: Internal Medicine

## 2023-05-02 DIAGNOSIS — M450 Ankylosing spondylitis of multiple sites in spine: Secondary | ICD-10-CM

## 2023-05-03 ENCOUNTER — Encounter: Payer: Self-pay | Admitting: Physical Therapy

## 2023-05-03 ENCOUNTER — Other Ambulatory Visit: Payer: Self-pay | Admitting: Internal Medicine

## 2023-05-03 ENCOUNTER — Ambulatory Visit: Payer: Medicare Other | Attending: Sports Medicine | Admitting: Physical Therapy

## 2023-05-03 DIAGNOSIS — M6281 Muscle weakness (generalized): Secondary | ICD-10-CM | POA: Diagnosis present

## 2023-05-03 DIAGNOSIS — M5459 Other low back pain: Secondary | ICD-10-CM | POA: Diagnosis not present

## 2023-05-03 DIAGNOSIS — M450 Ankylosing spondylitis of multiple sites in spine: Secondary | ICD-10-CM

## 2023-05-03 NOTE — Therapy (Signed)
OUTPATIENT PHYSICAL THERAPY THORACOLUMBAR TREATMENT    Patient Name: Leroy Griffin MRN: [id number] DOB:January 30, 1946, 77 y.o., male Today's Date: 05/03/2023 Dates of service 02/07/23-04/06/23 END OF SESSION:  PT End of Session - 05/03/23 1227     Visit Number 11    Number of Visits 26    Date for PT Re-Evaluation 06/01/23    Authorization Type Medicare    Authorization - Visit Number 11    Progress Note Due on Visit 20    PT Start Time 1145    PT Stop Time 1228    PT Time Calculation (min) 43 min    Activity Tolerance Patient tolerated treatment well    Behavior During Therapy [facility] for tasks assessed/performed                  Past Medical History:  Diagnosis Date   CAD (coronary artery disease)    Essential tremor    Gastric ulcer 01/14/2015   Gout    Hyperlipidemia    Hypertension    Parkinson's disease    Prediabetes    Proteinuria    Vitamin D deficiency    Past Surgical History:  Procedure Laterality Date   CHOLECYSTECTOMY     CYST EXCISION     back   KNEE SURGERY Right    PACEMAKER IMPLANT     TONSILLECTOMY     Patient Active Problem List   Diagnosis Date Noted   Ankylosing spondylitis (HCC) 01/19/2023   Spondylolisthesis of lumbar region 01/17/2023   S/P placement of cardiac pacemaker 11/18/2020   Other abnormalities of gait and mobility 01/17/2019   Depression due to physical illness 01/17/2019   Overactive bladder 01/11/2018   S/P drug eluting coronary stent placement 11/21/2017   CAD in native artery 11/20/2017   Anxiety 11/14/2017   Overweight (BMI 25.0-29.9) 08/26/2014   Sick sinus syndrome (HCC) 10/10/2013   Medication management 10/06/2013   Hyperlipidemia 08/27/2013   Movement disorder    Proteinuria    Other abnormal glucose    Gout    Vitamin D deficiency    Essential hypertension 10/10/2010    PCP: Oneta Rack  REFERRING PROVIDER: Benjamin Stain  REFERRING DIAG: spondylolisthesis of lumbar region  Rationale for Evaluation and  Treatment: Rehabilitation  THERAPY DIAG:  Other low back pain  Muscle weakness (generalized)  ONSET DATE: 12/2022  SUBJECTIVE:                                                                                                                                                                                           SUBJECTIVE STATEMENT: Pt returns after 1 month away due to dental  surgery. Pt reports his back is feeling better, he is having some knee pain that comes and goes, especially with stair negotiation. Pt saw sports MD who was pleased with strength improvements, pt is following up with neurology about possible parkinson's diagnosis  PERTINENT HISTORY:  Ankylosing spondylitis Bilateral knee pain  PAIN:  Are you having pain? Yes: NPRS scale: 0/10 at rest, up to /10 Pain location: low back Pain description: high discomfort Aggravating factors: lift, stand, prolonged walking Relieving factors: rest, meds  PRECAUTIONS: ICD/Pacemaker  WEIGHT BEARING RESTRICTIONS: No  FALLS:  Has patient fallen in last 6 months? 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   Mild to moderate multilevel degenerative disc disease above this level.   Moderate lower lumbar predominant facet arthropathy.   SENSATION: WFL  MUSCLE LENGTH: Hamstrings: Right 90 deg; Left 90 deg Thomas test: (+) bilat  POSTURE: decreased lumbar lordosis and flexed trunk   PALPATION: TTP  with gentle pressure L3 spinous process Increased mm spasticity bilat lumbar paraspinals  LUMBAR ROM:   AROM eval  Flexion WFL  Extension 75%  Right lateral flexion 50%  Left lateral flexion 50%  Right rotation 75%  Left rotation 75%   (Blank rows = not tested)  LOWER EXTREMITY MMT:    MMT Right eval Left eval Right 03/23/23 Left  03/23/23 R/L 04/06/23  Hip flexion 4- 4 4 4  4/4+  Hip extension 3 3 4 4  4+/4  Hip abduction 4- 4- 4+ 4+ 4+/4+  Hip adduction       Hip internal rotation       Hip external rotation       Knee flexion       Knee extension       Ankle dorsiflexion       Ankle plantarflexion       Ankle inversion       Ankle eversion        (Blank rows = not tested)  LUMBAR SPECIAL TESTS:  Straight leg raise test: Negative and FABER test: Negative  GAIT: Distance walked: 100' Assistive device utilized: Single point cane Level of assistance: Modified independence Comments: decreased cadence, small step and stride length  TODAY'S TREATMENT:  [facility] Adult PT Treatment:                                                DATE: 05/03/23 Therapeutic Exercise: Step ups 6'' step 2 x 10 Lateral step downs 6'' step with UE support x 10 bilat Heel/toe raises standing 2 x 10 Sit <> stand without UE support 2 x 5  Neuromuscular re-ed: Tandem stance 2 x 30 sec bilat Narrow BOS with head turns, nods Wide BOS with trunk rotation  Gait: Gait around clinic with SPC with supervision, decreased cadence Backwards gait 10' x 4 CGA   [facility] Adult PT Treatment:  DATE: 04/06/2023 Therapeutic Exercise: Hip MMT Hip flexor stretch: kneeling (no stretch) --> seated edge of table  Therapeutic Activity: Walking with 10#KB suitcase carry (B)  Discussion if incorporating walking program outdoors with walking sticks to promote reciprocal gait pattern and upright standing posture Self Care: Walking program with walking sticks   [facility] Adult  PT Treatment:                                                DATE: 03/30/2023 Therapeutic Exercise: Standing lumbar extension stretch at high table Standing deadlift with orange superband x10 Prone press up on elbows  Squats with seat tap on table -->gradually lowering Therapeutic Activity: Standing in corner, chair in front (SBA) --> standing on airex, slow head turns/nods Tandem walking with high table for support Tandem stance balance Weight shifting on R LE --> R SLB + glute activation     PATIENT EDUCATION:  Education details: Updated HEP Person educated: Patient and Spouse Education method: Explanation, Demonstration, and Handouts Education comprehension: verbalized understanding  HOME EXERCISE PROGRAM: Access Code: [email or web address] URL: [email or web address] Date: 03/30/2023 Prepared by: Carlynn Herald  Exercises - Seated Hip Flexor Stretch  - 1 x daily - 7 x weekly - 1 sets - 3 reps - 20 seconds hold - Hip Abduction with Resistance Loop  - 1 x daily - 7 x weekly - 3 sets - 10 reps - Hip Extension with Resistance Loop  - 1 x daily - 7 x weekly - 3 sets - 10 reps - Standing Lumbar Extension with Counter  - 1 x daily - 7 x weekly - 3 sets - 10 reps - Sit to Stand Without Arm Support  - 1 x daily - 7 x weekly - 2 sets - 10 reps - Semi-Tandem Balance at [employer] Eyes Open  - 1 x daily - 7 x weekly - 3 sets - 10 reps - Semi-Tandem Balance at [employer] Eyes Closed  - 1 x daily - 7 x weekly - 3 sets - 10 reps - Prone on Elbows Stretch  - 1 x daily - 7 x weekly - 3 sets - 10 reps - Supine Bridge  - 1 x daily - 7 x weekly - 3 sets - 10 reps - Walking with Head Rotation  - 1 x daily - 7 x weekly - 3 sets - 10 reps - Side Stepping with Counter Support  - 1 x daily - 7 x weekly - 3 sets - 10 reps - Prone Press Up On Elbows  - 1 x daily - 7 x weekly - 1 sets - 10 reps - 20-30 sec hold - Single Leg Stance with Support  - 1 x daily - 7 x weekly - 1 sets - 5 reps - 20-30  hold  ASSESSMENT:  CLINICAL IMPRESSION:  Session focused on gait/balance and LE strengthening to reduce risk of falls and decrease knee pain. Pt with good tolerance to all activities throughout session   GOALS: Goals reviewed with patient? Yes  SHORT TERM GOALS: Target date: 7/30/202  Pt will be independent with initial HEP Baseline: Goal status: MET  LONG TERM GOALS: Target date: 06/01/2023  Pt will be independent with advanced HEP Baseline:  Goal status: MET  2.  Pt will improve bilat LE strength to 4+/5 to improve standing and walking tolerance Baseline: see above Goal  status: IN PROGRESS  3.  Pt will tolerate lifting 15# floor to chest with pain <= 1/10 Baseline:  Goal status: MET  4.  Pt will tolerate standing x 20 minutes to perform leisure activities with pain <= 1/10 Baseline: 5/10 pain Goal status: IN PROGRESS  5. Pt will demo improved balance by being able to perform gait without AD x 150' with independence  Goal status: INITIAL  6. Pt will demo improved balance by perform tandem stance without UE support x 30 seconds bilat  Goal status: INITIAL   PLAN:  PT FREQUENCY: 2x/week  PT DURATION: 8 weeks  PLANNED INTERVENTIONS: Therapeutic exercises, Therapeutic activity, Neuromuscular re-education, Balance training, Gait training, Patient/Family education, Self Care, Joint mobilization, Aquatic Therapy, Dry Needling, Cryotherapy, Moist heat, Taping, Ionotophoresis 4mg /ml Dexamethasone, Manual therapy, and Re-evaluation.  PLAN FOR NEXT SESSION: updated/condense HEP, balance, strength  Socorro Ebron, PT 05/03/2023, 12:28 PM

## 2023-05-08 ENCOUNTER — Telehealth: Payer: Self-pay | Admitting: Nurse Practitioner

## 2023-05-08 NOTE — Telephone Encounter (Signed)
Wife is requesting refill on Tramadol. Pls send to [facility] [address], [place].

## 2023-05-08 NOTE — Telephone Encounter (Signed)
I do not fill his Tramadol.  Need to send to Dr. Oneta Rack

## 2023-05-09 ENCOUNTER — Other Ambulatory Visit: Payer: Self-pay | Admitting: Nurse Practitioner

## 2023-05-09 DIAGNOSIS — M450 Ankylosing spondylitis of multiple sites in spine: Secondary | ICD-10-CM

## 2023-05-10 ENCOUNTER — Ambulatory Visit: Payer: Medicare Other | Attending: Sports Medicine | Admitting: Physical Therapy

## 2023-05-10 ENCOUNTER — Encounter: Payer: Self-pay | Admitting: Physical Therapy

## 2023-05-10 DIAGNOSIS — M6281 Muscle weakness (generalized): Secondary | ICD-10-CM | POA: Insufficient documentation

## 2023-05-10 DIAGNOSIS — M5459 Other low back pain: Secondary | ICD-10-CM | POA: Insufficient documentation

## 2023-05-10 NOTE — Therapy (Signed)
OUTPATIENT PHYSICAL THERAPY THORACOLUMBAR TREATMENT    Patient Name: Leroy Griffin MRN: [id number] DOB:July 26, 1946, 77 y.o., male Today's Date: 05/10/2023 Dates of service 02/07/23-04/06/23 END OF SESSION:  PT End of Session - 05/10/23 1224     Visit Number 12    Number of Visits 26    Date for PT Re-Evaluation 06/01/23    Authorization Type Medicare    Authorization - Visit Number 12    Progress Note Due on Visit 20    PT Start Time 1145    PT Stop Time 1228    PT Time Calculation (min) 43 min    Activity Tolerance Patient tolerated treatment well    Behavior During Therapy [facility] for tasks assessed/performed                   Past Medical History:  Diagnosis Date   CAD (coronary artery disease)    Essential tremor    Gastric ulcer 01/14/2015   Gout    Hyperlipidemia    Hypertension    Parkinson's disease (HCC)    Prediabetes    Proteinuria    Vitamin D deficiency    Past Surgical History:  Procedure Laterality Date   CHOLECYSTECTOMY     CYST EXCISION     back   KNEE SURGERY Right    PACEMAKER IMPLANT     TONSILLECTOMY     Patient Active Problem List   Diagnosis Date Noted   Ankylosing spondylitis (HCC) 01/19/2023   Spondylolisthesis of lumbar region 01/17/2023   S/P placement of cardiac pacemaker 11/18/2020   Other abnormalities of gait and mobility 01/17/2019   Depression due to physical illness 01/17/2019   Overactive bladder 01/11/2018   S/P drug eluting coronary stent placement 11/21/2017   CAD in native artery 11/20/2017   Anxiety 11/14/2017   Overweight (BMI 25.0-29.9) 08/26/2014   Sick sinus syndrome (HCC) 10/10/2013   Medication management 10/06/2013   Hyperlipidemia 08/27/2013   Movement disorder    Proteinuria    Other abnormal glucose    Gout    Vitamin D deficiency    Essential hypertension 10/10/2010    PCP: Oneta Rack  REFERRING PROVIDER: Benjamin Stain  REFERRING DIAG: spondylolisthesis of lumbar region  Rationale for Evaluation  and Treatment: Rehabilitation  THERAPY DIAG:  Other low back pain  Muscle weakness (generalized)  ONSET DATE: 12/2022  SUBJECTIVE:                                                                                                                                                                                           SUBJECTIVE STATEMENT: Pt with no new complaints.  PERTINENT HISTORY:  Ankylosing spondylitis Bilateral knee pain  PAIN:  Are you having pain? Yes: NPRS scale: 0/10 at rest, up to /10 Pain location: low back Pain description: high discomfort Aggravating factors: lift, stand, prolonged walking Relieving factors: rest, meds  PRECAUTIONS: ICD/Pacemaker  WEIGHT BEARING RESTRICTIONS: No  FALLS:  Has patient fallen in last 6 months? 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   Mild to moderate multilevel degenerative disc disease above this level.   Moderate lower lumbar predominant facet arthropathy.   SENSATION: WFL  MUSCLE LENGTH: Hamstrings: Right 90 deg; Left 90 deg Thomas test: (+) bilat  POSTURE: decreased lumbar lordosis and flexed trunk   PALPATION: TTP with gentle pressure L3 spinous process Increased mm spasticity bilat lumbar paraspinals  LUMBAR ROM:   AROM eval  Flexion WFL  Extension 75%  Right lateral flexion 50%  Left lateral flexion 50%  Right rotation 75%  Left rotation 75%   (Blank rows = not  tested)  LOWER EXTREMITY MMT:    MMT Right eval Left eval Right 03/23/23 Left  03/23/23 R/L 04/06/23  Hip flexion 4- 4 4 4  4/4+  Hip extension 3 3 4 4  4+/4  Hip abduction 4- 4- 4+ 4+ 4+/4+  Hip adduction       Hip internal rotation       Hip external rotation       Knee flexion       Knee extension       Ankle dorsiflexion       Ankle plantarflexion       Ankle inversion       Ankle eversion        (Blank rows = not tested)  LUMBAR SPECIAL TESTS:  Straight leg raise test: Negative and FABER test: Negative  GAIT: Distance walked: 100' Assistive device utilized: Single point cane Level of assistance: Modified independence Comments: decreased cadence, small step and stride length  TODAY'S TREATMENT:  [facility] Adult PT Treatment:                                                DATE: 05/10/23 Therapeutic Exercise: Step ups 6'' step 2 x 10 Lateral step downs 6'' step with UE support x 10 bilat Heel/toe raises 2 x 10 Sit <> stand without UE support x 10  Gait: Gait forward/backward CGA without AD Gait with obstacle negotiation stepping around, over and onto obstacles with CGA without AD   [facility] Adult PT Treatment:                                                DATE: 05/03/23 Therapeutic Exercise: Step ups 6'' step 2 x 10 Lateral step downs 6'' step with UE support x 10 bilat  Heel/toe raises standing 2 x 10 Sit <> stand without UE support 2 x 5  Neuromuscular re-ed: Tandem stance 2 x 30 sec bilat Narrow BOS with head turns, nods Wide BOS with trunk rotation  Gait: Gait around clinic with SPC with supervision, decreased cadence Backwards gait 10' x 4 CGA   [facility] Adult PT Treatment:                                                DATE: 04/06/2023 Therapeutic Exercise: Hip MMT Hip flexor stretch: kneeling (no stretch) --> seated edge of table  Therapeutic Activity: Walking with 10#KB suitcase carry (B)  Discussion if incorporating walking program outdoors with walking  sticks to promote reciprocal gait pattern and upright standing posture Self Care: Walking program with walking sticks     PATIENT EDUCATION:  Education details: Updated HEP Person educated: Patient and Spouse Education method: Explanation, Demonstration, and Handouts Education comprehension: verbalized understanding  HOME EXERCISE PROGRAM: Access Code: [email or web address] URL: [email or web address] Date: 03/30/2023 Prepared by: Carlynn Herald  Exercises - Seated Hip Flexor Stretch  - 1 x daily - 7 x weekly - 1 sets - 3 reps - 20 seconds hold - Hip Abduction with Resistance Loop  - 1 x daily - 7 x weekly - 3 sets - 10 reps - Hip Extension with Resistance Loop  - 1 x daily - 7 x weekly - 3 sets - 10 reps - Standing Lumbar Extension with Counter  - 1 x daily - 7 x weekly - 3 sets - 10 reps - Sit to Stand Without Arm Support  - 1 x daily - 7 x weekly - 2 sets - 10 reps - Semi-Tandem Balance at [employer] Eyes Open  - 1 x daily - 7 x weekly - 3 sets - 10 reps - Semi-Tandem Balance at [employer] Eyes Closed  - 1 x daily - 7 x weekly - 3 sets - 10 reps - Prone on Elbows Stretch  - 1 x daily - 7 x weekly - 3 sets - 10 reps - Supine Bridge  - 1 x daily - 7 x weekly - 3 sets - 10 reps - Walking with Head Rotation  - 1 x daily - 7 x weekly - 3 sets - 10 reps - Side Stepping with Counter Support  - 1 x daily - 7 x weekly - 3 sets - 10 reps - Prone Press Up On Elbows  - 1 x daily - 7 x weekly - 1 sets - 10 reps - 20-30 sec hold - Single Leg Stance with Support  - 1 x daily - 7 x weekly - 1 sets - 5 reps - 20-30 hold  ASSESSMENT:  CLINICAL IMPRESSION:  Pt with difficulty with stepping over obstacles, requires HHA. Fatigues quickly with balance and gait activities and requires rest breaks. He improved performance with repetition   GOALS: Goals reviewed with patient? Yes  SHORT TERM GOALS: Target date: 7/30/202  Pt will be independent with initial HEP Baseline: Goal status:  MET  LONG TERM GOALS: Target date: 06/01/2023  Pt will be independent with advanced HEP Baseline:  Goal status: MET  2.  Pt will improve bilat LE strength to 4+/5 to improve standing and walking tolerance Baseline: see above Goal status: IN PROGRESS  3.  Pt will tolerate lifting 15# floor to chest with  pain <= 1/10 Baseline:  Goal status: MET  4.  Pt will tolerate standing x 20 minutes to perform leisure activities with pain <= 1/10 Baseline: 5/10 pain Goal status: IN PROGRESS  5. Pt will demo improved balance by being able to perform gait without AD x 150' with independence  Goal status: INITIAL  6. Pt will demo improved balance by perform tandem stance without UE support x 30 seconds bilat  Goal status: INITIAL   PLAN:  PT FREQUENCY: 2x/week  PT DURATION: 8 weeks  PLANNED INTERVENTIONS: Therapeutic exercises, Therapeutic activity, Neuromuscular re-education, Balance training, Gait training, Patient/Family education, Self Care, Joint mobilization, Aquatic Therapy, Dry Needling, Cryotherapy, Moist heat, Taping, Ionotophoresis 4mg /ml Dexamethasone, Manual therapy, and Re-evaluation.  PLAN FOR NEXT SESSION: updated/condense HEP, balance, strength  Toa Mia, PT 05/10/2023, 12:25 PM

## 2023-05-16 ENCOUNTER — Ambulatory Visit: Payer: Self-pay | Admitting: Physical Therapy

## 2023-05-17 ENCOUNTER — Encounter: Payer: Self-pay | Admitting: Physical Therapy

## 2023-05-17 ENCOUNTER — Ambulatory Visit: Payer: Medicare Other | Admitting: Physical Therapy

## 2023-05-17 DIAGNOSIS — M5459 Other low back pain: Secondary | ICD-10-CM

## 2023-05-17 DIAGNOSIS — M6281 Muscle weakness (generalized): Secondary | ICD-10-CM

## 2023-05-17 NOTE — Therapy (Addendum)
OUTPATIENT PHYSICAL THERAPY THORACOLUMBAR TREATMENT AND DISCHARGE   Patient Name: Leroy Griffin MRN: [id number] DOB:02-28-1946, 77 y.o., male Today's Date: 05/17/2023 Dates of service 02/07/23-04/06/23 END OF SESSION:  Leroy Griffin End of Session - 05/17/23 1229     Visit Number 13    Number of Visits 26    Date for Leroy Griffin Re-Evaluation 06/01/23    Authorization Type Medicare    Authorization - Visit Number 13    Progress Note Due on Visit 20    Leroy Griffin Start Time 1145    Leroy Griffin Stop Time 1230    Leroy Griffin Time Calculation (min) 45 min    Activity Tolerance Patient tolerated treatment well    Behavior During Therapy [facility] for tasks assessed/performed                    Past Medical History:  Diagnosis Date   CAD (coronary artery disease)    Essential tremor    Gastric ulcer 01/14/2015   Gout    Hyperlipidemia    Hypertension    Parkinson's disease (HCC)    Prediabetes    Proteinuria    Vitamin D deficiency    Past Surgical History:  Procedure Laterality Date   CHOLECYSTECTOMY     CYST EXCISION     back   KNEE SURGERY Right    PACEMAKER IMPLANT     TONSILLECTOMY     Patient Active Problem List   Diagnosis Date Noted   Ankylosing spondylitis (HCC) 01/19/2023   Spondylolisthesis of lumbar region 01/17/2023   S/P placement of cardiac pacemaker 11/18/2020   Other abnormalities of gait and mobility 01/17/2019   Depression due to physical illness 01/17/2019   Overactive bladder 01/11/2018   S/P drug eluting coronary stent placement 11/21/2017   CAD in native artery 11/20/2017   Anxiety 11/14/2017   Overweight (BMI 25.0-29.9) 08/26/2014   Sick sinus syndrome (HCC) 10/10/2013   Medication management 10/06/2013   Hyperlipidemia 08/27/2013   Movement disorder    Proteinuria    Other abnormal glucose    Gout    Vitamin D deficiency    Essential hypertension 10/10/2010    PCP: Oneta Rack  REFERRING PROVIDER: Benjamin Stain  REFERRING DIAG: spondylolisthesis of lumbar region  Rationale  for Evaluation and Treatment: Rehabilitation  THERAPY DIAG:  Other low back pain  Muscle weakness (generalized)  ONSET DATE: 12/2022  SUBJECTIVE:                                                                                                                                                                                           SUBJECTIVE STATEMENT: Leroy Griffin states his knees feel "  Stiff" but he has no pain  PERTINENT HISTORY:  Ankylosing spondylitis Bilateral knee pain  PAIN:  Are you having pain? Yes: NPRS scale: 0/10 at rest, up to /10 Pain location: low back Pain description: high discomfort Aggravating factors: lift, stand, prolonged walking Relieving factors: rest, meds  PRECAUTIONS: ICD/Pacemaker  WEIGHT BEARING RESTRICTIONS: No  FALLS:  Has patient fallen in last 6 months? 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   Mild to moderate multilevel degenerative disc disease above this level.   Moderate lower lumbar predominant facet arthropathy.   SENSATION: WFL  MUSCLE LENGTH: Hamstrings: Right 90 deg; Left 90 deg Thomas test: (+) bilat  POSTURE: decreased lumbar lordosis and flexed trunk   PALPATION: TTP with gentle pressure L3 spinous process Increased mm spasticity bilat lumbar paraspinals  LUMBAR ROM:   AROM eval  Flexion WFL  Extension 75%  Right lateral flexion 50%  Left lateral flexion 50%  Right rotation 75%  Left  rotation 75%   (Blank rows = not tested)  LOWER EXTREMITY MMT:    MMT Right eval Left eval Right 03/23/23 Left  03/23/23 R/L 04/06/23  Hip flexion 4- 4 4 4  4/4+  Hip extension 3 3 4 4  4+/4  Hip abduction 4- 4- 4+ 4+ 4+/4+  Hip adduction       Hip internal rotation       Hip external rotation       Knee flexion       Knee extension       Ankle dorsiflexion       Ankle plantarflexion       Ankle inversion       Ankle eversion        (Blank rows = not tested)  LUMBAR SPECIAL TESTS:  Straight leg raise test: Negative and FABER test: Negative  GAIT: Distance walked: 100' Assistive device utilized: Single point cane Level of assistance: Modified independence Comments: decreased cadence, small step and stride length  TODAY'S TREATMENT:  [facility] Adult Leroy Griffin Treatment:                                                DATE: 05/17/23 Therapeutic Exercise: Heel/toe raises when standing 2 x 10 Seated dead lift black power band 2 x 10 Seated shoulder flexion/trunk extension dowel + 5# 2 x 10  Neuromuscular re-ed: Tandem stance 2 x 30 sec bilat with intermittent UE support SLS x 10 bilat with UE support Standing on foam narrow BOS with head turns, nods with intermittent UE support  Gait: Suitcase carry 10# x 2 laps Gait over mat with squat and reach to stack cones, min A   [facility] Adult Leroy Griffin Treatment:  DATE: 05/10/23 Therapeutic Exercise: Step ups 6'' step 2 x 10 Lateral step downs 6'' step with UE support x 10 bilat Heel/toe raises 2 x 10 Sit <> stand without UE support x 10  Gait: Gait forward/backward CGA without AD Gait with obstacle negotiation stepping around, over and onto obstacles with CGA without AD   [facility] Adult Leroy Griffin Treatment:                                                DATE: 05/03/23 Therapeutic Exercise: Step ups 6'' step 2 x 10 Lateral step downs 6'' step with UE support x 10 bilat Heel/toe raises standing 2 x 10 Sit <>  stand without UE support 2 x 5  Neuromuscular re-ed: Tandem stance 2 x 30 sec bilat Narrow BOS with head turns, nods Wide BOS with trunk rotation  Gait: Gait around clinic with SPC with supervision, decreased cadence Backwards gait 10' x 4 CGA    PATIENT EDUCATION:  Education details: Updated HEP Person educated: Patient and Spouse Education method: Explanation, Demonstration, and Handouts Education comprehension: verbalized understanding  HOME EXERCISE PROGRAM: Access Code: [email or web address] URL: [email or web address] Date: 03/30/2023 Prepared by: Carlynn Herald  Exercises - Seated Hip Flexor Stretch  - 1 x daily - 7 x weekly - 1 sets - 3 reps - 20 seconds hold - Hip Abduction with Resistance Loop  - 1 x daily - 7 x weekly - 3 sets - 10 reps - Hip Extension with Resistance Loop  - 1 x daily - 7 x weekly - 3 sets - 10 reps - Standing Lumbar Extension with Counter  - 1 x daily - 7 x weekly - 3 sets - 10 reps - Sit to Stand Without Arm Support  - 1 x daily - 7 x weekly - 2 sets - 10 reps - Semi-Tandem Balance at [employer] Eyes Open  - 1 x daily - 7 x weekly - 3 sets - 10 reps - Semi-Tandem Balance at [employer] Eyes Closed  - 1 x daily - 7 x weekly - 3 sets - 10 reps - Prone on Elbows Stretch  - 1 x daily - 7 x weekly - 3 sets - 10 reps - Supine Bridge  - 1 x daily - 7 x weekly - 3 sets - 10 reps - Walking with Head Rotation  - 1 x daily - 7 x weekly - 3 sets - 10 reps - Side Stepping with Counter Support  - 1 x daily - 7 x weekly - 3 sets - 10 reps - Prone Press Up On Elbows  - 1 x daily - 7 x weekly - 1 sets - 10 reps - 20-30 sec hold - Single Leg Stance with Support  - 1 x daily - 7 x weekly - 1 sets - 5 reps - 20-30 hold  ASSESSMENT:  CLINICAL IMPRESSION:  Leroy Griffin required min A for gait on mat/uneven surface today. He has difficulty stopping during farmers carry due to forward momentum and decreased glute strength. Continued to work on trunk extension with deadlift  today.   GOALS: Goals reviewed with patient? Yes  SHORT TERM GOALS: Target date: 7/30/202  Leroy Griffin will be independent with initial HEP Baseline: Goal status: MET  LONG TERM GOALS: Target date: 06/01/2023  Leroy Griffin will be independent with advanced HEP Baseline:  Goal status: MET  2.  Leroy Griffin will improve bilat LE strength to 4+/5 to improve standing and walking tolerance Baseline: see above Goal status: IN PROGRESS  3.  Leroy Griffin will tolerate lifting 15# floor to chest with pain <= 1/10 Baseline:  Goal status: MET  4.  Leroy Griffin will tolerate standing x 20 minutes to perform leisure activities with pain <= 1/10 Baseline: 5/10 pain Goal status: IN PROGRESS  5. Leroy Griffin will demo improved balance by being able to perform gait without AD x 150' with independence  Goal status: INITIAL  6. Leroy Griffin will demo improved balance by perform tandem stance without UE support x 30 seconds bilat  Goal status: INITIAL   PLAN:  Leroy Griffin FREQUENCY: 2x/week  Leroy Griffin DURATION: 8 weeks  PLANNED INTERVENTIONS: Therapeutic exercises, Therapeutic activity, Neuromuscular re-education, Balance training, Gait training, Patient/Family education, Self Care, Joint mobilization, Aquatic Therapy, Dry Needling, Cryotherapy, Moist heat, Taping, Ionotophoresis 4mg /ml Dexamethasone, Manual therapy, and Re-evaluation.  PLAN FOR NEXT SESSION: updated/condense HEP, balance, strength PHYSICAL THERAPY DISCHARGE SUMMARY  Visits from Start of Care: 13  Current functional level related to goals / functional outcomes: Improving strength and balance   Remaining deficits: See above   Education / Equipment: HEP   Patient agrees to discharge. Patient goals were partially met. Patient is being discharged due to not returning since the last visit. Leroy Griffin, Leroy Griffin,DPT02/21/259:48 AM   Leroy Griffin, Leroy Griffin 05/17/2023, 12:30 PM

## 2023-05-19 DIAGNOSIS — R269 Unspecified abnormalities of gait and mobility: Secondary | ICD-10-CM | POA: Diagnosis not present

## 2023-06-06 ENCOUNTER — Ambulatory Visit: Payer: Medicare Other | Attending: Internal Medicine | Admitting: Internal Medicine

## 2023-06-06 ENCOUNTER — Encounter: Payer: Self-pay | Admitting: Internal Medicine

## 2023-06-06 VITALS — BP 105/63 | HR 72 | Resp 14 | Ht 71.75 in | Wt 209.0 lb

## 2023-06-06 DIAGNOSIS — M450 Ankylosing spondylitis of multiple sites in spine: Secondary | ICD-10-CM

## 2023-06-06 DIAGNOSIS — M4316 Spondylolisthesis, lumbar region: Secondary | ICD-10-CM | POA: Diagnosis not present

## 2023-06-06 DIAGNOSIS — M174 Other bilateral secondary osteoarthritis of knee: Secondary | ICD-10-CM

## 2023-06-06 NOTE — Progress Notes (Signed)
Office Visit Note  Patient: Leroy Griffin             Date of Birth: 20-Jun-1946           MRN: [id number]             PCP: Lucky Cowboy, MD Referring: Lucky Cowboy, MD Visit Date: 06/06/2023 Occupation: Former Comptroller, lawyer  Subjective:  New Patient (Initial Visit) (Patient states he usually has joint pain in his lower back and knees. )   History of Present Illness: Leroy Griffin is a 77 y.o. male here for evaluation of joint pain specifically concerning for possible axial spondyloarthritis with multilevel spondylosis changes and chronic back pain decreased mobility.  He has had very chronic low back pain issues seeing Dr. Danielle Dess since more than a decade ago with multilevel spondylosis and degenerative disc disease on lumbar spine MRI.  He had been recommended surgery for lumbar spine fusion at the time but declined as he could not deal with such a long recovery period at the time.  Also had some evaluation in 2019 with Dr. Otelia Sergeant but treated conservatively.  More recently evaluated in June with Dr. Karie Schwalbe after he sustained an acute worsening of low back pain after trying to turn and look to see out of a tractor.  He was treated with a course of prednisone that partially improved symptoms.  He had repeat x-ray that again redemonstrated particularly spondylosis of L4 on L5 and labs checked with positive HLA-B27. Besides the back pain also has chronic pain affecting both knees.  Previous history of right knee patellar fracture following which was pinned subsequently complicated by extrusion of his pin through the skin that required revision.  He was prescribed Celebrex for arthritis and with history of previous GI bleeding, as aspirin at that time.  Otherwise takes Tylenol and gets improvement with very low-dose tramadol as needed.   Activities of Daily Living:  Patient reports morning stiffness for less than 5 minutes.   Patient Denies nocturnal pain.  Difficulty  dressing/grooming: Denies Difficulty climbing stairs: Reports Difficulty getting out of chair: Reports Difficulty using hands for taps, buttons, cutlery, and/or writing: Denies  Review of Systems  Constitutional:  Positive for fatigue.  HENT:  Negative for mouth sores and mouth dryness.   Eyes:  Negative for dryness.  Respiratory:  Positive for shortness of breath.   Cardiovascular:  Negative for chest pain and palpitations.  Gastrointestinal:  Positive for constipation. Negative for blood in stool and diarrhea.  Endocrine: Negative for increased urination.  Genitourinary:  Negative for involuntary urination.  Musculoskeletal:  Positive for joint pain, gait problem, joint pain, muscle weakness and morning stiffness. Negative for joint swelling, myalgias, muscle tenderness and myalgias.  Skin:  Negative for color change, rash, hair loss and sensitivity to sunlight.  Allergic/Immunologic: Negative for susceptible to infections.  Neurological:  Negative for dizziness and headaches.  Hematological:  Negative for swollen glands.  Psychiatric/Behavioral:  Positive for sleep disturbance. Negative for depressed mood. The patient is not nervous/anxious.     PMFS History:  Patient Active Problem List   Diagnosis Date Noted   Other bilateral secondary osteoarthritis of knee 06/06/2023   Ankylosing spondylitis (HCC) 01/19/2023   Spondylolisthesis of lumbar region 01/17/2023   S/P placement of cardiac pacemaker 11/18/2020   Other abnormalities of gait and mobility 01/17/2019   Depression due to physical illness 01/17/2019   Overactive bladder 01/11/2018   S/P drug eluting coronary stent placement 11/21/2017  CAD in native artery 11/20/2017   Anxiety 11/14/2017   Overweight (BMI 25.0-29.9) 08/26/2014   Sick sinus syndrome (HCC) 10/10/2013   Medication management 10/06/2013   Hyperlipidemia 08/27/2013   Movement disorder    Proteinuria    Other abnormal glucose    Gout    Vitamin D  deficiency    Essential hypertension 10/10/2010    Past Medical History:  Diagnosis Date   CAD (coronary artery disease)    Essential tremor    Gastric ulcer 01/14/2015   Gout    Hyperlipidemia    Hypertension    Parkinson's disease (HCC)    Prediabetes    Proteinuria    Vitamin D deficiency     Family History  Problem Relation Age of Onset   Dementia Mother    Stroke Father    Emphysema Brother    Heart disease Brother    Past Surgical History:  Procedure Laterality Date   CHOLECYSTECTOMY     CYST EXCISION     back   KNEE SURGERY Right    PACEMAKER IMPLANT     TONSILLECTOMY     Social History   Social History Narrative   Not on file   Immunization History  Administered Date(s) Administered   Influenza Split 05/23/2013   Influenza, High Dose Seasonal PF 05/14/2014, 06/11/2015, 05/19/2016, 06/10/2017, 05/20/2019, 04/13/2023   Influenza-Unspecified 04/22/2020   PFIZER(Purple Top)SARS-COV-2 Vaccination 10/11/2019, 11/01/2019, 06/10/2020   Pneumococcal Conjugate-13 12/03/2014   Pneumococcal Polysaccharide-23 06/01/2010   Td 06/01/2010   Tdap 01/28/2014   Zoster, Live 08/08/2004     Objective: Vital Signs: BP 105/63 (BP Location: Right Arm, Patient Position: Sitting, Cuff Size: Normal)   Pulse 72   Resp 14   Ht 5' 11.75" (1.822 m)   Wt 209 lb (94.8 kg)   BMI 28.54 kg/m    Physical Exam Cardiovascular:     Rate and Rhythm: Normal rate and regular rhythm.  Pulmonary:     Effort: Pulmonary effort is normal.     Breath sounds: Normal breath sounds.  Musculoskeletal:     Right lower leg: No edema.     Left lower leg: No edema.  Skin:    General: Skin is warm and dry.  Neurological:     Mental Status: He is alert.  Psychiatric:        Mood and Affect: Mood normal.      Musculoskeletal Exam:  Elbows full ROM no tenderness or swelling Wrists full ROM no tenderness or swelling Fingers full ROM no tenderness or swelling Normal lumbar spine excursion  with forward bending, normal wall to tragus distance Right hip severely limited FADIR movement but not painful Right knee tenderness to pressure on medial joint line and pain provoked with flexion and extension  Investigation: No additional findings.  Imaging: No results found.  Recent Labs: Lab Results  Component Value Date   WBC 5.2 04/13/2023   HGB 13.7 04/13/2023   PLT 164 04/13/2023   NA 143 04/13/2023   K 5.1 04/13/2023   CL 106 04/13/2023   CO2 30 04/13/2023   GLUCOSE 101 (H) 04/13/2023   BUN 22 04/13/2023   CREATININE 1.23 04/13/2023   BILITOT 0.6 04/13/2023   ALKPHOS 50 12/01/2016   AST 28 04/13/2023   ALT 19 04/13/2023   PROT 6.3 04/13/2023   ALBUMIN 4.1 12/01/2016   CALCIUM 9.9 04/13/2023   GFRAA 74 08/18/2020    Speciality Comments: No specialty comments available.  Procedures:  No procedures performed Allergies:  Enalapril, Nsaids, Welchol [colesevelam hcl], and Vasotec [enalaprilat]   Assessment / Plan:     Visit Diagnoses: Ankylosing spondylitis of multiple sites in spine (HCC)  Spondylolisthesis of lumbar region - Plan: Sedimentation rate, C-reactive protein, C3 and C4  By my review of symptoms physical exam and imaging findings I could not confirm a diagnosis of ankylosing spondylitis.  Or at least does not appear to have active ongoing inflammation.  HE has positive HLA-B27 no other organ system involvement. There is no definite ankylosis on reviewed x-ray imaging and bony processes more consistent with endplate osteophytes versus syndesmophytes.  Does not describe typical inflammatory back pain we will check sed rate CRP and complements for evidence of systemic inflammation but I have a lower pretest suspicion for this.  If normal results would recommend him to continue follow-up with his existing providers.  Other bilateral secondary osteoarthritis of knee  Discussed his abnormal gait and leg mobility which I think is mostly due to having very  restricted hip range of motion.  Does have associated knee pain consistent with advanced medial compartment osteoarthritis, but range of motion and stability seems adequate. Discussed symptomatic management of osteoarthritis including NSAIDs low-dose pain medications or could be candidate for intraarticular injections.  Also provided printed handout on osteoarthritis supplement treatment options.  Orders: Orders Placed This Encounter  Procedures   Sedimentation rate   C-reactive protein   C3 and C4   No orders of the defined types were placed in this encounter.  .  Follow-Up Instructions: No follow-ups on file.   Fuller Plan, MD  Note - This record has been created using [employer].  Chart creation errors have been sought, but may not always  have been located. Such creation errors do not reflect on  the standard of medical care.

## 2023-06-07 LAB — C-REACTIVE PROTEIN: CRP: <3 mg/L (ref <8.0)

## 2023-06-07 LAB — C3 AND C4
C3 Complement: 124 mg/dL (ref 82–185)
C4 Complement: 20 mg/dL (ref 15–53)

## 2023-06-07 LAB — SEDIMENTATION RATE: Sed Rate: 2 mm/h (ref 0–20)

## 2023-06-07 NOTE — Progress Notes (Signed)
Sedimentation rate, CRP, and serum complements are normal.  Based on these results and from our exam I do not see any current evidence of systemic inflammation.  We could follow-up as needed if symptoms change significantly such as a large increase in joint swelling but otherwise should be okay to follow-up with his orthopedic provider.

## 2023-06-20 DIAGNOSIS — I2510 Atherosclerotic heart disease of native coronary artery without angina pectoris: Secondary | ICD-10-CM | POA: Diagnosis not present

## 2023-06-20 DIAGNOSIS — R351 Nocturia: Secondary | ICD-10-CM | POA: Diagnosis not present

## 2023-06-20 DIAGNOSIS — I129 Hypertensive chronic kidney disease with stage 1 through stage 4 chronic kidney disease, or unspecified chronic kidney disease: Secondary | ICD-10-CM | POA: Diagnosis not present

## 2023-06-20 DIAGNOSIS — E785 Hyperlipidemia, unspecified: Secondary | ICD-10-CM | POA: Diagnosis not present

## 2023-06-20 DIAGNOSIS — R809 Proteinuria, unspecified: Secondary | ICD-10-CM | POA: Diagnosis not present

## 2023-06-29 ENCOUNTER — Encounter: Payer: Self-pay | Admitting: Internal Medicine

## 2023-07-25 ENCOUNTER — Other Ambulatory Visit: Payer: Self-pay | Admitting: Internal Medicine

## 2023-07-25 DIAGNOSIS — G250 Essential tremor: Secondary | ICD-10-CM

## 2023-08-16 DIAGNOSIS — M65332 Trigger finger, left middle finger: Secondary | ICD-10-CM | POA: Diagnosis not present

## 2023-08-19 DIAGNOSIS — I495 Sick sinus syndrome: Secondary | ICD-10-CM | POA: Diagnosis not present

## 2023-09-12 ENCOUNTER — Encounter (INDEPENDENT_AMBULATORY_CARE_PROVIDER_SITE_OTHER): Payer: Self-pay | Admitting: Sports Medicine

## 2023-09-12 ENCOUNTER — Encounter: Payer: Medicare Other | Admitting: Internal Medicine

## 2023-09-12 DIAGNOSIS — N3281 Overactive bladder: Secondary | ICD-10-CM | POA: Diagnosis not present

## 2023-09-12 MED ORDER — MIRABEGRON ER 50 MG PO TB24: 50.0000 mg | ORAL_TABLET | Freq: Every day | ORAL | 11 refills | Status: AC

## 2023-09-12 NOTE — Telephone Encounter (Signed)

## 2023-09-12 NOTE — Assessment & Plan Note (Signed)
 Oxybutynin  tolerable, excessive dry mouth, multiple dental problems, difficulty with vision, mental clarity. For this reason we had started Myrbetriq  and he was to follow this up with his PCP. He did really well with Myrbetriq  but unfortunately his PCP passed away last week, and Myrbetriq  is no longer covered. I did inform the family that he would need to find a new PCP but I would go ahead and send in Myrbetriq  and we could try to run a PA.  For insurance purposes he has failed oxybutynin , and he will have intolerable and dangerous anticholinergic adverse effects with Vesicare and Detrol.

## 2023-09-20 DIAGNOSIS — R200 Anesthesia of skin: Secondary | ICD-10-CM | POA: Diagnosis not present

## 2023-09-25 DIAGNOSIS — Z1330 Encounter for screening examination for mental health and behavioral disorders, unspecified: Secondary | ICD-10-CM | POA: Diagnosis not present

## 2023-09-25 DIAGNOSIS — M549 Dorsalgia, unspecified: Secondary | ICD-10-CM | POA: Diagnosis not present

## 2023-10-31 DIAGNOSIS — H524 Presbyopia: Secondary | ICD-10-CM | POA: Diagnosis not present

## 2023-10-31 DIAGNOSIS — H25813 Combined forms of age-related cataract, bilateral: Secondary | ICD-10-CM | POA: Diagnosis not present

## 2023-10-31 DIAGNOSIS — H43813 Vitreous degeneration, bilateral: Secondary | ICD-10-CM | POA: Diagnosis not present

## 2023-10-31 DIAGNOSIS — H353132 Nonexudative age-related macular degeneration, bilateral, intermediate dry stage: Secondary | ICD-10-CM | POA: Diagnosis not present

## 2023-11-14 DIAGNOSIS — Z955 Presence of coronary angioplasty implant and graft: Secondary | ICD-10-CM | POA: Diagnosis not present

## 2023-11-14 DIAGNOSIS — G629 Polyneuropathy, unspecified: Secondary | ICD-10-CM | POA: Diagnosis not present

## 2023-11-14 DIAGNOSIS — I495 Sick sinus syndrome: Secondary | ICD-10-CM | POA: Diagnosis not present

## 2023-11-14 DIAGNOSIS — R269 Unspecified abnormalities of gait and mobility: Secondary | ICD-10-CM | POA: Diagnosis not present

## 2023-11-14 DIAGNOSIS — M47816 Spondylosis without myelopathy or radiculopathy, lumbar region: Secondary | ICD-10-CM | POA: Diagnosis not present

## 2023-11-14 DIAGNOSIS — K279 Peptic ulcer, site unspecified, unspecified as acute or chronic, without hemorrhage or perforation: Secondary | ICD-10-CM | POA: Diagnosis not present

## 2023-11-14 DIAGNOSIS — M450 Ankylosing spondylitis of multiple sites in spine: Secondary | ICD-10-CM | POA: Diagnosis not present

## 2023-11-14 DIAGNOSIS — E782 Mixed hyperlipidemia: Secondary | ICD-10-CM | POA: Diagnosis not present

## 2023-11-14 DIAGNOSIS — F5102 Adjustment insomnia: Secondary | ICD-10-CM | POA: Diagnosis not present

## 2023-11-14 DIAGNOSIS — G250 Essential tremor: Secondary | ICD-10-CM | POA: Diagnosis not present

## 2023-11-14 DIAGNOSIS — I10 Essential (primary) hypertension: Secondary | ICD-10-CM | POA: Diagnosis not present

## 2023-11-14 DIAGNOSIS — E559 Vitamin D deficiency, unspecified: Secondary | ICD-10-CM | POA: Diagnosis not present

## 2023-11-14 DIAGNOSIS — I480 Paroxysmal atrial fibrillation: Secondary | ICD-10-CM | POA: Diagnosis not present

## 2023-11-14 DIAGNOSIS — I2510 Atherosclerotic heart disease of native coronary artery without angina pectoris: Secondary | ICD-10-CM | POA: Diagnosis not present

## 2023-11-18 DIAGNOSIS — I495 Sick sinus syndrome: Secondary | ICD-10-CM | POA: Diagnosis not present

## 2023-11-18 DIAGNOSIS — Z950 Presence of cardiac pacemaker: Secondary | ICD-10-CM | POA: Diagnosis not present

## 2023-12-19 DIAGNOSIS — Z79899 Other long term (current) drug therapy: Secondary | ICD-10-CM | POA: Diagnosis not present

## 2023-12-19 DIAGNOSIS — R351 Nocturia: Secondary | ICD-10-CM | POA: Diagnosis not present

## 2023-12-19 DIAGNOSIS — I2510 Atherosclerotic heart disease of native coronary artery without angina pectoris: Secondary | ICD-10-CM | POA: Diagnosis not present

## 2023-12-19 DIAGNOSIS — R809 Proteinuria, unspecified: Secondary | ICD-10-CM | POA: Diagnosis not present

## 2023-12-19 DIAGNOSIS — I129 Hypertensive chronic kidney disease with stage 1 through stage 4 chronic kidney disease, or unspecified chronic kidney disease: Secondary | ICD-10-CM | POA: Diagnosis not present

## 2023-12-19 DIAGNOSIS — E785 Hyperlipidemia, unspecified: Secondary | ICD-10-CM | POA: Diagnosis not present

## 2024-01-16 DIAGNOSIS — L639 Alopecia areata, unspecified: Secondary | ICD-10-CM | POA: Diagnosis not present

## 2024-02-17 DIAGNOSIS — I495 Sick sinus syndrome: Secondary | ICD-10-CM | POA: Diagnosis not present

## 2024-02-17 DIAGNOSIS — Z950 Presence of cardiac pacemaker: Secondary | ICD-10-CM | POA: Diagnosis not present

## 2024-02-23 DIAGNOSIS — H25813 Combined forms of age-related cataract, bilateral: Secondary | ICD-10-CM | POA: Diagnosis not present

## 2024-02-29 DIAGNOSIS — H25813 Combined forms of age-related cataract, bilateral: Secondary | ICD-10-CM | POA: Diagnosis not present

## 2024-02-29 DIAGNOSIS — H02834 Dermatochalasis of left upper eyelid: Secondary | ICD-10-CM | POA: Diagnosis not present

## 2024-02-29 DIAGNOSIS — H40013 Open angle with borderline findings, low risk, bilateral: Secondary | ICD-10-CM | POA: Diagnosis not present

## 2024-02-29 DIAGNOSIS — I2510 Atherosclerotic heart disease of native coronary artery without angina pectoris: Secondary | ICD-10-CM | POA: Diagnosis not present

## 2024-02-29 DIAGNOSIS — I10 Essential (primary) hypertension: Secondary | ICD-10-CM | POA: Diagnosis not present

## 2024-02-29 DIAGNOSIS — H02831 Dermatochalasis of right upper eyelid: Secondary | ICD-10-CM | POA: Diagnosis not present

## 2024-02-29 DIAGNOSIS — K219 Gastro-esophageal reflux disease without esophagitis: Secondary | ICD-10-CM | POA: Diagnosis not present

## 2024-02-29 DIAGNOSIS — Z888 Allergy status to other drugs, medicaments and biological substances status: Secondary | ICD-10-CM | POA: Diagnosis not present

## 2024-02-29 DIAGNOSIS — H25811 Combined forms of age-related cataract, right eye: Secondary | ICD-10-CM | POA: Diagnosis not present

## 2024-02-29 DIAGNOSIS — H353131 Nonexudative age-related macular degeneration, bilateral, early dry stage: Secondary | ICD-10-CM | POA: Diagnosis not present

## 2024-02-29 DIAGNOSIS — H52223 Regular astigmatism, bilateral: Secondary | ICD-10-CM | POA: Diagnosis not present

## 2024-03-05 DIAGNOSIS — L639 Alopecia areata, unspecified: Secondary | ICD-10-CM | POA: Diagnosis not present

## 2024-03-05 DIAGNOSIS — L821 Other seborrheic keratosis: Secondary | ICD-10-CM | POA: Diagnosis not present

## 2024-03-11 DIAGNOSIS — I10 Essential (primary) hypertension: Secondary | ICD-10-CM | POA: Diagnosis not present

## 2024-03-11 DIAGNOSIS — Z950 Presence of cardiac pacemaker: Secondary | ICD-10-CM | POA: Diagnosis not present

## 2024-03-11 DIAGNOSIS — I495 Sick sinus syndrome: Secondary | ICD-10-CM | POA: Diagnosis not present

## 2024-03-14 DIAGNOSIS — I2510 Atherosclerotic heart disease of native coronary artery without angina pectoris: Secondary | ICD-10-CM | POA: Diagnosis not present

## 2024-03-14 DIAGNOSIS — Z886 Allergy status to analgesic agent status: Secondary | ICD-10-CM | POA: Diagnosis not present

## 2024-03-14 DIAGNOSIS — Z950 Presence of cardiac pacemaker: Secondary | ICD-10-CM | POA: Diagnosis not present

## 2024-03-14 DIAGNOSIS — H25812 Combined forms of age-related cataract, left eye: Secondary | ICD-10-CM | POA: Diagnosis not present

## 2024-03-14 DIAGNOSIS — M1009 Idiopathic gout, multiple sites: Secondary | ICD-10-CM | POA: Diagnosis not present

## 2024-03-14 DIAGNOSIS — K279 Peptic ulcer, site unspecified, unspecified as acute or chronic, without hemorrhage or perforation: Secondary | ICD-10-CM | POA: Diagnosis not present

## 2024-03-14 DIAGNOSIS — I480 Paroxysmal atrial fibrillation: Secondary | ICD-10-CM | POA: Diagnosis not present

## 2024-03-14 DIAGNOSIS — M450 Ankylosing spondylitis of multiple sites in spine: Secondary | ICD-10-CM | POA: Diagnosis not present

## 2024-03-14 DIAGNOSIS — E782 Mixed hyperlipidemia: Secondary | ICD-10-CM | POA: Diagnosis not present

## 2024-03-14 DIAGNOSIS — I10 Essential (primary) hypertension: Secondary | ICD-10-CM | POA: Diagnosis not present

## 2024-03-14 DIAGNOSIS — M1990 Unspecified osteoarthritis, unspecified site: Secondary | ICD-10-CM | POA: Diagnosis not present

## 2024-03-14 DIAGNOSIS — K219 Gastro-esophageal reflux disease without esophagitis: Secondary | ICD-10-CM | POA: Diagnosis not present

## 2024-03-15 DIAGNOSIS — E782 Mixed hyperlipidemia: Secondary | ICD-10-CM | POA: Diagnosis not present

## 2024-03-15 DIAGNOSIS — I10 Essential (primary) hypertension: Secondary | ICD-10-CM | POA: Diagnosis not present

## 2024-03-15 DIAGNOSIS — I480 Paroxysmal atrial fibrillation: Secondary | ICD-10-CM | POA: Diagnosis not present

## 2024-03-15 DIAGNOSIS — M1009 Idiopathic gout, multiple sites: Secondary | ICD-10-CM | POA: Diagnosis not present

## 2024-03-15 DIAGNOSIS — M47816 Spondylosis without myelopathy or radiculopathy, lumbar region: Secondary | ICD-10-CM | POA: Diagnosis not present

## 2024-03-15 DIAGNOSIS — Z125 Encounter for screening for malignant neoplasm of prostate: Secondary | ICD-10-CM | POA: Diagnosis not present

## 2024-03-25 DIAGNOSIS — I10 Essential (primary) hypertension: Secondary | ICD-10-CM | POA: Diagnosis not present

## 2024-03-25 DIAGNOSIS — R200 Anesthesia of skin: Secondary | ICD-10-CM | POA: Diagnosis not present

## 2024-04-09 ENCOUNTER — Encounter: Payer: Self-pay | Admitting: Sports Medicine

## 2024-04-09 DIAGNOSIS — N289 Disorder of kidney and ureter, unspecified: Secondary | ICD-10-CM | POA: Diagnosis not present

## 2024-04-09 DIAGNOSIS — R718 Other abnormality of red blood cells: Secondary | ICD-10-CM | POA: Diagnosis not present

## 2024-04-11 DIAGNOSIS — H43813 Vitreous degeneration, bilateral: Secondary | ICD-10-CM | POA: Diagnosis not present

## 2024-04-11 DIAGNOSIS — H524 Presbyopia: Secondary | ICD-10-CM | POA: Diagnosis not present

## 2024-04-11 DIAGNOSIS — H353132 Nonexudative age-related macular degeneration, bilateral, intermediate dry stage: Secondary | ICD-10-CM | POA: Diagnosis not present

## 2024-04-16 ENCOUNTER — Ambulatory Visit: Payer: Medicare Other | Admitting: Nurse Practitioner

## 2024-04-17 DIAGNOSIS — L639 Alopecia areata, unspecified: Secondary | ICD-10-CM | POA: Diagnosis not present

## 2024-05-15 DIAGNOSIS — Z950 Presence of cardiac pacemaker: Secondary | ICD-10-CM | POA: Diagnosis not present

## 2024-05-15 DIAGNOSIS — I10 Essential (primary) hypertension: Secondary | ICD-10-CM | POA: Diagnosis not present

## 2024-05-15 DIAGNOSIS — K279 Peptic ulcer, site unspecified, unspecified as acute or chronic, without hemorrhage or perforation: Secondary | ICD-10-CM | POA: Diagnosis not present

## 2024-05-15 DIAGNOSIS — I495 Sick sinus syndrome: Secondary | ICD-10-CM | POA: Diagnosis not present

## 2024-05-15 DIAGNOSIS — M7989 Other specified soft tissue disorders: Secondary | ICD-10-CM | POA: Diagnosis not present

## 2024-05-21 DIAGNOSIS — I10 Essential (primary) hypertension: Secondary | ICD-10-CM | POA: Diagnosis not present

## 2024-05-22 DIAGNOSIS — I10 Essential (primary) hypertension: Secondary | ICD-10-CM | POA: Diagnosis not present

## 2024-07-03 DIAGNOSIS — Z23 Encounter for immunization: Secondary | ICD-10-CM | POA: Diagnosis not present
